# Patient Record
Sex: Female | Born: 1935 | Race: White | Hispanic: No | Smoking: Never smoker
Health system: Southern US, Community
[De-identification: ages and names within clinical notes are randomized; demographics above are authoritative.]

## PROBLEM LIST (undated history)

## (undated) DIAGNOSIS — I219 Acute myocardial infarction, unspecified: Secondary | ICD-10-CM

## (undated) DIAGNOSIS — E039 Hypothyroidism, unspecified: Secondary | ICD-10-CM

## (undated) DIAGNOSIS — I214 Non-ST elevation (NSTEMI) myocardial infarction: Secondary | ICD-10-CM

## (undated) DIAGNOSIS — F419 Anxiety disorder, unspecified: Secondary | ICD-10-CM

## (undated) DIAGNOSIS — E7800 Pure hypercholesterolemia, unspecified: Secondary | ICD-10-CM

## (undated) DIAGNOSIS — K219 Gastro-esophageal reflux disease without esophagitis: Secondary | ICD-10-CM

## (undated) DIAGNOSIS — C4490 Unspecified malignant neoplasm of skin, unspecified: Secondary | ICD-10-CM

## (undated) DIAGNOSIS — I2510 Atherosclerotic heart disease of native coronary artery without angina pectoris: Secondary | ICD-10-CM

## (undated) DIAGNOSIS — M1990 Unspecified osteoarthritis, unspecified site: Secondary | ICD-10-CM

## (undated) DIAGNOSIS — J189 Pneumonia, unspecified organism: Secondary | ICD-10-CM

## (undated) DIAGNOSIS — R0602 Shortness of breath: Secondary | ICD-10-CM

## (undated) DIAGNOSIS — I10 Essential (primary) hypertension: Secondary | ICD-10-CM

## (undated) HISTORY — PX: SKIN CANCER EXCISION: SHX779

## (undated) HISTORY — PX: BREAST BIOPSY: SHX20

## (undated) HISTORY — DX: Non-ST elevation (NSTEMI) myocardial infarction: I21.4

## (undated) HISTORY — PX: CATARACT EXTRACTION W/ INTRAOCULAR LENS  IMPLANT, BILATERAL: SHX1307

## (undated) HISTORY — PX: DILATION AND CURETTAGE OF UTERUS: SHX78

---

## 1998-04-15 ENCOUNTER — Other Ambulatory Visit: Admission: RE | Admit: 1998-04-15 | Discharge: 1998-04-15 | Payer: Self-pay | Admitting: Family Medicine

## 2000-03-01 ENCOUNTER — Other Ambulatory Visit: Admission: RE | Admit: 2000-03-01 | Discharge: 2000-03-01 | Payer: Self-pay | Admitting: General Surgery

## 2001-03-13 ENCOUNTER — Encounter: Admission: RE | Admit: 2001-03-13 | Discharge: 2001-03-13 | Payer: Self-pay | Admitting: Family Medicine

## 2001-03-13 ENCOUNTER — Encounter: Payer: Self-pay | Admitting: Family Medicine

## 2001-11-27 HISTORY — PX: APPENDECTOMY: SHX54

## 2002-09-02 ENCOUNTER — Encounter: Payer: Self-pay | Admitting: General Surgery

## 2002-09-02 ENCOUNTER — Encounter (INDEPENDENT_AMBULATORY_CARE_PROVIDER_SITE_OTHER): Payer: Self-pay | Admitting: Specialist

## 2002-09-02 ENCOUNTER — Inpatient Hospital Stay (HOSPITAL_COMMUNITY): Admission: EM | Admit: 2002-09-02 | Discharge: 2002-09-04 | Payer: Self-pay

## 2008-04-01 ENCOUNTER — Encounter: Admission: RE | Admit: 2008-04-01 | Discharge: 2008-04-01 | Payer: Self-pay | Admitting: Family Medicine

## 2010-11-27 DIAGNOSIS — J189 Pneumonia, unspecified organism: Secondary | ICD-10-CM

## 2010-11-27 HISTORY — DX: Pneumonia, unspecified organism: J18.9

## 2011-04-14 NOTE — Op Note (Signed)
NAME:  Kristin Shaffer, Kristin Shaffer                         ACCOUNT NO.:  [id number]   MEDICAL RECORD NO.:  [id number]                   PATIENT TYPE:  INP   LOCATION:  1823                                 FACILITY:  [facility]   PHYSICIAN:  Sharlet Salina T. Hoxworth, M.D.          DATE OF BIRTH:  Jan 20, 1931   DATE OF PROCEDURE:  09/02/2002  DATE OF DISCHARGE:                                 OPERATIVE REPORT   PREOPERATIVE DIAGNOSIS:  Acute appendicitis.   POSTOPERATIVE DIAGNOSIS:  Acute appendicitis.   SURGICAL PROCEDURE:  Laparoscopic appendectomy.   SURGEON:  Lorne Skeens. Hoxworth, M.D.   ANESTHESIA:  General.   BRIEF HISTORY:  The patient is a 77 year old white female who presents with  24 hours of worsening periumbilical and right lower quadrant abdominal pain.  A CT scan was obtained in the emergency room which confirms acute  appendicitis.  Laparoscopic appendectomy has been recommended and accepted.  The procedure, its indications, risks of bleeding, infection and possible  need for open procedure were discussed and understood preoperatively.  She  is now brought to the operating room for this procedure.   DESCRIPTION OF PROCEDURE:  The patient was brought to the operating room and  placed in the supine position on the operation table and general  endotracheal anesthesia was induced.  She had received preoperative  antibiotics.  The abdomen was sterilely prepped and draped.  Local  anesthesia was used to infiltrate the trocar sites prior to the incision.   A 1-cm incision was made in the umbilicus and dissection was carried down to  the midline fascia.  This was sharply incised for 1 cm and the peritoneum  entered under direct vision.  Using mattress suture of 0 Vicryl, the Hasson  trocar was placed and pneumoperitoneum established.  Under direct vision, a  5-mm trocar was placed in the right upper quadrant and a 12-mm trocar in the  left lower quadrant.  The cecum was visualized and there  were some adhesions  to the lateral abdominal wall.  The base of the appendix could be visualized  and extended around laterally and was acutely inflamed.  The cecum was  mobilized dividing the lateral adhesions.  This allowed mobilization of the  cecum and exposure of the appendix which was acutely inflamed.  It has early  gangrenous changes.  The appendix was carefully bluntly dissected away from  the cecum where it was adhered with inflammatory adhesions and was able to  be elevated and the mesoappendix and the base clearly exposed.  The  mesoappendix was then divided with the harmonic scalpel sequentially down to  the base which was completely freed.  There was minimal inflammation at the  base.  The appendix was divided at its base with a single fire of the Endo-  GIA 3.5-mm stapler with a secure staple line and no evidence of bleeding.  The appendix was placed in an EndoCatch bag and brought  out through the  umbilicus.   Inspection of the trocar sites revealed there was some bleeding from the  left lower quadrant 12-mm trocar.  The trocars were removed and there was  actually a small amount of arterial bleeding probably from the inferior  epigastric or a branch.  Several 0 Vicryl suture were placed through the  abdominal wall and fascial defect with the EndoClose with complete cessation  of the bleeding.  There were a several centimeter hematoma beneath the  peritoneum but this was observed for some time and was stable and there was  no evidence of further bleeding.  The abdomen was suctioned and irrigated  until clear and hemostasis assured.  The rest of the trocars were removed  under direct vision and the C02 evacuated from the peritoneal cavity and the  mattress suture secured at the umbilicus.  The skin incisions were closed  with interrupted subcuticular 4-0 Vicryl and Steri-Strips.  The sponge,  needle and instrument counts were correct.  Dry dressings were applied and  the  patient was taken to the recovery room in good condition.                                               Lorne Skeens. Hoxworth, M.D.    Tory Emerald  D:  09/02/2002  T:  09/03/2002  Job:  [id number]

## 2013-03-20 ENCOUNTER — Encounter (HOSPITAL_COMMUNITY)
Admission: AD | Disposition: A | Discharge status: Home / Self Care | Payer: Self-pay | Source: Ambulatory Visit | Attending: Internal Medicine

## 2013-03-20 ENCOUNTER — Inpatient Hospital Stay (HOSPITAL_COMMUNITY)
Admission: AD | Admit: 2013-03-20 | Discharge: 2013-03-22 | DRG: 247 | Disposition: A | Discharge status: Home / Self Care | Payer: Medicare Other | Source: Ambulatory Visit | Attending: Internal Medicine | Admitting: Internal Medicine

## 2013-03-20 ENCOUNTER — Encounter (HOSPITAL_COMMUNITY): Payer: Self-pay | Admitting: General Practice

## 2013-03-20 DIAGNOSIS — I10 Essential (primary) hypertension: Secondary | ICD-10-CM | POA: Diagnosis present

## 2013-03-20 DIAGNOSIS — Z955 Presence of coronary angioplasty implant and graft: Secondary | ICD-10-CM

## 2013-03-20 DIAGNOSIS — I200 Unstable angina: Secondary | ICD-10-CM | POA: Diagnosis present

## 2013-03-20 DIAGNOSIS — E039 Hypothyroidism, unspecified: Secondary | ICD-10-CM | POA: Diagnosis present

## 2013-03-20 DIAGNOSIS — I214 Non-ST elevation (NSTEMI) myocardial infarction: Secondary | ICD-10-CM | POA: Diagnosis not present

## 2013-03-20 DIAGNOSIS — E876 Hypokalemia: Secondary | ICD-10-CM | POA: Diagnosis present

## 2013-03-20 DIAGNOSIS — K219 Gastro-esophageal reflux disease without esophagitis: Secondary | ICD-10-CM | POA: Diagnosis present

## 2013-03-20 DIAGNOSIS — I160 Hypertensive urgency: Secondary | ICD-10-CM | POA: Diagnosis present

## 2013-03-20 DIAGNOSIS — E785 Hyperlipidemia, unspecified: Secondary | ICD-10-CM | POA: Diagnosis present

## 2013-03-20 HISTORY — DX: Acute myocardial infarction, unspecified: I21.9

## 2013-03-20 HISTORY — DX: Atherosclerotic heart disease of native coronary artery without angina pectoris: I25.10

## 2013-03-20 HISTORY — DX: Non-ST elevation (NSTEMI) myocardial infarction: I21.4

## 2013-03-20 HISTORY — DX: Shortness of breath: R06.02

## 2013-03-20 HISTORY — DX: Pneumonia, unspecified organism: J18.9

## 2013-03-20 HISTORY — DX: Gastro-esophageal reflux disease without esophagitis: K21.9

## 2013-03-20 HISTORY — DX: Pure hypercholesterolemia, unspecified: E78.00

## 2013-03-20 HISTORY — PX: LEFT HEART CATHETERIZATION WITH CORONARY ANGIOGRAM: SHX5451

## 2013-03-20 HISTORY — DX: Anxiety disorder, unspecified: F41.9

## 2013-03-20 HISTORY — DX: Essential (primary) hypertension: I10

## 2013-03-20 HISTORY — DX: Unspecified malignant neoplasm of skin, unspecified: C44.90

## 2013-03-20 HISTORY — PX: CORONARY ANGIOPLASTY WITH STENT PLACEMENT: SHX49

## 2013-03-20 HISTORY — DX: Unspecified osteoarthritis, unspecified site: M19.90

## 2013-03-20 HISTORY — DX: Hypothyroidism, unspecified: E03.9

## 2013-03-20 LAB — BASIC METABOLIC PANEL
BUN: 17 mg/dL (ref 6–23)
Chloride: 102 mEq/L (ref 96–112)
GFR calc Af Amer: 77 mL/min — ABNORMAL LOW (ref >90)
GFR calc non Af Amer: 67 mL/min — ABNORMAL LOW (ref >90)
Glucose, Bld: 98 mg/dL (ref 70–99)
Potassium: 3.6 mEq/L (ref 3.5–5.1)
Sodium: 142 mEq/L (ref 135–145)

## 2013-03-20 LAB — POCT ACTIVATED CLOTTING TIME: Activated Clotting Time: 628 seconds

## 2013-03-20 LAB — TROPONIN I: Troponin I: 1.59 ng/mL (ref <0.30)

## 2013-03-20 LAB — CBC
Platelets: 188 10*3/uL (ref 150–400)
RDW: 13.7 % (ref 11.5–15.5)
WBC: 5.8 10*3/uL (ref 4.0–10.5)

## 2013-03-20 LAB — PROTIME-INR: INR: 0.94 (ref 0.00–1.49)

## 2013-03-20 SURGERY — LEFT HEART CATHETERIZATION WITH CORONARY ANGIOGRAM
Anesthesia: LOCAL

## 2013-03-20 MED ORDER — NITROGLYCERIN IN D5W 200-5 MCG/ML-% IV SOLN
INTRAVENOUS | Status: AC
  Filled 2013-03-20: qty 250

## 2013-03-20 MED ORDER — TICAGRELOR 90 MG PO TABS
ORAL_TABLET | ORAL | Status: AC
  Filled 2013-03-20: qty 2

## 2013-03-20 MED ORDER — SODIUM CHLORIDE 0.9 % IV SOLN: 250.0000 mL | INTRAVENOUS | Status: DC | PRN

## 2013-03-20 MED ORDER — SODIUM CHLORIDE 0.9 % IV SOLN: INTRAVENOUS | Status: DC

## 2013-03-20 MED ORDER — ASPIRIN 300 MG RE SUPP
300.0000 mg | RECTAL | Status: AC
  Filled 2013-03-20: qty 1

## 2013-03-20 MED ORDER — ASPIRIN EC 81 MG PO TBEC
81.0000 mg | DELAYED_RELEASE_TABLET | Freq: Every day | ORAL | Status: DC
  Administered 2013-03-21: 81 mg via ORAL
  Filled 2013-03-20 (×3): qty 1

## 2013-03-20 MED ORDER — LIDOCAINE HCL (PF) 1 % IJ SOLN
INTRAMUSCULAR | Status: AC
  Filled 2013-03-20: qty 30

## 2013-03-20 MED ORDER — POTASSIUM CHLORIDE CRYS ER 20 MEQ PO TBCR
20.0000 meq | EXTENDED_RELEASE_TABLET | Freq: Every day | ORAL | Status: DC
  Administered 2013-03-21: 11:00:00 20 meq via ORAL
  Filled 2013-03-20 (×2): qty 1

## 2013-03-20 MED ORDER — METOPROLOL TARTRATE 12.5 MG HALF TABLET
12.5000 mg | ORAL_TABLET | Freq: Two times a day (BID) | ORAL | Status: DC
  Administered 2013-03-20 – 2013-03-21 (×3): 12.5 mg via ORAL
  Filled 2013-03-20 (×6): qty 1

## 2013-03-20 MED ORDER — PANTOPRAZOLE SODIUM 40 MG PO TBEC
40.0000 mg | DELAYED_RELEASE_TABLET | Freq: Every day | ORAL | Status: DC
  Administered 2013-03-21: 11:00:00 40 mg via ORAL
  Filled 2013-03-20: qty 1

## 2013-03-20 MED ORDER — VERAPAMIL HCL 2.5 MG/ML IV SOLN
INTRAVENOUS | Status: AC
  Filled 2013-03-20: qty 2

## 2013-03-20 MED ORDER — LEVOTHYROXINE SODIUM 88 MCG PO TABS
88.0000 ug | ORAL_TABLET | Freq: Every day | ORAL | Status: DC
  Administered 2013-03-21 – 2013-03-22 (×2): 88 ug via ORAL
  Filled 2013-03-20 (×7): qty 1

## 2013-03-20 MED ORDER — ACETAMINOPHEN 325 MG PO TABS: 650.0000 mg | ORAL_TABLET | ORAL | Status: DC | PRN

## 2013-03-20 MED ORDER — SODIUM CHLORIDE 0.9 % IJ SOLN: 3.0000 mL | INTRAMUSCULAR | Status: DC | PRN

## 2013-03-20 MED ORDER — ASPIRIN 81 MG PO CHEW
324.0000 mg | CHEWABLE_TABLET | ORAL | Status: AC
  Administered 2013-03-20: 324 mg via ORAL
  Filled 2013-03-20: qty 4

## 2013-03-20 MED ORDER — HEPARIN (PORCINE) IN NACL 2-0.9 UNIT/ML-% IJ SOLN
INTRAMUSCULAR | Status: AC
  Filled 2013-03-20: qty 1000

## 2013-03-20 MED ORDER — FENTANYL CITRATE 0.05 MG/ML IJ SOLN
INTRAMUSCULAR | Status: AC
  Filled 2013-03-20: qty 2

## 2013-03-20 MED ORDER — NITROGLYCERIN IN D5W 200-5 MCG/ML-% IV SOLN: 20.0000 ug/min | INTRAVENOUS | Status: DC

## 2013-03-20 MED ORDER — MAGNESIUM OXIDE 250 MG PO TABS: 1.0000 | ORAL_TABLET | Freq: Every day | ORAL | Status: DC

## 2013-03-20 MED ORDER — SODIUM CHLORIDE 0.9 % IV SOLN: 1.0000 mL/kg/h | INTRAVENOUS | Status: AC

## 2013-03-20 MED ORDER — HEPARIN (PORCINE) IN NACL 100-0.45 UNIT/ML-% IJ SOLN
1100.0000 [IU]/h | INTRAMUSCULAR | Status: DC
  Administered 2013-03-20: 1100 [IU]/h via INTRAVENOUS
  Filled 2013-03-20: qty 250

## 2013-03-20 MED ORDER — BIVALIRUDIN 250 MG IV SOLR
INTRAVENOUS | Status: AC
  Filled 2013-03-20: qty 250

## 2013-03-20 MED ORDER — HYDRALAZINE HCL 20 MG/ML IJ SOLN: 10.0000 mg | Freq: Four times a day (QID) | INTRAMUSCULAR | Status: DC | PRN

## 2013-03-20 MED ORDER — SODIUM CHLORIDE 0.9 % IJ SOLN: 3.0000 mL | Freq: Two times a day (BID) | INTRAMUSCULAR | Status: DC

## 2013-03-20 MED ORDER — MIDAZOLAM HCL 2 MG/2ML IJ SOLN
INTRAMUSCULAR | Status: AC
  Filled 2013-03-20: qty 2

## 2013-03-20 MED ORDER — SODIUM CHLORIDE 0.9 % IV SOLN
0.2500 mg/kg/h | INTRAVENOUS | Status: DC
  Filled 2013-03-20: qty 250

## 2013-03-20 MED ORDER — ONDANSETRON HCL 4 MG/2ML IJ SOLN
4.0000 mg | Freq: Four times a day (QID) | INTRAMUSCULAR | Status: DC | PRN
  Administered 2013-03-21: 04:00:00 4 mg via INTRAVENOUS
  Filled 2013-03-20: qty 2

## 2013-03-20 MED ORDER — NITROGLYCERIN IN D5W 200-5 MCG/ML-% IV SOLN
2.0000 ug/min | INTRAVENOUS | Status: DC
  Administered 2013-03-20: 20 ug/min via INTRAVENOUS
  Administered 2013-03-21: 11:00:00 5 ug/min via INTRAVENOUS
  Filled 2013-03-20: qty 250

## 2013-03-20 MED ORDER — ALPRAZOLAM 0.25 MG PO TABS
0.2500 mg | ORAL_TABLET | ORAL | Status: DC | PRN
  Administered 2013-03-20: 21:00:00 0.25 mg via ORAL
  Filled 2013-03-20: qty 1

## 2013-03-20 MED ORDER — MAGNESIUM OXIDE 400 (241.3 MG) MG PO TABS
200.0000 mg | ORAL_TABLET | Freq: Every day | ORAL | Status: DC
  Administered 2013-03-21: 200 mg via ORAL
  Filled 2013-03-20 (×2): qty 0.5

## 2013-03-20 MED ORDER — HEPARIN BOLUS VIA INFUSION
4000.0000 [IU] | Freq: Once | INTRAVENOUS | Status: AC
  Administered 2013-03-20: 4000 [IU] via INTRAVENOUS
  Filled 2013-03-20: qty 4000

## 2013-03-20 MED ORDER — HYDROCHLOROTHIAZIDE 25 MG PO TABS
25.0000 mg | ORAL_TABLET | Freq: Every day | ORAL | Status: DC
  Filled 2013-03-20: qty 1

## 2013-03-20 MED ORDER — TICAGRELOR 90 MG PO TABS
90.0000 mg | ORAL_TABLET | Freq: Two times a day (BID) | ORAL | Status: DC
  Administered 2013-03-20 – 2013-03-21 (×3): 90 mg via ORAL
  Filled 2013-03-20 (×6): qty 1

## 2013-03-20 MED ORDER — ASPIRIN 81 MG PO CHEW: 324.0000 mg | CHEWABLE_TABLET | ORAL | Status: DC

## 2013-03-20 MED ORDER — HEPARIN SODIUM (PORCINE) 1000 UNIT/ML IJ SOLN
INTRAMUSCULAR | Status: AC
  Filled 2013-03-20: qty 1

## 2013-03-20 MED ORDER — SODIUM CHLORIDE 0.9 % IJ SOLN
3.0000 mL | Freq: Two times a day (BID) | INTRAMUSCULAR | Status: DC
  Administered 2013-03-21: 3 mL via INTRAVENOUS

## 2013-03-20 MED ORDER — ATORVASTATIN CALCIUM 20 MG PO TABS
20.0000 mg | ORAL_TABLET | Freq: Every day | ORAL | Status: DC
  Filled 2013-03-20 (×3): qty 1

## 2013-03-20 NOTE — CV Procedure (Addendum)
[facility] PERCUTANEOUS CORONARY INTERVENTION REPORT  NAME:  DHRUVI CRENSHAW   MRN: [id number] DOB:  December 04, 1930   ADMIT DATE: 03/20/2013 Procedure Date: 03/20/2013  INTERVENTIONAL CARDIOLOGIST: Marykay Lex, M.D., MS PRIMARY CARE PROVIDER: Delorse Lek, MD PRIMARY CARDIOLOGIST: Kenneth C. ([place]) Rennis Golden, M.D.  PATIENT:  Kristin Shaffer is a 79 y.o. female with a past medical history significant for hyperlipdemia, hypothyroidism, and apparently new onset hypertension. She was seen 2 days ago by Dr. Doristine Counter for complaints of worsening chest pain with exertion. She reports the chest pain is becoming on and off since Friday. The pain is substernal and occasionally radiates down both arms. The pain was improved with rest. She was referred for cardiac catheterization after being evaluated with Dr. Royann Shivers.  PRE-OPERATIVE DIAGNOSIS:    Unstable Angina   PROCEDURES PERFORMED:    Complex, difficult Percutaneous Coronary Intervention of the long segment of severely diseased mid portion of the RCA using 3 overlapping Xience Expedition Drug-Eluting Stents; 2.75 mm x 18 mm, 3.0 mm 18 mm, 3.0 mm 15 mm  Left Heart Catheterization for hemodynamic measurement.  PROCEDURE:Consent:  Risks of procedure as well as the alternatives and risks of each were explained to the (patient/caregiver).  Consent for procedure obtained. Consent for signed by MD and patient with RN witness -- placed on chart.  PROCEDURE: Time Out: Verified patient identification, verified procedure, site/side was marked, verified correct patient position, special equipment/implants available, medications/allergies/relevent history reviewed, required imaging and test results available.  Performed for new performing physician.  MEDICATIONS:  Total Sedation for PCI and intervention:  2 mg IV Versed, 100 mcg IV fentanyl ;   Omnipaque Contrast: Total for diagnostic and PCI -- 225 ml  Anticoagulation:  IV Heparin  3500 Units (for diagnostic) ; Angiomax Bolus & drip prior to initiating PCI  Anti-Platelet Agent:  Ticagrelor 180 mg  Hemodynamics:  Central Aortic / Mean Pressures: 164/69 mmHg; mean 103 mmHg  Left Ventricular Pressure: 168/9 mmHg, LVEDP 11 mmHg  Left Ventriculography: Not performed to conserve contrast  Coronary Anatomy:  See diagnostic catheterization report by Dr. Royann Shivers.  RCA: Large size, dominant vessel. After the first bend there is a tapering 40-60% stenosis followed by a near total occlusion with a 95-99% stenosis (at the site of the smaller moderate caliber RV marginal branch takeoff) followed by but brief normal segment and then a tandem 95% stenosis after the second bend. Following this vessel normalizes, and remained angiographic normal until it bifurcates distally into the RPDA and the  Right Posterior AV Groove Branch (RPAV).   After review of the diagnostic angiography, it was clear that the tandem lesions in the RCA were the culprit lesions. With the tapering proximal disease as felt necessary to cover this region as well. Due to the extent and length of the stenosed segment, the decision was made to use drug-eluting stents. Overall the procedure was very difficult due to the tilting with guide backup, the proximal stenoses in calcification making it difficult to pass predilatation balloons and stents. Several attempts are made to use buddy wires are unsuccessful and repeat predilatation was required in order to pass the second and third stents beyond the less severe proximal tubular/tapering stenosis.  Percutaneous Coronary Intervention:   the existing sheath was a 6 Field seismologist in the right Radial Artery.  Guide: 6 Fr    JR 4 Guidewire:  Pro-Water; additional wires attentive used for buddy wire were a Luge and an BMW wire.  Predilation Balloon: Emerge   2.0 mm x 20 mm;  multiple inflations beginning from the most distal 95% lesion back to the proximal  tubular stenosis site.  10 Atm x 30  Sec for 2 inflations at each of the 95+ percent stenoses   8 Atm x 30  Sec for 2 inflations in a more proximal segment. Stent #1: Xience Expedition 2.75 mm x 18 mm;   Deployed at 14 Atm x 30 Sec,   Inflated at the ostium into the proximal 95-99% lesion:  12 Atm x 30 Sec  Final distal diameter: 2.9 mm  At this point a second stent, Xience Expedition 3.0 mm x 33 mm was on 6 sec slowly advanced into the mid RCA.  Several wires were attempted to pass through the distal stent to allow for buddy wire support, but were unsuccessful.  The intention was to overlap of the distal stent and cover the entire remaining segment with one single stent. The predilatation balloon was then reinserted and 2 inflations were made: Predilation Balloon: Emerge   2.0 mm x 20 mm  10 Atm x 30  Sec   The 3.0 mm x 33 mm stent was then again unsuccessfully advanced into the RCA. At this time the decision was made to use and noncompliant balloon to post dilate the distal stent and predilated the upstream lesions.   Post/Predilation Balloon #2: [place] Quantum Apex 2.75 mm x 15 mm  12 Atm x 30  Sec -- in the proximal 2/3 of the stent  8 Atm x 30 -- at the focal 95-90% stenosis  At this point a focal dissection was noted at that lesion but not extending proximal or distal.  Another unsuccessful attempt to advance the long 3.0 mm x 33 mm was made. At this point the decision was made to attempt to cover the area with 2 additional overlapping stents.   Stent #2: Xience Expedition 3.0 mm x 18 mm; overlapping the distal stent.  Deployed at 12 Atm x 30  Sec  Advanced to cover the entire overlapping segment - 12 Atm x 30  Sec  Pullback into the stent #2 for post dilation with stent balloon : 14 Atm x 40 Sec,   Stent #3: Xience Expedition 3.0 mm x 15 mm;   Deployed at 12 Atm x 30 Sec,  Stent balloon advanced into the second overlapping segment:  18 Atm x 45 Sec  Post-dilation  Balloon: Empira [place]   3.25 mm x 12 mm;  first 3 inflations are from distal to proximal covering the proximal half of Stent #1 up to Stent #3.  18 Atm x 45  Sec, 20 Atm x 45 Sec (at the proximal 95-99% site); 18 Atm x 30 Sec  2 additional inflations at 18 Atm x 30 followed by a second inflation at  20 Atm x 45 Sec were made to the stented segment.  Final Diameter: Distal stent - 2.9 mm; proximal third of Stent # to Stent #3 - 3.3 mm  Post deployment angiography in multiple views, with and without guidewire in place revealed excellent stent deployment and lesion coverage.  There was no evidence of dissection or perforation.  After completion of PCI, the guide catheter exchanged over a long exchange safety J-wire for the JR 4 catheter that was used to cross Aortic Valve for measurement of Left Ventricular Hemodynamics and Aortic Valve pullback. The catheters and removed via completely out of the body over wire.  The sheath was removed  with a TR band applied at 1600 hours using 16 mL air.  Reverse Allen's test with plethysmography revealed nonocclusive hemostasis.  PATIENT DISPOSITION:    The patient was transferred to the PACU holding area in a hemodynamicaly stable, chest pain free condition.  The patient tolerated the procedure well, and there were no complications.  EBL:   < 15 ml  The patient was stable before, during, and after the procedure.  POST-OPERATIVE DIAGNOSIS:    Successful, difficult/complex Percutaneous Coronary Intervention of the entire mid RCA encompassing a long tubular 40-60% lesion that preceded 2 tandem 95-99% subtotal occlusion lesions using 3 overlapping Xience Expedition Drug Eluting Stents -- 2.75 mm x 18 mm, 3.0 mm x 18 mm, 3.0 mm x 15 mm postdilated in a tapered fashion as described above.    Normal left ventricular pressures.  PLAN OF CARE:  Standard post radial catheterization care.    Will continue IV Angiomax at reduced rate for 3 additional hours post  PCI.   DUE to the mild elevation of troponin would anticipate discharge in 2 days.  Dual Antiplatelet Therapy for a minimum of 1 year. Will begin with Ticagrelor and low-dose aspirin for least the first month.  After this time would consider potentially converting to Plavix.   Marykay Lex, M.D., M.S. [facility] [address] [place], [place]  [address]  [phone]  03/20/2013 4:37 PM

## 2013-03-20 NOTE — H&P (Signed)
[facility]            ADMISSION HISTORY & PHYSICAL   Chief Complaint:  Chest pain, hypertension  Cardiologist: Hilty  Primary Care Physician: Delorse Lek, MD  HPI:  This is a 79 y.o. female with a past medical history significant for hyperlipdemia, hypothyroidism, and apparently new onset hypertension.  She was seen 2 days ago by Dr. Doristine Counter for complaints of worsening chest pain with exertion. She reports the chest pain is becoming on and off since Friday.  The pain is substernal and occasionally radiates down both arms. The pain was improved with rest.  She was referred for evaluation of this as additional treatment for reflux did not improve her symptoms.  PMHx:  Past Medical History  Diagnosis Date  . Hypertension   . GERD (gastroesophageal reflux disease)   . Hypothyroidism   . Pneumonia     hx of pna  . Cancer     hx of skin cancer  . Arthritis     Past Surgical History  Procedure Laterality Date  . Breast biopsy    . Appendectomy  2003    FAMHx:  History reviewed. No pertinent family history.  SOCHx:   reports that she has never smoked. She has never used smokeless tobacco. She reports that she does not drink alcohol or use illicit drugs.  ALLERGIES:  Allergies  Allergen Reactions  . Latex Hives and Rash    ROS: A comprehensive review of systems was negative except for: Cardiovascular: positive for chest pain and fatigue  HOME MEDS: Medications Prior to Admission  Medication Sig Dispense Refill  . acetic acid-hydrocortisone (VOSOL-HC) otic solution Place 3-4 drops into both ears 4 (four) times daily.      Marland Kitchen aspirin 81 MG chewable tablet Chew 81 mg by mouth daily.      Marland Kitchen atorvastatin (LIPITOR) 20 MG tablet Take 20 mg by mouth daily.      . calcium-vitamin D (OSCAL WITH D) 500-200 MG-UNIT per tablet Take 1 tablet by mouth daily.      . hydrochlorothiazide (HYDRODIURIL) 25 MG tablet Take 25 mg by mouth daily.      Marland Kitchen  levothyroxine (SYNTHROID, LEVOTHROID) 88 MCG tablet Take 88 mcg by mouth daily before breakfast.      . Magnesium Oxide 250 MG TABS Take 1 tablet by mouth daily.      Marland Kitchen omeprazole (PRILOSEC) 20 MG capsule Take 20 mg by mouth daily.      . potassium chloride SA (K-DUR,KLOR-CON) 20 MEQ tablet Take 20 mEq by mouth daily.        LABS/IMAGING: Results for orders placed during the hospital encounter of 03/20/13 (from the past 48 hour(s))  CBC     Status: None   Collection Time    03/20/13 11:55 AM      Result Value Range   WBC 5.8  4.0 - 10.5 K/uL   RBC 4.79  3.87 - 5.11 MIL/uL   Hemoglobin 14.7  12.0 - 15.0 g/dL   HCT 96.0  45.4 - 09.8 %   MCV 86.8  78.0 - 100.0 fL   MCH 30.7  26.0 - 34.0 pg   MCHC 35.3  30.0 - 36.0 g/dL   RDW 11.9  14.7 - 82.9 %   Platelets 188  150 - 400 K/uL  TROPONIN I     Status: Abnormal   Collection Time    03/20/13 11:55 AM      Result Value  Range   Troponin I 0.67 (*) <0.30 ng/mL   Comment:            Due to the release kinetics of cTnI,     a negative result within the first hours     of the onset of symptoms does not rule out     myocardial infarction with certainty.     If myocardial infarction is still suspected,     repeat the test at appropriate intervals.     CRITICAL RESULT CALLED TO, READ BACK BY AND VERIFIED WITH:     MILFORD,J RN @ 1257 ON 03/20/13 BY LEONARD,A  BASIC METABOLIC PANEL     Status: Abnormal   Collection Time    03/20/13 11:55 AM      Result Value Range   Sodium 142  135 - 145 mEq/L   Potassium 3.6  3.5 - 5.1 mEq/L   Chloride 102  96 - 112 mEq/L   CO2 30  19 - 32 mEq/L   Glucose, Bld 98  70 - 99 mg/dL   BUN 17  6 - 23 mg/dL   Creatinine, Ser 1.61  0.50 - 1.10 mg/dL   Calcium 09.6 (*) 8.4 - 10.5 mg/dL   GFR calc non Af Amer 67 (*) >90 mL/min   GFR calc Af Amer 77 (*) >90 mL/min   Comment:            The eGFR has been calculated     using the CKD EPI equation.     This calculation has not been     validated in all clinical      situations.     eGFR's persistently     <90 mL/min signify     possible Chronic Kidney Disease.  PROTIME-INR     Status: None   Collection Time    03/20/13 11:55 AM      Result Value Range   Prothrombin Time 12.5  11.6 - 15.2 seconds   INR 0.94  0.00 - 1.49   No results found.  VITALS: Filed Vitals:   03/20/13 1308  BP: 195/77  Pulse: 69  Temp: 97.3 F (36.3 C)  Resp: 17    EXAM: General appearance: alert and no distress Neck: no adenopathy, no carotid bruit, no JVD, supple, symmetrical, trachea midline and thyroid not enlarged, symmetric, no tenderness/mass/nodules Lungs: clear to auscultation bilaterally Heart: regular rate and rhythm, S1, S2 normal, no murmur, click, rub or gallop Abdomen: soft, non-tender; bowel sounds normal; no masses,  no organomegaly Extremities: extremities normal, atraumatic, no cyanosis or edema Pulses: 2+ and symmetric Skin: Skin color, texture, turgor normal. No rashes or lesions Neurologic: Grossly normal  IMPRESSION: Principal Problem:   Hypertensive urgency Active Problems:   Unstable angina   GERD (gastroesophageal reflux disease)   Hypothyroidism 1.   PLAN: 1. Mrs. Ezra was seen by me in the office today and found to be in hypertensive emergency.  Blood pressure was 230/94 initially. She reported having chest pain again this morning.  After brief discussion I recommended hospitalization for better blood pressure control and ultimately feel that she should undergo cardiac catheterization for probable unstable angina.  While awaiting a ride to the hospital in the I will relay this information to my partners but would recommend cardiac catheterization.  Chrystie Nose, MD, [facility] Attending Cardiologist [facility]  HILTY,Kenneth C 03/20/2013, 1:41 PM

## 2013-03-20 NOTE — Brief Op Note (Signed)
03/20/2013  4:21 PM  SURGEONS:  Surgeon(s) and Role:  Thurmon Fair, MD - Primary  Marykay Lex -- Interventional Cardiologist; after Diagnostic Cath Completed by Dr. Royann Shivers.  PROCEDURE:  Procedure(s): LEFT HEART CATHETERIZATION WITH CORONARY ANGIOGRAM (N/A) COMPLEX, DIFFICULT PCI OF EXTENSIVE RCA STENOSES USING 3 Overlapping Xience Expedition DES stents (2.75 mm x 18 mm, 3.0 mm x 18 mm, 3.0 mm x 15 mm - tapered post-dilation 2.9, 3.2, 3.3  PATIENT:  Kristin Shaffer  79 y.o. female seen by Dr. Rennis Golden this AM at [facility] with SSx c/w HTN Urgency & ACS.  Admitted for evaluation & referred for Dx LHC - Performed by Dr. Royann Shivers  PRE-OPERATIVE DIAGNOSIS:NSTEMI  POST-OPERATIVE DIAGNOSIS:    Minimal LCA disease with LAD (1 major Diag), Ramus, and Circumflex (major Lateral OM)  Sequential 95-99% lesions in the early & mid RCA with tapering 60-80% prior to the most proximal lesion.  Hemodynamics: AoP: 164/69 mmHg; mean 103 mmHg;  LVP: 168/9 mmHg, LVEDP 11 mmHg  ANESTHESIA:   local and IV sedation; 2 ml Lidocaine; 2 mg Versed, 100 mcg Fentanyl  EBL:  < 15 ml  Total I/O In: -  Out: 1300 [Urine:1300]  EQUIPMENT: 6 Fr Terumo Glide Sheath-Slender;   Diagnostic: TIG 4.0 - LCA, JR4-RCA & LV Hemodynamics;   PCI - 6 Fr JR4, [place] (also attempted Luge & BMW - unable to pass as buddy wires); 2.0 mm x 20 mm pre-dilation;  Stent #1: Xience Expedition 2.75 mm x 18 mm -- final distal diameter 2.9 mm, proximal 3.2 mm  Post Dilation Balloon - 2.75 mm x 15 mm  Stent # 2: Xience Epedition 3.0 mm x 18 mm -- final diameter 3.3 mm  Stent #3: Xience Expedition 3.0 mm x 15 mm - final diameter 3.30mm  Post-dilation balloon for Final dilation: Empira [place] 3.25 mm x 10mm,  Post PCI Angiography with & without wire in place revealed excellent lesion coverage & stent deployment.  No dissection or perforation.  LOCAL MEDICATIONS USED:  LIDOCAINE    TR BAND:  16 ml Air at 1600 hrs; non-occlusive  hemostasis.  DICTATION: .Note written in EPIC  PLAN OF CARE: Admit to inpatient ; complete 3 more Hrs of IV Angiomax at reduced rate; Increase BP meds & wean IV NTG overnight.  Would continue Brilinta + ASA for at least 1 month, then would consider converting to Plavix.  Will need PPI along with DAPT.  PATIENT DISPOSITION:  PACU - hemodynamically stable.   Delay start of Pharmacological VTE agent (>24hrs) due to surgical blood loss or risk of bleeding: not applicable  HARDING,DAVID W, M.D., M.S. [facility] [address] [place], [place]  [address]  [phone] Pager # [phone] 03/20/2013 4:37 PM

## 2013-03-20 NOTE — Progress Notes (Addendum)
The patient is an 79yo female with history of acid reflux was seen today by Dr. Rennis Golden at [facility].  She presents with progressively worsening CP since Easter and recently noted pain with radiation to the left arm after taking the trash out.  At the office she was also noted to have hypertensive urgency with BP over 200.  See Dr. Blanchie Dessert H&P.  ACS orders written.  IV NTG, heparin, hydralazine IV PRN.  Possible left heart cath today.   Kristin Shaffer  11:32 AM

## 2013-03-20 NOTE — CV Procedure (Signed)
Jaice, Lague Female, 79 y.o., Jul 16, 1931  Location: [facility] OVERFLOW  Bed: 6531-01  MRN: [id number]  CSN: [id number]  Admit Dt: 03/20/13   CARDIAC CATHETERIZATION REPORT   Procedures performed:  1. Left heart catheterization  2. Selective coronary angiography  3. Left ventriculography   Reason for procedure:  Acute Non ST segment elevation myocardial infarction   Procedure performed by: Thurmon Fair, MD, [facility]  Complications: none   Estimated blood loss: less than 5 mL   History:  79 year old woman with unstable angina, mildly elevated cTnI, but normal ECG.  Consent: The risks, benefits, and details of the procedure were explained to the patient. Risks including death, MI, stroke, bleeding, limb ischemia, renal failure and allergy were described and accepted by the patient. Informed written consent was obtained prior to proceeding.  Technique: The patient was brought to the cardiac catheterization laboratory in the fasting state. He was prepped and draped in the usual sterile fashion. Local anesthesia with 1% lidocaine was administered to the right wrist area. Using the modified Seldinger technique a 6 French right radial artery sheath was introduced without difficulty. Under fluoroscopic guidance, using 5 [place] TIG and JRcatheters, selective cannulation of the left coronary artery, right coronary artery and left ventricle were respectively performed. Several coronary angiograms in a variety of projections were recorded. Left ventricular pressure and a pull back to the aorta were recorded. No immediate complications occurred. At the end of the procedure, all catheters were removed. The diagnostic procedure was immediately followed by PCI/stent to the severely stenosed right coronary artery.  Angiographic Findings:  1. The left main coronary artery is free of significant atherosclerosis and bifurcates in the usual fashion into the left anterior descending artery and left circumflex  coronary artery.  2. The left anterior descending artery is a large vessel that reaches the apex and generates twomajor diagonal branches. There is evidence of minor luminal irregularities and no calcification. No hemodynamically meaningful stenoses are seen. 3. The left circumflex coronary artery is a medium-size vessel non dominant vessel that generates two major oblique marginal arteries. There is evidence of mild luminal irregularities and no calcification. No hemodynamically meaningful stenoses are seen. 4. The right coronary artery is a very large-size dominant vessel that generates a very long posterior lateral ventricular system as well as the PDA. There is evidence of extensive luminal irregularities and mild to moderate calcification. There is evidence of severe, hemodynamically meaningful stenoses in the mid portion of the AV groove segment, culminating in a 95% lesion at the acute margin. Beyond this stenosis there is relatively slow flow. There is also collateral filling of the PLA branch from the left coronary system. 5. The left ventricle was not injected to reduce contrast load. There is no aortic valve stenosis by pullback. The left ventricular end-diastolic pressure is 10 mm Hg.    IMPRESSIONS:  Acute coronary syndrome/small NSTEMI due to subtotal occlusion of the mid right coronary artery.  RECOMMENDATION:  Emergency PCI/stent.    Thurmon Fair, MD, [facility] [phone] office [phone] pager

## 2013-03-20 NOTE — Progress Notes (Addendum)
Please also refer to Dr. Blanchie Dessert H&P  79 year old woman without previous cardiovascular illness, but with unusually late onset of HTN, has a 5-year history of chest pain attributed to GERD, with exacerbation in last 5 days. One episode was particularly bad 6 days ago. The pain has both typical and atypical features: retrosternal with left arm radiation, sometimes with physical activity (taking out trash), sometimes positional (lying down at night), sometimes meal related (after eating out). Inconsistently relieved by drinking water.  ECG is repeatedly normal.  Cardiac troponin I is mildly elevated (0.67).  Known to have gallstones by CT 2003 (when she had acute appendicitis).  BP was well controlled two days ago, very high today.  BP 195/77  Pulse 62  Temp(Src) 97.4 F (36.3 C) (Oral)  Resp 17  Ht 5\' 5"  (1.651 m)  Wt 78.926 kg (174 lb)  BMI 28.96 kg/m2   General: Alert, oriented x3, no distress Head: no evidence of trauma, PERRL, EOMI, no exophtalmos or lid lag, no myxedema, no xanthelasma; normal ears, nose and oropharynx Neck: normal jugular venous pulsations and no hepatojugular reflux; brisk carotid pulses without delay and no carotid bruits Chest: clear to auscultation, no signs of consolidation by percussion or palpation, normal fremitus, symmetrical and full respiratory excursions Cardiovascular: normal position and quality of the apical impulse, regular rhythm, normal first and second heart sounds, no murmurs, rubs or gallops Abdomen: no tenderness or distention, no masses by palpation, no abnormal pulsatility or arterial bruits, normal bowel sounds, no hepatosplenomegaly Extremities: no clubbing, cyanosis or edema; 2+ radial, ulnar and brachial pulses bilaterally; 2+ right femoral, posterior tibial and dorsalis pedis pulses; 2+ left femoral, posterior tibial and dorsalis pedis pulsesno subclavian or femoral bruits Neurological: grossly nonfocal.  She appears to have unstable  angina, although some of her chronic complaints may be gastrointestinal in etiology.  Recommend early coronary angiography.  This procedure has been fully reviewed with the patient and written informed consent has been obtained.  Consider concomitant abdominal aortogram to evaluate for secondary HTN due to renal artery stenosis.  Thurmon Fair, MD, [facility] [phone] office [phone] pager

## 2013-03-20 NOTE — Progress Notes (Signed)
ANTICOAGULATION CONSULT NOTE - Initial Consult  Pharmacy Consult for Heparin Indication: chest pain/ACS  Allergies  Allergen Reactions  . Latex Hives and Rash    Patient Measurements: Height: 5\' 5"  (165.1 cm) Weight: 174 lb (78.926 kg) IBW/kg (Calculated) : 57   Vital Signs: Temp: 97.4 F (36.3 C) (04/24 1056) Temp src: Oral (04/24 1056) BP: 204/76 mmHg (04/24 1056) Pulse Rate: 62 (04/24 1056)  Labs: No results found for this basename: HGB, HCT, PLT, APTT, LABPROT, INR, HEPARINUNFRC, CREATININE, CKTOTAL, CKMB, TROPONINI,  in the last 72 hours  CrCl is unknown because no creatinine reading has been taken.   Medical History: Past Medical History  Diagnosis Date  . Hypertension   . GERD (gastroesophageal reflux disease)   . Hypothyroidism   . Pneumonia     hx of pna  . Cancer     hx of skin cancer  . Arthritis     Assessment: 79 year old female with a history of acid reflux admitted with progressively worsening CP since Easter.  To begin heparin with possible cath later today  Goal of Therapy:  Heparin level 0.3-0.7 units/ml Monitor platelets by anticoagulation protocol: Yes   Plan:  1) Heparin 4000 units iv bolus x 1 2) Heparin drip at 1100 units / hr 3) Heparin level 8 hours after heparin begins if not to cath 4) Daily heparin level, CBC  Thank you. Okey Regal, PharmD [phone]  03/20/2013,11:40 AM

## 2013-03-20 NOTE — Progress Notes (Signed)
CRITICAL VALUE ALERT  Critical value received:  Trop 0.67  Date of notification:  03/20/2013  Time of notification:  1300  Critical value read back:yes  Nurse who received alert:  Prince Rome, RN  MD notified (1st page):  Dr. Royann Shivers  Time of first page:    MD notified (2nd page):  Time of second page:  Responding MD:  Dr. Royann Shivers  Time MD responded:  1300

## 2013-03-20 NOTE — H&P (Signed)
duplicate

## 2013-03-21 ENCOUNTER — Other Ambulatory Visit: Payer: Self-pay | Admitting: Physician Assistant

## 2013-03-21 DIAGNOSIS — I214 Non-ST elevation (NSTEMI) myocardial infarction: Secondary | ICD-10-CM | POA: Diagnosis not present

## 2013-03-21 DIAGNOSIS — I160 Hypertensive urgency: Secondary | ICD-10-CM

## 2013-03-21 LAB — LIPID PANEL: LDL Cholesterol: 119 mg/dL — ABNORMAL HIGH (ref 0–99)

## 2013-03-21 LAB — BASIC METABOLIC PANEL
CO2: 25 mEq/L (ref 19–32)
Calcium: 9.5 mg/dL (ref 8.4–10.5)
Glucose, Bld: 128 mg/dL — ABNORMAL HIGH (ref 70–99)
Potassium: 3.3 mEq/L — ABNORMAL LOW (ref 3.5–5.1)
Sodium: 136 mEq/L (ref 135–145)

## 2013-03-21 LAB — CBC
Hemoglobin: 13.3 g/dL (ref 12.0–15.0)
MCH: 30.6 pg (ref 26.0–34.0)
MCV: 85.7 fL (ref 78.0–100.0)
Platelets: 185 10*3/uL (ref 150–400)
RBC: 4.34 MIL/uL (ref 3.87–5.11)
WBC: 11.5 10*3/uL — ABNORMAL HIGH (ref 4.0–10.5)

## 2013-03-21 LAB — PRO B NATRIURETIC PEPTIDE: Pro B Natriuretic peptide (BNP): 528.8 pg/mL — ABNORMAL HIGH (ref 0–450)

## 2013-03-21 MED ORDER — SODIUM CHLORIDE 0.9 % IV SOLN
INTRAVENOUS | Status: DC
  Administered 2013-03-21: 07:00:00 via INTRAVENOUS

## 2013-03-21 MED ORDER — LISINOPRIL 10 MG PO TABS
10.0000 mg | ORAL_TABLET | Freq: Every day | ORAL | Status: DC
  Administered 2013-03-21: 10 mg via ORAL
  Filled 2013-03-21 (×2): qty 1

## 2013-03-21 MED ORDER — FENOFIBRATE 54 MG PO TABS
54.0000 mg | ORAL_TABLET | Freq: Every day | ORAL | Status: DC
  Administered 2013-03-21: 13:00:00 54 mg via ORAL
  Filled 2013-03-21 (×2): qty 1

## 2013-03-21 MED ORDER — HEART ATTACK BOUNCING BOOK
Freq: Once | Status: AC
  Administered 2013-03-21: 02:00:00
  Filled 2013-03-21: qty 1

## 2013-03-21 MED FILL — Sodium Chloride IV Soln 0.9%: INTRAVENOUS | Qty: 50 | Status: AC

## 2013-03-21 NOTE — Clinical Documentation Improvement (Signed)
Hypertension Documentation Clarification Query  THIS DOCUMENT IS NOT A PERMANENT PART OF THE MEDICAL RECORD  TO RESPOND TO THE THIS QUERY, FOLLOW THE INSTRUCTIONS BELOW:  1. If needed, update documentation for the patient's encounter via the notes activity.  2. Access this query again and click edit on the In [employer].  3. After updating, or not, click F2 to complete all highlighted (required) fields concerning your review. Select "additional documentation in the medical record" OR "no additional documentation provided".  4. Click Sign note button.  5. The deficiency will fall out of your In Basket *Please let us know if you are not able to complete this workflow by phone or e-mail (listed below).        03/21/13  Dear Dr. Rennis Golden,    In an effort to better capture your patient's severity of illness, reflect appropriate length of stay and utilization of resources, a review of the patient medical record has revealed the following indicators.    PER CODING GUIDELINES 'HTN EMERGENCY' AND 'HTN URGENCY' ARE REPORTED ONLY AS HTN. IF PATIENT'S CONDITION WARRANTS PLEASE DOCUMENT TO CLARIFY SEVERITY OF ILLNESS. THANK YOU.  Possible Clinical Conditions?  - Hypertension - Accelerated Hypertension - Malignant Hypertension - Other Condition  Supporting Information: - Risk Factors: HTN, GERD, Hx skin cancer - Signs and Symptoms: 4/24: 204/76, 195/76, "hypertensive urgency with BP over 200", NSTEMI requiring PCI/stents - Diagnostics: Positive CE, Cardiac Cath w/PCI/DES stents x3  You may use possible, probable, or suspect with inpatient documentation. Possible, probable, suspected diagnoses MUST be documented at the time of discharge.  Reviewed: additional documentation in the medical record  Thank You,  Beverley Fiedler RN BSN Clinical Documentation Specialist: Tele Contact: [phone]  Health Information Management [place]

## 2013-03-21 NOTE — Progress Notes (Signed)
Utilization Review Completed Julliana Whitmyer J. Emmilia Sowder, RN, BSN, NCM [phone]  

## 2013-03-21 NOTE — Progress Notes (Signed)
CARDIAC REHAB PHASE I   PRE:  Rate/Rhythm: 69SR  BP:  Supine: 118/47  Sitting:   Standing:    SaO2: 94%RA  MODE:  Ambulation: 400 ft   POST:  Rate/Rhythm: 81  BP:  Supine:   Sitting: 148/70  Standing:    SaO2: 98%RA 0750-0915 Pt walked 400 ft on RA with rolling walker and asst x 1. Stopped several times to rest. Denied CP. To recliner after walk. Education completed with pt and family. Discussed CRP 2. Pt will consider. Will refer to [place]. Tolerated activity well.   Luetta Nutting, RN BSN  03/21/2013 9:11 AM

## 2013-03-21 NOTE — Progress Notes (Signed)
PER [employer] ID# [id number] COPAY AT RETAIL PHARMACY IS $42/30 DAY SUPPLY (PREFERRED PHARMACY ANY [facility] ) NON PREFERRED WOULD BE $45/30 DAY SUPPLY - NO PRIOR AUTH REQ'D. Myca Perno J. Lucretia Roers, RN, BSN, [employer] [phone].

## 2013-03-21 NOTE — Progress Notes (Signed)
TR BAND REMOVAL  LOCATION:    right radial  DEFLATED PER PROTOCOL:    yes  TIME BAND OFF / DRESSING APPLIED:    23:00   SITE UPON ARRIVAL:    Level 0  SITE AFTER BAND REMOVAL:    Level 0  REVERSE ALLEN'S TEST:     positive  CIRCULATION SENSATION AND MOVEMENT:    Within Normal Limits   yes  COMMENTS:    

## 2013-03-21 NOTE — Progress Notes (Signed)
[facility]  Subjective: CP resolved.  She reported an episode of SOB and nausea last night.  Zofran given.  Resolved.  Objective: Vital signs in last 24 hours: Temp:  [97.3 F (36.3 C)-98.1 F (36.7 C)] 98 F (36.7 C) (04/25 0500) Pulse Rate:  [60-69] 67 (04/25 0500) Resp:  [17-18] 18 (04/25 0500) BP: (110-204)/(43-93) 141/44 mmHg (04/25 0500) SpO2:  [93 %-99 %] 93 % (04/25 0500) Weight:  [174 lb (78.926 kg)-177 lb 11.1 oz (80.6 kg)] 177 lb 11.1 oz (80.6 kg) (04/25 0500) Last BM Date: 03/19/13  Intake/Output from previous day: 04/24 0701 - 04/25 0700 In: 933.7 [P.O.:240; I.V.:693.7] Out: 1800 [Urine:1800] Intake/Output this shift:    Medications Current Facility-Administered Medications  Medication Dose Route Frequency Provider Last Rate Last Dose  . 0.9 %  sodium chloride infusion  250 mL Intravenous PRN Marykay Lex, MD      . 0.9 %  sodium chloride infusion   Intravenous Continuous Chrystie Nose, MD 10 mL/hr at 03/21/13 0700    . acetaminophen (TYLENOL) tablet 650 mg  650 mg Oral Q4H PRN Wilburt Finlay, PA-C      . ALPRAZolam (XANAX) tablet 0.25 mg  0.25 mg Oral Q4H PRN Thurmon Fair, MD   0.25 mg at 03/20/13 2050  . aspirin EC tablet 81 mg  81 mg Oral Daily Wilburt Finlay, PA-C      . atorvastatin (LIPITOR) tablet 20 mg  20 mg Oral q1800 Wilburt Finlay, PA-C      . bivalirudin (ANGIOMAX) 5 mg/mL in sodium chloride 0.9 % 50 mL infusion  0.25 mg/kg/hr Intravenous Continuous Chrystie Nose, MD   0.25 mg/kg/hr at 03/20/13 1600  . hydrALAZINE (APRESOLINE) injection 10 mg  10 mg Intravenous Q6H PRN Wilburt Finlay, PA-C      . hydrochlorothiazide (HYDRODIURIL) tablet 25 mg  25 mg Oral Daily Wilburt Finlay, PA-C      . levothyroxine (SYNTHROID, LEVOTHROID) tablet 88 mcg  88 mcg Oral QAC breakfast Wilburt Finlay, PA-C      . magnesium oxide (MAG-OX) tablet 200 mg  200 mg Oral Daily Chrystie Nose, MD      . metoprolol tartrate (LOPRESSOR) tablet 12.5 mg   12.5 mg Oral BID Wilburt Finlay, PA-C   12.5 mg at 03/20/13 2050  . nitroGLYCERIN 0.2 mg/mL in dextrose 5 % infusion  20 mcg/min Intravenous Titrated Wilburt Finlay, PA-C      . nitroGLYCERIN 0.2 mg/mL in dextrose 5 % infusion  2-200 mcg/min Intravenous Titrated Marykay Lex, MD 4.5 mL/hr at 03/21/13 0700 15 mcg/min at 03/21/13 0700  . ondansetron (ZOFRAN) injection 4 mg  4 mg Intravenous Q6H PRN Wilburt Finlay, PA-C   4 mg at 03/21/13 0420  . pantoprazole (PROTONIX) EC tablet 40 mg  40 mg Oral Daily Wilburt Finlay, PA-C      . potassium chloride SA (K-DUR,KLOR-CON) CR tablet 20 mEq  20 mEq Oral Daily Wilburt Finlay, PA-C      . sodium chloride 0.9 % injection 3 mL  3 mL Intravenous Q12H Marykay Lex, MD      . sodium chloride 0.9 % injection 3 mL  3 mL Intravenous PRN Marykay Lex, MD      . Ticagrelor [facility]) tablet 90 mg  90 mg Oral BID Marykay Lex, MD   90 mg at 03/20/13 2300    PE: General appearance: alert, cooperative and no distress Neck: no JVD Lungs: Bilateral basilar rales. Heart: regular  rate and rhythm, S1, S2 normal, no murmur, click, rub or gallop ABD:  Nontender, no hepatomegaly.  Non distension. Extremities: No LEE Pulses: 2+ and symmetric Skin: warm and dry.  no ecchymosis or hematoma at right radial cath site. Neurologic: Grossly normal  Lab Results:   Recent Labs  03/20/13 1155 03/21/13 0615  WBC 5.8 11.5*  HGB 14.7 13.3  HCT 41.6 37.2  PLT 188 185   BMET  Recent Labs  03/20/13 1155 03/21/13 0615  NA 142 136  K 3.6 3.3*  CL 102 100  CO2 30 25  GLUCOSE 98 128*  BUN 17 16  CREATININE 0.81 0.81  CALCIUM 10.6* 9.5   PT/INR  Recent Labs  03/20/13 1155  LABPROT 12.5  INR 0.94   Cholesterol  Recent Labs  03/21/13 0615  CHOL 206*   Lipid Panel     Component Value Date/Time   CHOL 206* 03/21/2013 0615   TRIG 177* 03/21/2013 0615   HDL 52 03/21/2013 0615   CHOLHDL 4.0 03/21/2013 0615   VLDL 35 03/21/2013 0615   LDLCALC 119* 03/21/2013 0615    Cardiac Panel (last 3 results)  Recent Labs  03/20/13 1155 03/20/13 1705 03/20/13 2314  TROPONINI 0.67* 1.59* 1.59*    Assessment/Plan  Principal Problem:   NSTEMI (non-ST elevated myocardial infarction) Active Problems:   Unstable angina   Hypertensive urgency   GERD (gastroesophageal reflux disease)   Hypothyroidism  Plan:   CP resolved.  SP left heart cath and PCI with three Xience stents placed in an overlapping fashion in the RCA.   Wean off NTG.  Recommend 2D echo to assess LVF.  We can probably do as an outpatient.  Will add ACE-I for BP.   On asa, brilinta, HCTZ, lipitor, lopressor. Will also add fenofibrate.  Dietary modifications discussed.  Cardiac rehab following.    Checking BNP now.  DCd RUQ ultrasound.    Probably DC tomorrow.   LOS: 1 day    HAGER, BRYAN 03/21/2013 8:54 AM   Patient seen and examined. Agree with assessment and plan. Feels well. No chest pain. S/P stenting to calcified RCA with 3 tandem DES stents. Discussed DAPT.  Agree with initiation of ACE-I, but will DC HCTZ. F/U labs in am and probable DC tomorrow.   Lennette Bihari, MD, [facility] 03/21/2013 9:31 AM

## 2013-03-22 ENCOUNTER — Emergency Department (HOSPITAL_COMMUNITY)
Admission: EM | Admit: 2013-03-22 | Discharge: 2013-03-23 | Disposition: A | Discharge status: Home / Self Care | Payer: Medicare Other | Attending: Emergency Medicine | Admitting: Emergency Medicine

## 2013-03-22 ENCOUNTER — Emergency Department (HOSPITAL_COMMUNITY): Payer: Medicare Other

## 2013-03-22 ENCOUNTER — Encounter (HOSPITAL_COMMUNITY): Payer: Self-pay | Admitting: Emergency Medicine

## 2013-03-22 DIAGNOSIS — E7800 Pure hypercholesterolemia, unspecified: Secondary | ICD-10-CM | POA: Insufficient documentation

## 2013-03-22 DIAGNOSIS — Z8709 Personal history of other diseases of the respiratory system: Secondary | ICD-10-CM | POA: Insufficient documentation

## 2013-03-22 DIAGNOSIS — Z7982 Long term (current) use of aspirin: Secondary | ICD-10-CM | POA: Insufficient documentation

## 2013-03-22 DIAGNOSIS — F411 Generalized anxiety disorder: Secondary | ICD-10-CM | POA: Insufficient documentation

## 2013-03-22 DIAGNOSIS — Z9861 Coronary angioplasty status: Secondary | ICD-10-CM | POA: Insufficient documentation

## 2013-03-22 DIAGNOSIS — R0789 Other chest pain: Secondary | ICD-10-CM | POA: Insufficient documentation

## 2013-03-22 DIAGNOSIS — I252 Old myocardial infarction: Secondary | ICD-10-CM | POA: Insufficient documentation

## 2013-03-22 DIAGNOSIS — Z85828 Personal history of other malignant neoplasm of skin: Secondary | ICD-10-CM | POA: Insufficient documentation

## 2013-03-22 DIAGNOSIS — Z8701 Personal history of pneumonia (recurrent): Secondary | ICD-10-CM | POA: Insufficient documentation

## 2013-03-22 DIAGNOSIS — R079 Chest pain, unspecified: Secondary | ICD-10-CM

## 2013-03-22 DIAGNOSIS — I2510 Atherosclerotic heart disease of native coronary artery without angina pectoris: Secondary | ICD-10-CM | POA: Insufficient documentation

## 2013-03-22 DIAGNOSIS — I10 Essential (primary) hypertension: Secondary | ICD-10-CM | POA: Insufficient documentation

## 2013-03-22 DIAGNOSIS — Z79899 Other long term (current) drug therapy: Secondary | ICD-10-CM | POA: Insufficient documentation

## 2013-03-22 DIAGNOSIS — K219 Gastro-esophageal reflux disease without esophagitis: Secondary | ICD-10-CM | POA: Insufficient documentation

## 2013-03-22 DIAGNOSIS — Z955 Presence of coronary angioplasty implant and graft: Secondary | ICD-10-CM

## 2013-03-22 DIAGNOSIS — E039 Hypothyroidism, unspecified: Secondary | ICD-10-CM | POA: Insufficient documentation

## 2013-03-22 LAB — CBC
HCT: 38.2 % (ref 36.0–46.0)
Hemoglobin: 13.4 g/dL (ref 12.0–15.0)
MCHC: 35.1 g/dL (ref 30.0–36.0)
MCV: 88 fL (ref 78.0–100.0)
Platelets: 177 10*3/uL (ref 150–400)
RBC: 4.42 MIL/uL (ref 3.87–5.11)
RBC: 4.34 MIL/uL (ref 3.87–5.11)
RDW: 14.2 % (ref 11.5–15.5)
WBC: 7.3 10*3/uL (ref 4.0–10.5)
WBC: 8.4 10*3/uL (ref 4.0–10.5)

## 2013-03-22 LAB — COMPREHENSIVE METABOLIC PANEL
ALT: 17 U/L (ref 0–35)
Alkaline Phosphatase: 103 U/L (ref 39–117)
BUN: 26 mg/dL — ABNORMAL HIGH (ref 6–23)
CO2: 26 mEq/L (ref 19–32)
Chloride: 105 mEq/L (ref 96–112)
GFR calc Af Amer: 61 mL/min — ABNORMAL LOW (ref >90)
Glucose, Bld: 105 mg/dL — ABNORMAL HIGH (ref 70–99)
Potassium: 4.3 mEq/L (ref 3.5–5.1)
Sodium: 140 mEq/L (ref 135–145)
Total Bilirubin: 0.6 mg/dL (ref 0.3–1.2)

## 2013-03-22 LAB — POCT I-STAT TROPONIN I: Troponin i, poc: 0.66 ng/mL (ref 0.00–0.08)

## 2013-03-22 MED ORDER — FENOFIBRATE 54 MG PO TABS: 54.0000 mg | ORAL_TABLET | Freq: Every day | ORAL | Status: DC

## 2013-03-22 MED ORDER — LISINOPRIL 10 MG PO TABS: 10.0000 mg | ORAL_TABLET | Freq: Every day | ORAL | Status: DC

## 2013-03-22 MED ORDER — TICAGRELOR 90 MG PO TABS: 90.0000 mg | ORAL_TABLET | Freq: Two times a day (BID) | ORAL | Status: DC

## 2013-03-22 MED ORDER — METOPROLOL TARTRATE 12.5 MG HALF TABLET: 12.5000 mg | ORAL_TABLET | Freq: Two times a day (BID) | ORAL | Status: DC

## 2013-03-22 NOTE — ED Notes (Addendum)
Called in i-Stat troponin results in to Dr. Effie Shy 0.66 ng/mL

## 2013-03-22 NOTE — ED Notes (Addendum)
Nito SL X 2. Pain relieved after 2nd nitro. B\P 150/90.

## 2013-03-22 NOTE — ED Notes (Signed)
EMS called out to [facility]. Pt. Was DC home from here today. She had three stents placed. She was supposed to be sent home with SL nitro but didn't get RX. She started having 10/10 CP and called EMS. 12 lead WNL.

## 2013-03-22 NOTE — Discharge Summary (Signed)
Physician Discharge Summary  Patient ID: Kristin Shaffer MRN: [id number] DOB/AGE: [birth year] 79 y.o.  Admit date: 03/20/2013 Discharge date: 03/22/2013  Admission Diagnoses: NSTEMI  Discharge Diagnoses:  Principal Problem:   NSTEMI (non-ST elevated myocardial infarction) Active Problems:   Unstable angina   Hypertensive urgency -Accelerated Hypertension   Presence of drug coated stent in right coronary artery   Hypothyroidism   GERD (gastroesophageal reflux disease)   Discharged Condition: stable  Hospital Course: The patient is an 79 y.o. female with a past medical history significant for hyperlipidemia and hypothyroidism, who was referred to [facility], by Dr. Doristine Counter for unstable angina. She was seen in clinic on 4/24 by Dr. Rennis Golden. In the office, she was found to be in hypertensive emergency with a blood pressure of 230/94. Dr Rennis Golden had recommended hospitalization for better blood pressure control as well as a cardiac catheterization for unstable angina. She was admitted directly from the office to [facility]. She was placed on IV NTG, heparin and PRN IV hydralazine. Her EKG was normal. Cardiac troponin was mildly elevated at 0.67. Once her blood pressure stabilized, she underwent coronary angiography. The diagnostic portion was performed by Dr. Royann Shivers. It revealed subtotal occlusion of the mid right coronary artery. It was then decided to proceed with PCI, which was performed by Dr. Herbie Baltimore. She underwent successful, difficult/complex PCI of the entire mid RCA encompassing a long tubular 40-60% lesion that preceded 2 tandem 95-99% subtotal occlusion lesions using 3 overlapping Xience Expedition Drug Eluting Stents. She left the cath lab in stable condition. She was place on DAPT with ASA and Brilinta. Her chest pain resolved. She had no post-operative complications. The patient did endorse, however, mild dyspnea after starting Brilinta and Dr. Herbie Baltimore felt that that could have been related. He informed  the patient that if it continued to be problematic, then the Brilinta could be replaced with Plavix. On hospital day 2, she was seen and examined by Dr. Herbie Baltimore. Her blood pressure was stable and she had no further chest pain. Dr. Herbie Baltimore felt that she was stable for discharge home. She was ordered to discontinue taking her HCTZ. She was placed on an ACE-I and low dose BB. She is schedules to follow up with Wilburt Finlay, PA-C at [facility] on 04/02/13 and will follow up with Dr. Rennis Golden in 1-2 months. She was instructed to get a BMET prior to her hospital follow-up.   Consults: None  Significant Diagnostic Studies:   LHC POST-OPERATIVE DIAGNOSIS:  Minimal LCA disease with LAD (1 major Diag), Ramus, and Circumflex (major Lateral OM)  Sequential 95-99% lesions in the early & mid RCA with tapering 60-80% prior to the most proximal lesion.  Hemodynamics: AoP: 164/69 mmHg; mean 103 mmHg; LVP: 168/9 mmHg, LVEDP 11 mmHg  EQUIPMENT: 6 Fr Terumo Glide Sheath-Slender;  Diagnostic: TIG 4.0 - LCA, JR4-RCA & LV Hemodynamics;  PCI - 6 Fr JR4, [place] (also attempted Luge & BMW - unable to pass as buddy wires); 2.0 mm x 20 mm pre-dilation;  Stent #1: Xience Expedition 2.75 mm x 18 mm -- final distal diameter 2.9 mm, proximal 3.2 mm  Post Dilation Balloon - 2.75 mm x 15 mm Stent # 2: Xience Epedition 3.0 mm x 18 mm -- final diameter 3.3 mm  Stent #3: Xience Expedition 3.0 mm x 15 mm - final diameter 3.22mm  Post-dilation balloon for Final dilation: Empira [place] 3.25 mm x 10mm, Post PCI Angiography with & without wire in place revealed excellent lesion coverage & stent deployment.  No dissection or perforation  PLAN OF CARE: Admit to inpatient ; complete 3 more Hrs of IV Angiomax at reduced rate; Increase BP meds & wean IV NTG overnight.  Would continue Brilinta + ASA for at least 1 month, then would consider converting to Plavix.  Will need PPI along with DAPT.   Treatments: See Hospital Course  Discharge  Exam: Blood pressure 127/38, pulse 59, temperature 97.5 F (36.4 C), temperature source Oral, resp. rate 18, height 5\' 5"  (1.651 m), weight 82.8 kg (182 lb 8.7 oz), SpO2 96.00%.   Disposition: 01-Home or Self Care      Discharge Orders   Future Orders Complete By Expires     Amb Referral to Cardiac Rehabilitation  As directed     Diet - low sodium heart healthy  As directed     Increase activity slowly  As directed     Walker rolling  As directed         Med List    Warning      Cannot display patient medications because the patient has not yet arrived.        Follow-up Information   Follow up with HAGER, BRYAN, PA-C On 04/02/2013. (10:00 am)    Contact information:   [address] [place] [address] [phone]      TIME SPENT ON DISCHARGE, INCLUDING PHYSICIAN TIME: > 30 MINUTES  Signed: Allayne Butcher, PA-C 03/22/2013, 10:43 PM  I saw the patient his AM along with Boyce Medici,, PA. I agree with her discharge summary.  Admitted for Accelerated Hypertension likely due to Acute Coronary Syndrome -- initially considered Unstable Angina, but actually did rule in for NSTEMI.   Taken urgently to that cath lab & found to have extensive severe CAD in the RCA - complex PCI of mid RCA with 3 overlapping Xience Expedition DES stents.  BP medications adjusted.    She has been ambulating in the hallway without any difficulty.  She is ready for discharge.   I suspect that the gasping Dyspnea may well be related to Brilinta -- would like to see if it resolves over the next week or so, but if it continues to be problematic, can convert to Plavix.   Have d/c'd HCTZ in exchange for ACE-I & low dose BB. BP & HR stable.   On statin. 3  Has had ~ hypokalemia -- suspect related to HCTZ. Will order BMP prior to f/u to ensure stability & to decide if she needs to continue PO supplementation.  [facility] has seen -- recommend Rolling Walker -- with Rx.    Will arrange f/u with Mr. Leron Croak next week, then with Dr. Rennis Golden in ~1-2 months.   Marykay Lex, M.D., M.S. [facility] [address] [place], [place]  [address]  [phone] Pager # [phone] 03/22/2013 10:45 PM

## 2013-03-22 NOTE — Progress Notes (Signed)
CARDIAC REHAB PHASE I   PRE:  Rate/Rhythm: Sinus 68 BP:  Supine:153/38       SaO2: 97% Room Air  MODE:  Ambulation: 400 ft   POST:  Rate/Rhythem: Sinus 72  BP:    Sitting: 132/43    SaO2:97% Room Air [phone] Patient ambulated in the hallway using rolling walker. Patient complained of mild shortness of breath with resolved after rest. Patient tolerated the remainder of the walk without difficulty. Ms. Favorite would like a rolling walker upon discharge.  Harlon Flor, Arta Bruce

## 2013-03-22 NOTE — Progress Notes (Addendum)
Subjective:  No further CP.  Episodic "gasping" for air / "can't catch breath" Just waiting for walking this AM.  Has been using rolling walker.  No CP or SOB with ambulation.  Objective:  Vital Signs in the last 24 hours: Temp:  [97.5 F (36.4 C)-97.9 F (36.6 C)] 97.5 F (36.4 C) (04/26 0740) Pulse Rate:  [59-74] 59 (04/26 0740) Resp:  [17-18] 18 (04/26 0740) BP: (95-147)/(38-70) 127/38 mmHg (04/26 0740) SpO2:  [93 %-98 %] 96 % (04/26 0740) Weight:  [82.8 kg (182 lb 8.7 oz)] 82.8 kg (182 lb 8.7 oz) (04/26 0000)  Intake/Output from previous day: 04/25 0701 - 04/26 0700 In: 320 [P.O.:320] Out: 400 [Urine:400] Intake/Output from this shift:    Physical Exam: General appearance: alert, cooperative, appears stated age and no distress Neck: no adenopathy, no carotid bruit and no JVD Lungs: clear to auscultation bilaterally, normal percussion bilaterally and non-labored Heart: regular rate and rhythm, S1, S2 normal, no murmur, click, rub or gallop Abdomen: soft, non-tender; bowel sounds normal; no masses,  no organomegaly Extremities: extremities normal, atraumatic, no cyanosis or edema Pulses: 2+ and symmetric Skin: mild diffuse bruising; R wrist cath site has small amounto of bruise; normal reverse Allen's Neurologic: Grossly normal   Lab Results:  Recent Labs  03/21/13 0615 03/22/13 0700  WBC 11.5* 7.3  HGB 13.3 13.4  PLT 185 177    Recent Labs  03/20/13 1155 03/21/13 0615  NA 142 136  K 3.6 3.3*  CL 102 100  CO2 30 25  GLUCOSE 98 128*  BUN 17 16  CREATININE 0.81 0.81    Recent Labs  03/20/13 1705 03/20/13 2314  TROPONINI 1.59* 1.59*   Hepatic Function Panel No results found for this basename: PROT, ALBUMIN, AST, ALT, ALKPHOS, BILITOT, BILIDIR, IBILI,  in the last 72 hours  Recent Labs  03/21/13 0615  CHOL 206*   No results found for this basename: PROTIME,  in the last 72 hours  Cardiac Studies: Plan OP Echo  Assessment/Plan:  Principal  Problem:   NSTEMI (non-ST elevated myocardial infarction) Active Problems:   Unstable angina   Hypertensive urgency   Presence of drug coated stent in right coronary artery - mid RCA Xience Expedition 2.75 x 18, 3.0 x 18, 3.0 x 15   Hypothyroidism   GERD (gastroesophageal reflux disease)  Looks great post PCI for NSTEMI.  I suspect that the gasping Dyspnea may well be related to Brilinta -- would like to see if it resolves over the next week or so, but if it continues to be problematic, can convert to Plavix.  Have d/c'd HCTZ in exchange for ACE-I & low dose BB.  BP & HR stable. On statin.  [facility] has seen -- recommend Rolling Walker -- with Rx.  Will arrange f/u with Mr. Leron Croak next week, then with Dr. Rennis Golden in ~1-2 months.  Has had ~ hypokalemia -- suspect related to HCTZ.  Will order BMP prior to f/u to ensure stability & to decide if she needs to continue PO supplementation.    LOS: 2 days   20 min with patient.  Sofi Bryars W 03/22/2013, 9:33 AM

## 2013-03-22 NOTE — ED Notes (Signed)
Pt is painfree and has no complaint.

## 2013-03-23 MED ORDER — NITROGLYCERIN 0.4 MG SL SUBL: 0.4000 mg | SUBLINGUAL_TABLET | SUBLINGUAL | Status: DC | PRN

## 2013-03-23 MED ORDER — NITROGLYCERIN 0.4 MG SL SUBL
0.4000 mg | SUBLINGUAL_TABLET | SUBLINGUAL | Status: DC | PRN
Start: 2013-03-23 — End: 2013-03-23
  Filled 2013-03-23: qty 25

## 2013-03-23 NOTE — ED Provider Notes (Signed)
History     CSN: [id number]  Arrival date & time 03/22/13  1478   First MD Initiated Contact with Patient 03/22/13 2122      Chief Complaint  Patient presents with  . Chest Pain    (Consider location/radiation/quality/duration/timing/severity/associated sxs/prior treatment) HPI Comments: Kristin Shaffer is a 79 y.o. Female who states that she was sitting in a recliner, at home today, when she developed left anterior chest discomfort. The discomfort was both sharp and burning in nature. It was 8/10. The discomfort came on suddenly. It began to abate within 15 minutes. An ambulance was called. They advised her to take aspirin, which she did. EMS arrived and administered one sublingual nitroglycerin with some slight improvement. She received a second nitroglycerin during transport, and her pain completely resolved, on arrival to the emergency department. She was discharged from the hospital, today, after treatment for hypertensive urgency, and acute coronary occlusion. He had a non-ST segment elevation MI. She was supposed to have nitroglycerin tablets. He is at home, but was not given a prescription for them. She denies fever, chills, cough, weakness, or dizziness. She reports to have been eating today, since hospital discharge. There are no known modifying factors.  Patient is a 79 y.o. female presenting with chest pain. The history is provided by the patient.  Chest Pain   Past Medical History  Diagnosis Date  . Hypertension   . GERD (gastroesophageal reflux disease)   . Hypothyroidism   . Skin cancer     "both legs; right arm" (03/20/2013)  . High cholesterol   . Anginal pain   . Coronary artery disease   . Myocardial infarction     "dr's saw evidence I might have had a heart attack" (03/20/2013)  . Pneumonia 2012  . Exertional shortness of breath   . Arthritis     "different places; not bad" (03/20/2013)  . Anxiety     Past Surgical History  Procedure Laterality Date  .  Coronary angioplasty with stent placement  03/20/2013    "3; total is 3" (03/20/2013)  . Appendectomy  2003  . Dilation and curettage of uterus  1956?  Marland Kitchen Cataract extraction w/ intraocular lens  implant, bilateral Bilateral ~ 2008  . Breast biopsy Bilateral     "total of 5; 2 on one side, 3 on the other; all benign" (03/20/2013)  . Skin cancer excision      "1 off right arm; 2 off each leg" (03/20/2013)    No family history on file.  History  Substance Use Topics  . Smoking status: Never Smoker   . Smokeless tobacco: Never Used  . Alcohol Use: No    OB History   Grav Para Term Preterm Abortions TAB SAB Ect Mult Living                  Review of Systems  Cardiovascular: Positive for chest pain.  All other systems reviewed and are negative.    Allergies  Latex  Home Medications   Current Outpatient Rx  Name  Route  Sig  Dispense  Refill  . acetic acid-hydrocortisone (VOSOL-HC) otic solution   Both Ears   Place 3-4 drops into both ears 4 (four) times daily.         Marland Kitchen aspirin 81 MG chewable tablet   Oral   Chew 81 mg by mouth daily.         Marland Kitchen atorvastatin (LIPITOR) 20 MG tablet   Oral   Take 20  mg by mouth daily.         . calcium-vitamin D (OSCAL WITH D) 500-200 MG-UNIT per tablet   Oral   Take 1 tablet by mouth daily.         . fenofibrate 54 MG tablet   Oral   Take 1 tablet (54 mg total) by mouth daily.   30 tablet   5   . levothyroxine (SYNTHROID, LEVOTHROID) 88 MCG tablet   Oral   Take 88 mcg by mouth daily before breakfast.         . lisinopril (PRINIVIL,ZESTRIL) 10 MG tablet   Oral   Take 1 tablet (10 mg total) by mouth daily.   30 tablet   5   . Magnesium Oxide 250 MG TABS   Oral   Take 1 tablet by mouth daily.         . metoprolol tartrate (LOPRESSOR) 12.5 mg TABS   Oral   Take 0.5 tablets (12.5 mg total) by mouth 2 (two) times daily.   60 tablet   5   . omeprazole (PRILOSEC) 20 MG capsule   Oral   Take 20 mg by mouth  daily.         . potassium chloride SA (K-DUR,KLOR-CON) 20 MEQ tablet   Oral   Take 20 mEq by mouth daily.         . Ticagrelor (BRILINTA) 90 MG TABS tablet   Oral   Take 1 tablet (90 mg total) by mouth 2 (two) times daily.   60 tablet   10   . nitroGLYCERIN (NITROSTAT) 0.4 MG SL tablet   Sublingual   Place 1 tablet (0.4 mg total) under the tongue every 5 (five) minutes as needed for chest pain.   30 tablet   0     BP 156/93  Pulse 61  Temp(Src) 97.1 F (36.2 C) (Oral)  Resp 14  SpO2 92%  Physical Exam  Nursing note and vitals reviewed. Constitutional: She is oriented to person, place, and time. She appears well-developed and well-nourished.  HENT:  Head: Normocephalic and atraumatic.  Eyes: Conjunctivae and EOM are normal. Pupils are equal, round, and reactive to light.  Neck: Normal range of motion and phonation normal. Neck supple.  Cardiovascular: Normal rate, regular rhythm and intact distal pulses.   Pulmonary/Chest: Effort normal and breath sounds normal. She exhibits no tenderness.  Abdominal: Soft. She exhibits no distension. There is no tenderness. There is no guarding.  Musculoskeletal: Normal range of motion.  Neurological: She is alert and oriented to person, place, and time. She has normal strength. She exhibits normal muscle tone.  Skin: Skin is warm and dry.  Psychiatric: She has a normal mood and affect. Her behavior is normal. Judgment and thought content normal.    ED Course  Procedures (including critical care time)  Medications  nitroGLYCERIN (NITROSTAT) SL tablet 0.4 mg (not administered)   Consultation: 23:22- case discussed with Dr. Tresa Endo, via, his PA. Dr. Tresa Endo, was informed about the findings and is comfortable with the patient being discharged; with the previously recommended treatment.  At the request of family members, the patient was given a vial of nitroglycerin sublingual, to use at home, if needed.    Date: 09/13/2012  Rate:  63  Rhythm: normal sinus rhythm  QRS Axis: normal  PR and QT Intervals: normal  ST/T Wave abnormalities: normal  PR and QRS Conduction Disutrbances:none  Narrative Interpretation:   Old EKG Reviewed: unchanged    Labs Reviewed  COMPREHENSIVE METABOLIC PANEL - Abnormal; Notable for the following:    Glucose, Bld 105 (*)    BUN 26 (*)    AST 46 (*)    GFR calc non Af Amer 53 (*)    GFR calc Af Amer 61 (*)    All other components within normal limits  POCT I-STAT TROPONIN I - Abnormal; Notable for the following:    Troponin i, poc 0.66 (*)    All other components within normal limits  CBC   Dg Chest Portable 1 View  03/22/2013  *RADIOLOGY REPORT*  Clinical Data: Left-sided chest pain  PORTABLE CHEST - 1 VIEW  Comparison: 04/07/2010  Findings: Cardiac leads overlie the chest.  Heart size is normal. Lung volumes are low but clear allowing for crowding presumably related to hypoaeration.  No pleural effusion.  No acute osseous finding.  IMPRESSION: Suboptimal lung volumes with crowding of the bronchovascular markings but no acute focal process identified.   Original Report Authenticated By: Christiana Pellant, M.D.      1. Chest pain, unspecified       MDM  Nonspecific chest pain. Troponin elevation is due to a downward trend from recent NSTEMI. Doubt ACS, PE, pneumonia. The patient is stable for discharge.   Nursing Notes Reviewed/ Care Coordinated, and agree without changes. Applicable Imaging Reviewed.  Interpretation of Laboratory Data incorporated into ED treatment    Plan: Home Medications- Usual; Home Treatments- rest; Recommended follow up- PCP prn       Flint Melter, MD 03/23/13 0201

## 2013-03-23 NOTE — ED Notes (Signed)
Pt discharged.Vital signs stable.Pt refused for BP measurement.

## 2013-03-23 NOTE — ED Notes (Signed)
Pt refuses for BP measurement.

## 2013-03-27 HISTORY — PX: TRANSTHORACIC ECHOCARDIOGRAM: SHX275

## 2013-03-31 ENCOUNTER — Ambulatory Visit (HOSPITAL_COMMUNITY): Payer: Medicare Other

## 2013-04-01 ENCOUNTER — Ambulatory Visit (HOSPITAL_COMMUNITY)
Admission: RE | Admit: 2013-04-01 | Discharge: 2013-04-01 | Disposition: A | Discharge status: Home / Self Care | Payer: Medicare Other | Source: Ambulatory Visit | Attending: Cardiovascular Disease | Admitting: Cardiovascular Disease

## 2013-04-01 DIAGNOSIS — I219 Acute myocardial infarction, unspecified: Secondary | ICD-10-CM | POA: Insufficient documentation

## 2013-04-01 DIAGNOSIS — I2510 Atherosclerotic heart disease of native coronary artery without angina pectoris: Secondary | ICD-10-CM | POA: Insufficient documentation

## 2013-04-01 DIAGNOSIS — I160 Hypertensive urgency: Secondary | ICD-10-CM

## 2013-04-01 DIAGNOSIS — E785 Hyperlipidemia, unspecified: Secondary | ICD-10-CM | POA: Insufficient documentation

## 2013-04-01 DIAGNOSIS — I10 Essential (primary) hypertension: Secondary | ICD-10-CM | POA: Insufficient documentation

## 2013-04-01 DIAGNOSIS — I214 Non-ST elevation (NSTEMI) myocardial infarction: Secondary | ICD-10-CM

## 2013-04-01 DIAGNOSIS — K219 Gastro-esophageal reflux disease without esophagitis: Secondary | ICD-10-CM | POA: Insufficient documentation

## 2013-04-01 NOTE — Progress Notes (Signed)
Renal artery duplex doppler was completed. Clarisa Danser RVT 

## 2013-04-01 NOTE — Progress Notes (Signed)
[facility]   2D echo completed 04/01/2013.   Cindy Kallin Henk, RDCS  

## 2013-04-10 ENCOUNTER — Telehealth: Payer: Self-pay | Admitting: Internal Medicine

## 2013-04-11 ENCOUNTER — Telehealth: Payer: Self-pay | Admitting: Internal Medicine

## 2013-04-11 NOTE — Telephone Encounter (Signed)
Kristin Shaffer has questions about what she can do after having her stents placed 3 weeks ago yesterday . She wants to know can she drive, can she climb her stairs, light housework etc. Instructions was not to do anything for 3 weeks. Please call her @ [phone].   Thanks

## 2013-04-11 NOTE — Telephone Encounter (Signed)
I  Contacted her an reviewed her imaging study results on 04/11/13 at 7:06 pm. She can resume activities.  -[place]

## 2013-04-14 ENCOUNTER — Telehealth: Payer: Self-pay | Admitting: Internal Medicine

## 2013-04-14 NOTE — Telephone Encounter (Signed)
Returned call.  Pt with concerns about dosages for fenofibrate and atorvastatin, which were clarified for pt.  Pt also stated her pt information sheet for fenofibrate states NOT to take it with atorvastatin and pt wants to know if Dr. Rennis Golden wants her to take these medications together.  Stated she talked to the pharmacist today and he said it seems like it would be okay, but if pt had any questions to contact her doctor.  Pt informed Dr. Rennis Golden will be notified.  Pt verbalized understanding and agreed w/ plan.

## 2013-04-14 NOTE — Telephone Encounter (Signed)
We have tons of people that take lipitor and fenobrate together .Marland Kitchen No problems. Please re-assure her.  I agree with the pharmacist.  -[place]

## 2013-04-14 NOTE — Telephone Encounter (Signed)
Call to pt and reassured per Dr. Rennis Golden.  Pt verbalized understanding and agreed w/ plan.

## 2013-04-14 NOTE — Telephone Encounter (Signed)
Patient states that she is taking Lipitor 20 mg and has been given Fenofibride 54 mg.  The warning label states not to take with Lipitor.  Please advise.

## 2013-04-18 ENCOUNTER — Telehealth: Payer: Self-pay | Admitting: *Deleted

## 2013-04-18 MED ORDER — TICAGRELOR 90 MG PO TABS: 90.0000 mg | ORAL_TABLET | Freq: Two times a day (BID) | ORAL | Status: DC

## 2013-04-18 NOTE — Telephone Encounter (Signed)
Informed by Kristin Shaffer, operator, that pt concerned about refills.  Call to pt and informed RN will need to review her paper chart as per Epic, she has 10 refills on Brilinta.  Pt also informed 2-wk supply of samples has been left at the front desk for her to pick up in the meantime.  RN will investigate further and call pt back when more information available.

## 2013-04-18 NOTE — Telephone Encounter (Signed)
Reviewed paper chart and Epic.  Pt was given a printed rx for brilinta.  Pharmacy faxed request for refills w/ MD signature.  Will send E-Rx now.  Call to pt and no answer.    Call to pt and informed rx sent.  Pt no longer needs samples and will return.

## 2013-05-20 ENCOUNTER — Other Ambulatory Visit (HOSPITAL_COMMUNITY): Payer: Self-pay | Admitting: Internal Medicine

## 2013-05-20 MED ORDER — POTASSIUM CHLORIDE CRYS ER 20 MEQ PO TBCR: 20.0000 meq | EXTENDED_RELEASE_TABLET | Freq: Every day | ORAL | Status: DC

## 2013-05-20 NOTE — Telephone Encounter (Signed)
Rx was sent to pharmacy electronically. 

## 2013-06-11 ENCOUNTER — Ambulatory Visit (INDEPENDENT_AMBULATORY_CARE_PROVIDER_SITE_OTHER): Payer: Medicare Other | Admitting: Internal Medicine

## 2013-06-11 ENCOUNTER — Encounter: Payer: Self-pay | Admitting: Internal Medicine

## 2013-06-11 VITALS — BP 122/60 | HR 63 | Ht 65.0 in | Wt 174.5 lb

## 2013-06-11 DIAGNOSIS — E785 Hyperlipidemia, unspecified: Secondary | ICD-10-CM

## 2013-06-11 DIAGNOSIS — R079 Chest pain, unspecified: Secondary | ICD-10-CM

## 2013-06-11 DIAGNOSIS — I160 Hypertensive urgency: Secondary | ICD-10-CM

## 2013-06-11 DIAGNOSIS — I200 Unstable angina: Secondary | ICD-10-CM

## 2013-06-11 DIAGNOSIS — I10 Essential (primary) hypertension: Secondary | ICD-10-CM

## 2013-06-11 DIAGNOSIS — Z79899 Other long term (current) drug therapy: Secondary | ICD-10-CM

## 2013-06-11 DIAGNOSIS — K219 Gastro-esophageal reflux disease without esophagitis: Secondary | ICD-10-CM

## 2013-06-11 DIAGNOSIS — I2510 Atherosclerotic heart disease of native coronary artery without angina pectoris: Secondary | ICD-10-CM

## 2013-06-11 DIAGNOSIS — I214 Non-ST elevation (NSTEMI) myocardial infarction: Secondary | ICD-10-CM

## 2013-06-11 MED ORDER — CLOPIDOGREL BISULFATE 75 MG PO TABS: 75.0000 mg | ORAL_TABLET | Freq: Every day | ORAL | Status: DC

## 2013-06-11 NOTE — Progress Notes (Signed)
OFFICE NOTE  Chief Complaint:  Routine followup  Primary Care Physician: Delorse Lek, MD  HPI:  Kristin Shaffer is an 79 year old Caucasian female with a history of hyperlipidemia and hypothyroidism. She was seen by myself on March 20, 2013, and at that time was found to be in hypertensive urgency with a blood pressure of 230/94. She was admitted to [facility] for blood pressure control and cardiac catheterization for unstable angina. She did in fact have a non-ST-elevation myocardial infarction with a mildly elevated troponin of 0.67. She underwent coronary angiography, which revealed a subtotal occlusion of the mid right coronary artery. She then underwent a successful complex PCI of the entire mid RCA encompassing a long tubular 40% to 60% lesion that preceded 2 tandem 95% and 99% subtotal occlusions. This was completed using 3 overlapping Xience Xpedition drug-eluting stents. She was placed on aspirin and Brilinta, and was discharged. She subsequently returned to [facility] on April 26 to the emergency room with chest pain. Patient states that it was a "sharp, stabbing, and burning" pain. It was 10 out of 10 in intensity. She took 4 baby aspirin at home, but did not have any nitroglycerin. EMS gave her one, which provided her some relief, and midway to the ER, they gave her another one, which essentially resolved the pain. Patient reports feeling weak, but that is improving compared to when she was discharged. She has had no chest pain since the episode on the 26th. She was provided with a prescription at the ER for sublingual nitroglycerin. She did undergo an echocardiogram which showed preserved LV systolic function and an EF of 60-65%. There was stage I diastolic dysfunction and aortic sclerosis with mild central aortic regurgitation. Otherwise no significant abnormalities.  She also had carotid and renal Dopplers. The carotid Dopplers indicated a small amount of bilateral plaque. The  renal Dopplers did not indicate any renal artery stenosis. Her only complaint today he is in mild shortness of breath that she gets about 2 hours after taking her Brillinta, which she is attributing the symptoms to.  PMHx:  Past Medical History  Diagnosis Date  . Hypertension   . GERD (gastroesophageal reflux disease)   . Hypothyroidism   . Skin cancer     "both legs; right arm" (03/20/2013)  . High cholesterol   . Anginal pain   . Coronary artery disease   . Myocardial infarction     "dr's saw evidence I might have had a heart attack" (03/20/2013)  . Pneumonia 2012  . Exertional shortness of breath   . Arthritis     "different places; not bad" (03/20/2013)  . Anxiety     Past Surgical History  Procedure Laterality Date  . Coronary angioplasty with stent placement  03/20/2013    "3; total is 3" (03/20/2013)  . Appendectomy  2003  . Dilation and curettage of uterus  1956?  Marland Kitchen Cataract extraction w/ intraocular lens  implant, bilateral Bilateral ~ 2008  . Breast biopsy Bilateral     "total of 5; 2 on one side, 3 on the other; all benign" (03/20/2013)  . Skin cancer excision      "1 off right arm; 2 off each leg" (03/20/2013)    FAMHx:  No family history on file.  SOCHx:   reports that she has never smoked. She has never used smokeless tobacco. She reports that she does not drink alcohol or use illicit drugs.  ALLERGIES:  Allergies  Allergen Reactions  . Latex  Hives and Rash    ROS: A comprehensive review of systems was negative except for: Respiratory: positive for dyspnea on exertion  HOME MEDS: Current Outpatient Prescriptions  Medication Sig Dispense Refill  . acetic acid-hydrocortisone (VOSOL-HC) otic solution Place 3-4 drops into both ears 4 (four) times daily.      Marland Kitchen aspirin 81 MG chewable tablet Chew 81 mg by mouth daily.      Marland Kitchen atorvastatin (LIPITOR) 20 MG tablet Take 20 mg by mouth daily.      . calcium-vitamin D (OSCAL WITH D) 500-200 MG-UNIT per tablet Take 1  tablet by mouth daily.      . fenofibrate 54 MG tablet Take 1 tablet (54 mg total) by mouth daily.  30 tablet  5  . levothyroxine (SYNTHROID, LEVOTHROID) 88 MCG tablet Take 88 mcg by mouth daily before breakfast.      . lisinopril (PRINIVIL,ZESTRIL) 10 MG tablet Take 1 tablet (10 mg total) by mouth daily.  30 tablet  5  . Magnesium Oxide 250 MG TABS Take 1 tablet by mouth daily.      . metoprolol tartrate (LOPRESSOR) 12.5 mg TABS Take 0.5 tablets (12.5 mg total) by mouth 2 (two) times daily.  60 tablet  5  . nitroGLYCERIN (NITROSTAT) 0.4 MG SL tablet Place 1 tablet (0.4 mg total) under the tongue every 5 (five) minutes as needed for chest pain.  30 tablet  0  . omeprazole (PRILOSEC) 20 MG capsule Take 20 mg by mouth daily.      . potassium chloride SA (K-DUR,KLOR-CON) 20 MEQ tablet Take 1 tablet (20 mEq total) by mouth daily.  30 tablet  10  . clopidogrel (PLAVIX) 75 MG tablet Take 1 tablet (75 mg total) by mouth daily.  30 tablet  6   No current facility-administered medications for this visit.    LABS/IMAGING: No results found for this or any previous visit (from the past 48 hour(s)). No results found.  VITALS: BP 122/60  Pulse 63  Ht 5\' 5"  (1.651 m)  Wt 174 lb 8 oz (79.153 kg)  BMI 29.04 kg/m2  EXAM: General appearance: alert and no distress Neck: no adenopathy, no carotid bruit, no JVD, supple, symmetrical, trachea midline and thyroid not enlarged, symmetric, no tenderness/mass/nodules Lungs: clear to auscultation bilaterally Heart: regular rate and rhythm, S1, S2 normal and diastolic murmur: early diastolic 2/6, crescendo at 2nd left intercostal space Abdomen: soft, non-tender; bowel sounds normal; no masses,  no organomegaly Extremities: extremities normal, atraumatic, no cyanosis or edema Pulses: 2+ and symmetric Skin: Skin color, texture, turgor normal. No rashes or lesions Neurologic: Grossly normal  EKG: Normal sinus rhythm at 63  ASSESSMENT: 1. Coronary artery  disease status post recent and STEMI, PCI to the RCA with 4 overlapping Xience drug-eluting stents 2. Hypertension 3. Hypothyroidism 4. GERD 5. Dyspnea, possibly secondary to Brillinta  PLAN: 1.   Overall Ms. Dewitt is doing much better. Her hypertension is finally well controlled. She has no further significant chest pain. She is experiencing some mild shortness of breath, which could be related to her medications. We will go ahead and switch her over to Plavix and check a P2Y12 assay. She also needs a recheck of her lipid profile. I would continue her other current medications and we can see her back in 6 months to year. She will need to remain on dual antiplatelet therapy at least until April of 2015.  Chrystie Nose, MD, [facility] Attending Cardiologist [facility]  Donley Harland C 06/11/2013, 4:43 PM

## 2013-06-11 NOTE — Patient Instructions (Addendum)
Your physician has recommended you make the following change in your medication: STOP BRILLINTA ANS START PLAVIX 75 MG  Your physician has recommended you make the following change in your medication: P2Y12 AND NMR LIPIDS ON MONDAYS  Your physician recommends that you schedule a follow-up appointment in: 6 MONTHS

## 2013-06-16 ENCOUNTER — Other Ambulatory Visit: Payer: Self-pay | Admitting: Internal Medicine

## 2013-06-17 ENCOUNTER — Telehealth: Payer: Self-pay | Admitting: Internal Medicine

## 2013-06-17 DIAGNOSIS — E782 Mixed hyperlipidemia: Secondary | ICD-10-CM

## 2013-06-17 LAB — NMR LIPOPROFILE WITH LIPIDS

## 2013-06-17 NOTE — Telephone Encounter (Signed)
Lab order mailed to pt.

## 2013-06-17 NOTE — Telephone Encounter (Signed)
Call from [place] at [facility].  Stated they did not receive the tube for NMR lipoprofile.  Asked if they would be calling the pt to inform her she needs to return to repeat lab draw.  Informed the hospital does not call patients back and only calls the client.  Call to pt and informed.  Apologized that she will need to repeat test.  Pt informed she can go to a [facility] near her or in our building for this test.  Pt stated Dr. Rennis Golden told her she had to go to [facility] for this and pt informed that was for the Platelet test.  Pt verbalized understanding and stated she cannot return for lab draw until Monday.  Lab reordered.  Message forwarded to Dr. Rennis Golden [facility]).

## 2013-06-19 ENCOUNTER — Telehealth: Payer: Self-pay | Admitting: *Deleted

## 2013-06-19 ENCOUNTER — Other Ambulatory Visit: Payer: Self-pay | Admitting: *Deleted

## 2013-06-19 MED ORDER — PRASUGREL HCL 10 MG PO TABS: 10.0000 mg | ORAL_TABLET | Freq: Every day | ORAL | Status: DC

## 2013-06-19 NOTE — Telephone Encounter (Signed)
Message copied by Mallie Mussel on Thu Jun 19, 2013 11:00 AM ------      Message from: Chrystie Nose      Created: Thu Jun 19, 2013  8:36 AM       The platelet test shows that Plavix may not be working as well as we like. I would like to switch her to Effient asap (instead of plavix) - she can get samples and an Rx from [place].            Dr. Rennis Golden ------

## 2013-06-19 NOTE — Telephone Encounter (Signed)
Rx was sent to pharmacy electronically. 

## 2013-06-19 NOTE — Telephone Encounter (Signed)
Called patient and informed about stopping plavix and starting effient 10mg  daily. Verbalized understanding. Medication ordered and sent to pharmacy.

## 2013-06-24 LAB — NMR LIPOPROFILE WITH LIPIDS
HDL Size: 8.5 nm — ABNORMAL LOW (ref >=9.2)
LDL (calc): 143 mg/dL — ABNORMAL HIGH (ref <100)
LDL Particle Number: 1783 nmol/L — ABNORMAL HIGH (ref <1000)
LP-IR Score: 77 — ABNORMAL HIGH (ref <=45)
Large VLDL-P: 5.6 nmol/L — ABNORMAL HIGH (ref <=2.7)
Small LDL Particle Number: 621 nmol/L — ABNORMAL HIGH (ref <=527)
Triglycerides: 163 mg/dL — ABNORMAL HIGH (ref <150)
VLDL Size: 54.2 nm — ABNORMAL HIGH (ref <=46.6)

## 2013-06-25 ENCOUNTER — Telehealth: Payer: Self-pay | Admitting: Internal Medicine

## 2013-06-25 MED ORDER — ATORVASTATIN CALCIUM 40 MG PO TABS: 40.0000 mg | ORAL_TABLET | Freq: Every day | ORAL | Status: DC

## 2013-06-25 NOTE — Telephone Encounter (Signed)
Returned call.  Pt informed message received and Eileen Stanford, RN will be notified of PA needed for Effient.  Pt also stated she received a call yesterday from our office and didn't know what it was about.  Reviewed chart and lab results back for lipids.  Pt informed of results per Dr. Blanchie Dessert note and that Rx will be sent to pharmacy.  Pt stated she just picked up new Rx and advised to take two 20 mg tabs until she runs out and an Rx will be sent for new dose.  Pt verbalized understanding and agreed w/ plan.  Med list updated w/ new Rx and Plavix dc'd.  Message forwarded to J. Jeannetta Nap, RN to complete PA for Effient.

## 2013-06-25 NOTE — Telephone Encounter (Signed)
Please call -her ins have rejected new medicine-still have not gotten it

## 2013-06-26 ENCOUNTER — Encounter: Payer: Self-pay | Admitting: Internal Medicine

## 2013-06-30 ENCOUNTER — Telehealth: Payer: Self-pay | Admitting: *Deleted

## 2013-06-30 NOTE — Telephone Encounter (Signed)
Prior Authorization for Effient 10mg  #[id number] was approved on 06/25/13 and is good until 07/26/13.

## 2013-10-09 ENCOUNTER — Other Ambulatory Visit: Payer: Self-pay | Admitting: Internal Medicine

## 2013-10-09 NOTE — Telephone Encounter (Signed)
Rx was sent to pharmacy electronically. 

## 2013-11-30 ENCOUNTER — Encounter: Payer: Self-pay | Admitting: *Deleted

## 2013-12-03 ENCOUNTER — Ambulatory Visit (INDEPENDENT_AMBULATORY_CARE_PROVIDER_SITE_OTHER): Payer: Medicare Other | Admitting: Internal Medicine

## 2013-12-03 ENCOUNTER — Encounter: Payer: Self-pay | Admitting: Internal Medicine

## 2013-12-03 VITALS — BP 136/88 | HR 58 | Ht 65.5 in | Wt 187.5 lb

## 2013-12-03 DIAGNOSIS — I2510 Atherosclerotic heart disease of native coronary artery without angina pectoris: Secondary | ICD-10-CM

## 2013-12-03 DIAGNOSIS — E782 Mixed hyperlipidemia: Secondary | ICD-10-CM | POA: Insufficient documentation

## 2013-12-03 DIAGNOSIS — E785 Hyperlipidemia, unspecified: Secondary | ICD-10-CM

## 2013-12-03 DIAGNOSIS — Z9861 Coronary angioplasty status: Secondary | ICD-10-CM

## 2013-12-03 DIAGNOSIS — E039 Hypothyroidism, unspecified: Secondary | ICD-10-CM

## 2013-12-03 DIAGNOSIS — Z955 Presence of coronary angioplasty implant and graft: Secondary | ICD-10-CM

## 2013-12-03 DIAGNOSIS — I214 Non-ST elevation (NSTEMI) myocardial infarction: Secondary | ICD-10-CM

## 2013-12-03 MED ORDER — PRASUGREL HCL 10 MG PO TABS: 10.0000 mg | ORAL_TABLET | Freq: Every day | ORAL | Status: DC

## 2013-12-03 NOTE — Progress Notes (Signed)
OFFICE NOTE  Chief Complaint:  Routine followup  Primary Care Physician: Stephens Shire, MD  HPI:  Kristin Shaffer is an 80 year old Caucasian female with a history of hyperlipidemia and hypothyroidism. She was seen by myself on March 20, 2013, and at that time was found to be in hypertensive urgency with a blood pressure of 230/94. She was admitted to [facility] for blood pressure control and cardiac catheterization for unstable angina. She did in fact have a non-ST-elevation myocardial infarction with a mildly elevated troponin of 0.67. She underwent coronary angiography, which revealed a subtotal occlusion of the mid right coronary artery. She then underwent a successful complex PCI of the entire mid RCA encompassing a long tubular 40% to 60% lesion that preceded 2 tandem 95% and 99% subtotal occlusions. This was completed using 3 overlapping Xience Xpedition drug-eluting stents. She was placed on aspirin and Brilinta, and was discharged. She subsequently returned to [facility] on April 26 to the emergency room with chest pain. Patient states that it was a "sharp, stabbing, and burning" pain. It was 10 out of 10 in intensity. She took 4 baby aspirin at home, but did not have any nitroglycerin. EMS gave her one, which provided her some relief, and midway to the ER, they gave her another one, which essentially resolved the pain. Patient reports feeling weak, but that is improving compared to when she was discharged. She has had no chest pain since the episode on the 26th. She was provided with a prescription at the ER for sublingual nitroglycerin. She did undergo an echocardiogram which showed preserved LV systolic function and an EF of 60-65%. There was stage I diastolic dysfunction and aortic sclerosis with mild central aortic regurgitation. Otherwise no significant abnormalities.  She also had carotid and renal Dopplers. The carotid Dopplers indicated a small amount of bilateral plaque.  The renal Dopplers did not indicate any renal artery stenosis.  Recently she had lipid profile performed which showed a particle number of 1783, with LDL C. of 143.  Based on this I recommended increasing her Lipitor from 20-40 mg.  She did make this change, but as noted pain and weakness in her joints, especially when she wakes up in the morning which takes about 30 minutes to improve. This sounds a lot like arthritis, but she really feels it is related to her cholesterol medicine.  PMHx:  Past Medical History  Diagnosis Date  . Hypertension   . GERD (gastroesophageal reflux disease)   . Hypothyroidism   . Skin cancer     "both legs; right arm" (03/20/2013)  . High cholesterol   . Anginal pain   . Coronary artery disease   . Myocardial infarction     "dr's saw evidence I might have had a heart attack" (03/20/2013)  . Pneumonia 2012  . Exertional shortness of breath   . Arthritis     "different places; not bad" (03/20/2013)  . Anxiety   . CAD (coronary artery disease)   . NSTEMI (non-ST elevated myocardial infarction) 03/20/2013    cath - mid RCA    Past Surgical History  Procedure Laterality Date  . Coronary angioplasty with stent placement  03/20/2013    NSTEMI - subtotal occlusion of mid RCA - PCI of mid RCA of long tubular 40-60% lesion - 2 tandem 95-99% subtotal occlusions - 3 overlapping Xience Xpedition DES (Dr. Roni Bread)   . Appendectomy  2003  . Dilation and curettage of uterus  1956?  Marland Kitchen  Cataract extraction w/ intraocular lens  implant, bilateral Bilateral ~ 2008  . Breast biopsy Bilateral 1990s    "total of 5; 2 on one side, 3 on the other; all benign" (03/20/2013)  . Skin cancer excision      "1 off right arm; 2 off each leg" (03/20/2013)  . Transthoracic echocardiogram  03/2013    EF 123456, grade 1 diastolic dysfunction; mild MR; calcifed MV annulus; LA mildly dilated    FAMHx:  Family History  Problem Relation Age of Onset  . Dementia Mother   . Lung cancer  Father   . Cancer Brother     SOCHx:   reports that she has never smoked. She has never used smokeless tobacco. She reports that she does not drink alcohol or use illicit drugs.  ALLERGIES:  Allergies  Allergen Reactions  . Latex Hives and Rash    ROS: A comprehensive review of systems was negative except for: Musculoskeletal: positive for muscle weakness  HOME MEDS: Current Outpatient Prescriptions  Medication Sig Dispense Refill  . acetic acid-hydrocortisone (VOSOL-HC) otic solution Place 3-4 drops into both ears as needed.       Marland Kitchen aspirin 81 MG chewable tablet Chew 81 mg by mouth daily.      Marland Kitchen atorvastatin (LIPITOR) 40 MG tablet Take 20 mg by mouth daily.      . calcium-vitamin D (OSCAL WITH D) 500-200 MG-UNIT per tablet Take 1 tablet by mouth daily.      . fenofibrate 54 MG tablet Take 1 tablet (54 mg total) by mouth daily.  30 tablet  5  . levothyroxine (SYNTHROID, LEVOTHROID) 88 MCG tablet Take 88 mcg by mouth daily before breakfast.      . lisinopril (PRINIVIL,ZESTRIL) 10 MG tablet TAKE 1 TABLET BY MOUTH EVERY DAY  30 tablet  5  . Magnesium Oxide 250 MG TABS Take 1 tablet by mouth daily.      . metoprolol tartrate (LOPRESSOR) 12.5 mg TABS Take 0.5 tablets (12.5 mg total) by mouth 2 (two) times daily.  60 tablet  5  . nitroGLYCERIN (NITROSTAT) 0.4 MG SL tablet Place 1 tablet (0.4 mg total) under the tongue every 5 (five) minutes as needed for chest pain.  30 tablet  0  . omeprazole (PRILOSEC) 20 MG capsule Take 20 mg by mouth daily.      . potassium chloride SA (K-DUR,KLOR-CON) 20 MEQ tablet Take 1 tablet (20 mEq total) by mouth daily.  30 tablet  10  . prasugrel (EFFIENT) 10 MG TABS tablet Take 1 tablet (10 mg total) by mouth daily.  28 tablet  0   No current facility-administered medications for this visit.    LABS/IMAGING: No results found for this or any previous visit (from the past 48 hour(s)). No results found.  VITALS: BP 136/88  Pulse 58  Ht 5' 5.5" (1.664 m)   Wt 187 lb 8 oz (85.049 kg)  BMI 30.72 kg/m2  EXAM: General appearance: alert and no distress Neck: no adenopathy, no carotid bruit, no JVD, supple, symmetrical, trachea midline and thyroid not enlarged, symmetric, no tenderness/mass/nodules Lungs: clear to auscultation bilaterally Heart: regular rate and rhythm, S1, S2 normal and diastolic murmur: early diastolic 2/6, crescendo at 2nd left intercostal space Abdomen: soft, non-tender; bowel sounds normal; no masses,  no organomegaly Extremities: extremities normal, atraumatic, no cyanosis or edema Pulses: 2+ and symmetric Skin: Skin color, texture, turgor normal. No rashes or lesions Neurologic: Grossly normal  EKG: Sinus bradycardia at 58  ASSESSMENT: 1.  Coronary artery disease status post recent and STEMI, PCI to the RCA with 4 overlapping Xience drug-eluting stents 2. Hypertension 3. Hypothyroidism 4. GERD 5. Dyspnea, possibly secondary to Brillinta 6. Dyslipidemia - statin intolerance  PLAN: 1.   Overall Ms. Moldovan is doing much better. Her hypertension is finally well controlled. She has no further significant chest pain. A P2Y12 assay was high at 232, suggesting that Plavix may not be effective for her.  I recommended switching her to Effient as an alternative.  Her only complaint about this is the cause, but hopefully will only need to continue it for the next 4 months. I have recommended that she decrease her Lipitor to 20 mg daily. We'll go ahead and recheck a lipid profile before that to see if the higher dose Lipitor was working for her. If this is the case, I would recommend adding Zetia to her lower dose Lipitor to reach her goal.  Pixie Casino, MD, [facility] Attending Cardiologist [facility] [place] [facility] 12/03/2013, 6:22 PM

## 2013-12-03 NOTE — Patient Instructions (Signed)
Decrease atorvastatin (Lipitor) to 20mg  daily.  Please have fasting blood work to check your cholesterol and thyroid levels at your earliest convenience. We will call you with the results.   Your physician recommends that you schedule a follow-up appointment in: May 2015

## 2013-12-04 ENCOUNTER — Encounter: Payer: Self-pay | Admitting: Internal Medicine

## 2013-12-07 ENCOUNTER — Inpatient Hospital Stay (HOSPITAL_COMMUNITY)
Admission: EM | Admit: 2013-12-07 | Discharge: 2013-12-09 | DRG: 251 | Disposition: A | Discharge status: Home / Self Care | Payer: Medicare Other | Attending: Cardiology | Admitting: Cardiology

## 2013-12-07 ENCOUNTER — Emergency Department (HOSPITAL_COMMUNITY): Payer: Medicare Other

## 2013-12-07 ENCOUNTER — Encounter (HOSPITAL_COMMUNITY): Payer: Self-pay | Admitting: Emergency Medicine

## 2013-12-07 DIAGNOSIS — Z79899 Other long term (current) drug therapy: Secondary | ICD-10-CM

## 2013-12-07 DIAGNOSIS — I214 Non-ST elevation (NSTEMI) myocardial infarction: Secondary | ICD-10-CM

## 2013-12-07 DIAGNOSIS — E039 Hypothyroidism, unspecified: Secondary | ICD-10-CM | POA: Diagnosis present

## 2013-12-07 DIAGNOSIS — Y849 Medical procedure, unspecified as the cause of abnormal reaction of the patient, or of later complication, without mention of misadventure at the time of the procedure: Secondary | ICD-10-CM | POA: Diagnosis present

## 2013-12-07 DIAGNOSIS — I10 Essential (primary) hypertension: Secondary | ICD-10-CM | POA: Diagnosis present

## 2013-12-07 DIAGNOSIS — I252 Old myocardial infarction: Secondary | ICD-10-CM

## 2013-12-07 DIAGNOSIS — E785 Hyperlipidemia, unspecified: Secondary | ICD-10-CM | POA: Diagnosis present

## 2013-12-07 DIAGNOSIS — E782 Mixed hyperlipidemia: Secondary | ICD-10-CM | POA: Diagnosis present

## 2013-12-07 DIAGNOSIS — K219 Gastro-esophageal reflux disease without esophagitis: Secondary | ICD-10-CM | POA: Diagnosis present

## 2013-12-07 DIAGNOSIS — Z801 Family history of malignant neoplasm of trachea, bronchus and lung: Secondary | ICD-10-CM

## 2013-12-07 DIAGNOSIS — Z85828 Personal history of other malignant neoplasm of skin: Secondary | ICD-10-CM

## 2013-12-07 DIAGNOSIS — T82897A Other specified complication of cardiac prosthetic devices, implants and grafts, initial encounter: Principal | ICD-10-CM | POA: Diagnosis present

## 2013-12-07 DIAGNOSIS — R079 Chest pain, unspecified: Secondary | ICD-10-CM

## 2013-12-07 DIAGNOSIS — I200 Unstable angina: Secondary | ICD-10-CM | POA: Diagnosis present

## 2013-12-07 DIAGNOSIS — Z91040 Latex allergy status: Secondary | ICD-10-CM

## 2013-12-07 DIAGNOSIS — I2510 Atherosclerotic heart disease of native coronary artery without angina pectoris: Secondary | ICD-10-CM | POA: Diagnosis present

## 2013-12-07 DIAGNOSIS — F411 Generalized anxiety disorder: Secondary | ICD-10-CM | POA: Diagnosis present

## 2013-12-07 DIAGNOSIS — M129 Arthropathy, unspecified: Secondary | ICD-10-CM | POA: Diagnosis present

## 2013-12-07 DIAGNOSIS — Z7982 Long term (current) use of aspirin: Secondary | ICD-10-CM

## 2013-12-07 LAB — CBC WITH DIFFERENTIAL/PLATELET
BASOS ABS: 0 10*3/uL (ref 0.0–0.1)
BASOS PCT: 0 % (ref 0–1)
Eosinophils Absolute: 0.1 10*3/uL (ref 0.0–0.7)
Eosinophils Relative: 2 % (ref 0–5)
HCT: 35.9 % — ABNORMAL LOW (ref 36.0–46.0)
Hemoglobin: 12.3 g/dL (ref 12.0–15.0)
LYMPHS PCT: 22 % (ref 12–46)
Lymphs Abs: 1.4 10*3/uL (ref 0.7–4.0)
MCH: 30.8 pg (ref 26.0–34.0)
MCHC: 34.3 g/dL (ref 30.0–36.0)
MCV: 89.8 fL (ref 78.0–100.0)
Monocytes Absolute: 0.6 10*3/uL (ref 0.1–1.0)
Monocytes Relative: 10 % (ref 3–12)
NEUTROS ABS: 3.9 10*3/uL (ref 1.7–7.7)
NEUTROS PCT: 65 % (ref 43–77)
PLATELETS: 202 10*3/uL (ref 150–400)
RBC: 4 MIL/uL (ref 3.87–5.11)
RDW: 13.9 % (ref 11.5–15.5)
WBC: 6 10*3/uL (ref 4.0–10.5)

## 2013-12-07 LAB — BASIC METABOLIC PANEL
BUN: 19 mg/dL (ref 6–23)
CHLORIDE: 103 meq/L (ref 96–112)
CO2: 24 meq/L (ref 19–32)
Calcium: 9.7 mg/dL (ref 8.4–10.5)
Creatinine, Ser: 0.89 mg/dL (ref 0.50–1.10)
GFR calc non Af Amer: 60 mL/min — ABNORMAL LOW (ref >90)
GFR, EST AFRICAN AMERICAN: 69 mL/min — AB (ref >90)
Glucose, Bld: 115 mg/dL — ABNORMAL HIGH (ref 70–99)
Potassium: 4.4 mEq/L (ref 3.7–5.3)
SODIUM: 140 meq/L (ref 137–147)

## 2013-12-07 LAB — TROPONIN I
Troponin I: <0.3 ng/mL (ref <0.30)
Troponin I: <0.3 ng/mL (ref <0.30)

## 2013-12-07 LAB — HEPARIN LEVEL (UNFRACTIONATED): Heparin Unfractionated: 0.5 IU/mL (ref 0.30–0.70)

## 2013-12-07 LAB — TSH: TSH: 12.545 u[IU]/mL — ABNORMAL HIGH (ref 0.350–4.500)

## 2013-12-07 LAB — PRO B NATRIURETIC PEPTIDE: Pro B Natriuretic peptide (BNP): 249.3 pg/mL (ref 0–450)

## 2013-12-07 MED ORDER — SODIUM CHLORIDE 0.9 % IJ SOLN
3.0000 mL | Freq: Two times a day (BID) | INTRAMUSCULAR | Status: DC
  Administered 2013-12-07 (×2): 3 mL via INTRAVENOUS

## 2013-12-07 MED ORDER — ACETAMINOPHEN 325 MG PO TABS: 650.0000 mg | ORAL_TABLET | ORAL | Status: DC | PRN

## 2013-12-07 MED ORDER — PRASUGREL HCL 10 MG PO TABS
10.0000 mg | ORAL_TABLET | Freq: Every day | ORAL | Status: DC
  Administered 2013-12-07 – 2013-12-09 (×3): 10 mg via ORAL
  Filled 2013-12-07 (×4): qty 1

## 2013-12-07 MED ORDER — SODIUM CHLORIDE 0.9 % IJ SOLN: 3.0000 mL | INTRAMUSCULAR | Status: DC | PRN

## 2013-12-07 MED ORDER — ASPIRIN 81 MG PO CHEW: 81.0000 mg | CHEWABLE_TABLET | Freq: Every day | ORAL | Status: DC

## 2013-12-07 MED ORDER — ONDANSETRON HCL 4 MG/2ML IJ SOLN: 4.0000 mg | Freq: Four times a day (QID) | INTRAMUSCULAR | Status: DC | PRN

## 2013-12-07 MED ORDER — LEVOTHYROXINE SODIUM 88 MCG PO TABS
88.0000 ug | ORAL_TABLET | Freq: Every day | ORAL | Status: DC
  Administered 2013-12-07 – 2013-12-09 (×3): 88 ug via ORAL
  Filled 2013-12-07 (×4): qty 1

## 2013-12-07 MED ORDER — FENOFIBRATE 54 MG PO TABS
54.0000 mg | ORAL_TABLET | Freq: Every day | ORAL | Status: DC
  Administered 2013-12-07 – 2013-12-09 (×3): 54 mg via ORAL
  Filled 2013-12-07 (×4): qty 1

## 2013-12-07 MED ORDER — MAGNESIUM OXIDE 400 (241.3 MG) MG PO TABS
200.0000 mg | ORAL_TABLET | Freq: Every day | ORAL | Status: DC
  Administered 2013-12-07 – 2013-12-09 (×2): 200 mg via ORAL
  Filled 2013-12-07 (×3): qty 0.5

## 2013-12-07 MED ORDER — METOPROLOL TARTRATE 12.5 MG HALF TABLET
12.5000 mg | ORAL_TABLET | Freq: Two times a day (BID) | ORAL | Status: DC
  Administered 2013-12-07 – 2013-12-09 (×4): 12.5 mg via ORAL
  Filled 2013-12-07 (×9): qty 1

## 2013-12-07 MED ORDER — POTASSIUM CHLORIDE CRYS ER 20 MEQ PO TBCR
20.0000 meq | EXTENDED_RELEASE_TABLET | Freq: Every day | ORAL | Status: DC
  Administered 2013-12-07 – 2013-12-09 (×3): 20 meq via ORAL
  Filled 2013-12-07 (×4): qty 1

## 2013-12-07 MED ORDER — ZOLPIDEM TARTRATE 5 MG PO TABS: 5.0000 mg | ORAL_TABLET | Freq: Once | ORAL | Status: DC

## 2013-12-07 MED ORDER — MAGNESIUM OXIDE 250 MG PO TABS: 1.0000 | ORAL_TABLET | Freq: Every day | ORAL | Status: DC

## 2013-12-07 MED ORDER — ASPIRIN EC 81 MG PO TBEC
81.0000 mg | DELAYED_RELEASE_TABLET | Freq: Every day | ORAL | Status: DC
  Administered 2013-12-08 – 2013-12-09 (×2): 81 mg via ORAL
  Filled 2013-12-07 (×3): qty 1

## 2013-12-07 MED ORDER — HEPARIN BOLUS VIA INFUSION
4000.0000 [IU] | Freq: Once | INTRAVENOUS | Status: AC
  Administered 2013-12-07: 4000 [IU] via INTRAVENOUS
  Filled 2013-12-07: qty 4000

## 2013-12-07 MED ORDER — LISINOPRIL 10 MG PO TABS
10.0000 mg | ORAL_TABLET | Freq: Every day | ORAL | Status: DC
  Administered 2013-12-07 – 2013-12-09 (×3): 10 mg via ORAL
  Filled 2013-12-07 (×4): qty 1

## 2013-12-07 MED ORDER — PANTOPRAZOLE SODIUM 40 MG PO TBEC
40.0000 mg | DELAYED_RELEASE_TABLET | Freq: Every day | ORAL | Status: DC
  Administered 2013-12-07 – 2013-12-09 (×3): 40 mg via ORAL
  Filled 2013-12-07 (×3): qty 1

## 2013-12-07 MED ORDER — CALCIUM GLUCONATE 500 MG PO TABS
1.0000 | ORAL_TABLET | Freq: Every day | ORAL | Status: DC
  Administered 2013-12-07: 500 mg via ORAL
  Filled 2013-12-07 (×2): qty 1

## 2013-12-07 MED ORDER — HEPARIN (PORCINE) IN NACL 100-0.45 UNIT/ML-% IJ SOLN
900.0000 [IU]/h | INTRAMUSCULAR | Status: DC
  Administered 2013-12-07: 900 [IU]/h via INTRAVENOUS
  Filled 2013-12-07 (×2): qty 250

## 2013-12-07 MED ORDER — ATORVASTATIN CALCIUM 20 MG PO TABS
20.0000 mg | ORAL_TABLET | Freq: Every day | ORAL | Status: DC
  Administered 2013-12-07 – 2013-12-08 (×2): 20 mg via ORAL
  Filled 2013-12-07 (×4): qty 1

## 2013-12-07 MED ORDER — SODIUM CHLORIDE 0.9 % IV SOLN
250.0000 mL | INTRAVENOUS | Status: DC | PRN
  Administered 2013-12-08: 250 mL via INTRAVENOUS

## 2013-12-07 MED ORDER — NITROGLYCERIN 0.4 MG SL SUBL
0.4000 mg | SUBLINGUAL_TABLET | SUBLINGUAL | Status: DC | PRN
  Administered 2013-12-07 – 2013-12-08 (×3): 0.4 mg via SUBLINGUAL
  Filled 2013-12-07 (×2): qty 25

## 2013-12-07 NOTE — H&P (Signed)
CARDIOLOGY ADMISSION NOTE  Patient ID: Kristin Shaffer MRN: [id number] DOB/AGE: 1931-01-17 80 y.o.  Admit date: 12/07/2013 Primary Physician   Stephens Shire, MD Primary Cardiologist   Dr. Debara Pickett  Chief Complaint    Chest pain  HPI:  The patient presents for evaluation of chest pain.  She has a history of PCI as below in April of last year.  She had been doing well.  She was noted to be inadequately reactive to Plavix and was treated with Effient at the last visit with Dr. Debara Pickett.  Tonight after getting ready for bed she had left arm pain and chest pain similar to her previous angina.  She took NTG x 2 at home without relief.  She called EMS.  They did give her 3 more NTG and her pain resolved.  Since being in the ER she has had no further pain.  Her pain was throbbing.  It was 6/10.  She was slightly nauseated and could not get comfortable.  She had otherwise been doing fine.  The patient denies any PND or orthopnea. There have been no reported palpitations, presyncope or syncope.  She was walking daily until it got cold.     Past Medical History  Diagnosis Date  . Hypertension   . GERD (gastroesophageal reflux disease)   . Hypothyroidism   . Skin cancer     "both legs; right arm" (03/20/2013)  . High cholesterol   . Anginal pain   . Coronary artery disease   . Myocardial infarction     "dr's saw evidence I might have had a heart attack" (03/20/2013)  . Pneumonia 2012  . Exertional shortness of breath   . Arthritis     "different places; not bad" (03/20/2013)  . Anxiety   . CAD (coronary artery disease)   . NSTEMI (non-ST elevated myocardial infarction) 03/20/2013    cath - mid RCA    Past Surgical History  Procedure Laterality Date  . Coronary angioplasty with stent placement  03/20/2013    NSTEMI - subtotal occlusion of mid RCA - PCI of mid RCA of long tubular 40-60% lesion - 2 tandem 95-99% subtotal occlusions - 3 overlapping Xience Xpedition DES (Dr. Roni Bread)   .  Appendectomy  2003  . Dilation and curettage of uterus  1956?  Marland Kitchen Cataract extraction w/ intraocular lens  implant, bilateral Bilateral ~ 2008  . Breast biopsy Bilateral 1990s    "total of 5; 2 on one side, 3 on the other; all benign" (03/20/2013)  . Skin cancer excision      "1 off right arm; 2 off each leg" (03/20/2013)  . Transthoracic echocardiogram  03/2013    EF 123456, grade 1 diastolic dysfunction; mild MR; calcifed MV annulus; LA mildly dilated    Allergies  Allergen Reactions  . Latex Hives and Rash   No current facility-administered medications on file prior to encounter.   Current Outpatient Prescriptions on File Prior to Encounter  Medication Sig Dispense Refill  . acetic acid-hydrocortisone (VOSOL-HC) otic solution Place 3-4 drops into both ears as needed.       Marland Kitchen aspirin 81 MG chewable tablet Chew 81 mg by mouth daily.      Marland Kitchen atorvastatin (LIPITOR) 40 MG tablet Take 20 mg by mouth daily.      . calcium-vitamin D (OSCAL WITH D) 500-200 MG-UNIT per tablet Take 1 tablet by mouth daily.      . fenofibrate 54 MG tablet Take 1 tablet (54 mg total)  by mouth daily.  30 tablet  5  . levothyroxine (SYNTHROID, LEVOTHROID) 88 MCG tablet Take 88 mcg by mouth daily before breakfast.      . lisinopril (PRINIVIL,ZESTRIL) 10 MG tablet TAKE 1 TABLET BY MOUTH EVERY DAY  30 tablet  5  . Magnesium Oxide 250 MG TABS Take 1 tablet by mouth daily.      . metoprolol tartrate (LOPRESSOR) 12.5 mg TABS Take 0.5 tablets (12.5 mg total) by mouth 2 (two) times daily.  60 tablet  5  . nitroGLYCERIN (NITROSTAT) 0.4 MG SL tablet Place 1 tablet (0.4 mg total) under the tongue every 5 (five) minutes as needed for chest pain.  30 tablet  0  . omeprazole (PRILOSEC) 20 MG capsule Take 20 mg by mouth daily.      . potassium chloride SA (K-DUR,KLOR-CON) 20 MEQ tablet Take 1 tablet (20 mEq total) by mouth daily.  30 tablet  10  . prasugrel (EFFIENT) 10 MG TABS tablet Take 1 tablet (10 mg total) by mouth daily.  28  tablet  0   History   Social History  . Marital Status: Widowed    Spouse Name: N/A    Number of Children: N/A  . Years of Education: N/A   Occupational History  . Not on file.   Social History Main Topics  . Smoking status: Never Smoker   . Smokeless tobacco: Never Used  . Alcohol Use: No  . Drug Use: No  . Sexual Activity: No   Other Topics Concern  . Not on file   Social History Narrative  . No narrative on file    Family History  Problem Relation Age of Onset  . Dementia Mother   . Lung cancer Father   . Cancer Brother      ROS:  As stated in the HPI and negative for all other systems.  Physical Exam: Blood pressure 112/50, pulse 58, temperature 97.3 F (36.3 C), temperature source Oral, resp. rate 18, SpO2 94.00%.  GENERAL:  Well appearing HEENT:  Pupils equal round and reactive, fundi not visualized, oral mucosa unremarkable NECK:  No jugular venous distention, waveform within normal limits, carotid upstroke brisk and symmetric, no bruits, no thyromegaly LYMPHATICS:  No cervical, inguinal adenopathy LUNGS:  Clear to auscultation bilaterally BACK:  No CVA tenderness CHEST:  Unremarkable HEART:  PMI not displaced or sustained,S1 and S2 within normal limits, no S3, no S4, no clicks, no rubs, no murmurs ABD:  Flat, positive bowel sounds normal in frequency in pitch, no bruits, no rebound, no guarding, no midline pulsatile mass, no hepatomegaly, no splenomegaly EXT:  2 plus pulses throughout, no edema, no cyanosis no clubbing SKIN:  No rashes no nodules NEURO:  Cranial nerves II through XII grossly intact, motor grossly intact throughout PSYCH:  Cognitively intact, oriented to person place and time    Labs: Lab Results  Component Value Date   BUN 19 12/07/2013   Lab Results  Component Value Date   CREATININE 0.89 12/07/2013   Lab Results  Component Value Date   NA 140 12/07/2013   K 4.4 12/07/2013   CL 103 12/07/2013   CO2 24 12/07/2013   Lab Results    Component Value Date   TROPONINI <0.30 12/07/2013   Lab Results  Component Value Date   WBC 6.0 12/07/2013   HGB 12.3 12/07/2013   HCT 35.9* 12/07/2013   MCV 89.8 12/07/2013   PLT 202 12/07/2013   Lab Results  Component Value Date  CHOL 206* 03/21/2013   HDL 52 03/21/2013   LDLCALC 143* 06/23/2013   TRIG 163* 06/23/2013   CHOLHDL 4.0 03/21/2013   Lab Results  Component Value Date   ALT 17 03/22/2013   AST 46* 03/22/2013   ALKPHOS 103 03/22/2013   BILITOT 0.6 03/22/2013      Radiology:  CXR:  Stable mild chronic interstitial changes with left lung base  subsegmental atelectasis versus scarring.   EKG:  Sinus rhythm, rate 61, axis within normal limits, intervals within normal limits, no acute ST-T wave changes.   ASSESSMENT AND PLAN:    CHEST PAIN:  Her chest pain is similar to previous unstable angina.  I will admit, cycle enzymes and start heparin.  The pretest probability of obstructive CAD is high.  Cath is indicated.  The patient understands that risks included but are not limited to stroke (1 in 1000), death (1 in 20), kidney failure [usually temporary] (1 in 500), bleeding (1 in 200), allergic reaction [possibly serious] (1 in 200).  The patient understands and agrees to proceed.   Of note she wants to be adequately sedated for the cath.  Of note she was a nonresponder to Plavix.    HTN:    She will continue her current meds.   HYPOTHYROIDISM:  I will check a TSH  HYPERLIPIDEMIA:   She recently had her Lipitor decreased to 20 mg.  I will continue with this dose.   SignedMinus Breeding 12/07/2013, 5:01 AM

## 2013-12-07 NOTE — Progress Notes (Signed)
ANTICOAGULATION CONSULT NOTE - Follow Up Consult  Pharmacy Consult for heparin Indication: chest pain/ACS  Allergies  Allergen Reactions  . Zocor [Simvastatin] Other (See Comments)    Leg weakness and cramping  . Latex Hives and Rash    Patient Measurements: Height: 5' 5.35" (166 cm) Weight: 187 lb 6.3 oz (85 kg) IBW/kg (Calculated) : 57.81 Heparin Dosing Weight: 75.4kg  Vital Signs: Temp: 97 F (36.1 C) (01/11 0841) Temp src: Axillary (01/11 0841) BP: 136/37 mmHg (01/11 1115) Pulse Rate: 73 (01/11 1115)  Labs:  Recent Labs  12/07/13 0340 12/07/13 1230 12/07/13 1825  HGB 12.3  --   --   HCT 35.9*  --   --   PLT 202  --   --   HEPARINUNFRC  --   --  0.50  CREATININE 0.89  --   --   TROPONINI <0.30 <0.30 <0.30    Estimated Creatinine Clearance: 54.7 ml/min (by C-G formula based on Cr of 0.89).   Medications:  Scheduled:  . [START ON 12/08/2013] aspirin EC  81 mg Oral Daily  . atorvastatin  20 mg Oral q1800  . calcium gluconate  1 tablet Oral Daily  . fenofibrate  54 mg Oral Daily  . levothyroxine  88 mcg Oral QAC breakfast  . lisinopril  10 mg Oral Daily  . magnesium oxide  200 mg Oral Daily  . metoprolol tartrate  12.5 mg Oral BID  . pantoprazole  40 mg Oral Daily  . potassium chloride SA  20 mEq Oral Daily  . prasugrel  10 mg Oral Daily  . sodium chloride  3 mL Intravenous Q12H   Infusions:  . heparin 900 Units/hr (12/07/13 1241)    Assessment: 82 YOF presents for evaluation of chest pain and left arm pain. Patient on heparin for ACS and initial heparin level is at goal (HL= 0.5)  Goal of Therapy:  Heparin level 0.3-0.7 units/ml Monitor platelets by anticoagulation protocol: Yes   Plan:   -No heparin changes needed -Daily heparin level and CBC  Hildred Laser, Pharm D 12/07/2013 8:03 PM

## 2013-12-07 NOTE — Progress Notes (Signed)
Pt reported chest pain tightness in chest with [place] shooting pain down left arm. NTG tablet given per protocol with relief noted after only one tablet. Pt denied nausea, sweating, etc. No blurred vision or other sx with CP. Other medications given as ordered for daily medications. Margarito Liner

## 2013-12-07 NOTE — ED Notes (Signed)
Per EMS pt has extensive cardiac hx; pt report chest pain last evening took 2 nitro with not relieve. Pt give 3 more nitro by EMS with relief; pt states no pain on arrival. Pt slightly hypertensive 176/84 before nitro given.

## 2013-12-07 NOTE — ED Notes (Signed)
MD at bedside. 

## 2013-12-07 NOTE — ED Notes (Signed)
Per EMS pt was also give 324 mg asprin

## 2013-12-07 NOTE — ED Notes (Signed)
Attempted to call report to floor advise can not take report for about 15 mins

## 2013-12-07 NOTE — Progress Notes (Signed)
ANTICOAGULATION CONSULT NOTE - Initial Consult  Pharmacy Consult for Heparin  Indication: chest pain/ACS  Allergies  Allergen Reactions  . Zocor [Simvastatin] Other (See Comments)    Leg weakness and cramping  . Latex Hives and Rash    Patient Measurements: Height: 5' 5.35" (166 cm) Weight: 187 lb 6.3 oz (85 kg) IBW/kg (Calculated) : 57.81 Heparin Dosing Weight: 75.4 kg   Vital Signs: Temp: 97 F (36.1 C) (01/11 0841) Temp src: Axillary (01/11 0841) BP: 164/65 mmHg (01/11 0841) Pulse Rate: 76 (01/11 0841)  Labs:  Recent Labs  12/07/13 0340  HGB 12.3  HCT 35.9*  PLT 202  CREATININE 0.89  TROPONINI <0.30    Estimated Creatinine Clearance: 54.7 ml/min (by C-G formula based on Cr of 0.89).   Medical History: Past Medical History  Diagnosis Date  . Hypertension   . GERD (gastroesophageal reflux disease)   . Hypothyroidism   . Skin cancer     "both legs; right arm" (03/20/2013)  . High cholesterol   . Coronary artery disease   . Myocardial infarction     "Dr's saw evidence I might have had a heart attack" (03/20/2013)  . Pneumonia 2012  . Exertional shortness of breath   . Arthritis     "different places; not bad" (03/20/2013)  . Anxiety   . NSTEMI (non-ST elevated myocardial infarction) 03/20/2013    cath - mid RCA    Medications:  Prescriptions prior to admission  Medication Sig Dispense Refill  . aspirin 81 MG chewable tablet Chew 81 mg by mouth daily.      Marland Kitchen atorvastatin (LIPITOR) 40 MG tablet Take 20 mg by mouth daily.      . calcium gluconate 500 MG tablet Take 1 tablet by mouth daily.      . fenofibrate 54 MG tablet Take 1 tablet (54 mg total) by mouth daily.  30 tablet  5  . levothyroxine (SYNTHROID, LEVOTHROID) 88 MCG tablet Take 88 mcg by mouth daily before breakfast.      . lisinopril (PRINIVIL,ZESTRIL) 10 MG tablet TAKE 1 TABLET BY MOUTH EVERY DAY  30 tablet  5  . Magnesium Oxide 250 MG TABS Take 1 tablet by mouth daily.      . metoprolol  tartrate (LOPRESSOR) 25 MG tablet Take 12.5 mg by mouth 2 (two) times daily.      . nitroGLYCERIN (NITROSTAT) 0.4 MG SL tablet Place 1 tablet (0.4 mg total) under the tongue every 5 (five) minutes as needed for chest pain.  30 tablet  0  . omeprazole (PRILOSEC) 20 MG capsule Take 20 mg by mouth daily.      . potassium chloride SA (K-DUR,KLOR-CON) 20 MEQ tablet Take 1 tablet (20 mEq total) by mouth daily.  30 tablet  10  . prasugrel (EFFIENT) 10 MG TABS tablet Take 1 tablet (10 mg total) by mouth daily.  28 tablet  0  . aspirin 81 MG chewable tablet Chew 324 mg by mouth once.        Assessment: 60 YOF presents for evaluation of chest pain and left arm pain. She was slightly nauseated as well. Troponins negative so far. H/H 12.3/35.9. Plt wnl. EKG showed no acute wave changes. Heparin is to be started for ACS/CP    Goal of Therapy:  Heparin level 0.3-0.7 units/ml Monitor platelets by anticoagulation protocol: Yes   Plan:  1) Give heparin 4000 units IV bolus x 1 2) Start heparin infusion at 900 units/hr  3) F/u 8-hr HL at  1900 4) Monitor daily HL, CBC, and s/s of bleeding.   Albertina Parr, PharmD.  Clinical Pharmacist Pager [phone]

## 2013-12-07 NOTE — Progress Notes (Signed)
Utilization review completed.  

## 2013-12-07 NOTE — ED Provider Notes (Signed)
CSN: [id number]     Arrival date & time 12/07/13  0300 History   First MD Initiated Contact with Patient 12/07/13 0316     Chief Complaint  Patient presents with  . Chest Pain   (Consider location/radiation/quality/duration/timing/severity/associated sxs/prior Treatment) HPI 80 year old female presents emergency apartment from home via EMS with complaint of chest pain.  Patient developed chest pain.  Last night at 10:30, took 2 nitroglycerin without significant improvement in pain.  When pain persisted, she called 911.  She was given 3 additional nitroglycerin by EMS.  She took 4 baby aspirin on her home.  Upon arrival to the emergency department, patient has resolution of chest pain.  She reports chest pain was central/left-sided.  It was pressure sensation.  It radiated into her left arm with a dull throbbing sensation.  She denies diaphoresis, nausea, shortness of breath.  Symptoms were similar to her prior MI in April of this past year.  She had 3 stents placed at that time.  Patient waited several days before coming to her primary care Dr., being referred to cardiology, and was found to have an STEMI, and hypertension. She was seen by her cardiologist, Dr. Debara Pickett on Wednesday.  Normal EKG at that time.  Patient was advised that her cholesterol is high.  Patient was intolerant of Brilinta and Plavix, is currently on Effient.  Past Medical History  Diagnosis Date  . Hypertension   . GERD (gastroesophageal reflux disease)   . Hypothyroidism   . Skin cancer     "both legs; right arm" (03/20/2013)  . High cholesterol   . Anginal pain   . Coronary artery disease   . Myocardial infarction     "dr's saw evidence I might have had a heart attack" (03/20/2013)  . Pneumonia 2012  . Exertional shortness of breath   . Arthritis     "different places; not bad" (03/20/2013)  . Anxiety   . CAD (coronary artery disease)   . NSTEMI (non-ST elevated myocardial infarction) 03/20/2013    cath - mid RCA    Past Surgical History  Procedure Laterality Date  . Coronary angioplasty with stent placement  03/20/2013    NSTEMI - subtotal occlusion of mid RCA - PCI of mid RCA of long tubular 40-60% lesion - 2 tandem 95-99% subtotal occlusions - 3 overlapping Xience Xpedition DES (Dr. Roni Bread)   . Appendectomy  2003  . Dilation and curettage of uterus  1956?  Marland Kitchen Cataract extraction w/ intraocular lens  implant, bilateral Bilateral ~ 2008  . Breast biopsy Bilateral 1990s    "total of 5; 2 on one side, 3 on the other; all benign" (03/20/2013)  . Skin cancer excision      "1 off right arm; 2 off each leg" (03/20/2013)  . Transthoracic echocardiogram  03/2013    EF 123456, grade 1 diastolic dysfunction; mild MR; calcifed MV annulus; LA mildly dilated   Family History  Problem Relation Age of Onset  . Dementia Mother   . Lung cancer Father   . Cancer Brother    History  Substance Use Topics  . Smoking status: Never Smoker   . Smokeless tobacco: Never Used  . Alcohol Use: No   OB History   Grav Para Term Preterm Abortions TAB SAB Ect Mult Living                 Review of Systems  See History of Present Illness; otherwise all other systems are reviewed and negative Allergies  Latex  Home Medications   Current Outpatient Rx  Name  Route  Sig  Dispense  Refill  . acetic acid-hydrocortisone (VOSOL-HC) otic solution   Both Ears   Place 3-4 drops into both ears as needed.          Marland Kitchen aspirin 81 MG chewable tablet   Oral   Chew 81 mg by mouth daily.         Marland Kitchen atorvastatin (LIPITOR) 40 MG tablet   Oral   Take 20 mg by mouth daily.         . calcium-vitamin D (OSCAL WITH D) 500-200 MG-UNIT per tablet   Oral   Take 1 tablet by mouth daily.         . fenofibrate 54 MG tablet   Oral   Take 1 tablet (54 mg total) by mouth daily.   30 tablet   5   . levothyroxine (SYNTHROID, LEVOTHROID) 88 MCG tablet   Oral   Take 88 mcg by mouth daily before breakfast.         .  lisinopril (PRINIVIL,ZESTRIL) 10 MG tablet      TAKE 1 TABLET BY MOUTH EVERY DAY   30 tablet   5   . Magnesium Oxide 250 MG TABS   Oral   Take 1 tablet by mouth daily.         . metoprolol tartrate (LOPRESSOR) 12.5 mg TABS   Oral   Take 0.5 tablets (12.5 mg total) by mouth 2 (two) times daily.   60 tablet   5   . nitroGLYCERIN (NITROSTAT) 0.4 MG SL tablet   Sublingual   Place 1 tablet (0.4 mg total) under the tongue every 5 (five) minutes as needed for chest pain.   30 tablet   0   . omeprazole (PRILOSEC) 20 MG capsule   Oral   Take 20 mg by mouth daily.         . potassium chloride SA (K-DUR,KLOR-CON) 20 MEQ tablet   Oral   Take 1 tablet (20 mEq total) by mouth daily.   30 tablet   10   . prasugrel (EFFIENT) 10 MG TABS tablet   Oral   Take 1 tablet (10 mg total) by mouth daily.   28 tablet   0     STOP PLAVIX    BP 112/50  Pulse 58  Temp(Src) 97.3 F (36.3 C) (Oral)  Resp 18  SpO2 94% Physical Exam  Constitutional: She is oriented to person, place, and time. She appears well-developed and well-nourished.  HENT:  Head: Normocephalic and atraumatic.  Nose: Nose normal.  Mouth/Throat: Oropharynx is clear and moist.  Eyes: Conjunctivae and EOM are normal. Pupils are equal, round, and reactive to light.  Neck: Normal range of motion. Neck supple. No JVD present. No tracheal deviation present. No thyromegaly present.  Cardiovascular: Normal rate, regular rhythm, normal heart sounds and intact distal pulses.  Exam reveals no gallop and no friction rub.   No murmur heard. Pulmonary/Chest: Effort normal and breath sounds normal. No stridor. No respiratory distress. She has no wheezes. She has no rales. She exhibits no tenderness.  Abdominal: Soft. Bowel sounds are normal. She exhibits no distension and no mass. There is no tenderness. There is no rebound and no guarding.  Musculoskeletal: Normal range of motion. She exhibits edema (trace edema bilaterally  ankles). She exhibits no tenderness.  Lymphadenopathy:    She has no cervical adenopathy.  Neurological: She is alert and oriented to  person, place, and time. She exhibits normal muscle tone. Coordination normal.  Skin: Skin is warm and dry. No rash noted. No erythema. No pallor.  Psychiatric: She has a normal mood and affect. Her behavior is normal. Judgment and thought content normal.    ED Course  Procedures (including critical care time) Labs Review Labs Reviewed  CBC WITH DIFFERENTIAL - Abnormal; Notable for the following:    HCT 35.9 (*)    All other components within normal limits  BASIC METABOLIC PANEL - Abnormal; Notable for the following:    Glucose, Bld 115 (*)    GFR calc non Af Amer 60 (*)    GFR calc Af Amer 69 (*)    All other components within normal limits  TROPONIN I   Imaging Review Dg Chest 2 View  12/07/2013   CLINICAL DATA:  Chest pain, history of hypertension and COPD.  EXAM: CHEST  2 VIEW  COMPARISON:  Chest radiograph March 22, 2013  FINDINGS: Cardiac silhouette is unremarkable. Mildly calcified aortic knob. Very mild chronic interstitial changes with linear density in left lung base. The lungs are otherwise clear without pleural effusions or focal consolidations. Pulmonary vasculature is unremarkable. Trachea projects midline and there is no pneumothorax. Soft tissue planes and included osseous structures are nonsuspicious.  Mild degenerative change of thoracic spine.  IMPRESSION: Stable mild chronic interstitial changes with left lung base subsegmental atelectasis versus scarring.   Electronically Signed   By: Elon Alas   On: 12/07/2013 04:31    EKG Interpretation    Date/Time:  Sunday December 07 2013 03:13:33 EST Ventricular Rate:  61 PR Interval:  180 QRS Duration: 100 QT Interval:  434 QTC Calculation: 437 R Axis:   13 Text Interpretation:  Sinus rhythm No significant change since last tracing Confirmed by Ofelia Podolski  MD, Samuel Rittenhouse ([id number]) on 12/07/2013  3:20:58 AM            MDM   1. Chest pain   2. Hyperlipidemia   3. CAD (coronary artery disease)    80 year old female with chest pain tonight, history of cardiac disease, status post 3 stent in April.  She reports symptoms similar to her prior MI.  Plan for chest x-ray, labs, and discussion with cardiology.  She is currently pain free after 5 tablets of nitroglycerin.  She is taking full dose aspirin.      Kalman Drape, MD 12/07/13 [phone]

## 2013-12-08 ENCOUNTER — Encounter (HOSPITAL_COMMUNITY)
Admission: EM | Disposition: A | Discharge status: Home / Self Care | Payer: Self-pay | Source: Home / Self Care | Attending: Cardiology

## 2013-12-08 ENCOUNTER — Encounter (HOSPITAL_COMMUNITY): Payer: Self-pay | Admitting: General Practice

## 2013-12-08 DIAGNOSIS — I2510 Atherosclerotic heart disease of native coronary artery without angina pectoris: Secondary | ICD-10-CM

## 2013-12-08 DIAGNOSIS — E039 Hypothyroidism, unspecified: Secondary | ICD-10-CM

## 2013-12-08 DIAGNOSIS — I10 Essential (primary) hypertension: Secondary | ICD-10-CM

## 2013-12-08 HISTORY — PX: CORONARY ANGIOPLASTY: SHX604

## 2013-12-08 HISTORY — PX: LEFT HEART CATHETERIZATION WITH CORONARY ANGIOGRAM: SHX5451

## 2013-12-08 LAB — BASIC METABOLIC PANEL
BUN: 19 mg/dL (ref 6–23)
CHLORIDE: 105 meq/L (ref 96–112)
CO2: 22 meq/L (ref 19–32)
CREATININE: 0.94 mg/dL (ref 0.50–1.10)
Calcium: 9.5 mg/dL (ref 8.4–10.5)
GFR calc Af Amer: 65 mL/min — ABNORMAL LOW (ref >90)
GFR calc non Af Amer: 56 mL/min — ABNORMAL LOW (ref >90)
GLUCOSE: 105 mg/dL — AB (ref 70–99)
POTASSIUM: 4.5 meq/L (ref 3.7–5.3)
Sodium: 140 mEq/L (ref 137–147)

## 2013-12-08 LAB — CBC
HCT: 40.3 % (ref 36.0–46.0)
HEMOGLOBIN: 13.1 g/dL (ref 12.0–15.0)
MCH: 29.4 pg (ref 26.0–34.0)
MCHC: 32.5 g/dL (ref 30.0–36.0)
MCV: 90.4 fL (ref 78.0–100.0)
Platelets: 207 10*3/uL (ref 150–400)
RBC: 4.46 MIL/uL (ref 3.87–5.11)
RDW: 14.3 % (ref 11.5–15.5)
WBC: 6.3 10*3/uL (ref 4.0–10.5)

## 2013-12-08 LAB — T4, FREE: Free T4: 0.97 ng/dL (ref 0.80–1.80)

## 2013-12-08 LAB — PROTIME-INR
INR: 0.97 (ref 0.00–1.49)
Prothrombin Time: 12.7 seconds (ref 11.6–15.2)

## 2013-12-08 LAB — T3, FREE: T3, Free: 2.3 pg/mL (ref 2.3–4.2)

## 2013-12-08 LAB — TROPONIN I

## 2013-12-08 LAB — HEPARIN LEVEL (UNFRACTIONATED): Heparin Unfractionated: 0.43 IU/mL (ref 0.30–0.70)

## 2013-12-08 SURGERY — LEFT HEART CATHETERIZATION WITH CORONARY ANGIOGRAM
Anesthesia: LOCAL

## 2013-12-08 MED ORDER — MIDAZOLAM HCL 2 MG/2ML IJ SOLN
INTRAMUSCULAR | Status: AC
  Filled 2013-12-08: qty 2

## 2013-12-08 MED ORDER — SODIUM CHLORIDE 0.9 % IV SOLN: 1.0000 mL/kg/h | INTRAVENOUS | Status: AC

## 2013-12-08 MED ORDER — VERAPAMIL HCL 2.5 MG/ML IV SOLN
INTRAVENOUS | Status: AC
  Filled 2013-12-08: qty 2

## 2013-12-08 MED ORDER — NITROGLYCERIN 0.2 MG/ML ON CALL CATH LAB
INTRAVENOUS | Status: AC
  Filled 2013-12-08: qty 1

## 2013-12-08 MED ORDER — FENTANYL CITRATE 0.05 MG/ML IJ SOLN
INTRAMUSCULAR | Status: AC
  Filled 2013-12-08: qty 2

## 2013-12-08 MED ORDER — SODIUM CHLORIDE 0.9 % IJ SOLN: 3.0000 mL | INTRAMUSCULAR | Status: DC | PRN

## 2013-12-08 MED ORDER — OXYCODONE-ACETAMINOPHEN 5-325 MG PO TABS
ORAL_TABLET | ORAL | Status: AC
  Filled 2013-12-08: qty 1

## 2013-12-08 MED ORDER — HEPARIN (PORCINE) IN NACL 2-0.9 UNIT/ML-% IJ SOLN
INTRAMUSCULAR | Status: AC
  Filled 2013-12-08: qty 1000

## 2013-12-08 MED ORDER — SODIUM CHLORIDE 0.9 % IV SOLN: 250.0000 mL | INTRAVENOUS | Status: DC | PRN

## 2013-12-08 MED ORDER — HEPARIN SODIUM (PORCINE) 1000 UNIT/ML IJ SOLN
INTRAMUSCULAR | Status: AC
  Filled 2013-12-08: qty 1

## 2013-12-08 MED ORDER — LIDOCAINE HCL (PF) 1 % IJ SOLN
INTRAMUSCULAR | Status: AC
  Filled 2013-12-08: qty 30

## 2013-12-08 MED ORDER — SODIUM CHLORIDE 0.9 % IJ SOLN
3.0000 mL | Freq: Two times a day (BID) | INTRAMUSCULAR | Status: DC
  Administered 2013-12-08: 18:00:00 via INTRAVENOUS
  Administered 2013-12-09: 09:00:00 3 mL via INTRAVENOUS

## 2013-12-08 MED ORDER — OXYCODONE-ACETAMINOPHEN 5-325 MG PO TABS
1.0000 | ORAL_TABLET | ORAL | Status: DC | PRN
  Administered 2013-12-08: 1 via ORAL

## 2013-12-08 NOTE — Progress Notes (Signed)
Patient Name: Kristin Shaffer Date of Encounter: 12/08/2013  Active Problems:   Intermediate coronary syndrome   Length of Stay: 1  SUBJECTIVE  The patient feels better after cardiac cath. She is worrying about oozing from a right radial site post cath.   CURRENT MEDS . aspirin EC  81 mg Oral Daily  . atorvastatin  20 mg Oral q1800  . fenofibrate  54 mg Oral Daily  . levothyroxine  88 mcg Oral QAC breakfast  . lisinopril  10 mg Oral Daily  . magnesium oxide  200 mg Oral Daily  . metoprolol tartrate  12.5 mg Oral BID  . pantoprazole  40 mg Oral Daily  . potassium chloride SA  20 mEq Oral Daily  . prasugrel  10 mg Oral Daily  . sodium chloride  3 mL Intravenous Q12H  . zolpidem  5 mg Oral Once    OBJECTIVE  Filed Vitals:   12/08/13 0440 12/08/13 0736 12/08/13 1039 12/08/13 1207  BP: 139/56  146/51 139/42  Pulse: 63 66 63 64  Temp: 97.5 F (36.4 C)  97.4 F (36.3 C) 97.6 F (36.4 C)  TempSrc: Oral  Oral Oral  Resp: 18  18 18   Height:      Weight:      SpO2: 97%  98% 96%    Intake/Output Summary (Last 24 hours) at 12/08/13 1238 Last data filed at 12/08/13 1205  Gross per 24 hour  Intake 246.85 ml  Output   3150 ml  Net -2903.15 ml   Filed Weights   12/07/13 0900  Weight: 187 lb 6.3 oz (85 kg)    PHYSICAL EXAM  General: Pleasant, NAD. Neuro: Alert and oriented X 3. Moves all extremities spontaneously. Psych: Normal affect. HEENT:  Normal  Neck: Supple without bruits or JVD. Lungs:  Resp regular and unlabored, CTA. Heart: RRR no s3, s4, or murmurs. Abdomen: Soft, non-tender, non-distended, BS + x 4.  Extremities: No clubbing, cyanosis or edema. DP/PT/Radials 2+ and equal bilaterally. Right radial TR band, no active bleeding  Accessory Clinical Findings  CBC  Recent Labs  12/07/13 0340 12/08/13 0433  WBC 6.0 6.3  NEUTROABS 3.9  --   HGB 12.3 13.1  HCT 35.9* 40.3  MCV 89.8 90.4  PLT 202 536   Basic Metabolic Panel  Recent Labs  12/07/13 0340 12/08/13 0433  NA 140 140  K 4.4 4.5  CL 103 105  CO2 24 22  GLUCOSE 115* 105*  BUN 19 19  CREATININE 0.89 0.94  CALCIUM 9.7 9.5   Cardiac Enzymes  Recent Labs  12/07/13 1230 12/07/13 1825 12/08/13 0058  TROPONINI <0.30 <0.30 <0.30    Recent Labs  12/07/13 1230  TSH 12.545*    Radiology/Studies  Dg Chest 2 View  12/07/2013   CLINICAL DATA:  Chest pain, history of hypertension and COPD.  EXAM: CHEST  2 VIEW  COMPARISON:  Chest radiograph March 22, 2013  FINDINGS: Cardiac silhouette is unremarkable. Mildly calcified aortic knob. Very mild chronic interstitial changes with linear density in left lung base. The lungs are otherwise clear without pleural effusions or focal consolidations. Pulmonary vasculature is unremarkable. Trachea projects midline and there is no pneumothorax. Soft tissue planes and included osseous structures are nonsuspicious.  Mild degenerative change of thoracic spine.  IMPRESSION: Stable mild chronic interstitial changes with left lung base subsegmental atelectasis versus scarring.   Electronically Signed   By: Elon Alas   On: 12/07/2013 04:31     ASSESSMENT  AND PLAN  80 YO with h/o CAD, s/p PCI of the RCA in April 2014 with 3 overlapping DES in the RCA.   1. Unstable angina - s/p balloon angioplasty for in-stent restenosis in the RCA, she should continue on dual antiplatelet therapy for at least 12 months from her initial PCI procedure. Otherwise, widely patent left main, LAD, and left circumflex, normal left ventricular function If symptomatic in-stent restenosis occurs in this same area, would proceed with coronary stenting at that point, but I was reluctant to do this as an initial approach as this would involve a third layer of stent in this focal area (per cath report by Dr Burt Knack).  2. Hypothyroidism - elevated TSH, we will check fT4 and fT3  3. Hypertension - borderline, will follow for now   Signed, Ena Dawley, Lemmie Evens  MD, [facility] 12/08/2013

## 2013-12-08 NOTE — CV Procedure (Signed)
Cardiac Catheterization Procedure Note  Name: Kristin Shaffer MRN: [id number] DOB: 1931-05-21  Procedure: Left Heart Cath, Selective Coronary Angiography, LV angiography, PTCA and stenting of the RCA  Indication: 80 YO woman presented with unstable. She underwent PCI of the RCA in April 2014 with 3 overlapping DES in the RCA. Sx's are similar to those of her previous unstable angina. She was referred for cardiac catheterization and possible PCI. She is treated with aspirin and effient because of hyporesponsiveness to clopidogrel.  Procedural Details:  The right wrist was prepped, draped, and anesthetized with 1% lidocaine. Using the modified Seldinger technique, a 5/6 French sheath was introduced into the right radial artery. 3 mg of verapamil was administered through the sheath, weight-based unfractionated heparin was administered intravenously. Standard Judkins catheters were used for selective coronary angiography and left ventriculography. Catheter exchanges were performed over an exchange length guidewire.  PROCEDURAL FINDINGS Hemodynamics: AO 193/76 with a mean of 127 LV 186/23   Coronary angiography: Coronary dominance: right  Left mainstem: The left main has minimal calcification. The vessel is widely patent with no obstructive disease and trifurcates into the LAD, intermediate branch, and left circumflex.  Left anterior descending (LAD): The LAD is widely patent. There is minimal irregularity without significant stenosis. The vessel reaches the left ventricular apex. There is a medium caliber diagonal branch without significant disease.  Left circumflex (LCx): The left circumflex is normal in caliber. The mid vessel has 30% stenosis. There is a single obtuse marginal with no significant obstruction. The vessel is tortuous.  Right coronary artery (RCA): This is a dominant vessel. The ostium is severely calcified. There is approximately 40-50% ostial stenosis present. There is a  hypodensity without significant obstruction in the proximal RCA. The entire midsegment stented. The stented segment has a focal 99% stenosis in the most proximal stent at the area of overlap with the mid stent. The mid stent has 20% in-stent restenosis in the distal stent is widely patent. The distal RCA, PDA, and PLA branches are patent. There is TIMI-3 flow present.  Left ventriculography: Left ventricular systolic function is normal, LVEF is estimated at 55-65%, there is no significant mitral regurgitation   PCI Note:  Following the diagnostic procedure, the decision was made to proceed with PCI. Additional heparin was given for anticoagulation an ACT was greater than 250 s.  Once a therapeutic ACT was achieved, a 6 [place] JR4 guide catheter was inserted.  A cougar coronary guidewire was used to cross the lesion.  The lesion was predilated with a 2.5 mm balloon.  The lesion was then predilated with a 3.0 x 10 mm noncompliant balloon to 18 atmospheres on 2 inflations. Following PCI, there was 20% residual stenosis and TIMI-3 flow. Final angiography confirmed an excellent result. The patient tolerated the procedure well. There were no immediate procedural complications. A TR band was used for radial hemostasis. The patient was transferred to the post catheterization recovery area for further monitoring.  PCI Data: Vessel - RCA /Segment - Mid  Percent Stenosis (pre)  99 (in-stent)  TIMI-flow 3  Stent not used Percent Stenosis (post) 20  TIMI-flow (post) 3   Final Conclusions:   1. Severe single-vessel coronary artery disease secondary to in-stent restenosis in the RCA, treated successfully with balloon angioplasty  2. Widely patent left main, LAD, and left circumflex  3. Normal left ventricular function    Recommendations:  The patient was treated with balloon angioplasty because the area of in-stent restenosis appeared to  involve an area of stent overlap in the pattern of restenosis is focal.  She should continue on dual antiplatelet therapy for at least 12 months from her initial PCI procedure. If symptomatic in-stent restenosis occurs in this same area, would proceed with coronary stenting at that point, but I was reluctant to do this as an initial approach as this would involve a third layer of stent in this focal area.   Sherren Mocha 12/08/2013, 9:03 AM

## 2013-12-08 NOTE — Progress Notes (Signed)
TR BAND REMOVAL  LOCATION:    right radial  DEFLATED PER PROTOCOL:    yes  TIME BAND OFF / DRESSING APPLIED:    1415   SITE UPON ARRIVAL:    Level 0  SITE AFTER BAND REMOVAL:    Level 0  REVERSE ALLEN'S TEST:     positive  CIRCULATION SENSATION AND MOVEMENT:    Within Normal Limits   yes  COMMENTS:   Tolerated procedure well, small bruise , posteriior wrist with slight  swelling but soft, no palpable hematoma, good capillary refill

## 2013-12-08 NOTE — Progress Notes (Signed)
Pt transported to Cath Lab. Hep gtt d/c'd as ordered.

## 2013-12-08 NOTE — Interval H&P Note (Signed)
History and Physical Interval Note:  12/08/2013 7:53 AM  Kristin Shaffer  has presented today for surgery, with the diagnosis of cp  The various methods of treatment have been discussed with the patient and family. After consideration of risks, benefits and other options for treatment, the patient has consented to  Procedure(s): LEFT HEART CATHETERIZATION WITH CORONARY ANGIOGRAM (N/A) as a surgical intervention .  The patient's history has been reviewed, patient examined, no change in status, stable for surgery.  I have reviewed the patient's chart and labs.  Questions were answered to the patient's satisfaction.    Cath Lab Visit (complete for each Cath Lab visit)  Clinical Evaluation Leading to the Procedure:   ACS: yes  Non-ACS:    Anginal Classification: CCS IV  Anti-ischemic medical therapy: Minimal Therapy (1 class of medications)  Non-Invasive Test Results: No non-invasive testing performed  Prior CABG: No previous CABG       Sherren Mocha

## 2013-12-08 NOTE — Progress Notes (Signed)
Patient c/o chest pain 6/10 after returning to bed from the bathroom.   02 @4L  initiated, EKG done, Nitro SL  X 2 administered with good effect. B/P 139/56 HR 63 RR 18  02 sat 97 % . Pt is free of chest pain and is resting comfortable in bed. We will inform MD.

## 2013-12-09 DIAGNOSIS — I200 Unstable angina: Secondary | ICD-10-CM

## 2013-12-09 LAB — CBC
HCT: 37.6 % (ref 36.0–46.0)
Hemoglobin: 12.5 g/dL (ref 12.0–15.0)
MCH: 30 pg (ref 26.0–34.0)
MCHC: 33.2 g/dL (ref 30.0–36.0)
MCV: 90.4 fL (ref 78.0–100.0)
PLATELETS: 201 10*3/uL (ref 150–400)
RBC: 4.16 MIL/uL (ref 3.87–5.11)
RDW: 14.4 % (ref 11.5–15.5)
WBC: 5.8 10*3/uL (ref 4.0–10.5)

## 2013-12-09 LAB — BASIC METABOLIC PANEL
BUN: 20 mg/dL (ref 6–23)
CHLORIDE: 106 meq/L (ref 96–112)
CO2: 23 meq/L (ref 19–32)
Calcium: 9.2 mg/dL (ref 8.4–10.5)
Creatinine, Ser: 0.95 mg/dL (ref 0.50–1.10)
GFR calc Af Amer: 64 mL/min — ABNORMAL LOW (ref >90)
GFR calc non Af Amer: 55 mL/min — ABNORMAL LOW (ref >90)
Glucose, Bld: 103 mg/dL — ABNORMAL HIGH (ref 70–99)
POTASSIUM: 4.5 meq/L (ref 3.7–5.3)
SODIUM: 140 meq/L (ref 137–147)

## 2013-12-09 LAB — POCT ACTIVATED CLOTTING TIME: ACTIVATED CLOTTING TIME: 271 s

## 2013-12-09 NOTE — Progress Notes (Signed)
Subjective: No complaints   Objective: Vital signs in last 24 hours: Temp:  [97.4 F (36.3 C)-98 F (36.7 C)] 97.6 F (36.4 C) (01/13 0533) Pulse Rate:  [59-69] 69 (01/13 0533) Resp:  [16-18] 18 (01/13 0533) BP: (126-166)/(30-115) 166/50 mmHg (01/13 0533) SpO2:  [94 %-99 %] 97 % (01/13 0533) Weight:  [186 lb 15.2 oz (84.8 kg)] 186 lb 15.2 oz (84.8 kg) (01/13 0019) Last BM Date: 12/06/13  Intake/Output from previous day: 01/12 0701 - 01/13 0700 In: 1191.2 [P.O.:480; I.V.:711.2] Out: 1200 [Urine:1200] Intake/Output this shift: Total I/O In: 360 [P.O.:360] Out: -   Medications Current Facility-Administered Medications  Medication Dose Route Frequency Provider Last Rate Last Dose  . 0.9 %  sodium chloride infusion  250 mL Intravenous PRN Sherren Mocha, MD      . acetaminophen (TYLENOL) tablet 650 mg  650 mg Oral Q4H PRN Minus Breeding, MD      . aspirin EC tablet 81 mg  81 mg Oral Daily Minus Breeding, MD   81 mg at 12/08/13 [phone]  . atorvastatin (LIPITOR) tablet 20 mg  20 mg Oral q1800 Minus Breeding, MD   20 mg at 12/08/13 1808  . fenofibrate tablet 54 mg  54 mg Oral Daily Minus Breeding, MD   54 mg at 12/08/13 1300  . levothyroxine (SYNTHROID, LEVOTHROID) tablet 88 mcg  88 mcg Oral QAC breakfast Minus Breeding, MD   88 mcg at 12/08/13 [phone]  . lisinopril (PRINIVIL,ZESTRIL) tablet 10 mg  10 mg Oral Daily Minus Breeding, MD   10 mg at 12/08/13 1300  . magnesium oxide (MAG-OX) tablet 200 mg  200 mg Oral Daily Minus Breeding, MD   200 mg at 12/07/13 1120  . metoprolol tartrate (LOPRESSOR) tablet 12.5 mg  12.5 mg Oral BID Minus Breeding, MD   12.5 mg at 12/08/13 1343  . nitroGLYCERIN (NITROSTAT) SL tablet 0.4 mg  0.4 mg Sublingual Q5 min PRN Minus Breeding, MD   0.4 mg at 12/08/13 0445  . ondansetron (ZOFRAN) injection 4 mg  4 mg Intravenous Q6H PRN Minus Breeding, MD      . oxyCODONE-acetaminophen (PERCOCET/ROXICET) 5-325 MG per tablet 1-2 tablet  1-2 tablet Oral Q4H PRN  Sherren Mocha, MD   1 tablet at 12/08/13 0950  . pantoprazole (PROTONIX) EC tablet 40 mg  40 mg Oral Daily Minus Breeding, MD   40 mg at 12/08/13 1345  . potassium chloride SA (K-DUR,KLOR-CON) CR tablet 20 mEq  20 mEq Oral Daily Minus Breeding, MD   20 mEq at 12/08/13 1343  . prasugrel (EFFIENT) tablet 10 mg  10 mg Oral Daily Minus Breeding, MD   10 mg at 12/08/13 [phone]  . sodium chloride 0.9 % injection 3 mL  3 mL Intravenous Q12H Sherren Mocha, MD      . sodium chloride 0.9 % injection 3 mL  3 mL Intravenous PRN Sherren Mocha, MD      . zolpidem Lorrin Mais) tablet 5 mg  5 mg Oral Once Jacolyn Reedy, MD        PE: General appearance: alert, cooperative and no distress Lungs: clear to auscultation bilaterally Heart: regular rate and rhythm, S1, S2 normal, grade 2/6 systolic murmur at the LSB Extremities: No LEE Pulses: Radials 2+. 1+ PTs Skin: Right wrist:  no ecchymosis or hematoma. Neurologic: Grossly normal  Lab Results:   Recent Labs  12/07/13 0340 12/08/13 0433 12/09/13 0455  WBC 6.0 6.3 5.8  HGB 12.3 13.1 12.5  HCT 35.9* 40.3 37.6  PLT 202 207 201   BMET  Recent Labs  12/07/13 0340 12/08/13 0433 12/09/13 0455  NA 140 140 140  K 4.4 4.5 4.5  CL 103 105 106  CO2 24 22 23   GLUCOSE 115* 105* 103*  BUN 19 19 20   CREATININE 0.89 0.94 0.95  CALCIUM 9.7 9.5 9.2   PT/INR  Recent Labs  12/08/13 0451  LABPROT 12.7  INR 0.97    Assessment/Plan  Active Problems:   Intermediate coronary syndrome  Plan:  SP coronary angiogram revealing in-stent restenosis in the RCA and treated with balloon angioplasty.  Normal EF. Widely patent left main, LAD, and left circumflex.  ASA and Effient for 12 months.  TSH elevated. FreeT3/4 WNL.   EKG:  NSR. Lisinopril 10, lopressor 12.5bid.  BP elevated this morning but for the most part only borderline HTN.  Follow up in the office.  Dc home today.   LOS: 2 days    HAGER, BRYAN 12/09/2013 6:47 AM  Patient seen, examined.  Available data reviewed. Agree with findings, assessment, and plan as outlined by Tarri Fuller, PA-C. The patient is stable following PCI of the right coronary artery with balloon angioplasty alone to treat severe in-stent restenosis. She should continue on aspirin and effient. She was independently examined. Her right radial site is intact with no significant hematoma or tenderness. Lungs are clear and heart is RRR with grade 2/6 systolic murmur at the LSB. Post PCI instructions reviewed in detail with the patient. Otherwise as above. Follow-up with Dr Debara Pickett.  Sherren Mocha, M.D. 12/09/2013 7:44 AM

## 2013-12-09 NOTE — Discharge Summary (Signed)
Physician Discharge Summary  Patient ID: Kristin Shaffer MRN: [id number] DOB/AGE: 1931-03-03 80 y.o.  Admit date: 12/07/2013 Discharge date: 12/09/2013 Cardiologist:  Hilty  Admission Diagnoses:   Unstable angina  Discharge Diagnoses:  Principal Problem:   Unstable angina Active Problems:   Hypothyroidism   Hyperlipidemia   Intermediate coronary syndrome   Discharged Condition: stable  Hospital Course:  The patient is an 80 yo female with a history of PCI in April of last year. She had been doing well. She was noted to be inadequately reactive to Plavix and was treated with Effient at the last visit with Dr. Debara Pickett. Tonight after getting ready for bed she had left arm pain and chest pain similar to her previous angina. She took NTG x 2 at home without relief. She called EMS. They did give her 3 more NTG and her pain resolved. Since being in the ER she has had no further pain. Her pain was throbbing. It was 6/10. She was slightly nauseated and could not get comfortable. She had otherwise been doing fine. The patient denies any PND or orthopnea. There have been no reported palpitations, presyncope or syncope. She was walking daily until it got cold.   The patient was admitted and started on IV heparin.  Troponin was cycled and negative.  She was taken for left heart cath which revealed severe single-vessel coronary artery disease secondary to in-stent restenosis in the RCA, treated successfully with balloon angioplasty.  Widely patent left main, LAD, and left circumflex.  Normal LVF.  ASA and effient for 12 months.  TSH was elevated but free t3/4 were WNL.  The patient was seen by Dr. Burt Knack who felt she was stable for DC home.      Consults: None  Significant Diagnostic Studies:   PROCEDURAL FINDINGS  Hemodynamics:  AO 193/76 with a mean of 127  LV 186/23  Coronary angiography:  Coronary dominance: right  Left mainstem: The left main has minimal calcification. The vessel is widely  patent with no obstructive disease and trifurcates into the LAD, intermediate branch, and left circumflex.  Left anterior descending (LAD): The LAD is widely patent. There is minimal irregularity without significant stenosis. The vessel reaches the left ventricular apex. There is a medium caliber diagonal branch without significant disease.  Left circumflex (LCx): The left circumflex is normal in caliber. The mid vessel has 30% stenosis. There is a single obtuse marginal with no significant obstruction. The vessel is tortuous.  Right coronary artery (RCA): This is a dominant vessel. The ostium is severely calcified. There is approximately 40-50% ostial stenosis present. There is a hypodensity without significant obstruction in the proximal RCA. The entire midsegment stented. The stented segment has a focal 99% stenosis in the most proximal stent at the area of overlap with the mid stent. The mid stent has 20% in-stent restenosis in the distal stent is widely patent. The distal RCA, PDA, and PLA branches are patent. There is TIMI-3 flow present.  Left ventriculography: Left ventricular systolic function is normal, LVEF is estimated at 55-65%, there is no significant mitral regurgitation  PCI Note: Following the diagnostic procedure, the decision was made to proceed with PCI. Additional heparin was given for anticoagulation an ACT was greater than 250 s. Once a therapeutic ACT was achieved, a 6 [place] JR4 guide catheter was inserted. A cougar coronary guidewire was used to cross the lesion. The lesion was predilated with a 2.5 mm balloon. The lesion was then predilated with a 3.0 x  10 mm noncompliant balloon to 18 atmospheres on 2 inflations. Following PCI, there was 20% residual stenosis and TIMI-3 flow. Final angiography confirmed an excellent result. The patient tolerated the procedure well. There were no immediate procedural complications. A TR band was used for radial hemostasis. The patient was transferred  to the post catheterization recovery area for further monitoring.  PCI Data:  Vessel - RCA /Segment - Mid  Percent Stenosis (pre) 99 (in-stent)  TIMI-flow 3  Stent not used  Percent Stenosis (post) 20  TIMI-flow (post) 3  Final Conclusions:  1. Severe single-vessel coronary artery disease secondary to in-stent restenosis in the RCA, treated successfully with balloon angioplasty  2. Widely patent left main, LAD, and left circumflex  3. Normal left ventricular function  Recommendations:  The patient was treated with balloon angioplasty because the area of in-stent restenosis appeared to involve an area of stent overlap in the pattern of restenosis is focal. She should continue on dual antiplatelet therapy for at least 12 months from her initial PCI procedure. If symptomatic in-stent restenosis occurs in this same area, would proceed with coronary stenting at that point, but I was reluctant to do this as an initial approach as this would involve a third layer of stent in this focal area.  Sherren Mocha  12/08/2013, 9:03 AM   Treatments: See above  Discharge Exam: Blood pressure 166/50, pulse 69, temperature 97.6 F (36.4 C), temperature source Oral, resp. rate 18, height 5' 5.35" (1.66 m), weight 186 lb 15.2 oz (84.8 kg), SpO2 97.00%.   Disposition: 01-Home or Self Care      Discharge Orders   Future Appointments Provider Department Dept Phone   12/22/2013 3:15 PM Pixie Casino, MD [facility] [phone]   Future Orders Complete By Expires   Diet - low sodium heart healthy  As directed    Discharge instructions  As directed    Comments:     No Lifting with your right arm for three days.   Increase activity slowly  As directed        Medication List         aspirin 81 MG chewable tablet  Chew 81 mg by mouth daily.     atorvastatin 40 MG tablet  Commonly known as:  LIPITOR  Take 20 mg by mouth daily.     calcium gluconate 500 MG tablet  Take 1 tablet by  mouth daily.     fenofibrate 54 MG tablet  Take 1 tablet (54 mg total) by mouth daily.     levothyroxine 88 MCG tablet  Commonly known as:  SYNTHROID, LEVOTHROID  Take 88 mcg by mouth daily before breakfast.     lisinopril 10 MG tablet  Commonly known as:  PRINIVIL,ZESTRIL  TAKE 1 TABLET BY MOUTH EVERY DAY     Magnesium Oxide 250 MG Tabs  Take 1 tablet by mouth daily.     metoprolol tartrate 25 MG tablet  Commonly known as:  LOPRESSOR  Take 12.5 mg by mouth 2 (two) times daily.     nitroGLYCERIN 0.4 MG SL tablet  Commonly known as:  NITROSTAT  Place 1 tablet (0.4 mg total) under the tongue every 5 (five) minutes as needed for chest pain.     omeprazole 20 MG capsule  Commonly known as:  PRILOSEC  Take 20 mg by mouth daily.     potassium chloride SA 20 MEQ tablet  Commonly known as:  K-DUR,KLOR-CON  Take 1 tablet (20 mEq total)  by mouth daily.     prasugrel 10 MG Tabs tablet  Commonly known as:  EFFIENT  Take 1 tablet (10 mg total) by mouth daily.       Follow-up Information   Follow up with Pixie Casino, MD. (12/22/13 at 3:15pm)    Specialty:  Cardiology   Contact information:   [address] [address] [phone]       Signed: Tarri Fuller 12/09/2013, 8:10 AM

## 2013-12-09 NOTE — Progress Notes (Signed)
CARDIAC REHAB PHASE I   PRE:  Rate/Rhythm: 61SR  BP:  Supine: 61SR  Sitting: 160/38  Standing:    SaO2:   MODE:  Ambulation: 500 ft   POST:  Rate/Rhythm: 68SR  BP:  Supine:   Sitting: 196/42, 208/49  Standing:    SaO2:  4010-2725 Pt walked 500 ft with steady gait with minimal asst. BP elevated after walk. Re check still elevated. RN to give BP meds this a.m. And recheck. Education completed with pt. I educated her in April so we reviewed. Did not attend CRP 2 before and does not want to attend now. Wants to exercise on her own. Gave effient packet. To sitting on side of bed after walk.   Graylon Good, RN BSN  12/09/2013 9:09 AM

## 2013-12-11 ENCOUNTER — Telehealth: Payer: Self-pay | Admitting: Internal Medicine

## 2013-12-11 NOTE — Telephone Encounter (Signed)
Returned call and pt verified x 2.  Pt informed per Dr. Debara Pickett.  Pt verbalized understanding and agreed w/ plan.  Pt agreed to report to lab fasting.'  Order already in [place].  Canceled TSH and T4.

## 2013-12-11 NOTE — Telephone Encounter (Signed)
She wants to know if she still need to get her Cholesterol checked she just got out of the hospital on Tuesday(12-09-13)?Marland Kitchen

## 2013-12-11 NOTE — Telephone Encounter (Signed)
Message forwarded to Dr. Debara Pickett to advised if pt needs to complete all labs or just NMR lipoprofile.

## 2013-12-11 NOTE — Telephone Encounter (Signed)
Just the cholesterol test is ok.  -Dr. Debara Pickett

## 2013-12-16 ENCOUNTER — Other Ambulatory Visit: Payer: Self-pay | Admitting: Internal Medicine

## 2013-12-17 LAB — NMR LIPOPROFILE WITH LIPIDS
CHOLESTEROL, TOTAL: 203 mg/dL — AB (ref <200)
HDL Particle Number: 42.8 umol/L (ref >=30.5)
HDL Size: 9.5 nm (ref >=9.2)
HDL-C: 76 mg/dL (ref >=40)
LARGE VLDL-P: 3.7 nmol/L — AB (ref <=2.7)
LDL CALC: 109 mg/dL — AB (ref <100)
LDL PARTICLE NUMBER: 1283 nmol/L — AB (ref <1000)
LDL Size: 21.6 nm (ref >20.5)
LP-IR SCORE: 37 (ref <=45)
Large HDL-P: 8.8 umol/L (ref >=4.8)
Small LDL Particle Number: 379 nmol/L (ref <=527)
TRIGLYCERIDES: 88 mg/dL (ref <150)
VLDL SIZE: 51.2 nm — AB (ref <=46.6)

## 2013-12-17 LAB — T3, FREE: T3 FREE: 2.6 pg/mL (ref 2.3–4.2)

## 2013-12-17 LAB — TSH: TSH: 10.882 u[IU]/mL — AB (ref 0.350–4.500)

## 2013-12-17 LAB — T4, FREE: FREE T4: 1.13 ng/dL (ref 0.80–1.80)

## 2013-12-22 ENCOUNTER — Ambulatory Visit (INDEPENDENT_AMBULATORY_CARE_PROVIDER_SITE_OTHER): Payer: Medicare Other | Admitting: Internal Medicine

## 2013-12-22 ENCOUNTER — Encounter: Payer: Self-pay | Admitting: Internal Medicine

## 2013-12-22 VITALS — BP 128/60 | HR 59 | Ht 65.5 in | Wt 184.4 lb

## 2013-12-22 DIAGNOSIS — Z955 Presence of coronary angioplasty implant and graft: Secondary | ICD-10-CM

## 2013-12-22 DIAGNOSIS — Z9861 Coronary angioplasty status: Secondary | ICD-10-CM

## 2013-12-22 DIAGNOSIS — I2510 Atherosclerotic heart disease of native coronary artery without angina pectoris: Secondary | ICD-10-CM

## 2013-12-22 DIAGNOSIS — I200 Unstable angina: Secondary | ICD-10-CM

## 2013-12-22 DIAGNOSIS — E785 Hyperlipidemia, unspecified: Secondary | ICD-10-CM

## 2013-12-22 MED ORDER — EZETIMIBE 10 MG PO TABS: 10.0000 mg | ORAL_TABLET | Freq: Every day | ORAL | Status: DC

## 2013-12-22 NOTE — Patient Instructions (Addendum)
Your physician recommends that you return for lab work in: 3 months - you will need to be fasting (nothing to eat/drink after midnight)  Your physician recommends that you schedule a follow-up appointment in: 3 months with Dr. Debara Pickett.  START zetia 10mg  once daily. TAKE 40mg  of atorvastatin daily.

## 2013-12-23 ENCOUNTER — Encounter: Payer: Self-pay | Admitting: Internal Medicine

## 2013-12-23 NOTE — Progress Notes (Signed)
OFFICE NOTE  Chief Complaint:  Routine followup  Primary Care Physician: Stephens Shire, MD  HPI:  Kristin Shaffer is an 80 year old Caucasian female with a history of hyperlipidemia and hypothyroidism. She was seen by myself on March 20, 2013, and at that time was found to be in hypertensive urgency with a blood pressure of 230/94. She was admitted to [facility] for blood pressure control and cardiac catheterization for unstable angina. She did in fact have a non-ST-elevation myocardial infarction with a mildly elevated troponin of 0.67. She underwent coronary angiography, which revealed a subtotal occlusion of the mid right coronary artery. She then underwent a successful complex PCI of the entire mid RCA encompassing a long tubular 40% to 60% lesion that preceded 2 tandem 95% and 99% subtotal occlusions. This was completed using 3 overlapping Xience Xpedition drug-eluting stents. She was placed on aspirin and Brilinta, and was discharged. She subsequently returned to [facility] on April 26 to the emergency room with chest pain. Patient states that it was a "sharp, stabbing, and burning" pain. It was 10 out of 10 in intensity. She took 4 baby aspirin at home, but did not have any nitroglycerin. EMS gave her one, which provided her some relief, and midway to the ER, they gave her another one, which essentially resolved the pain. Patient reports feeling weak, but that is improving compared to when she was discharged. She has had no chest pain since the episode on the 26th. She was provided with a prescription at the ER for sublingual nitroglycerin. She did undergo an echocardiogram which showed preserved LV systolic function and an EF of 60-65%. There was stage I diastolic dysfunction and aortic sclerosis with mild central aortic regurgitation. Otherwise no significant abnormalities.  She also had carotid and renal Dopplers. The carotid Dopplers indicated a small amount of bilateral plaque.  The renal Dopplers did not indicate any renal artery stenosis.  Recently she had lipid profile performed which showed a particle number of 1783, with LDL C. of 143.  Based on this I recommended increasing her Lipitor from 20-40 mg.  She did make this change, but as noted pain and weakness in her joints, especially when she wakes up in the morning which takes about 30 minutes to improve. This sounds a lot like arthritis, but she really feels it is related to her cholesterol medicine.  Overall, at her last office visit I felt she was doing fairly well. Unfortunately about 5 days after that visit she started to have acute onset chest discomfort with radiation of pain into her left arm. She presented to the emergency department and was found to have unstable angina. Cardiac catheterization was performed which demonstrated 99% in-stent stenosis at the previously placed mid RCA stent.  I am interested as to why she presented with acute unstable angina, and not progressive anginal symptoms with exertion. She certainly had absolutely no chest pain or arm pain just a few days prior to the presentation, which is more consistent with an acute coronary syndrome.  PMHx:  Past Medical History  Diagnosis Date  . Hypertension   . GERD (gastroesophageal reflux disease)   . Hypothyroidism   . Skin cancer     "both legs; right arm" (03/20/2013)  . High cholesterol   . Coronary artery disease   . Myocardial infarction     "Dr's saw evidence I might have had a heart attack" (03/20/2013)  . Pneumonia 2012  . Exertional shortness of breath   .  Arthritis     "different places; not bad" (03/20/2013)  . Anxiety   . NSTEMI (non-ST elevated myocardial infarction) 03/20/2013    cath - mid RCA    Past Surgical History  Procedure Laterality Date  . Appendectomy  2003  . Dilation and curettage of uterus  1956?  Marland Kitchen Cataract extraction w/ intraocular lens  implant, bilateral Bilateral ~ 2008  . Breast biopsy Bilateral  1990s    "total of 5; 2 on one side, 3 on the other; all benign" (03/20/2013)  . Skin cancer excision      "1 off right arm; 2 off each leg" (03/20/2013)  . Transthoracic echocardiogram  03/2013    EF 94-70%, grade 1 diastolic dysfunction; mild MR; calcifed MV annulus; LA mildly dilated  . Coronary angioplasty with stent placement  03/20/2013    NSTEMI - subtotal occlusion of mid RCA - PCI of mid RCA of long tubular 40-60% lesion - 2 tandem 95-99% subtotal occlusions - 3 overlapping Xience Xpedition DES (Dr. Roni Bread)   . Coronary angioplasty  12/08/2013    FAMHx:  Family History  Problem Relation Age of Onset  . Dementia Mother   . Lung cancer Father   . Cancer Brother   . CAD Father 78    SOCHx:   reports that she has never smoked. She has never used smokeless tobacco. She reports that she does not drink alcohol or use illicit drugs.  ALLERGIES:  Allergies  Allergen Reactions  . Zocor [Simvastatin] Other (See Comments)    Leg weakness and cramping  . Latex Hives and Rash    ROS: A comprehensive review of systems was negative except for: Musculoskeletal: positive for muscle weakness  HOME MEDS: Current Outpatient Prescriptions  Medication Sig Dispense Refill  . aspirin 81 MG chewable tablet Chew 81 mg by mouth daily.      Marland Kitchen atorvastatin (LIPITOR) 40 MG tablet Take 40 mg by mouth daily.       . calcium gluconate 500 MG tablet Take 1 tablet by mouth daily.      . fenofibrate 54 MG tablet Take 1 tablet (54 mg total) by mouth daily.  30 tablet  5  . levothyroxine (SYNTHROID, LEVOTHROID) 88 MCG tablet Take 88 mcg by mouth daily before breakfast.      . lisinopril (PRINIVIL,ZESTRIL) 10 MG tablet TAKE 1 TABLET BY MOUTH EVERY DAY  30 tablet  5  . Magnesium Oxide 250 MG TABS Take 1 tablet by mouth daily.      . metoprolol tartrate (LOPRESSOR) 25 MG tablet Take 12.5 mg by mouth 2 (two) times daily.      . nitroGLYCERIN (NITROSTAT) 0.4 MG SL tablet Place 1 tablet (0.4 mg total) under  the tongue every 5 (five) minutes as needed for chest pain.  30 tablet  0  . omeprazole (PRILOSEC) 20 MG capsule Take 20 mg by mouth daily.      . potassium chloride SA (K-DUR,KLOR-CON) 20 MEQ tablet Take 1 tablet (20 mEq total) by mouth daily.  30 tablet  10  . prasugrel (EFFIENT) 10 MG TABS tablet Take 1 tablet (10 mg total) by mouth daily.  28 tablet  0  . ezetimibe (ZETIA) 10 MG tablet Take 1 tablet (10 mg total) by mouth daily.  30 tablet  6   No current facility-administered medications for this visit.    LABS/IMAGING: No results found for this or any previous visit (from the past 48 hour(s)). No results found.  VITALS: BP  128/60  Pulse 59  Ht 5' 5.5" (1.664 m)  Wt 184 lb 6.4 oz (83.643 kg)  BMI 30.21 kg/m2  EXAM: General appearance: alert and no distress Neck: no adenopathy, no carotid bruit, no JVD, supple, symmetrical, trachea midline and thyroid not enlarged, symmetric, no tenderness/mass/nodules Lungs: clear to auscultation bilaterally Heart: regular rate and rhythm, S1, S2 normal and diastolic murmur: early diastolic 2/6, crescendo at 2nd left intercostal space Abdomen: soft, non-tender; bowel sounds normal; no masses,  no organomegaly Extremities: extremities normal, atraumatic, no cyanosis or edema Pulses: 2+ and symmetric Skin: Skin color, texture, turgor normal. No rashes or lesions Neurologic: Grossly normal  EKG: Sinus bradycardia at 59  ASSESSMENT: 1. Coronary artery disease status post recent and STEMI, PCI to the RCA with 4 overlapping Xience drug-eluting stents - now with recent UA and ISR 2. Hypertension 3. Hypothyroidism 4. GERD 5. Dyspnea, possibly secondary to Brillinta 6. Dyslipidemia - statin intolerance  PLAN: 1.   Mrs. Crute unfortunately recently presented with acute chest pain and was found to have 99% in-stent restenosis.  She underwent cutting balloon angioplasty with a good result and her chest pain is improved. This would argue that the  mechanism was probably fibrosis/stenosis and not unstable angina, however per his presentation was acute, and she did not have any progressive anginal symptoms. It could be that she had upstream plaque embolization into the restenotic stent. Nevertheless, I do feel that she'll need better cholesterol control based on her recent lipid studies. I recommended she stay on atorvastatin 40 mg and add Zetia to her regimen.  Recheck a lipid profile in 3 months.  Pixie Casino, MD, [facility] Attending Cardiologist [facility] [place] [facility] 12/23/2013, 5:13 PM

## 2014-01-20 ENCOUNTER — Telehealth: Payer: Self-pay | Admitting: Internal Medicine

## 2014-01-20 ENCOUNTER — Other Ambulatory Visit: Payer: Self-pay | Admitting: Internal Medicine

## 2014-01-20 NOTE — Telephone Encounter (Signed)
Rx was sent to pharmacy electronically. 

## 2014-01-20 NOTE — Telephone Encounter (Signed)
Would like the results of her last thyroid test please. She need to know if her thyroid medicine will need to be changed. Please let her know asap.

## 2014-01-20 NOTE — Telephone Encounter (Signed)
Returned call to patient. Instructed that if PCP doses her thyroid medication, he should be the one to refill this. Will forward to PCP and mail copy of labs to patient

## 2014-03-10 ENCOUNTER — Telehealth: Payer: Self-pay | Admitting: Internal Medicine

## 2014-03-10 MED ORDER — EZETIMIBE 10 MG PO TABS: 10.0000 mg | ORAL_TABLET | Freq: Every day | ORAL | Status: DC

## 2014-03-10 MED ORDER — METOPROLOL TARTRATE 25 MG PO TABS: 12.5000 mg | ORAL_TABLET | Freq: Two times a day (BID) | ORAL | Status: DC

## 2014-03-10 MED ORDER — ATORVASTATIN CALCIUM 40 MG PO TABS: ORAL_TABLET | ORAL | Status: DC

## 2014-03-10 MED ORDER — LISINOPRIL 10 MG PO TABS: ORAL_TABLET | ORAL | Status: DC

## 2014-03-10 MED ORDER — PRASUGREL HCL 10 MG PO TABS: 10.0000 mg | ORAL_TABLET | Freq: Every day | ORAL | Status: DC

## 2014-03-10 NOTE — Telephone Encounter (Signed)
Needs a lab order sent to [facility], In lab to have lab work done before her appt on 03/26/14.. Thanks

## 2014-03-10 NOTE — Telephone Encounter (Signed)
Returned call.  No answer/voicemail.  Will await call back from pt. 

## 2014-03-10 NOTE — Telephone Encounter (Signed)
[facility] says they have not gotten reply for request for her meds  Please call

## 2014-03-10 NOTE — Telephone Encounter (Signed)
Rx was sent to pharmacy electronically. 

## 2014-03-10 NOTE — Telephone Encounter (Signed)
Returned call.  No answer/voicemail.  Will try later.   LABS ORDERED AT LAST OV AND PT SHOULD HAVE LAB SLIP.  SHE CAN PRESENT TO ANY [facility] TO HAVE DRAWN (FASTING) BEFORE HER APPT.

## 2014-03-13 ENCOUNTER — Telehealth: Payer: Self-pay | Admitting: Internal Medicine

## 2014-03-13 MED ORDER — PRASUGREL HCL 10 MG PO TABS: 10.0000 mg | ORAL_TABLET | Freq: Every day | ORAL | Status: DC

## 2014-03-13 NOTE — Telephone Encounter (Signed)
Please have Jenna call her. She have been working on getting her Effient straight.

## 2014-03-13 NOTE — Telephone Encounter (Signed)
Patient called in asking for sample of effient until supply from mail order arrives. 14 day sample supply left at front desk.

## 2014-03-13 NOTE — Telephone Encounter (Signed)
Forwarded message to [place] for review.

## 2014-03-16 ENCOUNTER — Telehealth: Payer: Self-pay | Admitting: Internal Medicine

## 2014-03-16 NOTE — Telephone Encounter (Signed)
Lab order placed at last OV. Attempted to contact patient. No answer. Will re-print lab slip and put with samples at front desk.

## 2014-03-16 NOTE — Telephone Encounter (Signed)
Coming in town today. She would like to have her lab work today,would you please send an order downstairs asap.

## 2014-03-17 LAB — NMR LIPOPROFILE WITH LIPIDS
Cholesterol, Total: 164 mg/dL (ref <200)
HDL PARTICLE NUMBER: 42.7 umol/L (ref >=30.5)
HDL Size: 9.6 nm (ref >=9.2)
HDL-C: 75 mg/dL (ref >=40)
LARGE HDL: 10.8 umol/L (ref >=4.8)
LDL CALC: 72 mg/dL (ref <100)
LDL Particle Number: 892 nmol/L (ref <1000)
LDL SIZE: 20.6 nm (ref >20.5)
LP-IR SCORE: 39 (ref <=45)
Large VLDL-P: 3.4 nmol/L — ABNORMAL HIGH (ref <=2.7)
SMALL LDL PARTICLE NUMBER: 401 nmol/L (ref <=527)
Triglycerides: 86 mg/dL (ref <150)
VLDL Size: 49.4 nm — ABNORMAL HIGH (ref <=46.6)

## 2014-03-18 NOTE — Telephone Encounter (Signed)
CLOSED ENCOUNTER BY TINA P

## 2014-03-26 ENCOUNTER — Encounter: Payer: Self-pay | Admitting: Internal Medicine

## 2014-03-26 ENCOUNTER — Ambulatory Visit (INDEPENDENT_AMBULATORY_CARE_PROVIDER_SITE_OTHER): Payer: Medicare Other | Admitting: Internal Medicine

## 2014-03-26 VITALS — BP 144/72 | HR 57 | Ht 65.0 in | Wt 175.2 lb

## 2014-03-26 DIAGNOSIS — I214 Non-ST elevation (NSTEMI) myocardial infarction: Secondary | ICD-10-CM

## 2014-03-26 DIAGNOSIS — I10 Essential (primary) hypertension: Secondary | ICD-10-CM

## 2014-03-26 DIAGNOSIS — Z9861 Coronary angioplasty status: Secondary | ICD-10-CM

## 2014-03-26 DIAGNOSIS — I160 Hypertensive urgency: Secondary | ICD-10-CM

## 2014-03-26 DIAGNOSIS — I200 Unstable angina: Secondary | ICD-10-CM

## 2014-03-26 DIAGNOSIS — Z955 Presence of coronary angioplasty implant and graft: Secondary | ICD-10-CM

## 2014-03-26 DIAGNOSIS — E785 Hyperlipidemia, unspecified: Secondary | ICD-10-CM

## 2014-03-26 NOTE — Patient Instructions (Addendum)
Your physician wants you to follow-up in: 6 months with Dr. Hilty. You will receive a reminder letter in the mail two months in advance. If you don't receive a letter, please call our office to schedule the follow-up appointment.    

## 2014-03-26 NOTE — Progress Notes (Signed)
OFFICE NOTE  Chief Complaint:  Routine followup  Primary Care Physician: Stephens Shire, MD  HPI:  Kristin Shaffer is an 80 year old Caucasian female with a history of hyperlipidemia and hypothyroidism. She was seen by myself on March 20, 2013, and at that time was found to be in hypertensive urgency with a blood pressure of 230/94. She was admitted to [facility] for blood pressure control and cardiac catheterization for unstable angina. She did in fact have a non-ST-elevation myocardial infarction with a mildly elevated troponin of 0.67. She underwent coronary angiography, which revealed a subtotal occlusion of the mid right coronary artery. She then underwent a successful complex PCI of the entire mid RCA encompassing a long tubular 40% to 60% lesion that preceded 2 tandem 95% and 99% subtotal occlusions. This was completed using 3 overlapping Xience Xpedition drug-eluting stents. She was placed on aspirin and Brilinta, and was discharged. She subsequently returned to [facility] on April 26 to the emergency room with chest pain. Patient states that it was a "sharp, stabbing, and burning" pain. It was 10 out of 10 in intensity. She took 4 baby aspirin at home, but did not have any nitroglycerin. EMS gave her one, which provided her some relief, and midway to the ER, they gave her another one, which essentially resolved the pain. Patient reports feeling weak, but that is improving compared to when she was discharged. She has had no chest pain since the episode on the 26th. She was provided with a prescription at the ER for sublingual nitroglycerin. She did undergo an echocardiogram which showed preserved LV systolic function and an EF of 60-65%. There was stage I diastolic dysfunction and aortic sclerosis with mild central aortic regurgitation. Otherwise no significant abnormalities.  She also had carotid and renal Dopplers. The carotid Dopplers indicated a small amount of bilateral plaque.  The renal Dopplers did not indicate any renal artery stenosis.  Recently she had lipid profile performed which showed a particle number of 1783, with LDL C. of 143.  Based on this I recommended increasing her Lipitor from 20-40 mg.  She did make this change, but as noted pain and weakness in her joints, especially when she wakes up in the morning which takes about 30 minutes to improve. This sounds a lot like arthritis, but she really feels it is related to her cholesterol medicine.  Overall, at her last office visit I felt she was doing fairly well. Unfortunately about 5 days after that visit she started to have acute onset chest discomfort with radiation of pain into her left arm. She presented to the emergency department and was found to have unstable angina. Cardiac catheterization was performed which demonstrated 99% in-stent stenosis at the previously placed mid RCA stent.  I am interested as to why she presented with acute unstable angina, and not progressive anginal symptoms with exertion. She certainly had absolutely no chest pain or arm pain just a few days prior to the presentation, which is more consistent with an acute coronary syndrome.  Since her last followup she has done very well. Recommended some changes in her medications including adding Zetia to get her LDL cholesterol down. She's also start walking, change her diet and managed to lose some weight. She seems to be doing much better.  PMHx:  Past Medical History  Diagnosis Date  . Hypertension   . GERD (gastroesophageal reflux disease)   . Hypothyroidism   . Skin cancer     "both  legs; right arm" (03/20/2013)  . High cholesterol   . Coronary artery disease   . Myocardial infarction     "Dr's saw evidence I might have had a heart attack" (03/20/2013)  . Pneumonia 2012  . Exertional shortness of breath   . Arthritis     "different places; not bad" (03/20/2013)  . Anxiety   . NSTEMI (non-ST elevated myocardial infarction)  03/20/2013    cath - mid RCA    Past Surgical History  Procedure Laterality Date  . Appendectomy  2003  . Dilation and curettage of uterus  1956?  Marland Kitchen Cataract extraction w/ intraocular lens  implant, bilateral Bilateral ~ 2008  . Breast biopsy Bilateral 1990s    "total of 5; 2 on one side, 3 on the other; all benign" (03/20/2013)  . Skin cancer excision      "1 off right arm; 2 off each leg" (03/20/2013)  . Transthoracic echocardiogram  03/2013    EF 15-72%, grade 1 diastolic dysfunction; mild MR; calcifed MV annulus; LA mildly dilated  . Coronary angioplasty with stent placement  03/20/2013    NSTEMI - subtotal occlusion of mid RCA - PCI of mid RCA of long tubular 40-60% lesion - 2 tandem 95-99% subtotal occlusions - 3 overlapping Xience Xpedition DES (Dr. Roni Bread)   . Coronary angioplasty  12/08/2013    FAMHx:  Family History  Problem Relation Age of Onset  . Dementia Mother   . Lung cancer Father   . Cancer Brother   . CAD Father 53    SOCHx:   reports that she has never smoked. She has never used smokeless tobacco. She reports that she does not drink alcohol or use illicit drugs.  ALLERGIES:  Allergies  Allergen Reactions  . Zocor [Simvastatin] Other (See Comments)    Leg weakness and cramping  . Latex Hives and Rash    ROS: A comprehensive review of systems was negative.  HOME MEDS: Current Outpatient Prescriptions  Medication Sig Dispense Refill  . aspirin 81 MG chewable tablet Chew 81 mg by mouth daily.      Marland Kitchen atorvastatin (LIPITOR) 40 MG tablet TAKE 1 TABLET (40 MG TOTAL) BY MOUTH DAILY.  90 tablet  2  . calcium gluconate 500 MG tablet Take 1 tablet by mouth daily.      Marland Kitchen ezetimibe (ZETIA) 10 MG tablet Take 1 tablet (10 mg total) by mouth daily.  90 tablet  2  . fenofibrate 54 MG tablet Take 1 tablet (54 mg total) by mouth daily.  30 tablet  5  . levothyroxine (SYNTHROID, LEVOTHROID) 88 MCG tablet Take 88 mcg by mouth daily before breakfast.      . lisinopril  (PRINIVIL,ZESTRIL) 10 MG tablet TAKE 1 TABLET BY MOUTH EVERY DAY  90 tablet  2  . Magnesium Oxide 250 MG TABS Take 1 tablet by mouth daily.      . metoprolol tartrate (LOPRESSOR) 25 MG tablet Take 0.5 tablets (12.5 mg total) by mouth 2 (two) times daily.  90 tablet  2  . nitroGLYCERIN (NITROSTAT) 0.4 MG SL tablet Place 1 tablet (0.4 mg total) under the tongue every 5 (five) minutes as needed for chest pain.  30 tablet  0  . omeprazole (PRILOSEC) 20 MG capsule Take 20 mg by mouth daily.      . potassium chloride SA (K-DUR,KLOR-CON) 20 MEQ tablet Take 1 tablet (20 mEq total) by mouth daily.  30 tablet  10  . prasugrel (EFFIENT) 10 MG TABS tablet  Take 1 tablet (10 mg total) by mouth daily.  14 tablet  0   No current facility-administered medications for this visit.    LABS/IMAGING: No results found for this or any previous visit (from the past 48 hour(s)). No results found.  VITALS: BP 144/72  Pulse 57  Ht 5\' 5"  (1.651 m)  Wt 175 lb 3.2 oz (79.47 kg)  BMI 29.15 kg/m2  EXAM: General appearance: alert and no distress Neck: no adenopathy, no carotid bruit, no JVD, supple, symmetrical, trachea midline and thyroid not enlarged, symmetric, no tenderness/mass/nodules Lungs: clear to auscultation bilaterally Heart: regular rate and rhythm, S1, S2 normal and diastolic murmur: early diastolic 2/6, crescendo at 2nd left intercostal space Abdomen: soft, non-tender; bowel sounds normal; no masses,  no organomegaly Extremities: extremities normal, atraumatic, no cyanosis or edema Pulses: 2+ and symmetric Skin: Skin color, texture, turgor normal. No rashes or lesions Neurologic: Grossly normal  EKG: Sinus bradycardia at 57  ASSESSMENT: 1. Coronary artery disease status post recent and STEMI, PCI to the RCA with 4 overlapping Xience drug-eluting stents - now with recent UA and ISR 2. Hypertension 3. Hypothyroidism 4. GERD 5. Dyspnea, possibly secondary to Brillinta 6. Dyslipidemia - now on  lipitor, zetia and fenofibrate  PLAN: 1.   Mrs. Florance is feeling much better and has started walking. She's managed to lose some weight and has a better energy level. Her cholesterol is markedly improved with her combination of Lipitor, Zetia and fenofibrate. LDL particle number is now below 1000 with LDL content of 72. HDL is, 275 with triglycerides in the 80s. Overall marked improvement in her lipid profile which should decrease her risk. Her shortness of breath has improved and may have been in part due to Proventil. She remains on aspirin and Effient and which she will need to be on long-term.    Plan to see her back in 6 months or sooner as necessary.  Pixie Casino, MD, [facility] Attending Cardiologist [facility] 03/26/2014, 2:52 PM

## 2014-04-27 ENCOUNTER — Telehealth: Payer: Self-pay | Admitting: Internal Medicine

## 2014-04-27 NOTE — Telephone Encounter (Signed)
Pt would like to know if there is something else she can take for Zetia and Effient. She can not afford these medicine.

## 2014-04-27 NOTE — Telephone Encounter (Signed)
Pt. Called stated he can't afford the effient or the zetia wants to switch to something different

## 2014-04-28 NOTE — Telephone Encounter (Signed)
Spoke with patient, she is already in the "donut hole" for2015.  Effient will cost $900/55months.  Zetia similar price.   Will mail patient assistance forms to her for Effient.  She isn't sure if she will qualify, but willing to try.   Reviewed need for proof of income.

## 2014-05-05 ENCOUNTER — Telehealth: Payer: Self-pay | Admitting: Pharmacist Clinician (PhC)/ Clinical Pharmacy Specialist

## 2014-05-05 NOTE — Telephone Encounter (Signed)
Patient states that you were going to mail paperwork to her so she could get free medication.  She has not received this yet.

## 2014-05-06 ENCOUNTER — Other Ambulatory Visit: Payer: Self-pay | Admitting: Pharmacist Clinician (PhC)/ Clinical Pharmacy Specialist

## 2014-05-06 MED ORDER — PRASUGREL HCL 10 MG PO TABS: 10.0000 mg | ORAL_TABLET | Freq: Every day | ORAL | Status: DC

## 2014-05-06 NOTE — Telephone Encounter (Signed)
Spoke with patient- still has not received Effient patient assistance info.  Advised that I will leave another copy at front desk, with samples.  Pt voiced understanding.

## 2014-06-18 ENCOUNTER — Other Ambulatory Visit: Payer: Self-pay

## 2014-06-18 MED ORDER — ATORVASTATIN CALCIUM 40 MG PO TABS: ORAL_TABLET | ORAL | Status: DC

## 2014-06-18 MED ORDER — PRASUGREL HCL 10 MG PO TABS: 10.0000 mg | ORAL_TABLET | Freq: Every day | ORAL | Status: DC

## 2014-06-18 MED ORDER — EZETIMIBE 10 MG PO TABS: 10.0000 mg | ORAL_TABLET | Freq: Every day | ORAL | Status: DC

## 2014-06-18 MED ORDER — METOPROLOL TARTRATE 25 MG PO TABS: 12.5000 mg | ORAL_TABLET | Freq: Two times a day (BID) | ORAL | Status: DC

## 2014-06-18 MED ORDER — LISINOPRIL 10 MG PO TABS: ORAL_TABLET | ORAL | Status: DC

## 2014-06-18 NOTE — Telephone Encounter (Signed)
Rx(s) was sent to pharmacy electronically.  

## 2014-06-18 NOTE — Addendum Note (Signed)
Addended by: Diana Eves on: 06/18/2014 11:32 AM   Modules accepted: Orders

## 2014-08-10 ENCOUNTER — Telehealth: Payer: Self-pay | Admitting: Internal Medicine

## 2014-08-10 MED ORDER — EZETIMIBE 10 MG PO TABS: 10.0000 mg | ORAL_TABLET | Freq: Every day | ORAL | Status: DC

## 2014-08-10 NOTE — Telephone Encounter (Signed)
Spoke with pt, we refilled her zetia in July and she reports she never got that order and is almost out. New refill sent into the pharm.

## 2014-08-10 NOTE — Telephone Encounter (Signed)
Pt wants to know if Dr Debara Pickett wants her to continue taking Zetia?Marland Kitchen She was wondering since this was not called in with her other medicine.If so please let her know and please call it in.

## 2014-09-24 ENCOUNTER — Ambulatory Visit (INDEPENDENT_AMBULATORY_CARE_PROVIDER_SITE_OTHER): Payer: Medicare Other | Admitting: Internal Medicine

## 2014-09-24 ENCOUNTER — Encounter: Payer: Self-pay | Admitting: Internal Medicine

## 2014-09-24 VITALS — BP 132/76 | HR 67 | Ht 65.0 in | Wt 181.5 lb

## 2014-09-24 DIAGNOSIS — K219 Gastro-esophageal reflux disease without esophagitis: Secondary | ICD-10-CM

## 2014-09-24 DIAGNOSIS — I214 Non-ST elevation (NSTEMI) myocardial infarction: Secondary | ICD-10-CM

## 2014-09-24 DIAGNOSIS — I200 Unstable angina: Secondary | ICD-10-CM

## 2014-09-24 DIAGNOSIS — I160 Hypertensive urgency: Secondary | ICD-10-CM

## 2014-09-24 DIAGNOSIS — E785 Hyperlipidemia, unspecified: Secondary | ICD-10-CM

## 2014-09-24 DIAGNOSIS — Z955 Presence of coronary angioplasty implant and graft: Secondary | ICD-10-CM

## 2014-09-24 DIAGNOSIS — I10 Essential (primary) hypertension: Secondary | ICD-10-CM

## 2014-09-24 MED ORDER — EZETIMIBE 10 MG PO TABS: 5.0000 mg | ORAL_TABLET | Freq: Every day | ORAL | Status: DC

## 2014-09-24 MED ORDER — CLOPIDOGREL BISULFATE 75 MG PO TABS: 75.0000 mg | ORAL_TABLET | Freq: Every day | ORAL | Status: DC

## 2014-09-24 NOTE — Progress Notes (Signed)
OFFICE NOTE  Chief Complaint:  Routine followup  Primary Care Physician: Stephens Shire, MD  HPI:  Kristin Shaffer is an 80 year old Caucasian female with a history of hyperlipidemia and hypothyroidism. She was seen by myself on March 20, 2013, and at that time was found to be in hypertensive urgency with a blood pressure of 230/94. She was admitted to [facility] for blood pressure control and cardiac catheterization for unstable angina. She did in fact have a non-ST-elevation myocardial infarction with a mildly elevated troponin of 0.67. She underwent coronary angiography, which revealed a subtotal occlusion of the mid right coronary artery. She then underwent a successful complex PCI of the entire mid RCA encompassing a long tubular 40% to 60% lesion that preceded 2 tandem 95% and 99% subtotal occlusions. This was completed using 3 overlapping Xience Xpedition drug-eluting stents. She was placed on aspirin and Brilinta, and was discharged. She subsequently returned to [facility] on April 26 to the emergency room with chest pain. Patient states that it was a "sharp, stabbing, and burning" pain. It was 10 out of 10 in intensity. She took 4 baby aspirin at home, but did not have any nitroglycerin. EMS gave her one, which provided her some relief, and midway to the ER, they gave her another one, which essentially resolved the pain. Patient reports feeling weak, but that is improving compared to when she was discharged. She has had no chest pain since the episode on the 26th. She was provided with a prescription at the ER for sublingual nitroglycerin. She did undergo an echocardiogram which showed preserved LV systolic function and an EF of 60-65%. There was stage I diastolic dysfunction and aortic sclerosis with mild central aortic regurgitation. Otherwise no significant abnormalities.  She also had carotid and renal Dopplers. The carotid Dopplers indicated a small amount of  bilateral plaque. The renal Dopplers did not indicate any renal artery stenosis.  Recently she had lipid profile performed which showed a particle number of 1783, with LDL C. of 143.  Based on this I recommended increasing her Lipitor from 20-40 mg.  She did make this change, but as noted pain and weakness in her joints, especially when she wakes up in the morning which takes about 30 minutes to improve. This sounds a lot like arthritis, but she really feels it is related to her cholesterol medicine.  Overall, at her last office visit I felt she was doing fairly well. Unfortunately about 5 days after that visit she started to have acute onset chest discomfort with radiation of pain into her left arm. She presented to the emergency department and was found to have unstable angina. Cardiac catheterization was performed which demonstrated 99% in-stent stenosis at the previously placed mid RCA stent.  I am interested as to why she presented with acute unstable angina, and not progressive anginal symptoms with exertion. She certainly had absolutely no chest pain or arm pain just a few days prior to the presentation, which is more consistent with an acute coronary syndrome.  Kristin Shaffer returns today for follow-up. She reports that she has had to take nitroglycerin twice for chest discomfort. Wants while riding in a car and another time at rest. Both episodes sound more like reflux however she did have improvement after about 15 minutes. She recently is been having problems with bruising on aspirin and Effient and is concerned about being on strong blood thinners. Her last stent placement was in 11/2013. Dual antiplatelet  therapy was recommended for at least a year.  PMHx:  Past Medical History  Diagnosis Date  . Hypertension   . GERD (gastroesophageal reflux disease)   . Hypothyroidism   . Skin cancer     "both legs; right arm" (03/20/2013)  . High cholesterol   . Coronary artery disease   . Myocardial  infarction     "Dr's saw evidence I might have had a heart attack" (03/20/2013)  . Pneumonia 2012  . Exertional shortness of breath   . Arthritis     "different places; not bad" (03/20/2013)  . Anxiety   . NSTEMI (non-ST elevated myocardial infarction) 03/20/2013    cath - mid RCA    Past Surgical History  Procedure Laterality Date  . Appendectomy  2003  . Dilation and curettage of uterus  1956?  Marland Kitchen Cataract extraction w/ intraocular lens  implant, bilateral Bilateral ~ 2008  . Breast biopsy Bilateral 1990s    "total of 5; 2 on one side, 3 on the other; all benign" (03/20/2013)  . Skin cancer excision      "1 off right arm; 2 off each leg" (03/20/2013)  . Transthoracic echocardiogram  03/2013    EF 31-51%, grade 1 diastolic dysfunction; mild MR; calcifed MV annulus; LA mildly dilated  . Coronary angioplasty with stent placement  03/20/2013    NSTEMI - subtotal occlusion of mid RCA - PCI of mid RCA of long tubular 40-60% lesion - 2 tandem 95-99% subtotal occlusions - 3 overlapping Xience Xpedition DES (Dr. Roni Bread)   . Coronary angioplasty  12/08/2013    FAMHx:  Family History  Problem Relation Age of Onset  . Dementia Mother   . Lung cancer Father   . Cancer Brother   . CAD Father 14    SOCHx:   reports that she has never smoked. She has never used smokeless tobacco. She reports that she does not drink alcohol or use illicit drugs.  ALLERGIES:  Allergies  Allergen Reactions  . Zocor [Simvastatin] Other (See Comments)    Leg weakness and cramping  . Latex Hives and Rash    ROS: A comprehensive review of systems was negative except for: Cardiovascular: positive for chest pain  HOME MEDS: Current Outpatient Prescriptions  Medication Sig Dispense Refill  . aspirin EC 81 MG tablet Take 81 mg by mouth daily.      Marland Kitchen atorvastatin (LIPITOR) 40 MG tablet TAKE 1 TABLET (40 MG TOTAL) BY MOUTH DAILY.  90 tablet  2  . calcium gluconate 500 MG tablet Take 1 tablet by mouth daily.       . cyclobenzaprine (FLEXERIL) 5 MG tablet Take 5 mg by mouth 2 (two) times daily as needed for muscle spasms.      Marland Kitchen ezetimibe (ZETIA) 10 MG tablet Take 0.5 tablets (5 mg total) by mouth daily.  28 tablet  0  . fenofibrate 54 MG tablet Take 1 tablet (54 mg total) by mouth daily.  30 tablet  5  . levothyroxine (SYNTHROID, LEVOTHROID) 100 MCG tablet Take 100 mcg by mouth daily before breakfast.      . lisinopril (PRINIVIL,ZESTRIL) 10 MG tablet TAKE 1 TABLET BY MOUTH EVERY DAY  90 tablet  2  . Magnesium Oxide 250 MG TABS Take 1 tablet by mouth daily.      . metoprolol tartrate (LOPRESSOR) 25 MG tablet Take 0.5 tablets (12.5 mg total) by mouth 2 (two) times daily.  90 tablet  2  . nitroGLYCERIN (NITROSTAT) 0.4 MG  SL tablet Place 1 tablet (0.4 mg total) under the tongue every 5 (five) minutes as needed for chest pain.  30 tablet  0  . omeprazole (PRILOSEC) 20 MG capsule Take 20 mg by mouth daily.      . potassium chloride SA (K-DUR,KLOR-CON) 20 MEQ tablet Take 1 tablet (20 mEq total) by mouth daily.  30 tablet  10  . clopidogrel (PLAVIX) 75 MG tablet Take 1 tablet (75 mg total) by mouth daily.  90 tablet  3   No current facility-administered medications for this visit.    LABS/IMAGING: No results found for this or any previous visit (from the past 48 hour(s)). No results found.  VITALS: BP 132/76  Pulse 67  Ht 5\' 5"  (1.651 m)  Wt 181 lb 8 oz (82.328 kg)  BMI 30.20 kg/m2  EXAM: General appearance: alert and no distress Neck: no adenopathy, no carotid bruit, no JVD, supple, symmetrical, trachea midline and thyroid not enlarged, symmetric, no tenderness/mass/nodules Lungs: clear to auscultation bilaterally Heart: regular rate and rhythm, S1, S2 normal and diastolic murmur: early diastolic 2/6, crescendo at 2nd left intercostal space Abdomen: soft, non-tender; bowel sounds normal; no masses,  no organomegaly Extremities: extremities normal, atraumatic, no cyanosis or edema Pulses: 2+ and  symmetric Skin: Skin color, texture, turgor normal. No rashes or lesions Neurologic: Grossly normal  EKG: Normal sinus rhythm at 67  ASSESSMENT: 1. Coronary artery disease status post recent and STEMI, PCI to the RCA with 4 overlapping Xience drug-eluting stents - now with recent UA and ISR 2. Hypertension 3. Hypothyroidism 4. GERD 5. Dyslipidemia - now on lipitor, zetia and fenofibrate  PLAN: 1.   Mrs. Amenta is doing well without clear anginal symptoms. She took nitroglycerin twice however I suspect this was for reflux. She is now close to one year out from her prior stents. She is complaining about significant bruising and has thin skin on her hands. I think it is reasonable to consider stopping Effient and switching her to Plavix. She should also continue aspirin. She is also in the doughnut hole on Saturday and we will provide her with samples today. As mentioned her cholesterol has been at goal with particle number less than 1000. This should significantly decrease her chance of further acute coronary syndrome events.  Plan to see her back in 6 months or sooner as necessary.  Pixie Casino, MD, [facility] Attending Cardiologist [facility] 09/24/2014, 3:06 PM

## 2014-09-24 NOTE — Patient Instructions (Signed)
Your physician has recommended you make the following change in your medication.Marland Kitchen STOP EFFIENT. START PLAVIX 75mg  ONCE DAILY  Your physician wants you to follow-up in: 6 months. You will receive a reminder letter in the mail two months in advance. If you don't receive a letter, please call our office to schedule the follow-up appointment.

## 2014-11-05 ENCOUNTER — Encounter (HOSPITAL_COMMUNITY): Payer: Self-pay | Admitting: Cardiovascular Disease

## 2015-03-23 ENCOUNTER — Ambulatory Visit (INDEPENDENT_AMBULATORY_CARE_PROVIDER_SITE_OTHER): Payer: Medicare Other | Admitting: Internal Medicine

## 2015-03-23 ENCOUNTER — Encounter: Payer: Self-pay | Admitting: Internal Medicine

## 2015-03-23 VITALS — BP 138/84 | HR 64 | Ht 65.0 in | Wt 182.8 lb

## 2015-03-23 DIAGNOSIS — Z955 Presence of coronary angioplasty implant and graft: Secondary | ICD-10-CM

## 2015-03-23 DIAGNOSIS — M791 Myalgia, unspecified site: Secondary | ICD-10-CM

## 2015-03-23 DIAGNOSIS — E785 Hyperlipidemia, unspecified: Secondary | ICD-10-CM

## 2015-03-23 DIAGNOSIS — K219 Gastro-esophageal reflux disease without esophagitis: Secondary | ICD-10-CM | POA: Diagnosis not present

## 2015-03-23 MED ORDER — FENOFIBRATE 54 MG PO TABS: 54.0000 mg | ORAL_TABLET | Freq: Every day | ORAL | Status: DC

## 2015-03-23 NOTE — Progress Notes (Signed)
OFFICE NOTE  Chief Complaint:  Weakness, fatigue  Primary Care Physician: Stephens Shire, MD  HPI:  Kristin Shaffer is an 81 year old Caucasian female with a history of hyperlipidemia and hypothyroidism. She was seen by myself on March 20, 2013, and at that time was found to be in hypertensive urgency with a blood pressure of 230/94. She was admitted to [facility] for blood pressure control and cardiac catheterization for unstable angina. She did in fact have a non-ST-elevation myocardial infarction with a mildly elevated troponin of 0.67. She underwent coronary angiography, which revealed a subtotal occlusion of the mid right coronary artery. She then underwent a successful complex PCI of the entire mid RCA encompassing a long tubular 40% to 60% lesion that preceded 2 tandem 95% and 99% subtotal occlusions. This was completed using 3 overlapping Xience Xpedition drug-eluting stents. She was placed on aspirin and Brilinta, and was discharged. She subsequently returned to [facility] on April 26 to the emergency room with chest pain. Patient states that it was a "sharp, stabbing, and burning" pain. It was 10 out of 10 in intensity. She took 4 baby aspirin at home, but did not have any nitroglycerin. EMS gave her one, which provided her some relief, and midway to the ER, they gave her another one, which essentially resolved the pain. Patient reports feeling weak, but that is improving compared to when she was discharged. She has had no chest pain since the episode on the 26th. She was provided with a prescription at the ER for sublingual nitroglycerin. She did undergo an echocardiogram which showed preserved LV systolic function and an EF of 60-65%. There was stage I diastolic dysfunction and aortic sclerosis with mild central aortic regurgitation. Otherwise no significant abnormalities.  She also had carotid and renal Dopplers. The carotid Dopplers indicated a small amount of  bilateral plaque. The renal Dopplers did not indicate any renal artery stenosis.  Recently she had lipid profile performed which showed a particle number of 1783, with LDL C. of 143.  Based on this I recommended increasing her Lipitor from 20-40 mg.  She did make this change, but as noted pain and weakness in her joints, especially when she wakes up in the morning which takes about 30 minutes to improve. This sounds a lot like arthritis, but she really feels it is related to her cholesterol medicine.  Overall, at her last office visit I felt she was doing fairly well. Unfortunately about 5 days after that visit she started to have acute onset chest discomfort with radiation of pain into her left arm. She presented to the emergency department and was found to have unstable angina. Cardiac catheterization was performed which demonstrated 99% in-stent stenosis at the previously placed mid RCA stent.  I am interested as to why she presented with acute unstable angina, and not progressive anginal symptoms with exertion. She certainly had absolutely no chest pain or arm pain just a few days prior to the presentation, which is more consistent with an acute coronary syndrome.  Kristin Shaffer returns today for follow-up. She reports that she has had to take nitroglycerin twice for chest discomfort. Wants while riding in a car and another time at rest. Both episodes sound more like reflux however she did have improvement after about 15 minutes. She recently is been having problems with bruising on aspirin and Effient and is concerned about being on strong blood thinners. Her last stent placement was in 11/2013. Dual antiplatelet  therapy was recommended for at least a year.  I saw Kristin Shaffer back in the office today. She is complaining of some weakness and fatigue. She also soreness in her muscles, particularly when she gets up in the morning. Some of it seems to be joint soreness which is concerning for osteoarthritis. She  clearly has signs of osteoarthritis in her DIP joints and there is some crepitus in her knees. It is however possible that some of her symptoms could be related to Lipitor. She is on combination Lipitor and fenofibrate, which is slightly higher risk of developing myalgias. Unfortunately, her cholesterol has been really well controlled.  PMHx:  Past Medical History  Diagnosis Date  . Hypertension   . GERD (gastroesophageal reflux disease)   . Hypothyroidism   . Skin cancer     "both legs; right arm" (03/20/2013)  . High cholesterol   . Coronary artery disease   . Myocardial infarction     "Dr's saw evidence I might have had a heart attack" (03/20/2013)  . Pneumonia 2012  . Exertional shortness of breath   . Arthritis     "different places; not bad" (03/20/2013)  . Anxiety   . NSTEMI (non-ST elevated myocardial infarction) 03/20/2013    cath - mid RCA    Past Surgical History  Procedure Laterality Date  . Appendectomy  2003  . Dilation and curettage of uterus  1956?  Marland Kitchen Cataract extraction w/ intraocular lens  implant, bilateral Bilateral ~ 2008  . Breast biopsy Bilateral 1990s    "total of 5; 2 on one side, 3 on the other; all benign" (03/20/2013)  . Skin cancer excision      "1 off right arm; 2 off each leg" (03/20/2013)  . Transthoracic echocardiogram  03/2013    EF 34-74%, grade 1 diastolic dysfunction; mild MR; calcifed MV annulus; LA mildly dilated  . Coronary angioplasty with stent placement  03/20/2013    NSTEMI - subtotal occlusion of mid RCA - PCI of mid RCA of long tubular 40-60% lesion - 2 tandem 95-99% subtotal occlusions - 3 overlapping Xience Xpedition DES (Dr. Roni Bread)   . Coronary angioplasty  12/08/2013  . Left heart catheterization with coronary angiogram N/A 03/20/2013    Procedure: LEFT HEART CATHETERIZATION WITH CORONARY ANGIOGRAM;  Surgeon: Sanda Klein, MD;  Location: [facility] CATH LAB;  Service: Cardiovascular;  Laterality: N/A;  . Left heart catheterization with  coronary angiogram N/A 12/08/2013    Procedure: LEFT HEART CATHETERIZATION WITH CORONARY ANGIOGRAM;  Surgeon: Blane Ohara, MD;  Location: [facility] CATH LAB;  Service: Cardiovascular;  Laterality: N/A;    FAMHx:  Family History  Problem Relation Age of Onset  . Dementia Mother   . Lung cancer Father   . Cancer Brother   . CAD Father 44    SOCHx:   reports that she has never smoked. She has never used smokeless tobacco. She reports that she does not drink alcohol or use illicit drugs.  ALLERGIES:  Allergies  Allergen Reactions  . Zocor [Simvastatin] Other (See Comments)    Leg weakness and cramping  . Latex Hives and Rash    ROS: A comprehensive review of systems was negative except for: Musculoskeletal: positive for myalgias  HOME MEDS: Current Outpatient Prescriptions  Medication Sig Dispense Refill  . aspirin EC 81 MG tablet Take 81 mg by mouth daily.    Marland Kitchen atorvastatin (LIPITOR) 40 MG tablet TAKE 1 TABLET (40 MG TOTAL) BY MOUTH DAILY. 90 tablet 2  .  calcium gluconate 500 MG tablet Take 1 tablet by mouth daily.    . clopidogrel (PLAVIX) 75 MG tablet Take 1 tablet (75 mg total) by mouth daily. 90 tablet 3  . cyclobenzaprine (FLEXERIL) 5 MG tablet Take 5 mg by mouth 2 (two) times daily as needed for muscle spasms.    Marland Kitchen ezetimibe (ZETIA) 10 MG tablet Take 0.5 tablets (5 mg total) by mouth daily. 28 tablet 0  . fenofibrate 54 MG tablet Take 1 tablet (54 mg total) by mouth daily. 90 tablet 1  . levothyroxine (SYNTHROID, LEVOTHROID) 100 MCG tablet Take 100 mcg by mouth daily before breakfast.    . lisinopril (PRINIVIL,ZESTRIL) 10 MG tablet TAKE 1 TABLET BY MOUTH EVERY DAY 90 tablet 2  . Magnesium Oxide 250 MG TABS Take 1 tablet by mouth daily.    . metoprolol tartrate (LOPRESSOR) 25 MG tablet Take 0.5 tablets (12.5 mg total) by mouth 2 (two) times daily. 90 tablet 2  . nitroGLYCERIN (NITROSTAT) 0.4 MG SL tablet Place 1 tablet (0.4 mg total) under the tongue every 5 (five) minutes as  needed for chest pain. 30 tablet 0  . omeprazole (PRILOSEC) 20 MG capsule Take 20 mg by mouth 2 (two) times daily.     . potassium chloride SA (K-DUR,KLOR-CON) 20 MEQ tablet Take 1 tablet (20 mEq total) by mouth daily. 30 tablet 10   No current facility-administered medications for this visit.    LABS/IMAGING: No results found for this or any previous visit (from the past 48 hour(s)). No results found.  VITALS: BP 138/84 mmHg  Pulse 64  Ht 5\' 5"  (1.651 m)  Wt 182 lb 12.8 oz (82.918 kg)  BMI 30.42 kg/m2  EXAM: General appearance: alert and no distress Neck: no adenopathy, no carotid bruit, no JVD, supple, symmetrical, trachea midline and thyroid not enlarged, symmetric, no tenderness/mass/nodules Lungs: clear to auscultation bilaterally Heart: regular rate and rhythm, S1, S2 normal and diastolic murmur: early diastolic 2/6, crescendo at 2nd left intercostal space Abdomen: soft, non-tender; bowel sounds normal; no masses,  no organomegaly Extremities: extremities normal, atraumatic, no cyanosis or edema Pulses: 2+ and symmetric Skin: Skin color, texture, turgor normal. No rashes or lesions Neurologic: Grossly normal  EKG: Normal sinus rhythm at 64  ASSESSMENT: 1. Coronary artery disease status post recent and STEMI, PCI to the RCA with 4 overlapping Xience drug-eluting stents - now with recent UA and ISR 2. Hypertension 3. Hypothyroidism 4. GERD 5. Dyslipidemia - now on lipitor, zetia and fenofibrate 6. Myalgias  PLAN: 1.   Mrs. Lillibridge is doing well without clear anginal symptoms. She is having some burning chest discomfort which may be reflux. Her Prilosec dose is been doubled. She may need to see GI for endoscopy. I don't think this is recurrent angina at this point. She is reporting some myalgias and muscle stiffness in the morning. Some of this may be osteoarthritis however cannot rule out a myopathy. She seemed to do better on lower dose Lipitor. It could be that in  combination with fenofibrate, which there is a known increase of myalgias, that she's having symptoms related to that. I recommend she stop her Lipitor now for at least a couple weeks to see if her symptoms get better. Ultimately she may be candidate for an alternative medication such as a PCSK9 inhibitor if she is on max tolerated therapy.  Plan to see her back in 6 months. She is to contact her office with any change in her symptoms off of  the Lipitor.  Pixie Casino, MD, [facility] Attending Cardiologist [facility] 03/23/2015, 3:13 PM

## 2015-03-23 NOTE — Patient Instructions (Signed)
Dr. Debara Pickett said you can HOLD atorvastatin for 2 weeks  Please call us and let us know how you are doing   Your physician wants you to follow-up in: 6 months with Dr. Debara Pickett. You will receive a reminder letter in the mail two months in advance. If you don't receive a letter, please call our office to schedule the follow-up appointment.

## 2015-04-06 ENCOUNTER — Telehealth: Payer: Self-pay | Admitting: Internal Medicine

## 2015-04-06 DIAGNOSIS — E785 Hyperlipidemia, unspecified: Secondary | ICD-10-CM

## 2015-04-06 NOTE — Telephone Encounter (Signed)
Have her stay on Zetia - She is a good candidate for PSCK9 inhibitor if she is interested. Will start prior auth process.  Dr. Lemmie Evens

## 2015-04-06 NOTE — Telephone Encounter (Signed)
Patient has not had labs in >1 year  Order NMR?

## 2015-04-06 NOTE — Telephone Encounter (Signed)
Patient notified to have fasting labs in 2 weeks. Lab ordered and slip mailed to patient.

## 2015-04-06 NOTE — Telephone Encounter (Signed)
Yes .. We can repeat in 2 more weeks to see what her numbers look like off of lipitor.  Dr. Lemmie Evens

## 2015-04-06 NOTE — Telephone Encounter (Signed)
Spoke with patient. She states she is feeling "amazing" after being off her statin. She has no complaints. She states she tolerated atorvastatin 10mg  daily fine in the past. She has not had labs in >1 year. She would like advice on what to do next - and prefers to have generic medication is changes are made.   Will defer to Dr. Debara Pickett to review and advise

## 2015-04-06 NOTE — Telephone Encounter (Signed)
Pt have been off her Cholesterol medicine for 2 weeks. She was suppose to call this week and see what she needs to do or take now.

## 2015-04-29 LAB — NMR LIPOPROFILE WITH LIPIDS
Cholesterol, Total: 249 mg/dL — ABNORMAL HIGH (ref 100–199)
HDL PARTICLE NUMBER: 37.9 umol/L (ref >=30.5)
HDL Size: 8.7 nm — ABNORMAL LOW (ref >=9.2)
HDL-C: 64 mg/dL (ref >39)
LARGE VLDL-P: 5.8 nmol/L — AB (ref <=2.7)
LDL CALC: 156 mg/dL — AB (ref 0–99)
LDL PARTICLE NUMBER: 1946 nmol/L — AB (ref <1000)
LDL Size: 21.3 nm (ref >=20.8)
LP-IR SCORE: 72 — AB (ref <=45)
Large HDL-P: 3.8 umol/L — ABNORMAL LOW (ref >=4.8)
Small LDL Particle Number: 691 nmol/L — ABNORMAL HIGH (ref <=527)
Triglycerides: 147 mg/dL (ref 0–149)
VLDL SIZE: 57.8 nm — AB (ref <=46.6)

## 2015-04-30 ENCOUNTER — Telehealth: Payer: Self-pay | Admitting: Internal Medicine

## 2015-04-30 DIAGNOSIS — Z79899 Other long term (current) drug therapy: Secondary | ICD-10-CM

## 2015-04-30 MED ORDER — ATORVASTATIN CALCIUM 20 MG PO TABS: ORAL_TABLET | ORAL | Status: DC

## 2015-04-30 NOTE — Telephone Encounter (Signed)
Pt is calling in stating that she had some labs done on 5/31 and would like to know if her results were available as of yet. Please call  Thanks

## 2015-04-30 NOTE — Telephone Encounter (Signed)
Orders entered, instructions given to patient, who verbalized understanding. Med refilled for 20mg  dosing and sent to her preferred [place] pharmacy.

## 2015-04-30 NOTE — Telephone Encounter (Signed)
If she wishes to try lipitor 20 mg daily again, that is fine.  Please order CMET and CK to be performed 1 week after starting.   Thanks.  Dr. Lemmie Evens

## 2015-04-30 NOTE — Telephone Encounter (Signed)
Called patient, reported recent labs - elevated LDL, total cholesterol, particles.  She has been on a break from Lipitor since last OV (~1 month ago). She has continued her fenofibrate. Notes muscle pain much better, still has some pain.  She notes she had done well on Lipitor at 20mg  dose, seemed to have issues when dose was increased to 40mg . Wanted to know if resuming Lipitor at 20mg  OK.  Informed patient I would route to Dr. Debara Pickett for recommendation - she voiced understanding.

## 2015-05-11 LAB — COMPREHENSIVE METABOLIC PANEL
ALK PHOS: 74 U/L (ref 39–117)
ALT: 17 U/L (ref 0–35)
AST: 44 U/L — ABNORMAL HIGH (ref 0–37)
Albumin: 4.2 g/dL (ref 3.5–5.2)
BILIRUBIN TOTAL: 0.6 mg/dL (ref 0.2–1.2)
BUN: 17 mg/dL (ref 6–23)
CO2: 27 mEq/L (ref 19–32)
Calcium: 9.8 mg/dL (ref 8.4–10.5)
Chloride: 108 mEq/L (ref 96–112)
Creat: 0.95 mg/dL (ref 0.50–1.10)
Glucose, Bld: 99 mg/dL (ref 70–99)
Potassium: 5.2 mEq/L (ref 3.5–5.3)
Sodium: 142 mEq/L (ref 135–145)
TOTAL PROTEIN: 6.7 g/dL (ref 6.0–8.3)

## 2015-05-11 LAB — CK: Total CK: 61 U/L (ref 7–177)

## 2015-05-28 ENCOUNTER — Other Ambulatory Visit: Payer: Self-pay | Admitting: Internal Medicine

## 2015-05-28 NOTE — Telephone Encounter (Signed)
Rx(s) sent to pharmacy electronically.  

## 2015-07-28 ENCOUNTER — Telehealth: Payer: Self-pay | Admitting: Internal Medicine

## 2015-07-28 MED ORDER — EZETIMIBE 10 MG PO TABS: 10.0000 mg | ORAL_TABLET | Freq: Every day | ORAL | Status: DC

## 2015-07-28 NOTE — Telephone Encounter (Signed)
Mrs.Maclay is calling in reference to her medication Zetia.. Was told it was going to be generic in the late summer and she wants to check to see if it can be refilled as generic . Please Call  Thanks

## 2015-07-28 NOTE — Telephone Encounter (Signed)
Spoke with Kristin Shaffer - zetia is to be generic in October. Patient aware. She wishes to have a 30 day supply of zetia sent to [facility]

## 2015-08-05 ENCOUNTER — Other Ambulatory Visit: Payer: Self-pay | Admitting: Internal Medicine

## 2015-09-15 ENCOUNTER — Encounter: Payer: Self-pay | Admitting: Internal Medicine

## 2015-09-15 ENCOUNTER — Ambulatory Visit (INDEPENDENT_AMBULATORY_CARE_PROVIDER_SITE_OTHER): Payer: Medicare Other | Admitting: Internal Medicine

## 2015-09-15 VITALS — BP 142/80 | HR 63 | Ht 65.0 in | Wt 180.4 lb

## 2015-09-15 DIAGNOSIS — I214 Non-ST elevation (NSTEMI) myocardial infarction: Secondary | ICD-10-CM

## 2015-09-15 DIAGNOSIS — I160 Hypertensive urgency: Secondary | ICD-10-CM

## 2015-09-15 DIAGNOSIS — M62830 Muscle spasm of back: Secondary | ICD-10-CM | POA: Insufficient documentation

## 2015-09-15 DIAGNOSIS — E785 Hyperlipidemia, unspecified: Secondary | ICD-10-CM | POA: Diagnosis not present

## 2015-09-15 DIAGNOSIS — Z955 Presence of coronary angioplasty implant and graft: Secondary | ICD-10-CM | POA: Diagnosis not present

## 2015-09-15 MED ORDER — CYCLOBENZAPRINE HCL 5 MG PO TABS: 5.0000 mg | ORAL_TABLET | Freq: Two times a day (BID) | ORAL | Status: DC | PRN

## 2015-09-15 MED ORDER — NITROGLYCERIN 0.4 MG SL SUBL: 0.4000 mg | SUBLINGUAL_TABLET | SUBLINGUAL | Status: DC | PRN

## 2015-09-15 NOTE — Progress Notes (Signed)
OFFICE NOTE  Chief Complaint:  Back spasm  Primary Care Physician: Stephens Shire, MD  HPI:  Kristin Shaffer is an 81 year old Caucasian female with a history of hyperlipidemia and hypothyroidism. She was seen by myself on March 20, 2013, and at that time was found to be in hypertensive urgency with a blood pressure of 230/94. She was admitted to [facility] for blood pressure control and cardiac catheterization for unstable angina. She did in fact have a non-ST-elevation myocardial infarction with a mildly elevated troponin of 0.67. She underwent coronary angiography, which revealed a subtotal occlusion of the mid right coronary artery. She then underwent a successful complex PCI of the entire mid RCA encompassing a long tubular 40% to 60% lesion that preceded 2 tandem 95% and 99% subtotal occlusions. This was completed using 3 overlapping Xience Xpedition drug-eluting stents. She was placed on aspirin and Brilinta, and was discharged. She subsequently returned to Kristin Shaffer on April 26 to the emergency room with chest pain. Patient states that it was a "sharp, stabbing, and burning" pain. It was 10 out of 10 in intensity. She took 4 baby aspirin at home, but did not have any nitroglycerin. EMS gave her one, which provided her some relief, and midway to the ER, they gave her another one, which essentially resolved the pain. Patient reports feeling weak, but that is improving compared to when she was discharged. She has had no chest pain since the episode on the 26th. She was provided with a prescription at the ER for sublingual nitroglycerin. She did undergo an echocardiogram which showed preserved LV systolic function and an EF of 60-65%. There was stage I diastolic dysfunction and aortic sclerosis with mild central aortic regurgitation. Otherwise no significant abnormalities.  She also had carotid and renal Dopplers. The carotid Dopplers indicated a small amount of bilateral  plaque. The renal Dopplers did not indicate any renal artery stenosis.  Recently she had lipid profile performed which showed a particle number of 1783, with LDL C. of 143.  Based on this I recommended increasing her Lipitor from 20-40 mg.  She did make this change, but as noted pain and weakness in her joints, especially when she wakes up in the morning which takes about 30 minutes to improve. This sounds a lot like arthritis, but she really feels it is related to her cholesterol medicine.  Overall, at her last office visit I felt she was doing fairly well. Unfortunately about 5 days after that visit she started to have acute onset chest discomfort with radiation of pain into her left arm. She presented to the emergency department and was found to have unstable angina. Cardiac catheterization was performed which demonstrated 99% in-stent stenosis at the previously placed mid RCA stent.  I am interested as to why she presented with acute unstable angina, and not progressive anginal symptoms with exertion. She certainly had absolutely no chest pain or arm pain just a few days prior to the presentation, which is more consistent with an acute coronary syndrome.  Kristin Shaffer returns today for follow-up. She reports that she has had to take nitroglycerin twice for chest discomfort. Wants while riding in a car and another time at rest. Both episodes sound more like reflux however she did have improvement after about 15 minutes. She recently is been having problems with bruising on aspirin and Effient and is concerned about being on strong blood thinners. Her last stent placement was in 11/2013. Dual antiplatelet  therapy was recommended for at least a year.  I saw Kristin Shaffer back in the office today. She is complaining of some weakness and fatigue. She also soreness in her muscles, particularly when she gets up in the morning. Some of it seems to be joint soreness which is concerning for osteoarthritis. She clearly  has signs of osteoarthritis in her DIP joints and there is some crepitus in her knees. It is however possible that some of her symptoms could be related to Lipitor. She is on combination Lipitor and fenofibrate, which is slightly higher risk of developing myalgias. Unfortunately, her cholesterol has been really well controlled.   Kristin Shaffer returns today for follow-up. She recently was planting some irises in her lawn and is complaining of some back pain and spasm. In the past she had some relief from Flexeril, however does not currently have any. She denies any cardiac chest pain. She does get some occasional reflux symptoms associated with certain foods.  PMHx:  Past Medical History  Diagnosis Date  . Hypertension   . GERD (gastroesophageal reflux disease)   . Hypothyroidism   . Skin cancer     "both legs; right arm" (03/20/2013)  . High cholesterol   . Coronary artery disease   . Myocardial infarction [facility])     "Dr's saw evidence I might have had a heart attack" (03/20/2013)  . Pneumonia 2012  . Exertional shortness of breath   . Arthritis     "different places; not bad" (03/20/2013)  . Anxiety   . NSTEMI (non-ST elevated myocardial infarction) ([place]) 03/20/2013    cath - mid RCA    Past Surgical History  Procedure Laterality Date  . Appendectomy  2003  . Dilation and curettage of uterus  1956?  Marland Kitchen Cataract extraction w/ intraocular lens  implant, bilateral Bilateral ~ 2008  . Breast biopsy Bilateral 1990s    "total of 5; 2 on one side, 3 on the other; all benign" (03/20/2013)  . Skin cancer excision      "1 off right arm; 2 off each leg" (03/20/2013)  . Transthoracic echocardiogram  03/2013    EF 12-45%, grade 1 diastolic dysfunction; mild MR; calcifed MV annulus; LA mildly dilated  . Coronary angioplasty with stent placement  03/20/2013    NSTEMI - subtotal occlusion of mid RCA - PCI of mid RCA of long tubular 40-60% lesion - 2 tandem 95-99% subtotal occlusions - 3 overlapping Xience  Xpedition DES (Dr. Roni Bread)   . Coronary angioplasty  12/08/2013  . Left heart catheterization with coronary angiogram N/A 03/20/2013    Procedure: LEFT HEART CATHETERIZATION WITH CORONARY ANGIOGRAM;  Surgeon: Sanda Klein, MD;  Location: [place] CATH LAB;  Service: Cardiovascular;  Laterality: N/A;  . Left heart catheterization with coronary angiogram N/A 12/08/2013    Procedure: LEFT HEART CATHETERIZATION WITH CORONARY ANGIOGRAM;  Surgeon: Blane Ohara, MD;  Location: [facility] CATH LAB;  Service: Cardiovascular;  Laterality: N/A;    FAMHx:  Family History  Problem Relation Age of Onset  . Dementia Mother   . Lung cancer Father   . Cancer Brother   . CAD Father 75    SOCHx:   reports that she has never smoked. She has never used smokeless tobacco. She reports that she does not drink alcohol or use illicit drugs.  ALLERGIES:  Allergies  Allergen Reactions  . Atorvastatin Other (See Comments)    Leg weakness and cramps  . Zocor [Simvastatin] Other (See Comments)    Leg  weakness and cramping  . Latex Hives and Rash    ROS: A comprehensive review of systems was negative.  HOME MEDS: Current Outpatient Prescriptions  Medication Sig Dispense Refill  . aspirin EC 81 MG tablet Take 81 mg by mouth daily.    Marland Kitchen atorvastatin (LIPITOR) 20 MG tablet Take 20 mg by mouth daily.    . calcium gluconate 500 MG tablet Take 1 tablet by mouth daily.    . clopidogrel (PLAVIX) 75 MG tablet Take 1 tablet (75 mg total) by mouth daily. 90 tablet 3  . cyclobenzaprine (FLEXERIL) 5 MG tablet Take 1 tablet (5 mg total) by mouth 2 (two) times daily as needed for muscle spasms. 20 tablet 0  . ezetimibe (ZETIA) 10 MG tablet Take 1 tablet (10 mg total) by mouth daily. 30 tablet 0  . fenofibrate 54 MG tablet TAKE 1 TABLET EVERY DAY 90 tablet 0  . levothyroxine (SYNTHROID, LEVOTHROID) 100 MCG tablet Take 100 mcg by mouth daily before breakfast.    . lisinopril (PRINIVIL,ZESTRIL) 10 MG tablet TAKE 1 TABLET BY  MOUTH EVERY DAY 90 tablet 2  . Magnesium Oxide 250 MG TABS Take 1 tablet by mouth daily.    . metoprolol tartrate (LOPRESSOR) 25 MG tablet TAKE 1/2 TABLET TWICE DAILY 90 tablet 2  . omeprazole (PRILOSEC) 20 MG capsule Take 20 mg by mouth 2 (two) times daily.     . potassium chloride SA (K-DUR,KLOR-CON) 20 MEQ tablet Take 1 tablet (20 mEq total) by mouth daily. 30 tablet 10  . nitroGLYCERIN (NITROSTAT) 0.4 MG SL tablet Place 1 tablet (0.4 mg total) under the tongue every 5 (five) minutes as needed for chest pain. 30 tablet 2   No current facility-administered medications for this visit.    LABS/IMAGING: No results found for this or any previous visit (from the past 48 hour(s)). No results found.  VITALS: BP 142/80 mmHg  Pulse 63  Ht 5\' 5"  (1.651 m)  Wt 180 lb 6.4 oz (81.829 kg)  BMI 30.02 kg/m2  EXAM: General appearance: alert and no distress Neck: no adenopathy, no carotid bruit, no JVD, supple, symmetrical, trachea midline and thyroid not enlarged, symmetric, no tenderness/mass/nodules Lungs: clear to auscultation bilaterally Heart: regular rate and rhythm, S1, S2 normal and diastolic murmur: early diastolic 2/6, crescendo at 2nd left intercostal space Abdomen: soft, non-tender; bowel sounds normal; no masses,  no organomegaly Extremities: extremities normal, atraumatic, no cyanosis or edema and Mild tender to palpation left lateral to the lumbar spine Pulses: 2+ and symmetric Skin: Skin color, texture, turgor normal. No rashes or lesions Neurologic: Grossly normal  EKG: Normal sinus rhythm at 63  ASSESSMENT: 1. Coronary artery disease status post recent and STEMI, PCI to the RCA with 4 overlapping Xience drug-eluting stents - now with recent UA and ISR 2. Hypertension 3. Hypothyroidism 4. GERD 5. Dyslipidemia - now on lipitor, zetia and fenofibrate 6.  low back spasm  PLAN: 1.   Kristin Shaffer is doing well without clear anginal symptoms.  She occasionally gets reflux  symptoms but no significant anginal symptoms. She's now complaining of some low back spasms and pain from working in her garden the other day. She's asking for a short refill of her Flexeril which she says been helpful in the past. I'll go ahead and  Provide that for her today. I've also advised her to use hot compresses and work on some back stretching exercises. She is scheduled to follow-up with her primary care provider every 6 months.  Pixie Casino, MD, [facility] Attending Cardiologist [place] 09/15/2015, 2:37 PM

## 2015-09-15 NOTE — Patient Instructions (Signed)
Your nitroglycerin was refilled.   Dr. Debara Pickett prescribed flexeril to use as needed. Please request future refills from your PCP.   Your physician wants you to follow-up in: 6 months with Dr. Debara Pickett. You will receive a reminder letter in the mail two months in advance. If you don't receive a letter, please call our office to schedule the follow-up appointment.  If you need a refill on your cardiac medications before your next appointment, please call your pharmacy.

## 2015-10-04 ENCOUNTER — Other Ambulatory Visit: Payer: Self-pay | Admitting: Internal Medicine

## 2015-10-12 ENCOUNTER — Other Ambulatory Visit: Payer: Self-pay | Admitting: Internal Medicine

## 2015-10-13 NOTE — Telephone Encounter (Signed)
Rx has been sent to the pharmacy electronically. ° °

## 2015-10-30 ENCOUNTER — Other Ambulatory Visit: Payer: Self-pay | Admitting: Internal Medicine

## 2015-11-01 NOTE — Telephone Encounter (Signed)
REFILL 

## 2015-11-11 ENCOUNTER — Other Ambulatory Visit: Payer: Self-pay | Admitting: Internal Medicine

## 2015-11-12 NOTE — Telephone Encounter (Signed)
Rx(s) sent to pharmacy electronically.  

## 2016-03-15 ENCOUNTER — Ambulatory Visit (INDEPENDENT_AMBULATORY_CARE_PROVIDER_SITE_OTHER): Payer: Medicare Other | Admitting: Internal Medicine

## 2016-03-15 ENCOUNTER — Encounter: Payer: Self-pay | Admitting: Internal Medicine

## 2016-03-15 VITALS — BP 160/64 | HR 54 | Ht 65.0 in | Wt 178.4 lb

## 2016-03-15 DIAGNOSIS — Z955 Presence of coronary angioplasty implant and graft: Secondary | ICD-10-CM

## 2016-03-15 DIAGNOSIS — E785 Hyperlipidemia, unspecified: Secondary | ICD-10-CM

## 2016-03-15 DIAGNOSIS — R079 Chest pain, unspecified: Secondary | ICD-10-CM | POA: Diagnosis not present

## 2016-03-15 DIAGNOSIS — I214 Non-ST elevation (NSTEMI) myocardial infarction: Secondary | ICD-10-CM

## 2016-03-15 DIAGNOSIS — R0602 Shortness of breath: Secondary | ICD-10-CM | POA: Diagnosis not present

## 2016-03-15 NOTE — Patient Instructions (Signed)
Your physician has requested that you have an echocardiogram @ [address] - [address]. Echocardiography is a painless test that uses sound waves to create images of your heart. It provides your doctor with information about the size and shape of your heart and how well your heart's chambers and valves are working. This procedure takes approximately one hour. There are no restrictions for this procedure.  Your physician recommends that you schedule a follow-up appointment in: ONE MONTH with Dr. Hilty  

## 2016-03-15 NOTE — Progress Notes (Signed)
OFFICE NOTE  Chief Complaint:  Atypical chest pain, dyspnea  Primary Care Physician: Stephens Shire, MD  HPI:  Kristin Shaffer is an 82 year old Caucasian female with a history of hyperlipidemia and hypothyroidism. She was seen by myself on March 20, 2013, and at that time was found to be in hypertensive urgency with a blood pressure of 230/94. She was admitted to [facility] for blood pressure control and cardiac catheterization for unstable angina. She did in fact have a non-ST-elevation myocardial infarction with a mildly elevated troponin of 0.67. She underwent coronary angiography, which revealed a subtotal occlusion of the mid right coronary artery. She then underwent a successful complex PCI of the entire mid RCA encompassing a long tubular 40% to 60% lesion that preceded 2 tandem 95% and 99% subtotal occlusions. This was completed using 3 overlapping Xience Xpedition drug-eluting stents. She was placed on aspirin and Brilinta, and was discharged. She subsequently returned to [facility] on April 26 to the emergency room with chest pain. Patient states that it was a "sharp, stabbing, and burning" pain. It was 10 out of 10 in intensity. She took 4 baby aspirin at home, but did not have any nitroglycerin. EMS gave her one, which provided her some relief, and midway to the ER, they gave her another one, which essentially resolved the pain. Patient reports feeling weak, but that is improving compared to when she was discharged. She has had no chest pain since the episode on the 26th. She was provided with a prescription at the ER for sublingual nitroglycerin. She did undergo an echocardiogram which showed preserved LV systolic function and an EF of 60-65%. There was stage I diastolic dysfunction and aortic sclerosis with mild central aortic regurgitation. Otherwise no significant abnormalities.  She also had carotid and renal Dopplers. The carotid Dopplers indicated a small  amount of bilateral plaque. The renal Dopplers did not indicate any renal artery stenosis.  Recently she had lipid profile performed which showed a particle number of 1783, with LDL C. of 143.  Based on this I recommended increasing her Lipitor from 20-40 mg.  She did make this change, but as noted pain and weakness in her joints, especially when she wakes up in the morning which takes about 30 minutes to improve. This sounds a lot like arthritis, but she really feels it is related to her cholesterol medicine.  Overall, at her last office visit I felt she was doing fairly well. Unfortunately about 5 days after that visit she started to have acute onset chest discomfort with radiation of pain into her left arm. She presented to the emergency department and was found to have unstable angina. Cardiac catheterization was performed which demonstrated 99% in-stent stenosis at the previously placed mid RCA stent.  I am interested as to why she presented with acute unstable angina, and not progressive anginal symptoms with exertion. She certainly had absolutely no chest pain or arm pain just a few days prior to the presentation, which is more consistent with an acute coronary syndrome.  Kristin Shaffer returns today for follow-up. She reports that she has had to take nitroglycerin twice for chest discomfort. Wants while riding in a car and another time at rest. Both episodes sound more like reflux however she did have improvement after about 15 minutes. She recently is been having problems with bruising on aspirin and Effient and is concerned about being on strong blood thinners. Her last stent placement was in 11/2013.  Dual antiplatelet therapy was recommended for at least a year.  I saw Kristin Shaffer back in the office today. She is complaining of some weakness and fatigue. She also soreness in her muscles, particularly when she gets up in the morning. Some of it seems to be joint soreness which is concerning for  osteoarthritis. She clearly has signs of osteoarthritis in her DIP joints and there is some crepitus in her knees. It is however possible that some of her symptoms could be related to Lipitor. She is on combination Lipitor and fenofibrate, which is slightly higher risk of developing myalgias. Unfortunately, her cholesterol has been really well controlled.   Kristin Shaffer returns today for follow-up. She recently was planting some irises in her lawn and is complaining of some back pain and spasm. In the past she had some relief from Flexeril, however does not currently have any. She denies any cardiac chest pain. She does get some occasional reflux symptoms associated with certain foods.  Kristin Shaffer see him back in the office again today. She has some intermittent chest discomfort. The symptoms seem atypical and are not like the chest and left arm pain she had in the past. She recently saw her primary care provider who noted some possible fluid in the right lung base. I do hear some faint crackles today however it sounds to me more like atelectasis or even fibrosis. She reports some shortness of breath. She also thinks she may be having reflux symptoms but is hesitant to take omeprazole because she says that she feels that could cause her chest pain. I reassured her that it would not.   PMHx:  Past Medical History  Diagnosis Date  . Hypertension   . GERD (gastroesophageal reflux disease)   . Hypothyroidism   . Skin cancer     "both legs; right arm" (03/20/2013)  . High cholesterol   . Coronary artery disease   . Myocardial infarction [facility])     "Dr's saw evidence I might have had a heart attack" (03/20/2013)  . Pneumonia 2012  . Exertional shortness of breath   . Arthritis     "different places; not bad" (03/20/2013)  . Anxiety   . NSTEMI (non-ST elevated myocardial infarction) ([place]) 03/20/2013    cath - mid RCA    Past Surgical History  Procedure Laterality Date  . Appendectomy  2003  .  Dilation and curettage of uterus  1956?  Marland Kitchen Cataract extraction w/ intraocular lens  implant, bilateral Bilateral ~ 2008  . Breast biopsy Bilateral 1990s    "total of 5; 2 on one side, 3 on the other; all benign" (03/20/2013)  . Skin cancer excision      "1 off right arm; 2 off each leg" (03/20/2013)  . Transthoracic echocardiogram  03/2013    EF 123456, grade 1 diastolic dysfunction; mild MR; calcifed MV annulus; LA mildly dilated  . Coronary angioplasty with stent placement  03/20/2013    NSTEMI - subtotal occlusion of mid RCA - PCI of mid RCA of long tubular 40-60% lesion - 2 tandem 95-99% subtotal occlusions - 3 overlapping Xience Xpedition DES (Dr. Roni Bread)   . Coronary angioplasty  12/08/2013  . Left heart catheterization with coronary angiogram N/A 03/20/2013    Procedure: LEFT HEART CATHETERIZATION WITH CORONARY ANGIOGRAM;  Surgeon: Sanda Klein, MD;  Location: [facility] CATH LAB;  Service: Cardiovascular;  Laterality: N/A;  . Left heart catheterization with coronary angiogram N/A 12/08/2013    Procedure: LEFT HEART CATHETERIZATION WITH  CORONARY ANGIOGRAM;  Surgeon: Blane Ohara, MD;  Location: [facility] CATH LAB;  Service: Cardiovascular;  Laterality: N/A;    FAMHx:  Family History  Problem Relation Age of Onset  . Dementia Mother   . Lung cancer Father   . Cancer Brother   . CAD Father 37    SOCHx:   reports that she has never smoked. She has never used smokeless tobacco. She reports that she does not drink alcohol or use illicit drugs.  ALLERGIES:  Allergies  Allergen Reactions  . Atorvastatin Other (See Comments)    Leg weakness and cramps  . Zocor [Simvastatin] Other (See Comments)    Leg weakness and cramping  . Latex Hives and Rash    ROS: A comprehensive review of systems was negative.  HOME MEDS: Current Outpatient Prescriptions  Medication Sig Dispense Refill  . aspirin EC 81 MG tablet Take 81 mg by mouth daily.    Marland Kitchen atorvastatin (LIPITOR) 20 MG tablet TAKE 1 TABLET  EVERY DAY 90 tablet 2  . calcium gluconate 500 MG tablet Take 1 tablet by mouth daily.    . clopidogrel (PLAVIX) 75 MG tablet TAKE 1 TABLET (75 MG TOTAL) BY MOUTH DAILY. 90 tablet 2  . cyclobenzaprine (FLEXERIL) 5 MG tablet Take 1 tablet (5 mg total) by mouth 2 (two) times daily as needed for muscle spasms. 20 tablet 0  . ezetimibe (ZETIA) 10 MG tablet Take 1 tablet (10 mg total) by mouth daily. 30 tablet 0  . fenofibrate 54 MG tablet Take 1 tablet (54 mg total) by mouth daily. 90 tablet 2  . levothyroxine (SYNTHROID, LEVOTHROID) 100 MCG tablet Take 100 mcg by mouth daily before breakfast.    . lisinopril (PRINIVIL,ZESTRIL) 10 MG tablet TAKE 1 TABLET BY MOUTH EVERY DAY 90 tablet 2  . Magnesium Oxide 250 MG TABS Take 1 tablet by mouth daily.    . metoprolol tartrate (LOPRESSOR) 25 MG tablet TAKE 1/2 TABLET TWICE DAILY 90 tablet 2  . nitroGLYCERIN (NITROSTAT) 0.4 MG SL tablet Place 1 tablet (0.4 mg total) under the tongue every 5 (five) minutes as needed for chest pain. 30 tablet 2  . omeprazole (PRILOSEC) 20 MG capsule Take 20 mg by mouth 2 (two) times daily.     . potassium chloride SA (K-DUR,KLOR-CON) 20 MEQ tablet Take 1 tablet (20 mEq total) by mouth daily. 30 tablet 10  . ZETIA 10 MG tablet TAKE 1 TABLET EVERY DAY 30 tablet 9   No current facility-administered medications for this visit.    LABS/IMAGING: No results found for this or any previous visit (from the past 48 hour(s)). No results found.  VITALS: BP 160/64 mmHg  Pulse 54  Ht 5\' 5"  (1.651 m)  Wt 178 lb 6 oz (80.91 kg)  BMI 29.68 kg/m2  EXAM: General appearance: alert and no distress Neck: no adenopathy, no carotid bruit, no JVD, supple, symmetrical, trachea midline and thyroid not enlarged, symmetric, no tenderness/mass/nodules Lungs: clear to auscultation bilaterally Heart: regular rate and rhythm, S1, S2 normal and diastolic murmur: early diastolic 2/6, crescendo at 2nd left intercostal space Abdomen: soft, non-tender;  bowel sounds normal; no masses,  no organomegaly Extremities: extremities normal, atraumatic, no cyanosis or edema and Mild tender to palpation left lateral to the lumbar spine Pulses: 2+ and symmetric Skin: Skin color, texture, turgor normal. No rashes or lesions Neurologic: Grossly normal  EKG: Sinus bradycardia 54   ASSESSMENT: 1. Coronary artery disease status post recent and STEMI, PCI to the RCA  with 4 overlapping Xience drug-eluting stents - now with recent UA and ISR 2. Hypertension 3. Hypothyroidism 4. GERD 5. Dyslipidemia - now on lipitor, zetia and fenofibrate 6. Low back spasm 7. DOE  PLAN: 1.   Kristin Shaffer has some recurrent discomfort in the chest which is sharp and unlike her pain she experienced previously. I do not think it's anginal however she has had some worsening shortness of breath. There are faint crackles in the right base which I don't believe are related to pulmonary edema. Nonetheless, we'll obtain an echocardiogram to further assess LV function and filling pressures. She had recent lab work her primary care provider which shows good control of cholesterol and other laboratory parameters are generally within normal limits.  Pixie Casino, MD, [facility] Attending Cardiologist [place] [facility] Edell Mesenbrink 03/15/2016, 6:38 PM

## 2016-03-16 ENCOUNTER — Other Ambulatory Visit: Payer: Self-pay | Admitting: Internal Medicine

## 2016-03-17 NOTE — Telephone Encounter (Signed)
REFILL 

## 2016-03-30 ENCOUNTER — Ambulatory Visit (HOSPITAL_COMMUNITY): Payer: Medicare Other | Attending: Cardiology

## 2016-03-30 ENCOUNTER — Other Ambulatory Visit: Payer: Self-pay

## 2016-03-30 DIAGNOSIS — I119 Hypertensive heart disease without heart failure: Secondary | ICD-10-CM | POA: Diagnosis not present

## 2016-03-30 DIAGNOSIS — R079 Chest pain, unspecified: Secondary | ICD-10-CM | POA: Insufficient documentation

## 2016-03-30 DIAGNOSIS — E785 Hyperlipidemia, unspecified: Secondary | ICD-10-CM | POA: Diagnosis not present

## 2016-03-30 DIAGNOSIS — I059 Rheumatic mitral valve disease, unspecified: Secondary | ICD-10-CM | POA: Insufficient documentation

## 2016-03-30 DIAGNOSIS — I351 Nonrheumatic aortic (valve) insufficiency: Secondary | ICD-10-CM | POA: Insufficient documentation

## 2016-03-30 DIAGNOSIS — R0602 Shortness of breath: Secondary | ICD-10-CM | POA: Diagnosis not present

## 2016-04-04 ENCOUNTER — Telehealth: Payer: Self-pay | Admitting: Internal Medicine

## 2016-04-04 NOTE — Telephone Encounter (Signed)
Returned call to patient. Notified her of echo results. Reminded her of OV 04/14/16.

## 2016-04-04 NOTE — Telephone Encounter (Signed)
Pt had echo 03-30-16 and would like the results

## 2016-04-14 ENCOUNTER — Encounter: Payer: Self-pay | Admitting: Internal Medicine

## 2016-04-14 ENCOUNTER — Ambulatory Visit (INDEPENDENT_AMBULATORY_CARE_PROVIDER_SITE_OTHER): Payer: Medicare Other | Admitting: Internal Medicine

## 2016-04-14 VITALS — BP 162/69 | HR 59 | Ht 65.0 in | Wt 185.0 lb

## 2016-04-14 DIAGNOSIS — I272 Other secondary pulmonary hypertension: Secondary | ICD-10-CM

## 2016-04-14 DIAGNOSIS — R10A1 Flank pain, right side: Secondary | ICD-10-CM | POA: Insufficient documentation

## 2016-04-14 DIAGNOSIS — N390 Urinary tract infection, site not specified: Secondary | ICD-10-CM | POA: Diagnosis not present

## 2016-04-14 DIAGNOSIS — R109 Unspecified abdominal pain: Secondary | ICD-10-CM | POA: Insufficient documentation

## 2016-04-14 DIAGNOSIS — Q251 Coarctation of aorta: Secondary | ICD-10-CM

## 2016-04-14 DIAGNOSIS — Q253 Supravalvular aortic stenosis: Secondary | ICD-10-CM

## 2016-04-14 DIAGNOSIS — J8410 Pulmonary fibrosis, unspecified: Secondary | ICD-10-CM | POA: Diagnosis not present

## 2016-04-14 DIAGNOSIS — R0609 Other forms of dyspnea: Secondary | ICD-10-CM

## 2016-04-14 DIAGNOSIS — I160 Hypertensive urgency: Secondary | ICD-10-CM

## 2016-04-14 DIAGNOSIS — R0602 Shortness of breath: Secondary | ICD-10-CM | POA: Diagnosis not present

## 2016-04-14 LAB — COMPLETE METABOLIC PANEL WITH GFR
ALT: 18 U/L (ref 6–29)
AST: 45 U/L — ABNORMAL HIGH (ref 10–35)
Albumin: 4.3 g/dL (ref 3.6–5.1)
Alkaline Phosphatase: 65 U/L (ref 33–130)
BUN: 21 mg/dL (ref 7–25)
CO2: 23 mmol/L (ref 20–31)
Calcium: 9.5 mg/dL (ref 8.6–10.4)
Chloride: 105 mmol/L (ref 98–110)
Creat: 1.03 mg/dL — ABNORMAL HIGH (ref 0.60–0.88)
GFR, EST NON AFRICAN AMERICAN: 50 mL/min — AB (ref >=60)
GFR, Est African American: 58 mL/min — ABNORMAL LOW (ref >=60)
GLUCOSE: 78 mg/dL (ref 65–99)
POTASSIUM: 4.6 mmol/L (ref 3.5–5.3)
SODIUM: 141 mmol/L (ref 135–146)
TOTAL PROTEIN: 6.7 g/dL (ref 6.1–8.1)
Total Bilirubin: 0.6 mg/dL (ref 0.2–1.2)

## 2016-04-14 NOTE — Progress Notes (Signed)
OFFICE NOTE  Chief Complaint:  "I don't feel well", "my right side hurts"  Primary Care Physician: Stephens Shire, MD  HPI:  Kristin Shaffer is an 82 year old Caucasian female with a history of hyperlipidemia and hypothyroidism. She was seen by myself on March 20, 2013, and at that time was found to be in hypertensive urgency with a blood pressure of 230/94. She was admitted to [facility] for blood pressure control and cardiac catheterization for unstable angina. She did in fact have a non-ST-elevation myocardial infarction with a mildly elevated troponin of 0.67. She underwent coronary angiography, which revealed a subtotal occlusion of the mid right coronary artery. She then underwent a successful complex PCI of the entire mid RCA encompassing a long tubular 40% to 60% lesion that preceded 2 tandem 95% and 99% subtotal occlusions. This was completed using 3 overlapping Xience Xpedition drug-eluting stents. She was placed on aspirin and Brilinta, and was discharged. She subsequently returned to [facility] on April 26 to the emergency room with chest pain. Patient states that it was a "sharp, stabbing, and burning" pain. It was 10 out of 10 in intensity. She took 4 baby aspirin at home, but did not have any nitroglycerin. EMS gave her one, which provided her some relief, and midway to the ER, they gave her another one, which essentially resolved the pain. Patient reports feeling weak, but that is improving compared to when she was discharged. She has had no chest pain since the episode on the 26th. She was provided with a prescription at the ER for sublingual nitroglycerin. She did undergo an echocardiogram which showed preserved LV systolic function and an EF of 60-65%. There was stage I diastolic dysfunction and aortic sclerosis with mild central aortic regurgitation. Otherwise no significant abnormalities.  She also had carotid and renal Dopplers. The carotid Dopplers  indicated a small amount of bilateral plaque. The renal Dopplers did not indicate any renal artery stenosis.  Recently she had lipid profile performed which showed a particle number of 1783, with LDL C. of 143.  Based on this I recommended increasing her Lipitor from 20-40 mg.  She did make this change, but as noted pain and weakness in her joints, especially when she wakes up in the morning which takes about 30 minutes to improve. This sounds a lot like arthritis, but she really feels it is related to her cholesterol medicine.  Overall, at her last office visit I felt she was doing fairly well. Unfortunately about 5 days after that visit she started to have acute onset chest discomfort with radiation of pain into her left arm. She presented to the emergency department and was found to have unstable angina. Cardiac catheterization was performed which demonstrated 99% in-stent stenosis at the previously placed mid RCA stent.  I am interested as to why she presented with acute unstable angina, and not progressive anginal symptoms with exertion. She certainly had absolutely no chest pain or arm pain just a few days prior to the presentation, which is more consistent with an acute coronary syndrome.  Kristin Shaffer returns today for follow-up. She reports that she has had to take nitroglycerin twice for chest discomfort. Wants while riding in a car and another time at rest. Both episodes sound more like reflux however she did have improvement after about 15 minutes. She recently is been having problems with bruising on aspirin and Effient and is concerned about being on strong blood thinners. Her last stent  placement was in 11/2013. Dual antiplatelet therapy was recommended for at least a year.  I saw Kristin Shaffer back in the office today. She is complaining of some weakness and fatigue. She also soreness in her muscles, particularly when she gets up in the morning. Some of it seems to be joint soreness which is  concerning for osteoarthritis. She clearly has signs of osteoarthritis in her DIP joints and there is some crepitus in her knees. It is however possible that some of her symptoms could be related to Lipitor. She is on combination Lipitor and fenofibrate, which is slightly higher risk of developing myalgias. Unfortunately, her cholesterol has been really well controlled.   Kristin Shaffer returns today for follow-up. She recently was planting some irises in her lawn and is complaining of some back pain and spasm. In the past she had some relief from Flexeril, however does not currently have any. She denies any cardiac chest pain. She does get some occasional reflux symptoms associated with certain foods.  Kristin Shaffer see him back in the office again today. She has some intermittent chest discomfort. The symptoms seem atypical and are not like the chest and left arm pain she had in the past. She recently saw her primary care provider who noted some possible fluid in the right lung base. I do hear some faint crackles today however it sounds to me more like atelectasis or even fibrosis. She reports some shortness of breath. She also thinks she may be having reflux symptoms but is hesitant to take omeprazole because she says that she feels that could cause her chest pain. I reassured her that it would not.  04/14/2016  Kristin Shaffer was seen back today in follow-up for review of her echocardiogram. This is essentially unchanged compared to her prior study in 2014. Her pulmonary pressure is elevated at 45 mmHg. LVEF is 60-65%. There is mild diastolic dysfunction. Although her primary pressure is elevated, this is not likely a significant cause of her shortness of breath given the mild elevation. I am concerned however that she may have underlying pulmonary fibrosis. There is evidence of this by chest x-ray in 2015 and she has some dry crackles on exam. I do not believe there is interstitial edema. She is also complaining  now of right flank pain. She has some CVA tenderness but has had no fever, chills or shaking chills. She recently had 2 UTIs apparently which eventually improved with Cipro. She denies any chest pain.    PMHx:  Past Medical History  Diagnosis Date  . Hypertension   . GERD (gastroesophageal reflux disease)   . Hypothyroidism   . Skin cancer     "both legs; right arm" (03/20/2013)  . High cholesterol   . Coronary artery disease   . Myocardial infarction [facility])     "Dr's saw evidence I might have had a heart attack" (03/20/2013)  . Pneumonia 2012  . Exertional shortness of breath   . Arthritis     "different places; not bad" (03/20/2013)  . Anxiety   . NSTEMI (non-ST elevated myocardial infarction) ([place]) 03/20/2013    cath - mid RCA    Past Surgical History  Procedure Laterality Date  . Appendectomy  2003  . Dilation and curettage of uterus  1956?  Marland Kitchen Cataract extraction w/ intraocular lens  implant, bilateral Bilateral ~ 2008  . Breast biopsy Bilateral 1990s    "total of 5; 2 on one side, 3 on the other; all benign" (03/20/2013)  .  Skin cancer excision      "1 off right arm; 2 off each leg" (03/20/2013)  . Transthoracic echocardiogram  03/2013    EF 123456, grade 1 diastolic dysfunction; mild MR; calcifed MV annulus; LA mildly dilated  . Coronary angioplasty with stent placement  03/20/2013    NSTEMI - subtotal occlusion of mid RCA - PCI of mid RCA of long tubular 40-60% lesion - 2 tandem 95-99% subtotal occlusions - 3 overlapping Xience Xpedition DES (Dr. Roni Bread)   . Coronary angioplasty  12/08/2013  . Left heart catheterization with coronary angiogram N/A 03/20/2013    Procedure: LEFT HEART CATHETERIZATION WITH CORONARY ANGIOGRAM;  Surgeon: Sanda Klein, MD;  Location: [facility] CATH LAB;  Service: Cardiovascular;  Laterality: N/A;  . Left heart catheterization with coronary angiogram N/A 12/08/2013    Procedure: LEFT HEART CATHETERIZATION WITH CORONARY ANGIOGRAM;  Surgeon: Blane Ohara, MD;  Location: [facility] CATH LAB;  Service: Cardiovascular;  Laterality: N/A;    FAMHx:  Family History  Problem Relation Age of Onset  . Dementia Mother   . Lung cancer Father   . Cancer Brother   . CAD Father 74    SOCHx:   reports that she has never smoked. She has never used smokeless tobacco. She reports that she does not drink alcohol or use illicit drugs.  ALLERGIES:  Allergies  Allergen Reactions  . Atorvastatin Other (See Comments)    Leg weakness and cramps  . Zocor [Simvastatin] Other (See Comments)    Leg weakness and cramping  . Latex Hives and Rash    ROS: Pertinent items noted in HPI and remainder of comprehensive ROS otherwise negative.  HOME MEDS: Current Outpatient Prescriptions  Medication Sig Dispense Refill  . aspirin EC 81 MG tablet Take 81 mg by mouth daily.    Marland Kitchen atorvastatin (LIPITOR) 20 MG tablet TAKE 1 TABLET EVERY DAY 90 tablet 2  . calcium gluconate 500 MG tablet Take 1 tablet by mouth daily.    . clopidogrel (PLAVIX) 75 MG tablet TAKE 1 TABLET (75 MG TOTAL) BY MOUTH DAILY. 90 tablet 2  . cyclobenzaprine (FLEXERIL) 5 MG tablet Take 1 tablet (5 mg total) by mouth 2 (two) times daily as needed for muscle spasms. 20 tablet 0  . ezetimibe (ZETIA) 10 MG tablet Take 5 mg by mouth daily.    . fenofibrate 54 MG tablet Take 1 tablet (54 mg total) by mouth daily. 90 tablet 2  . levothyroxine (SYNTHROID, LEVOTHROID) 100 MCG tablet Take 100 mcg by mouth daily before breakfast.    . lisinopril (PRINIVIL,ZESTRIL) 10 MG tablet TAKE 1 TABLET BY MOUTH EVERY DAY 90 tablet 2  . Magnesium Oxide 250 MG TABS Take 1 tablet by mouth daily.    . metoprolol tartrate (LOPRESSOR) 25 MG tablet TAKE 1/2 TABLET TWICE DAILY 90 tablet 0  . nitroGLYCERIN (NITROSTAT) 0.4 MG SL tablet Place 1 tablet (0.4 mg total) under the tongue every 5 (five) minutes as needed for chest pain. 30 tablet 2  . omeprazole (PRILOSEC) 20 MG capsule Take 20 mg by mouth 2 (two) times daily.     .  potassium chloride SA (K-DUR,KLOR-CON) 20 MEQ tablet Take 1 tablet (20 mEq total) by mouth daily. 30 tablet 10   No current facility-administered medications for this visit.    LABS/IMAGING: No results found for this or any previous visit (from the past 48 hour(s)). No results found.  VITALS: BP 162/69 mmHg  Pulse 59  Ht 5\' 5"  (1.651 m)  Wt 185 lb (83.915 kg)  BMI 30.79 kg/m2  EXAM: General appearance: alert, mild distress and mildly obese Neck: no carotid bruit and no JVD Lungs: rales RLL Heart: regular rate and rhythm, S1, S2 normal and diastolic murmur: early diastolic 2/6, crescendo at 2nd left intercostal space Abdomen: soft, non-tender; bowel sounds normal; no masses,  no organomegaly Extremities: extremities normal, atraumatic, no cyanosis or edema and Mild tender to palpation right flank Pulses: 2+ and symmetric Skin: Skin color, texture, turgor normal. No rashes or lesions Neurologic: Grossly normal  EKG: Deferred  ASSESSMENT: 1. Coronary artery disease status post recent and STEMI, PCI to the RCA with 4 overlapping Xience drug-eluting stents - now with recent UA and ISR 2. Hypertension 3. Hypothyroidism 4. GERD 5. Dyslipidemia - now on lipitor, zetia and fenofibrate 6. Low back spasm 7. Progressive DOE 8. Pulmonary hypertension  9. Right flank pain  PLAN: 1.   Mrs. Hadorn is now describing right flank pain. She has had progressive dyspnea on exertion and mild pulmonary hypertension. She does not have any underlying COPD that I'm aware of. There were signs of some interstitial markings on her last chest x-ray in her may be underlying interstitial lung disease. I like to get a CT scan of the chest to evaluate her pain which is below the last rib on the right side as well as check laboratory work including a metabolic profile and BNP to evaluate her volume status. I do not think she needs Lasix at this time. We will also get a urinalysis to make sure there is no  evidence of recurrent UTI. If there is evidence of pulmonary fibrosis then she may need PFTs and pulmonary referral, however did not think this will cause her right flank or lower chest pain.  Pixie Casino, MD, [facility] Attending Cardiologist [place] 04/14/2016, 1:22 PM

## 2016-04-14 NOTE — Patient Instructions (Signed)
Medication Instructions:  Continue current medication  Labwork: CMP. BNP, UA  Testing/Procedures: Non-Cardiac CT scanning, (CAT scanning), is a noninvasive, special x-ray that produces cross-sectional images of the body using x-rays and a computer. CT scans help physicians diagnose and treat medical conditions. For some CT exams, a contrast material is used to enhance visibility in the area of the body being studied. CT scans provide greater clarity and reveal more details than regular x-ray exams.  Your physician has requested that you have an abdominal aorta duplex. During this test, an ultrasound is used to evaluate the aorta. Allow 30 minutes for this exam. Do not eat after midnight the day before and avoid carbonated beverages   Follow-Up: 1 Month  Any Other Special Instructions Will Be Listed Below (If Applicable).   If you need a refill on your cardiac medications before your next appointment, please call your pharmacy.

## 2016-04-15 LAB — URINALYSIS
Bilirubin Urine: NEGATIVE
GLUCOSE, UA: NEGATIVE
Hgb urine dipstick: NEGATIVE
Ketones, ur: NEGATIVE
Nitrite: NEGATIVE
PH: 5.5 (ref 5.0–8.0)
Protein, ur: NEGATIVE
SPECIFIC GRAVITY, URINE: 1.014 (ref 1.001–1.035)

## 2016-04-15 LAB — BRAIN NATRIURETIC PEPTIDE: BRAIN NATRIURETIC PEPTIDE: 40 pg/mL (ref <100)

## 2016-04-21 ENCOUNTER — Ambulatory Visit (INDEPENDENT_AMBULATORY_CARE_PROVIDER_SITE_OTHER)
Admission: RE | Admit: 2016-04-21 | Discharge: 2016-04-21 | Disposition: A | Discharge status: Home / Self Care | Payer: Medicare Other | Source: Ambulatory Visit | Attending: Internal Medicine | Admitting: Internal Medicine

## 2016-04-21 DIAGNOSIS — R109 Unspecified abdominal pain: Secondary | ICD-10-CM | POA: Diagnosis not present

## 2016-04-21 DIAGNOSIS — R0602 Shortness of breath: Secondary | ICD-10-CM

## 2016-04-21 DIAGNOSIS — J8410 Pulmonary fibrosis, unspecified: Secondary | ICD-10-CM

## 2016-04-25 ENCOUNTER — Ambulatory Visit (HOSPITAL_COMMUNITY)
Admission: RE | Admit: 2016-04-25 | Discharge: 2016-04-25 | Disposition: A | Discharge status: Home / Self Care | Payer: Medicare Other | Source: Ambulatory Visit | Attending: Cardiology | Admitting: Cardiology

## 2016-04-25 DIAGNOSIS — I700 Atherosclerosis of aorta: Secondary | ICD-10-CM | POA: Insufficient documentation

## 2016-04-25 DIAGNOSIS — I2510 Atherosclerotic heart disease of native coronary artery without angina pectoris: Secondary | ICD-10-CM | POA: Diagnosis not present

## 2016-04-25 DIAGNOSIS — K219 Gastro-esophageal reflux disease without esophagitis: Secondary | ICD-10-CM | POA: Diagnosis not present

## 2016-04-25 DIAGNOSIS — E7800 Pure hypercholesterolemia, unspecified: Secondary | ICD-10-CM | POA: Insufficient documentation

## 2016-04-25 DIAGNOSIS — I10 Essential (primary) hypertension: Secondary | ICD-10-CM | POA: Diagnosis not present

## 2016-04-25 DIAGNOSIS — I708 Atherosclerosis of other arteries: Secondary | ICD-10-CM | POA: Insufficient documentation

## 2016-04-25 DIAGNOSIS — Q251 Coarctation of aorta: Secondary | ICD-10-CM

## 2016-04-25 DIAGNOSIS — Q253 Supravalvular aortic stenosis: Secondary | ICD-10-CM | POA: Insufficient documentation

## 2016-04-25 DIAGNOSIS — F419 Anxiety disorder, unspecified: Secondary | ICD-10-CM | POA: Diagnosis not present

## 2016-04-26 ENCOUNTER — Other Ambulatory Visit: Payer: Self-pay | Admitting: *Deleted

## 2016-04-26 DIAGNOSIS — R0600 Dyspnea, unspecified: Secondary | ICD-10-CM

## 2016-04-26 DIAGNOSIS — R9389 Abnormal findings on diagnostic imaging of other specified body structures: Secondary | ICD-10-CM

## 2016-04-26 DIAGNOSIS — I7409 Other arterial embolism and thrombosis of abdominal aorta: Secondary | ICD-10-CM

## 2016-04-26 DIAGNOSIS — I779 Disorder of arteries and arterioles, unspecified: Secondary | ICD-10-CM

## 2016-05-15 ENCOUNTER — Telehealth: Payer: Self-pay | Admitting: Internal Medicine

## 2016-05-15 NOTE — Telephone Encounter (Signed)
Returned call and spoke w/ patient. She is asking that if she doesn't see pulmonology until July, if she still needs to f/u w Dr. Debara Pickett next week. I advised yes, to keep this appt for f/u discussion of recently performed tests.  Pt voiced understanding and acknowledged appt info.

## 2016-05-15 NOTE — Telephone Encounter (Signed)
NEw Message  Pt call requesting to speak wit RN. Pt wants to know if she should keep her appt with Dr. Debara Pickett on 6/26. Pt states she sees her pulmonologist on 7/21 and did not know if she should wait to see Dr. Debara Pickett. Please call back to discuss

## 2016-05-22 ENCOUNTER — Ambulatory Visit (INDEPENDENT_AMBULATORY_CARE_PROVIDER_SITE_OTHER): Payer: Medicare Other | Admitting: Internal Medicine

## 2016-05-22 ENCOUNTER — Encounter: Payer: Self-pay | Admitting: Internal Medicine

## 2016-05-22 VITALS — BP 130/78 | HR 61 | Ht 65.0 in | Wt 177.8 lb

## 2016-05-22 DIAGNOSIS — R109 Unspecified abdominal pain: Secondary | ICD-10-CM

## 2016-05-22 DIAGNOSIS — I272 Other secondary pulmonary hypertension: Secondary | ICD-10-CM

## 2016-05-22 DIAGNOSIS — J8410 Pulmonary fibrosis, unspecified: Secondary | ICD-10-CM

## 2016-05-22 DIAGNOSIS — R0609 Other forms of dyspnea: Secondary | ICD-10-CM | POA: Diagnosis not present

## 2016-05-22 NOTE — Progress Notes (Addendum)
OFFICE NOTE  Chief Complaint:  "I don't feel well", "my right side hurts"  Primary Care Physician: Kristin Shire, MD  HPI:  Kristin Shaffer is an 82 year old Caucasian female with a history of hyperlipidemia and hypothyroidism. She was seen by myself on March 20, 2013, and at that time was found to be in hypertensive urgency with a blood pressure of 230/94. She was admitted to [facility] for blood pressure control and cardiac catheterization for unstable angina. She did in fact have a non-ST-elevation myocardial infarction with a mildly elevated troponin of 0.67. She underwent coronary angiography, which revealed a subtotal occlusion of the mid right coronary artery. She then underwent a successful complex PCI of the entire mid RCA encompassing a long tubular 40% to 60% lesion that preceded 2 tandem 95% and 99% subtotal occlusions. This was completed using 3 overlapping Xience Xpedition drug-eluting stents. She was placed on aspirin and Brilinta, and was discharged. She subsequently returned to [facility]in Shaffer on April 26 to the emergency room with chest pain. Patient states that it was a "sharp, stabbing, and burning" pain. It was 10 out of 10 in intensity. She took 4 baby aspirin at home, but did not have any nitroglycerin. EMS gave her one, which provided her some relief, and midway to the ER, they gave her another one, which essentially resolved the pain. Patient reports feeling weak, but that is improving compared to when she was discharged. She has had no chest pain since the episode on the 26th. She was provided with a prescription at the ER for sublingual nitroglycerin. She did undergo an echocardiogram which showed preserved LV systolic function and an EF of 60-65%. There was stage I diastolic dysfunction and aortic sclerosis with mild central aortic regurgitation. Otherwise no significant abnormalities.  She also had carotid and renal Dopplers. The carotid Dopplers  indicated a small amount of bilateral plaque. The renal Dopplers did not indicate any renal artery stenosis.  Recently she had lipid profile performed which showed a particle number of 1783, with LDL C. of 143.  Based on this I recommended increasing her Lipitor from 20-40 mg.  She did make this change, but as noted pain and weakness in her joints, especially when she wakes up in the morning which takes about 30 minutes to improve. This sounds a lot like arthritis, but she really feels it is related to her cholesterol medicine.  Overall, at her last office visit I felt she was doing fairly well. Unfortunately about 5 days after that visit she started to have acute onset chest discomfort with radiation of pain into her left arm. She presented to the emergency department and was found to have unstable angina. Cardiac catheterization was performed which demonstrated 99% in-stent stenosis at the previously placed mid RCA stent.  I am interested as to why she presented with acute unstable angina, and not progressive anginal symptoms with exertion. She certainly had absolutely no chest pain or arm pain just a few days prior to the presentation, which is more consistent with an acute coronary syndrome.  Kristin Shaffer returns today for follow-up. She reports that she has had to take nitroglycerin twice for chest discomfort. Wants while riding in a car and another time at rest. Both episodes sound more like reflux however she did have improvement after about 15 minutes. She recently is been having problems with bruising on aspirin and Effient and is concerned about being on strong blood thinners. Her last stent  placement was in 11/2013. Dual antiplatelet therapy was recommended for at least a year.  I saw Kristin Shaffer back in the office today. She is complaining of some weakness and fatigue. She also soreness in her muscles, particularly when she gets up in the morning. Some of it seems to be joint soreness which is  concerning for osteoarthritis. She clearly has signs of osteoarthritis in her DIP joints and there is some crepitus in her knees. It is however possible that some of her symptoms could be related to Lipitor. She is on combination Lipitor and fenofibrate, which is slightly higher risk of developing myalgias. Unfortunately, her cholesterol has been really well controlled.   Kristin Shaffer returns today for follow-up. She recently was planting some irises in her lawn and is complaining of some back pain and spasm. In the past she had some relief from Flexeril, however does not currently have any. She denies any cardiac chest pain. She does get some occasional reflux symptoms associated with certain foods.  Kristin Shaffer see him back in the office again today. She has some intermittent chest discomfort. The symptoms seem atypical and are not like the chest and left arm pain she had in the past. She recently saw her primary care provider who noted some possible fluid in the right lung base. I do hear some faint crackles today however it sounds to me more like atelectasis or even fibrosis. She reports some shortness of breath. She also thinks she may be having reflux symptoms but is hesitant to take omeprazole because she says that she feels that could cause her chest pain. I reassured her that it would not.  04/14/2016  Kristin Shaffer was seen back today in follow-up for review of her echocardiogram. This is essentially unchanged compared to her prior study in 2014. Her pulmonary pressure is elevated at 45 mmHg. LVEF is 60-65%. There is mild diastolic dysfunction. Although her primary pressure is elevated, this is not likely a significant cause of her shortness of breath given the mild elevation. I am concerned however that she may have underlying pulmonary fibrosis. There is evidence of this by chest x-ray in 2015 and she has some dry crackles on exam. I do not believe there is interstitial edema. She is also complaining  now of right flank pain. She has some CVA tenderness but has had no fever, chills or shaking chills. She recently had 2 UTIs apparently which eventually improved with Cipro. She denies any chest pain.  05/22/2016  Mrs. Langowski returns today for follow-up. She underwent CT scan of the chest due to some interstitial changes on her chest x-ray and right flank/lower right posterior chest pain. She reports that this chest pain has improved and resolved although she still notes some shortness of breath with exertion. The CT scan shows a pattern of rather widespread groundglass attenuation most evident in the mid to lower lung fields. There are patchy areas of mild subpleural reticulation noted as well. This is concerning for either non-specific interstitial pneumonia or possibly IPF. Based on these findings, I would recommend a pulmonary evaluation. There is no evidence of a pneumonia. Her BNP is low at 40 and I did not suspect this is congestive heart failure or pulmonary edema. Finally she underwent abdominal ultrasound which demonstrated moderate aortoiliac atherosclerosis which will need follow-up annually.  PMHx:  Past Medical History  Diagnosis Date  . Hypertension   . GERD (gastroesophageal reflux disease)   . Hypothyroidism   . Skin cancer     "  both legs; right arm" (03/20/2013)  . High cholesterol   . Coronary artery disease   . Myocardial infarction [facility])     "Dr's saw evidence I might have had a heart attack" (03/20/2013)  . Pneumonia 2012  . Exertional shortness of breath   . Arthritis     "different places; not bad" (03/20/2013)  . Anxiety   . NSTEMI (non-ST elevated myocardial infarction) ([place]) 03/20/2013    cath - mid RCA    Past Surgical History  Procedure Laterality Date  . Appendectomy  2003  . Dilation and curettage of uterus  1956?  Marland Kitchen Cataract extraction w/ intraocular lens  implant, bilateral Bilateral ~ 2008  . Breast biopsy Bilateral 1990s    "total of 5; 2 on one side, 3  on the other; all benign" (03/20/2013)  . Skin cancer excision      "1 off right arm; 2 off each leg" (03/20/2013)  . Transthoracic echocardiogram  03/2013    EF 123456, grade 1 diastolic dysfunction; mild MR; calcifed MV annulus; LA mildly dilated  . Coronary angioplasty with stent placement  03/20/2013    NSTEMI - subtotal occlusion of mid RCA - PCI of mid RCA of long tubular 40-60% lesion - 2 tandem 95-99% subtotal occlusions - 3 overlapping Xience Xpedition DES (Dr. Roni Bread)   . Coronary angioplasty  12/08/2013  . Left heart catheterization with coronary angiogram N/A 03/20/2013    Procedure: LEFT HEART CATHETERIZATION WITH CORONARY ANGIOGRAM;  Surgeon: Sanda Klein, MD;  Location: [facility] CATH LAB;  Service: Cardiovascular;  Laterality: N/A;  . Left heart catheterization with coronary angiogram N/A 12/08/2013    Procedure: LEFT HEART CATHETERIZATION WITH CORONARY ANGIOGRAM;  Surgeon: Blane Ohara, MD;  Location: [facility] CATH LAB;  Service: Cardiovascular;  Laterality: N/A;    FAMHx:  Family History  Problem Relation Age of Onset  . Dementia Mother   . Lung cancer Father   . Cancer Brother   . CAD Father 49    SOCHx:   reports that she has never smoked. She has never used smokeless tobacco. She reports that she does not drink alcohol or use illicit drugs.  ALLERGIES:  Allergies  Allergen Reactions  . Atorvastatin Other (See Comments)    Leg weakness and cramps  . Zocor [Simvastatin] Other (See Comments)    Leg weakness and cramping  . Latex Hives and Rash    ROS: Pertinent items noted in HPI and remainder of comprehensive ROS otherwise negative.  HOME MEDS: Current Outpatient Prescriptions  Medication Sig Dispense Refill  . aspirin EC 81 MG tablet Take 81 mg by mouth daily.    Marland Kitchen atorvastatin (LIPITOR) 20 MG tablet TAKE 1 TABLET EVERY DAY 90 tablet 2  . calcium gluconate 500 MG tablet Take 1 tablet by mouth daily.    . clopidogrel (PLAVIX) 75 MG tablet TAKE 1 TABLET (75 MG  TOTAL) BY MOUTH DAILY. 90 tablet 2  . cyclobenzaprine (FLEXERIL) 5 MG tablet Take 1 tablet (5 mg total) by mouth 2 (two) times daily as needed for muscle spasms. 20 tablet 0  . ezetimibe (ZETIA) 10 MG tablet Take 5 mg by mouth daily.    . fenofibrate 54 MG tablet Take 1 tablet (54 mg total) by mouth daily. 90 tablet 2  . levothyroxine (SYNTHROID, LEVOTHROID) 100 MCG tablet Take 100 mcg by mouth daily before breakfast.    . lisinopril (PRINIVIL,ZESTRIL) 10 MG tablet TAKE 1 TABLET BY MOUTH EVERY DAY 90 tablet 2  . Magnesium  Oxide 250 MG TABS Take 1 tablet by mouth daily.    . metoprolol tartrate (LOPRESSOR) 25 MG tablet TAKE 1/2 TABLET TWICE DAILY 90 tablet 0  . nitroGLYCERIN (NITROSTAT) 0.4 MG SL tablet Place 1 tablet (0.4 mg total) under the tongue every 5 (five) minutes as needed for chest pain. 30 tablet 2  . omeprazole (PRILOSEC) 20 MG capsule Take 20 mg by mouth 2 (two) times daily.     . potassium chloride SA (K-DUR,KLOR-CON) 20 MEQ tablet Take 1 tablet (20 mEq total) by mouth daily. 30 tablet 10   No current facility-administered medications for this visit.    LABS/IMAGING: No results found for this or any previous visit (from the past 48 hour(s)). No results found.  VITALS: BP 130/78 mmHg  Pulse 61  Ht 5\' 5"  (1.651 m)  Wt 177 lb 12.8 oz (80.65 kg)  BMI 29.59 kg/m2  SpO2 96%  EXAM: Deferred  EKG: Deferred  ASSESSMENT: 1. Coronary artery disease status post recent and STEMI, PCI to the RCA with 4 overlapping Xience drug-eluting stents - now with recent UA and ISR 2. Hypertension 3. Hypothyroidism 4. GERD 5. Dyslipidemia - now on lipitor, zetia and fenofibrate 6. Low back spasm 7. Progressive DOE - possible pulmonary fibrosis on CT 8. Pulmonary hypertension  9. Right flank pain 10. Moderate aorto-iliac atherosclerosis  PLAN: 1.   Mrs. Ocean reports her right flank pain is improved however she is still short of breath. There are abnormal findings on her CT scan and  I've referred her to Dr. Lake Bells with pulmonary for further evaluation. There is no evidence for volume overload or congestive heart failure on exam or lab work. Plan follow-up with me in 6 months.   Pixie Casino, MD, [facility] Attending Cardiologist [place] [facility] 05/22/2016, 11:54 AM

## 2016-05-22 NOTE — Patient Instructions (Addendum)
Medication Instructions:  Your physician recommends that you continue on your current medications as directed. Please refer to the Current Medication list given to you today.    Follow-Up: Your physician wants you to follow-up in: 6 MONTHS WITH DR HILTY. You will receive a reminder letter in the mail two months in advance. If you don't receive a letter, please call our office to schedule the follow-up appointment.   Any Other Special Instructions Will Be Listed Below (If Applicable).     If you need a refill on your cardiac medications before your next appointment, please call your pharmacy.   

## 2016-06-16 ENCOUNTER — Other Ambulatory Visit (INDEPENDENT_AMBULATORY_CARE_PROVIDER_SITE_OTHER): Payer: Medicare Other

## 2016-06-16 ENCOUNTER — Ambulatory Visit (INDEPENDENT_AMBULATORY_CARE_PROVIDER_SITE_OTHER): Payer: Medicare Other | Admitting: Pulmonary Disease

## 2016-06-16 ENCOUNTER — Encounter: Payer: Self-pay | Admitting: Pulmonary Disease

## 2016-06-16 VITALS — BP 130/82 | HR 56 | Ht 64.0 in | Wt 179.0 lb

## 2016-06-16 DIAGNOSIS — J8410 Pulmonary fibrosis, unspecified: Secondary | ICD-10-CM

## 2016-06-16 DIAGNOSIS — J849 Interstitial pulmonary disease, unspecified: Secondary | ICD-10-CM

## 2016-06-16 LAB — C-REACTIVE PROTEIN: CRP: 0.2 mg/dL — AB (ref 0.5–20.0)

## 2016-06-16 LAB — SEDIMENTATION RATE: SED RATE: 7 mm/h (ref 0–30)

## 2016-06-16 NOTE — Assessment & Plan Note (Addendum)
I have reviewed the images of the CT chest from this year which showed mild groundglass throughout her lungs with some mild interlobular septal thickening.  On exam she does have fine crackles in the bases of her lungs.  She does have a form of pulmonary fibrosis, it's uncertain at this time the exact etiology. The only way to know for certain would be to perform an open lung biopsy which is too risky considering her advanced age and very mild symptoms. Fortunately, she has minimal symptoms and the findings on chest CT are not advanced. I cannot say at this time if this is just the point of chemical exposure when she was a child (multiple pesticides) or if this represents a slow indolent process such as nonspecific interstitial pneumonitis, or if this is the beginning of more ominous process such as usual interstitial pneumonitis.  Plan: Pulmonary function tests now and again in 6 months High-resolution CT scan in 6 months If there is evidence of progression that would be consistent with usual interstitial pneumonitis Serologic panel today to evaluate for underlying connective tissue disease F/u 6 months

## 2016-06-16 NOTE — Progress Notes (Signed)
Subjective:    Patient ID: Kristin Shaffer, female    DOB: 1931-06-05, 82 y.o.   MRN: [id number]  HPI Chief Complaint  Patient presents with  . Consult    Referred by Dr. Debara Pickett for pulmonary fibrosis. She c/o SOB and a dry cough at bedtime. Denies any chest congestion/tightness, sinus pressure/drainage, fever, nausea or vomiting.     This is a pleasant 82 year old female who is referred to me by Dr. Debara Pickett with cardiology for evaluation of pulmonary fibrosis. She states that she had a normal childhood without respiratory illnesses and her only family history of pulmonary issues is that her father died of lung cancer after smoking for many years. As a child she [place] and she says she frequently remembers being exposed to heavy amounts of pesticides. She never worked directly with the pesticides but she describes seeing thick clouds wafting through her yard when her father was spring tobacco for various past including worms etc. She specifically remembers exposure to DDT, she is unaware of exposure to paraquat. 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 She has never smoked cigarettes.  She says that about 3 years ago she started noticing shortness of breath on exertion. This occurred after her heart attack. Specifically, she says that she has some shortness of breath when she rolls or garbage can out to the end of her road which is about 100 yards. She also notices shortness of breath when she climbs a flight of stairs or if she carries groceries. She has a dry cough which has been persistent for the last year.  She has various joint aches but denies redness and swelling of joints. She denies dry eyes or dry mouth or trouble swallowing. She denies unexplained weight loss or rash.  Past Medical History  Diagnosis Date  . Hypertension   . GERD (gastroesophageal reflux disease)   . Hypothyroidism   . Skin cancer     "both legs; right arm"  (03/20/2013)  . High cholesterol   . Coronary artery disease   . Myocardial infarction [facility])     "Dr's saw evidence I might have had a heart attack" (03/20/2013)  . Pneumonia 2012  . Exertional shortness of breath   . Arthritis     "different places; not bad" (03/20/2013)  . Anxiety   . NSTEMI (non-ST elevated myocardial infarction) ([place]) 03/20/2013    cath - mid RCA     Family History  Problem Relation Age of Onset  . Dementia Mother   . Lung cancer Father   . Cancer Brother   . CAD Father 65     Social History   Social History  . Marital Status: Widowed    Spouse Name: N/A  . Number of Children: N/A  . Years of Education: N/A   Occupational History  . retired     Social History Main Topics  . Smoking status: Never Smoker   . Smokeless tobacco: Never Used  . Alcohol Use: No  . Drug Use: No  . Sexual Activity: No   Other Topics Concern  . Not on file   Social History Narrative   Lives alone.      Allergies  Allergen Reactions  . Atorvastatin Other (See Comments)    Leg weakness and cramps  . Zocor [Simvastatin] Other (See Comments)    Leg weakness and cramping  . Latex Hives and Rash     Outpatient  Prescriptions Prior to Visit  Medication Sig Dispense Refill  . aspirin EC 81 MG tablet Take 81 mg by mouth daily.    Marland Kitchen atorvastatin (LIPITOR) 20 MG tablet TAKE 1 TABLET EVERY DAY 90 tablet 2  . calcium gluconate 500 MG tablet Take 1 tablet by mouth daily.    . clopidogrel (PLAVIX) 75 MG tablet TAKE 1 TABLET (75 MG TOTAL) BY MOUTH DAILY. 90 tablet 2  . cyclobenzaprine (FLEXERIL) 5 MG tablet Take 1 tablet (5 mg total) by mouth 2 (two) times daily as needed for muscle spasms. 20 tablet 0  . ezetimibe (ZETIA) 10 MG tablet Take 5 mg by mouth daily.    . fenofibrate 54 MG tablet Take 1 tablet (54 mg total) by mouth daily. 90 tablet 2  . levothyroxine (SYNTHROID, LEVOTHROID) 100 MCG tablet Take 100 mcg by mouth daily before breakfast.    . lisinopril  (PRINIVIL,ZESTRIL) 10 MG tablet TAKE 1 TABLET BY MOUTH EVERY DAY 90 tablet 2  . Magnesium Oxide 250 MG TABS Take 1 tablet by mouth daily.    . metoprolol tartrate (LOPRESSOR) 25 MG tablet TAKE 1/2 TABLET TWICE DAILY 90 tablet 0  . nitroGLYCERIN (NITROSTAT) 0.4 MG SL tablet Place 1 tablet (0.4 mg total) under the tongue every 5 (five) minutes as needed for chest pain. 30 tablet 2  . omeprazole (PRILOSEC) 20 MG capsule Take 20 mg by mouth 2 (two) times daily.     . potassium chloride SA (K-DUR,KLOR-CON) 20 MEQ tablet Take 1 tablet (20 mEq total) by mouth daily. 30 tablet 10   No facility-administered medications prior to visit.       Review of Systems  Constitutional: Negative for fever and unexpected weight change.  HENT: Negative for congestion, dental problem, ear pain, nosebleeds, postnasal drip, rhinorrhea, sinus pressure, sneezing, sore throat and trouble swallowing.   Eyes: Negative for redness and itching.  Respiratory: Positive for cough and shortness of breath. Negative for chest tightness and wheezing.   Cardiovascular: Negative for palpitations and leg swelling.  Gastrointestinal: Negative for nausea and vomiting.  Genitourinary: Negative for dysuria.  Musculoskeletal: Negative for joint swelling.  Skin: Negative for rash.  Neurological: Negative for headaches.  Hematological: Does not bruise/bleed easily.  Psychiatric/Behavioral: Negative for dysphoric mood. The patient is not nervous/anxious.        Objective:   Physical Exam Filed Vitals:   06/16/16 1337  BP: 130/82  Pulse: 56  Height: 5\' 4"  (1.626 m)  Weight: 179 lb (81.194 kg)  SpO2: 98%   RA  Gen: well appearing, no acute distress HENT: NCAT, OP clear, neck supple without masses Eyes: PERRL, EOMi Lymph: no cervical lymphadenopathy PULM: Crackles bases bilaterally, normal air movement CV: RRR, systolic murmur RUSB, no JVD GI: BS+, soft, nontender, no hsm Derm: no rash or skin breakdown MSK: normal bulk  and tone Neuro: A&Ox4, CN II-XII intact, strength 5/5 in all 4 extremities Psyche: normal mood and affect  Records cardiology were reviewed where she was treated for her coronary artery disease and referred to me for abnormal CT scan of the chest  CT chest images personally reviewed showing fine interstitial infiltrate (reticular changes periphery) and  in the bases with slight bronchiolectasis and some air-trapping. There is very mild groundglass as well throughout..       Assessment & Plan:  Pulmonary fibrosis ([place]) I have reviewed the images of the CT chest from this year which showed mild groundglass throughout her lungs with some mild interlobular septal thickening.  On exam she does have fine crackles in the bases of her lungs.  She does have a form of pulmonary fibrosis, it's uncertain at this time the exact etiology. The only way to know for certain would be to perform an open lung biopsy which is too risky considering her advanced age and very mild symptoms. Fortunately, she has minimal symptoms and the findings on chest CT are not advanced. I cannot say at this time if this is just the point of chemical exposure when she was a child (multiple pesticides) or if this represents a slow indolent process such as nonspecific interstitial pneumonitis, or if this is the beginning of more ominous process such as usual interstitial pneumonitis.  Plan: Pulmonary function tests now and again in 6 months High-resolution CT scan in 6 months If there is evidence of progression that would be consistent with usual interstitial pneumonitis Serologic panel today to evaluate for underlying connective tissue disease F/u 6 months     Current outpatient prescriptions:  .  aspirin EC 81 MG tablet, Take 81 mg by mouth daily., Disp: , Rfl:  .  atorvastatin (LIPITOR) 20 MG tablet, TAKE 1 TABLET EVERY DAY, Disp: 90 tablet, Rfl: 2 .  calcium gluconate 500 MG tablet, Take 1 tablet by mouth daily., Disp:  , Rfl:  .  clopidogrel (PLAVIX) 75 MG tablet, TAKE 1 TABLET (75 MG TOTAL) BY MOUTH DAILY., Disp: 90 tablet, Rfl: 2 .  cyclobenzaprine (FLEXERIL) 5 MG tablet, Take 1 tablet (5 mg total) by mouth 2 (two) times daily as needed for muscle spasms., Disp: 20 tablet, Rfl: 0 .  ezetimibe (ZETIA) 10 MG tablet, Take 5 mg by mouth daily., Disp: , Rfl:  .  fenofibrate 54 MG tablet, Take 1 tablet (54 mg total) by mouth daily., Disp: 90 tablet, Rfl: 2 .  levothyroxine (SYNTHROID, LEVOTHROID) 100 MCG tablet, Take 100 mcg by mouth daily before breakfast., Disp: , Rfl:  .  lisinopril (PRINIVIL,ZESTRIL) 10 MG tablet, TAKE 1 TABLET BY MOUTH EVERY DAY, Disp: 90 tablet, Rfl: 2 .  Magnesium Oxide 250 MG TABS, Take 1 tablet by mouth daily., Disp: , Rfl:  .  metoprolol tartrate (LOPRESSOR) 25 MG tablet, TAKE 1/2 TABLET TWICE DAILY, Disp: 90 tablet, Rfl: 0 .  nitroGLYCERIN (NITROSTAT) 0.4 MG SL tablet, Place 1 tablet (0.4 mg total) under the tongue every 5 (five) minutes as needed for chest pain., Disp: 30 tablet, Rfl: 2 .  omeprazole (PRILOSEC) 20 MG capsule, Take 20 mg by mouth 2 (two) times daily. , Disp: , Rfl:  .  potassium chloride SA (K-DUR,KLOR-CON) 20 MEQ tablet, Take 1 tablet (20 mEq total) by mouth daily., Disp: 30 tablet, Rfl: 10

## 2016-06-16 NOTE — Addendum Note (Signed)
Addended by: Beckie Busing on: 06/16/2016 02:13 PM   Modules accepted: Orders

## 2016-06-16 NOTE — Addendum Note (Signed)
Addended by: Beckie Busing on: 06/16/2016 02:12 PM   Modules accepted: Orders

## 2016-06-16 NOTE — Patient Instructions (Signed)
We will arrange a lung function tests now and again in 6 months We will arrange another CT scan in 6 months We will call you the results of today's chest x-ray We will see you back in 6 months or sooner if needed

## 2016-06-17 LAB — RHEUMATOID FACTOR: Rhuematoid fact SerPl-aCnc: <10 IU/mL (ref <=14)

## 2016-06-19 ENCOUNTER — Other Ambulatory Visit: Payer: Self-pay | Admitting: Internal Medicine

## 2016-06-19 ENCOUNTER — Telehealth: Payer: Self-pay | Admitting: Pulmonary Disease

## 2016-06-19 ENCOUNTER — Encounter (INDEPENDENT_AMBULATORY_CARE_PROVIDER_SITE_OTHER): Payer: Medicare Other | Admitting: Pulmonary Disease

## 2016-06-19 DIAGNOSIS — J849 Interstitial pulmonary disease, unspecified: Secondary | ICD-10-CM | POA: Diagnosis not present

## 2016-06-19 LAB — PULMONARY FUNCTION TEST
DL/VA % PRED: 87 %
DL/VA: 4.23 ml/min/mmHg/L
DLCO COR % PRED: 75 %
DLCO cor: 18.77 ml/min/mmHg
DLCO unc % pred: 77 %
DLCO unc: 19.27 ml/min/mmHg
FEF 25-75 POST: 3.33 L/s
FEF 25-75 Pre: 3.28 L/sec
FEF2575-%CHANGE-POST: 1 %
FEF2575-%Pred-Post: 264 %
FEF2575-%Pred-Pre: 260 %
FEV1-%Change-Post: 2 %
FEV1-%Pred-Post: 121 %
FEV1-%Pred-Pre: 117 %
FEV1-POST: 2.27 L
FEV1-Pre: 2.2 L
FEV1FVC-%CHANGE-POST: 3 %
FEV1FVC-%Pred-Pre: 121 %
FEV6-%Change-Post: 0 %
FEV6-%PRED-PRE: 103 %
FEV6-%Pred-Post: 104 %
FEV6-PRE: 2.46 L
FEV6-Post: 2.47 L
FEV6FVC-%Pred-Post: 106 %
FEV6FVC-%Pred-Pre: 106 %
FVC-%CHANGE-POST: 0 %
FVC-%PRED-POST: 98 %
FVC-%PRED-PRE: 98 %
FVC-POST: 2.47 L
FVC-PRE: 2.48 L
POST FEV1/FVC RATIO: 92 %
POST FEV6/FVC RATIO: 100 %
PRE FEV1/FVC RATIO: 89 %
Pre FEV6/FVC Ratio: 100 %
RV % pred: 99 %
RV: 2.47 L
TLC % PRED: 96 %
TLC: 4.94 L

## 2016-06-19 LAB — CENTROMERE ANTIBODIES: CENTROMERE AB SCREEN: NEGATIVE

## 2016-06-19 LAB — ANTI-SCLERODERMA ANTIBODY: SCLERODERMA (SCL-70) (ENA) ANTIBODY, IGG: NEGATIVE

## 2016-06-19 LAB — SJOGREN'S SYNDROME ANTIBODS(SSA + SSB)
SSA (Ro) (ENA) Antibody, IgG: <1
SSB (LA) (ENA) ANTIBODY, IGG: NEGATIVE

## 2016-06-19 LAB — JO-1 ANTIBODY-IGG: JO-1 ANTIBODY, IGG: NEGATIVE

## 2016-06-19 LAB — ALDOLASE: Aldolase: 3.5 U/L (ref <=8.1)

## 2016-06-19 LAB — CYCLIC CITRUL PEPTIDE ANTIBODY, IGG: Cyclic Citrullin Peptide Ab: <16 Units

## 2016-06-19 NOTE — Telephone Encounter (Signed)
BQ please advise about lab results.  Pt would like to have the results.  thanks

## 2016-06-20 NOTE — Telephone Encounter (Signed)
Called spoke with pt. Reviewed BQ's results and recs. Pt voiced understanding and had no further questions.

## 2016-06-20 NOTE — Telephone Encounter (Signed)
Pt returning call

## 2016-06-20 NOTE — Telephone Encounter (Signed)
All normal.

## 2016-06-20 NOTE — Telephone Encounter (Signed)
[id number] x1 

## 2016-06-23 ENCOUNTER — Telehealth: Payer: Self-pay | Admitting: Pulmonary Disease

## 2016-06-23 NOTE — Telephone Encounter (Signed)
Spoke with pt, requesting PFT results, would also like to know why she had PFT.    BQ please advise.  Thanks!

## 2016-06-26 NOTE — Telephone Encounter (Signed)
Her test showed that her lungs show near normal lung function despite her pulmonary fibrosis on CT chest (reason for the test).

## 2016-06-26 NOTE — Telephone Encounter (Signed)
Spoke with pt. She is aware of results. Nothing further was needed.  

## 2016-06-30 ENCOUNTER — Encounter (HOSPITAL_COMMUNITY): Payer: Self-pay

## 2016-06-30 ENCOUNTER — Emergency Department (HOSPITAL_COMMUNITY): Payer: Medicare Other

## 2016-06-30 ENCOUNTER — Emergency Department (HOSPITAL_COMMUNITY)
Admission: EM | Admit: 2016-06-30 | Discharge: 2016-06-30 | Disposition: A | Discharge status: Home / Self Care | Payer: Medicare Other | Attending: Emergency Medicine | Admitting: Emergency Medicine

## 2016-06-30 DIAGNOSIS — Z7902 Long term (current) use of antithrombotics/antiplatelets: Secondary | ICD-10-CM | POA: Insufficient documentation

## 2016-06-30 DIAGNOSIS — I25110 Atherosclerotic heart disease of native coronary artery with unstable angina pectoris: Secondary | ICD-10-CM | POA: Diagnosis not present

## 2016-06-30 DIAGNOSIS — Z7982 Long term (current) use of aspirin: Secondary | ICD-10-CM | POA: Insufficient documentation

## 2016-06-30 DIAGNOSIS — I10 Essential (primary) hypertension: Secondary | ICD-10-CM | POA: Insufficient documentation

## 2016-06-30 DIAGNOSIS — Z91040 Latex allergy status: Secondary | ICD-10-CM | POA: Insufficient documentation

## 2016-06-30 DIAGNOSIS — Z955 Presence of coronary angioplasty implant and graft: Secondary | ICD-10-CM | POA: Diagnosis not present

## 2016-06-30 DIAGNOSIS — Z85828 Personal history of other malignant neoplasm of skin: Secondary | ICD-10-CM | POA: Insufficient documentation

## 2016-06-30 DIAGNOSIS — I252 Old myocardial infarction: Secondary | ICD-10-CM | POA: Insufficient documentation

## 2016-06-30 DIAGNOSIS — M545 Low back pain, unspecified: Secondary | ICD-10-CM

## 2016-06-30 DIAGNOSIS — E039 Hypothyroidism, unspecified: Secondary | ICD-10-CM | POA: Diagnosis not present

## 2016-06-30 LAB — URINALYSIS, ROUTINE W REFLEX MICROSCOPIC
Bilirubin Urine: NEGATIVE
GLUCOSE, UA: NEGATIVE mg/dL
HGB URINE DIPSTICK: NEGATIVE
Ketones, ur: NEGATIVE mg/dL
Nitrite: NEGATIVE
PH: 5.5 (ref 5.0–8.0)
PROTEIN: NEGATIVE mg/dL
Specific Gravity, Urine: 1.02 (ref 1.005–1.030)

## 2016-06-30 LAB — URINE MICROSCOPIC-ADD ON

## 2016-06-30 MED ORDER — MORPHINE SULFATE (PF) 4 MG/ML IV SOLN
4.0000 mg | Freq: Once | INTRAVENOUS | Status: AC
  Administered 2016-06-30: 4 mg via INTRAVENOUS
  Filled 2016-06-30: qty 1

## 2016-06-30 MED ORDER — LORAZEPAM 2 MG/ML IJ SOLN
1.0000 mg | Freq: Once | INTRAMUSCULAR | Status: AC
  Administered 2016-06-30: 1 mg via INTRAVENOUS
  Filled 2016-06-30: qty 1

## 2016-06-30 MED ORDER — HYDROCODONE-ACETAMINOPHEN 5-325 MG PO TABS: 1.0000 | ORAL_TABLET | ORAL | 0 refills | Status: DC | PRN

## 2016-06-30 MED ORDER — MELOXICAM 7.5 MG PO TABS: 7.5000 mg | ORAL_TABLET | Freq: Every day | ORAL | 0 refills | Status: DC

## 2016-06-30 MED ORDER — METHOCARBAMOL 500 MG PO TABS: 500.0000 mg | ORAL_TABLET | Freq: Three times a day (TID) | ORAL | 0 refills | Status: DC | PRN

## 2016-06-30 NOTE — ED Provider Notes (Signed)
[facility] DEPT Provider Note   CSN: [id number] Arrival date & time: 06/30/16  1503  First Provider Contact:  First MD Initiated Contact with Patient 06/30/16 1730        History   Chief Complaint Chief Complaint  Patient presents with  . Back Pain    HPI Kristin Shaffer is a 82 y.o. female who presents with back pain. PMH significant for arthritis. She states that she was walking in her home 4 days ago when she had an acute onset of pain in her back which "grabbed her". She states that she saw her PCP who rx'ed Flexaril and Norco which did help some however her PCP wanted her to come the ED for further evaluation. The pain is in the low back and on the left side. It does not radiate. Movement makes it worse. Norco has made it minimally better. She reports that she has had difficulty walking due to pain as well as increased urinary incontinence although she states she has had this in the past. Denies fever, bowel incontinence, urinary retention, leg weakness or paresthesias. Also denies chest pain, SOB, abdominal pain.   HPI  Past Medical History:  Diagnosis Date  . Anxiety   . Arthritis    "different places; not bad" (03/20/2013)  . Coronary artery disease   . Exertional shortness of breath   . GERD (gastroesophageal reflux disease)   . High cholesterol   . Hypertension   . Hypothyroidism   . Myocardial infarction [facility])    "Dr's saw evidence I might have had a heart attack" (03/20/2013)  . NSTEMI (non-ST elevated myocardial infarction) ([place]) 03/20/2013   cath - mid RCA  . Pneumonia 2012  . Skin cancer    "both legs; right arm" (03/20/2013)    Patient Active Problem List   Diagnosis Date Noted  . Pulmonary fibrosis ([place]) 05/22/2016  . DOE (dyspnea on exertion) 04/14/2016  . Right flank pain 04/14/2016  . Pulmonary hypertension ([place]) 04/14/2016  . Muscle spasm of back 09/15/2015  . Myalgia 03/23/2015  . Intermediate coronary syndrome ([place]) 12/07/2013  .  Hyperlipidemia 12/03/2013  . Presence of drug coated stent in right coronary artery 03/22/2013  . NSTEMI (non-ST elevated myocardial infarction) ([place]) 03/21/2013  . Unstable angina 03/20/2013  . Hypertensive urgency -Accelerated Hypertension 03/20/2013  . GERD (gastroesophageal reflux disease) 03/20/2013  . Hypothyroidism 03/20/2013    Past Surgical History:  Procedure Laterality Date  . APPENDECTOMY  2003  . BREAST BIOPSY Bilateral 1990s   "total of 5; 2 on one side, 3 on the other; all benign" (03/20/2013)  . CATARACT EXTRACTION W/ INTRAOCULAR LENS  IMPLANT, BILATERAL Bilateral ~ 2008  . CORONARY ANGIOPLASTY  12/08/2013  . CORONARY ANGIOPLASTY WITH STENT PLACEMENT  03/20/2013   NSTEMI - subtotal occlusion of mid RCA - PCI of mid RCA of long tubular 40-60% lesion - 2 tandem 95-99% subtotal occlusions - 3 overlapping Xience Xpedition DES (Dr. Roni Bread)   . [place] OF UTERUS  1956?  Marland Kitchen LEFT HEART CATHETERIZATION WITH CORONARY ANGIOGRAM N/A 03/20/2013   Procedure: LEFT HEART CATHETERIZATION WITH CORONARY ANGIOGRAM;  Surgeon: Sanda Klein, MD;  Location: [facility] CATH LAB;  Service: Cardiovascular;  Laterality: N/A;  . LEFT HEART CATHETERIZATION WITH CORONARY ANGIOGRAM N/A 12/08/2013   Procedure: LEFT HEART CATHETERIZATION WITH CORONARY ANGIOGRAM;  Surgeon: Blane Ohara, MD;  Location: [facility] CATH LAB;  Service: Cardiovascular;  Laterality: N/A;  . SKIN CANCER EXCISION     "1 off right arm;  2 off each leg" (03/20/2013)  . TRANSTHORACIC ECHOCARDIOGRAM  03/2013   EF 123456, grade 1 diastolic dysfunction; mild MR; calcifed MV annulus; LA mildly dilated    OB History    No data available       Home Medications    Prior to Admission medications   Medication Sig Start Date End Date Taking? Authorizing Provider  aspirin EC 81 MG tablet Take 81 mg by mouth daily.    Historical Provider, MD  atorvastatin (LIPITOR) 20 MG tablet TAKE 1 TABLET EVERY DAY 11/12/15   Pixie Casino, MD   calcium gluconate 500 MG tablet Take 1 tablet by mouth daily.    Historical Provider, MD  clopidogrel (PLAVIX) 75 MG tablet TAKE 1 TABLET (75 MG TOTAL) BY MOUTH DAILY. 11/01/15   Pixie Casino, MD  cyclobenzaprine (FLEXERIL) 5 MG tablet Take 1 tablet (5 mg total) by mouth 2 (two) times daily as needed for muscle spasms. 09/15/15   Pixie Casino, MD  ezetimibe (ZETIA) 10 MG tablet Take 5 mg by mouth daily.    Historical Provider, MD  fenofibrate 54 MG tablet Take 1 tablet (54 mg total) by mouth daily. 10/05/15   Pixie Casino, MD  levothyroxine (SYNTHROID, LEVOTHROID) 100 MCG tablet Take 100 mcg by mouth daily before breakfast.    Historical Provider, MD  lisinopril (PRINIVIL,ZESTRIL) 10 MG tablet TAKE 1 TABLET BY MOUTH EVERY DAY 06/18/14   Pixie Casino, MD  Magnesium Oxide 250 MG TABS Take 1 tablet by mouth daily.    Historical Provider, MD  metoprolol tartrate (LOPRESSOR) 25 MG tablet TAKE 1/2 TABLET TWICE DAILY 06/19/16   Pixie Casino, MD  nitroGLYCERIN (NITROSTAT) 0.4 MG SL tablet Place 1 tablet (0.4 mg total) under the tongue every 5 (five) minutes as needed for chest pain. 09/15/15   Pixie Casino, MD  omeprazole (PRILOSEC) 20 MG capsule Take 20 mg by mouth 2 (two) times daily.     Historical Provider, MD  potassium chloride SA (K-DUR,KLOR-CON) 20 MEQ tablet Take 1 tablet (20 mEq total) by mouth daily. 05/20/13   Pixie Casino, MD    Family History Family History  Problem Relation Age of Onset  . Dementia Mother   . Lung cancer Father   . CAD Father 15  . Cancer Brother     Social History Social History  Substance Use Topics  . Smoking status: Never Smoker  . Smokeless tobacco: Never Used  . Alcohol use No     Allergies   Atorvastatin; Zocor [simvastatin]; and Latex   Review of Systems Review of Systems  Constitutional: Negative for fever.  Respiratory: Negative for shortness of breath.   Cardiovascular: Negative for chest pain.  Gastrointestinal: Negative  for abdominal pain.  Musculoskeletal: Positive for back pain, gait problem and myalgias.  Neurological: Negative for weakness and numbness.  All other systems reviewed and are negative.    Physical Exam Updated Vital Signs BP 155/84   Pulse 67   Temp 97.5 F (36.4 C) (Oral)   Resp 18   Ht 5\' 5"  (1.651 m)   Wt 80.7 kg   SpO2 97%   BMI 29.62 kg/m   Physical Exam  Constitutional: She is oriented to person, place, and time. She appears well-developed and well-nourished. No distress.  HENT:  Head: Normocephalic and atraumatic.  Eyes: Conjunctivae are normal. Pupils are equal, round, and reactive to light. Right eye exhibits no discharge. Left eye exhibits no discharge. No scleral icterus.  Neck: Normal range of motion. Neck supple.  Cardiovascular: Normal rate and regular rhythm.   No murmur heard. Pulmonary/Chest: Effort normal and breath sounds normal. No respiratory distress.  Abdominal: Soft. She exhibits no distension. There is no tenderness.  Musculoskeletal: She exhibits no edema.  Inspection: No masses, deformity, or rash Palpation: Midline lumbar spinal tenderness and left sided lumbar paraspinal muscle tenderness. ROM: Deferred Strength: 5/5 in lower extremities and normal plantar and dorsiflexion Sensation: Intact sensation with light touch in lower extremities bilaterally Gait: Deferred Reflexes: Patellar reflex is 1+ bilaterally SLR: Negative supine straight leg raise   Neurological: She is alert and oriented to person, place, and time.  Skin: Skin is warm and dry.  Psychiatric: She has a normal mood and affect. Her behavior is normal.  Nursing note and vitals reviewed.    ED Treatments / Results  Labs (all labs ordered are listed, but only abnormal results are displayed) Labs Reviewed  URINALYSIS, ROUTINE W REFLEX MICROSCOPIC (NOT AT [facility]) - Abnormal; Notable for the following:       Result Value   Leukocytes, UA MODERATE (*)    All other components  within normal limits  URINE MICROSCOPIC-ADD ON - Abnormal; Notable for the following:    Squamous Epithelial / LPF 0-5 (*)    Bacteria, UA RARE (*)    All other components within normal limits    EKG  EKG Interpretation None       Radiology Dg Lumbar Spine Complete  Result Date: 06/30/2016 CLINICAL DATA:  82 year old female with lumbar back pain for 1 week with no known injury. Pain radiating to the right leg with some loss of bladder control. Initial encounter. EXAM: LUMBAR SPINE - COMPLETE 4+ VIEW COMPARISON:  Lumbar MRI 04/01/2008. Lumbar radiographs 03/26/2008. Chest CT 04/21/2016. FINDINGS: Calcified, lamellated gallstones re- demonstrated, up to 23 mm individually. Aortoiliac calcified atherosclerosis noted. Normal lumbar segmentation. Chronic lower lumbar disc space loss and endplate spurring. Stable lumbar vertebral height and alignment since 2009. Stable mild T9 superior endplate compression since May. Visualized lower thoracic levels appear stable. No pars fracture. Grossly intact sacrum. SI joints within normal limits. Other visible pelvic structures appear intact. IMPRESSION: 1. No acute osseous abnormality identified in the lumbar spine. Chronically advanced lower lumbar disc degeneration. 2. Mild T9 compression fracture appears stable since May. 3. Cholelithiasis. 4.  Calcified aortic atherosclerosis. Electronically Signed   By: Genevie Ann M.D.   On: 06/30/2016 18:51    Procedures Procedures (including critical care time)  Medications Ordered in ED Medications  morphine 4 MG/ML injection 4 mg (not administered)     Initial Impression / Assessment and Plan / ED Course  I have reviewed the triage vital signs and the nursing notes.  Pertinent labs & imaging results that were available during my care of the patient were reviewed by me and considered in my medical decision making (see chart for details).  Will obtain lumbar xray to r/o compression fx. If negative obtain MRI.  Morphine prn for pain  Clinical Course   82 year old female presents with back pain. X-rays are negative for acute bony pathology. Will obtain MRI to definitively r/o any emergent abnormalities. Morphine given for pain. Shared visit with Dr. Jeneen Rinks. Will sign out patient at shift change to follow up on MRI to G. Olean Ree NP.  Final Clinical Impressions(s) / ED Diagnoses   Final diagnoses:  Low back pain  Left-sided low back pain without sciatica    New Prescriptions New Prescriptions  No medications on file     Recardo Evangelist, PA-C 06/30/16 [place], MD 07/05/16 1501

## 2016-06-30 NOTE — ED Provider Notes (Signed)
Patient seen and evaluated. She reports pain in her back after a fall. Has occasional stress incontinence. This is persisted and has reported really not changed. No bowel changes. No weakness to the legs. Pain is midline low spine.  She has a normal neurological exam was 0-1 reflexes bilaterally. No clonus. Normal strength. Paraspinal muscular tenderness.  Plain film lumbar spines show no compression. MRI shows some spinal stenosis and foraminal stenosis. No acute herniation, fracture, or metastasis. Plan will be home, anti-inflammatories, muscle relaxant, pain medications. Family is able to stay with her. I warned them of the sedative effects of hydrocodone and potentially Robaxin. Someone will be able to be with her as needed.     Tanna Furry, MD 06/30/16 2223

## 2016-06-30 NOTE — ED Notes (Signed)
Pt transported to MRI 

## 2016-06-30 NOTE — ED Notes (Signed)
Pt transported to xray 

## 2016-06-30 NOTE — Discharge Instructions (Addendum)
Rest. Avoid upright activity for the next 2 days.  Increase your activity slowly as her symptoms improved.

## 2016-06-30 NOTE — ED Notes (Signed)
Pt verbalized understanding of d/c instructions and has no further questions. Pt stable and NAD. Pt d/c home with son driving.  

## 2016-06-30 NOTE — ED Triage Notes (Signed)
Pt complaining of sharp pain in mid low back x 1 week. States radiates to R leg. Pt states some loss of bladder control. Seen by UC, sent here for follow up.

## 2016-06-30 NOTE — ED Notes (Signed)
PA in room

## 2016-08-14 ENCOUNTER — Other Ambulatory Visit: Payer: Self-pay | Admitting: Internal Medicine

## 2016-08-15 NOTE — Telephone Encounter (Signed)
Rx has been sent to the pharmacy electronically. ° °

## 2016-08-23 ENCOUNTER — Other Ambulatory Visit: Payer: Self-pay | Admitting: Internal Medicine

## 2016-08-30 ENCOUNTER — Other Ambulatory Visit: Payer: Self-pay | Admitting: Internal Medicine

## 2016-09-09 ENCOUNTER — Emergency Department (HOSPITAL_COMMUNITY): Payer: Medicare Other

## 2016-09-09 ENCOUNTER — Encounter (HOSPITAL_COMMUNITY): Payer: Self-pay | Admitting: *Deleted

## 2016-09-09 ENCOUNTER — Inpatient Hospital Stay (HOSPITAL_COMMUNITY)
Admission: EM | Admit: 2016-09-09 | Discharge: 2016-09-13 | DRG: 083 | Disposition: A | Discharge status: Rehabilitation Facility | Payer: Medicare Other | Attending: General Surgery | Admitting: General Surgery

## 2016-09-09 ENCOUNTER — Inpatient Hospital Stay (HOSPITAL_COMMUNITY): Payer: Medicare Other

## 2016-09-09 DIAGNOSIS — J9600 Acute respiratory failure, unspecified whether with hypoxia or hypercapnia: Secondary | ICD-10-CM

## 2016-09-09 DIAGNOSIS — J8410 Pulmonary fibrosis, unspecified: Secondary | ICD-10-CM | POA: Diagnosis present

## 2016-09-09 DIAGNOSIS — Z881 Allergy status to other antibiotic agents status: Secondary | ICD-10-CM

## 2016-09-09 DIAGNOSIS — S02401S Maxillary fracture, unspecified, sequela: Secondary | ICD-10-CM | POA: Diagnosis not present

## 2016-09-09 DIAGNOSIS — I10 Essential (primary) hypertension: Secondary | ICD-10-CM | POA: Diagnosis present

## 2016-09-09 DIAGNOSIS — G3189 Other specified degenerative diseases of nervous system: Secondary | ICD-10-CM | POA: Diagnosis not present

## 2016-09-09 DIAGNOSIS — Z7982 Long term (current) use of aspirin: Secondary | ICD-10-CM | POA: Diagnosis not present

## 2016-09-09 DIAGNOSIS — E875 Hyperkalemia: Secondary | ICD-10-CM | POA: Diagnosis not present

## 2016-09-09 DIAGNOSIS — Z23 Encounter for immunization: Secondary | ICD-10-CM | POA: Diagnosis not present

## 2016-09-09 DIAGNOSIS — Z955 Presence of coronary angioplasty implant and graft: Secondary | ICD-10-CM

## 2016-09-09 DIAGNOSIS — S02401A Maxillary fracture, unspecified, initial encounter for closed fracture: Secondary | ICD-10-CM

## 2016-09-09 DIAGNOSIS — S065X9A Traumatic subdural hemorrhage with loss of consciousness of unspecified duration, initial encounter: Secondary | ICD-10-CM | POA: Diagnosis present

## 2016-09-09 DIAGNOSIS — W19XXXA Unspecified fall, initial encounter: Secondary | ICD-10-CM | POA: Diagnosis present

## 2016-09-09 DIAGNOSIS — I6200 Nontraumatic subdural hemorrhage, unspecified: Secondary | ICD-10-CM | POA: Diagnosis not present

## 2016-09-09 DIAGNOSIS — F419 Anxiety disorder, unspecified: Secondary | ICD-10-CM | POA: Diagnosis present

## 2016-09-09 DIAGNOSIS — I609 Nontraumatic subarachnoid hemorrhage, unspecified: Secondary | ICD-10-CM | POA: Diagnosis present

## 2016-09-09 DIAGNOSIS — E039 Hypothyroidism, unspecified: Secondary | ICD-10-CM | POA: Diagnosis present

## 2016-09-09 DIAGNOSIS — S065XAA Traumatic subdural hemorrhage with loss of consciousness status unknown, initial encounter: Secondary | ICD-10-CM

## 2016-09-09 DIAGNOSIS — S022XXD Fracture of nasal bones, subsequent encounter for fracture with routine healing: Secondary | ICD-10-CM | POA: Diagnosis not present

## 2016-09-09 DIAGNOSIS — Z885 Allergy status to narcotic agent status: Secondary | ICD-10-CM

## 2016-09-09 DIAGNOSIS — E872 Acidosis: Secondary | ICD-10-CM | POA: Diagnosis present

## 2016-09-09 DIAGNOSIS — Z79899 Other long term (current) drug therapy: Secondary | ICD-10-CM

## 2016-09-09 DIAGNOSIS — I629 Nontraumatic intracranial hemorrhage, unspecified: Secondary | ICD-10-CM | POA: Diagnosis not present

## 2016-09-09 DIAGNOSIS — E785 Hyperlipidemia, unspecified: Secondary | ICD-10-CM | POA: Diagnosis present

## 2016-09-09 DIAGNOSIS — W19XXXS Unspecified fall, sequela: Secondary | ICD-10-CM | POA: Diagnosis not present

## 2016-09-09 DIAGNOSIS — R569 Unspecified convulsions: Secondary | ICD-10-CM | POA: Diagnosis not present

## 2016-09-09 DIAGNOSIS — I2510 Atherosclerotic heart disease of native coronary artery without angina pectoris: Secondary | ICD-10-CM | POA: Diagnosis present

## 2016-09-09 DIAGNOSIS — S022XXA Fracture of nasal bones, initial encounter for closed fracture: Secondary | ICD-10-CM | POA: Diagnosis present

## 2016-09-09 DIAGNOSIS — N390 Urinary tract infection, site not specified: Secondary | ICD-10-CM | POA: Diagnosis not present

## 2016-09-09 DIAGNOSIS — S069X9A Unspecified intracranial injury with loss of consciousness of unspecified duration, initial encounter: Secondary | ICD-10-CM | POA: Diagnosis present

## 2016-09-09 DIAGNOSIS — Z7902 Long term (current) use of antithrombotics/antiplatelets: Secondary | ICD-10-CM

## 2016-09-09 DIAGNOSIS — N179 Acute kidney failure, unspecified: Secondary | ICD-10-CM | POA: Diagnosis not present

## 2016-09-09 DIAGNOSIS — S0240CA Maxillary fracture, right side, initial encounter for closed fracture: Secondary | ICD-10-CM | POA: Diagnosis present

## 2016-09-09 DIAGNOSIS — J9690 Respiratory failure, unspecified, unspecified whether with hypoxia or hypercapnia: Secondary | ICD-10-CM

## 2016-09-09 DIAGNOSIS — Z888 Allergy status to other drugs, medicaments and biological substances status: Secondary | ICD-10-CM

## 2016-09-09 DIAGNOSIS — B9620 Unspecified Escherichia coli [E. coli] as the cause of diseases classified elsewhere: Secondary | ICD-10-CM | POA: Diagnosis not present

## 2016-09-09 DIAGNOSIS — W19XXXD Unspecified fall, subsequent encounter: Secondary | ICD-10-CM | POA: Diagnosis not present

## 2016-09-09 DIAGNOSIS — S066X9D Traumatic subarachnoid hemorrhage with loss of consciousness of unspecified duration, subsequent encounter: Secondary | ICD-10-CM | POA: Diagnosis present

## 2016-09-09 DIAGNOSIS — Z85828 Personal history of other malignant neoplasm of skin: Secondary | ICD-10-CM

## 2016-09-09 DIAGNOSIS — G4700 Insomnia, unspecified: Secondary | ICD-10-CM | POA: Diagnosis not present

## 2016-09-09 DIAGNOSIS — J80 Acute respiratory distress syndrome: Secondary | ICD-10-CM | POA: Diagnosis not present

## 2016-09-09 DIAGNOSIS — I25810 Atherosclerosis of coronary artery bypass graft(s) without angina pectoris: Secondary | ICD-10-CM

## 2016-09-09 DIAGNOSIS — S0292XA Unspecified fracture of facial bones, initial encounter for closed fracture: Secondary | ICD-10-CM | POA: Diagnosis present

## 2016-09-09 DIAGNOSIS — S065X9D Traumatic subdural hemorrhage with loss of consciousness of unspecified duration, subsequent encounter: Secondary | ICD-10-CM | POA: Diagnosis not present

## 2016-09-09 DIAGNOSIS — K219 Gastro-esophageal reflux disease without esophagitis: Secondary | ICD-10-CM | POA: Diagnosis present

## 2016-09-09 DIAGNOSIS — S069X3S Unspecified intracranial injury with loss of consciousness of 1 hour to 5 hours 59 minutes, sequela: Secondary | ICD-10-CM | POA: Diagnosis not present

## 2016-09-09 DIAGNOSIS — I272 Pulmonary hypertension, unspecified: Secondary | ICD-10-CM | POA: Diagnosis present

## 2016-09-09 DIAGNOSIS — S066X9A Traumatic subarachnoid hemorrhage with loss of consciousness of unspecified duration, initial encounter: Secondary | ICD-10-CM | POA: Diagnosis present

## 2016-09-09 DIAGNOSIS — S0240CD Maxillary fracture, right side, subsequent encounter for fracture with routine healing: Secondary | ICD-10-CM | POA: Diagnosis not present

## 2016-09-09 DIAGNOSIS — Z791 Long term (current) use of non-steroidal anti-inflammatories (NSAID): Secondary | ICD-10-CM | POA: Diagnosis not present

## 2016-09-09 DIAGNOSIS — Y9289 Other specified places as the place of occurrence of the external cause: Secondary | ICD-10-CM | POA: Diagnosis not present

## 2016-09-09 DIAGNOSIS — Z961 Presence of intraocular lens: Secondary | ICD-10-CM | POA: Diagnosis present

## 2016-09-09 DIAGNOSIS — S069XAA Unspecified intracranial injury with loss of consciousness status unknown, initial encounter: Secondary | ICD-10-CM | POA: Diagnosis present

## 2016-09-09 DIAGNOSIS — G479 Sleep disorder, unspecified: Secondary | ICD-10-CM | POA: Diagnosis not present

## 2016-09-09 DIAGNOSIS — F09 Unspecified mental disorder due to known physiological condition: Secondary | ICD-10-CM | POA: Diagnosis not present

## 2016-09-09 DIAGNOSIS — Z8249 Family history of ischemic heart disease and other diseases of the circulatory system: Secondary | ICD-10-CM

## 2016-09-09 DIAGNOSIS — Z91040 Latex allergy status: Secondary | ICD-10-CM

## 2016-09-09 DIAGNOSIS — I252 Old myocardial infarction: Secondary | ICD-10-CM | POA: Diagnosis not present

## 2016-09-09 DIAGNOSIS — R0989 Other specified symptoms and signs involving the circulatory and respiratory systems: Secondary | ICD-10-CM | POA: Diagnosis not present

## 2016-09-09 DIAGNOSIS — Z298 Encounter for other specified prophylactic measures: Secondary | ICD-10-CM

## 2016-09-09 DIAGNOSIS — R55 Syncope and collapse: Secondary | ICD-10-CM | POA: Diagnosis not present

## 2016-09-09 LAB — BASIC METABOLIC PANEL
ANION GAP: 10 (ref 5–15)
BUN: 19 mg/dL (ref 6–20)
CHLORIDE: 106 mmol/L (ref 101–111)
CO2: 22 mmol/L (ref 22–32)
Calcium: 9.8 mg/dL (ref 8.9–10.3)
Creatinine, Ser: 0.98 mg/dL (ref 0.44–1.00)
GFR calc non Af Amer: 52 mL/min — ABNORMAL LOW (ref >60)
Glucose, Bld: 95 mg/dL (ref 65–99)
Potassium: 4.4 mmol/L (ref 3.5–5.1)
Sodium: 138 mmol/L (ref 135–145)

## 2016-09-09 LAB — HEPATIC FUNCTION PANEL
ALBUMIN: 3.7 g/dL (ref 3.5–5.0)
ALT: 22 U/L (ref 14–54)
AST: 49 U/L — AB (ref 15–41)
Alkaline Phosphatase: 64 U/L (ref 38–126)
Bilirubin, Direct: 0.2 mg/dL (ref 0.1–0.5)
Indirect Bilirubin: 0.5 mg/dL (ref 0.3–0.9)
Total Bilirubin: 0.7 mg/dL (ref 0.3–1.2)
Total Protein: 6.1 g/dL — ABNORMAL LOW (ref 6.5–8.1)

## 2016-09-09 LAB — PROTIME-INR
INR: 0.95
Prothrombin Time: 12.6 seconds (ref 11.4–15.2)

## 2016-09-09 LAB — CBC WITH DIFFERENTIAL/PLATELET
Basophils Absolute: 0.1 10*3/uL (ref 0.0–0.1)
Basophils Relative: 1 %
EOS ABS: 0.2 10*3/uL (ref 0.0–0.7)
Eosinophils Relative: 3 %
HCT: 43.1 % (ref 36.0–46.0)
HEMOGLOBIN: 14.6 g/dL (ref 12.0–15.0)
LYMPHS ABS: 1.9 10*3/uL (ref 0.7–4.0)
Lymphocytes Relative: 24 %
MCH: 30.5 pg (ref 26.0–34.0)
MCHC: 33.9 g/dL (ref 30.0–36.0)
MCV: 90 fL (ref 78.0–100.0)
MONO ABS: 0.6 10*3/uL (ref 0.1–1.0)
MONOS PCT: 7 %
NEUTROS PCT: 65 %
Neutro Abs: 4.9 10*3/uL (ref 1.7–7.7)
Platelets: 201 10*3/uL (ref 150–400)
RBC: 4.79 MIL/uL (ref 3.87–5.11)
RDW: 13.5 % (ref 11.5–15.5)
WBC: 7.6 10*3/uL (ref 4.0–10.5)

## 2016-09-09 LAB — ABO/RH: ABO/RH(D): A POS

## 2016-09-09 LAB — MRSA PCR SCREENING: MRSA BY PCR: NEGATIVE

## 2016-09-09 LAB — CBG MONITORING, ED: GLUCOSE-CAPILLARY: 97 mg/dL (ref 65–99)

## 2016-09-09 MED ORDER — ATORVASTATIN CALCIUM 10 MG PO TABS
20.0000 mg | ORAL_TABLET | Freq: Every day | ORAL | Status: DC
  Administered 2016-09-09 – 2016-09-13 (×4): 20 mg via ORAL
  Filled 2016-09-09: qty 2
  Filled 2016-09-09 (×3): qty 1

## 2016-09-09 MED ORDER — SALINE SPRAY 0.65 % NA SOLN
4.0000 | NASAL | Status: DC | PRN
  Filled 2016-09-09: qty 44

## 2016-09-09 MED ORDER — SODIUM CHLORIDE 0.9 % IV SOLN
10.0000 mL/h | Freq: Once | INTRAVENOUS | Status: AC
  Administered 2016-09-09: 10 mL/h via INTRAVENOUS

## 2016-09-09 MED ORDER — ONDANSETRON HCL 4 MG/2ML IJ SOLN: 4.0000 mg | Freq: Four times a day (QID) | INTRAMUSCULAR | Status: DC | PRN

## 2016-09-09 MED ORDER — CEFAZOLIN IN D5W 1 GM/50ML IV SOLN
1.0000 g | Freq: Once | INTRAVENOUS | Status: AC
  Administered 2016-09-09: 1 g via INTRAVENOUS
  Filled 2016-09-09: qty 50

## 2016-09-09 MED ORDER — NITROGLYCERIN 0.4 MG SL SUBL: 0.4000 mg | SUBLINGUAL_TABLET | SUBLINGUAL | Status: DC | PRN

## 2016-09-09 MED ORDER — ONDANSETRON HCL 4 MG PO TABS: 4.0000 mg | ORAL_TABLET | Freq: Four times a day (QID) | ORAL | Status: DC | PRN

## 2016-09-09 MED ORDER — BACITRACIN ZINC 500 UNIT/GM EX OINT
TOPICAL_OINTMENT | Freq: Two times a day (BID) | CUTANEOUS | Status: DC
  Administered 2016-09-09 – 2016-09-10 (×3): via TOPICAL
  Administered 2016-09-11: 1 via TOPICAL
  Administered 2016-09-11: 21:00:00 via TOPICAL
  Administered 2016-09-12 (×2): 1 via TOPICAL
  Administered 2016-09-13: 10:00:00 via TOPICAL
  Filled 2016-09-09 (×3): qty 28.35

## 2016-09-09 MED ORDER — LEVOTHYROXINE SODIUM 100 MCG PO TABS
100.0000 ug | ORAL_TABLET | Freq: Every day | ORAL | Status: DC
  Administered 2016-09-10 – 2016-09-13 (×3): 100 ug via ORAL
  Filled 2016-09-09 (×2): qty 1

## 2016-09-09 MED ORDER — PANTOPRAZOLE SODIUM 40 MG PO TBEC
40.0000 mg | DELAYED_RELEASE_TABLET | Freq: Every day | ORAL | Status: DC
  Administered 2016-09-09 – 2016-09-13 (×4): 40 mg via ORAL
  Filled 2016-09-09 (×4): qty 1

## 2016-09-09 MED ORDER — CYCLOBENZAPRINE HCL 10 MG PO TABS
5.0000 mg | ORAL_TABLET | Freq: Two times a day (BID) | ORAL | Status: DC | PRN
  Administered 2016-09-10: 5 mg via ORAL
  Filled 2016-09-09: qty 1

## 2016-09-09 MED ORDER — POTASSIUM CHLORIDE IN NACL 20-0.9 MEQ/L-% IV SOLN
INTRAVENOUS | Status: DC
  Administered 2016-09-09: 21:00:00 via INTRAVENOUS
  Filled 2016-09-09: qty 1000

## 2016-09-09 MED ORDER — MORPHINE SULFATE (PF) 2 MG/ML IV SOLN: 1.0000 mg | INTRAVENOUS | Status: DC | PRN

## 2016-09-09 MED ORDER — EZETIMIBE 10 MG PO TABS
5.0000 mg | ORAL_TABLET | Freq: Every day | ORAL | Status: DC
  Administered 2016-09-09 – 2016-09-13 (×4): 5 mg via ORAL
  Filled 2016-09-09 (×4): qty 1

## 2016-09-09 MED ORDER — MORPHINE SULFATE (PF) 2 MG/ML IV SOLN
2.0000 mg | Freq: Once | INTRAVENOUS | Status: AC
  Administered 2016-09-09: 2 mg via INTRAVENOUS
  Filled 2016-09-09: qty 1

## 2016-09-09 MED ORDER — METOPROLOL TARTRATE 12.5 MG HALF TABLET
12.5000 mg | ORAL_TABLET | Freq: Two times a day (BID) | ORAL | Status: DC
  Administered 2016-09-09 – 2016-09-13 (×7): 12.5 mg via ORAL
  Filled 2016-09-09 (×7): qty 1

## 2016-09-09 MED ORDER — ACETAMINOPHEN 325 MG PO TABS
650.0000 mg | ORAL_TABLET | ORAL | Status: DC | PRN
  Administered 2016-09-11: 650 mg via ORAL
  Filled 2016-09-09: qty 2

## 2016-09-09 MED ORDER — DOCUSATE SODIUM 100 MG PO CAPS
100.0000 mg | ORAL_CAPSULE | Freq: Two times a day (BID) | ORAL | Status: DC
  Administered 2016-09-09 – 2016-09-13 (×7): 100 mg via ORAL
  Filled 2016-09-09 (×7): qty 1

## 2016-09-09 MED ORDER — TETANUS-DIPHTH-ACELL PERTUSSIS 5-2.5-18.5 LF-MCG/0.5 IM SUSP
0.5000 mL | Freq: Once | INTRAMUSCULAR | Status: AC
  Administered 2016-09-09: 0.5 mL via INTRAMUSCULAR
  Filled 2016-09-09: qty 0.5

## 2016-09-09 MED ORDER — LISINOPRIL 10 MG PO TABS
10.0000 mg | ORAL_TABLET | Freq: Every day | ORAL | Status: DC
  Administered 2016-09-10 – 2016-09-13 (×3): 10 mg via ORAL
  Filled 2016-09-09 (×5): qty 1

## 2016-09-09 MED ORDER — BACITRACIN ZINC 500 UNIT/GM EX OINT
1.0000 "application " | TOPICAL_OINTMENT | Freq: Once | CUTANEOUS | Status: AC
  Administered 2016-09-09: 1 via TOPICAL
  Filled 2016-09-09: qty 0.9

## 2016-09-09 NOTE — Consult Note (Signed)
Case discussed, images reviewed.  Admit to trauma on 31M.  Two units of platelets.  I'll order a repeat scan.  Consult to follow.

## 2016-09-09 NOTE — ED Triage Notes (Signed)
Patient came in by [facility] post fall. Patient seemed to have syncopal episode witnessed by friend. Patient has head trauma. Left eye swelling and bruising, unable to open left eye. Patient takes blood thinners. Alert and oriented to self and place. Confused on time and situation. Repeating questions. Blood in nose and mouth. Dr. Wyvonnia Dusky aware and at bedside to assess pt.

## 2016-09-09 NOTE — Consult Note (Signed)
ENT/FACIAL TRAUMA CONSULT:  Reason for Consult: Right facial fractures Referring Physician: Trauma Service  Kristin Shaffer is an 82 y.o. female.  HPI: The patient is admitted to the [facility] emergency department after suffering an acute syncopal episode earlier today. She fell and struck her right face, no memory of the injury. Evaluation in the emergency department shows extensive right periorbital edema and swelling. CT scan performed shows subdural hematoma and intracranial bleeding and right maxillary and paranasal fractures.  Past Medical History:  Diagnosis Date  . Anxiety   . Arthritis    "different places; not bad" (03/20/2013)  . Coronary artery disease   . Exertional shortness of breath   . GERD (gastroesophageal reflux disease)   . High cholesterol   . Hypertension   . Hypothyroidism   . Myocardial infarction    "Dr's saw evidence I might have had a heart attack" (03/20/2013)  . NSTEMI (non-ST elevated myocardial infarction) ([place]) 03/20/2013   cath - mid RCA  . Pneumonia 2012  . Skin cancer    "both legs; right arm" (03/20/2013)    Past Surgical History:  Procedure Laterality Date  . APPENDECTOMY  2003  . BREAST BIOPSY Bilateral 1990s   "total of 5; 2 on one side, 3 on the other; all benign" (03/20/2013)  . CATARACT EXTRACTION W/ INTRAOCULAR LENS  IMPLANT, BILATERAL Bilateral ~ 2008  . CORONARY ANGIOPLASTY  12/08/2013  . CORONARY ANGIOPLASTY WITH STENT PLACEMENT  03/20/2013   NSTEMI - subtotal occlusion of mid RCA - PCI of mid RCA of long tubular 40-60% lesion - 2 tandem 95-99% subtotal occlusions - 3 overlapping Xience Xpedition DES (Dr. Roni Bread)   . [place] OF UTERUS  1956?  Marland Kitchen LEFT HEART CATHETERIZATION WITH CORONARY ANGIOGRAM N/A 03/20/2013   Procedure: LEFT HEART CATHETERIZATION WITH CORONARY ANGIOGRAM;  Surgeon: Sanda Klein, MD;  Location: [place] CATH LAB;  Service: Cardiovascular;  Laterality: N/A;  . LEFT HEART CATHETERIZATION WITH  CORONARY ANGIOGRAM N/A 12/08/2013   Procedure: LEFT HEART CATHETERIZATION WITH CORONARY ANGIOGRAM;  Surgeon: Blane Ohara, MD;  [facility] C[facility] LAB;  Service: Cardiovascular;  Laterality: N/A;  . SKIN CANCER EXCISION     "1 off right arm; 2 off each leg" (03/20/2013)  . TRANSTHORACIC ECHOCARDIOGRAM  03/2013   EF 61-60%, grade 1 diastolic dysfunction; mild MR; calcifed MV annulus; LA mildly dilated    Family History  Problem Relation Age of Onset  . Dementia Mother   . Lung cancer Father   . CAD Father 43  . Cancer Brother     Social History:  reports that she has never smoked. She has never used smokeless tobacco. She reports that she does not drink alcohol or use drugs.  Allergies:  Allergies  Allergen Reactions  . Atorvastatin Other (See Comments)    Leg weakness and cramps  . Hydrocodone-Acetaminophen Nausea And Vomiting  . Omeprazole Other (See Comments)    Made acid reflux worse  . Zocor [Simvastatin] Other (See Comments)    Trouble Walking, Leg weakness and cramping  . Latex Hives and Rash    Medications: I have reviewed the patient's current medications.  Results for orders placed or performed during the hospital encounter of 09/09/16 (from the past 48 hour(s))  ABO/Rh     Status: None   Collection Time: 09/09/16 12:00 PM  Result Value Ref Range   ABO/RH(D) A POS   CBC with Differential/Platelet     Status: None   Collection Time: 09/09/16  12:30 PM  Result Value Ref Range   WBC 7.6 4.0 - 10.5 K/uL   RBC 4.79 3.87 - 5.11 MIL/uL   Hemoglobin 14.6 12.0 - 15.0 g/dL   HCT 43.1 36.0 - 46.0 %   MCV 90.0 78.0 - 100.0 fL   MCH 30.5 26.0 - 34.0 pg   MCHC 33.9 30.0 - 36.0 g/dL   RDW 13.5 11.5 - 15.5 %   Platelets 201 150 - 400 K/uL   Neutrophils Relative % 65 %   Neutro Abs 4.9 1.7 - 7.7 K/uL   Lymphocytes Relative 24 %   Lymphs Abs 1.9 0.7 - 4.0 K/uL   Monocytes Relative 7 %   Monocytes Absolute 0.6 0.1 - 1.0 K/uL   Eosinophils Relative 3 %   Eosinophils  Absolute 0.2 0.0 - 0.7 K/uL   Basophils Relative 1 %   Basophils Absolute 0.1 0.0 - 0.1 K/uL  Basic metabolic panel     Status: Abnormal   Collection Time: 09/09/16 12:30 PM  Result Value Ref Range   Sodium 138 135 - 145 mmol/L   Potassium 4.4 3.5 - 5.1 mmol/L   Chloride 106 101 - 111 mmol/L   CO2 22 22 - 32 mmol/L   Glucose, Bld 95 65 - 99 mg/dL   BUN 19 6 - 20 mg/dL   Creatinine, Ser 0.98 0.44 - 1.00 mg/dL   Calcium 9.8 8.9 - 10.3 mg/dL   GFR calc non Af Amer 52 (L) >60 mL/min   GFR calc Af Amer >60 >60 mL/min    Comment: (NOTE) The eGFR has been calculated using the CKD EPI equation. This calculation has not been validated in all clinical situations. eGFR's persistently <60 mL/min signify possible Chronic Kidney Disease.    Anion gap 10 5 - 15  Protime-INR     Status: None   Collection Time: 09/09/16 12:30 PM  Result Value Ref Range   Prothrombin Time 12.6 11.4 - 15.2 seconds   INR 0.95   CBG monitoring, ED     Status: None   Collection Time: 09/09/16 12:34 PM  Result Value Ref Range   Glucose-Capillary 97 65 - 99 mg/dL  Prepare Pheresed Platelets     Status: None (Preliminary result)   Collection Time: 09/09/16  3:05 PM  Result Value Ref Range   Unit Number [id number]    Blood Component Type PLTP LR1 PAS    Unit division 00    Status of Unit ISSUED    Transfusion Status OK TO TRANSFUSE    Unit Number [id number]    Blood Component Type PLTPHER LR1    Unit division 00    Status of Unit ALLOCATED    Transfusion Status OK TO TRANSFUSE     Ct Head Wo Contrast  Result Date: 09/09/2016 CLINICAL DATA:  Syncopal episode. Trauma to head. Right periorbital hematoma. Laceration to the right side of the face and head, and upper lip EXAM: CT HEAD WITHOUT CONTRAST CT MAXILLOFACIAL WITHOUT CONTRAST CT CERVICAL SPINE WITHOUT CONTRAST TECHNIQUE: Multidetector CT imaging of the head, cervical spine, and maxillofacial structures were performed using the standard protocol  without intravenous contrast. Multiplanar CT image reconstructions of the cervical spine and maxillofacial structures were also generated. COMPARISON:  None. FINDINGS: CT HEAD FINDINGS Brain: Extensive subarachnoid hemorrhage is present within the right sylvian fissure an over the right convexity. There is hemorrhagic contusion involving the right temporal tip and anterior inferior right frontal lobe along the anterior cranial fossa. Extensive subdural hematoma is present  along the left side of the falx cerebri. Additional subarachnoid hemorrhage is present over the high left frontal convexity. Additional areas of subarachnoid hemorrhage are present over the lateral left temporal lobe. Vascular: No focal hyperdense vessel is present. Skull: There is a frontal process fracture of the right maxilla. The calvarium is otherwise intact. Right zygomatic arch is intact. Please see face CT for further detail of right maxillary sinus fracture. Other: Extensive right periorbital hematoma is noted. CT MAXILLOFACIAL FINDINGS Osseous: A comminuted anterior fractures present in the right maxillary sinus. The right maxillary sinus is filled with blood. The frontal process the right maxillary sinus is displaced medially 2 mm. There is also a comminuted distal right nasal bone fracture. Right zygomatic arch is intact. The mandible is intact and located. Orbits: The globes are intact bilaterally. Extensive right periorbital hematoma is present. No other underlying fracture is evident. Sinuses: Hemorrhage fills the right maxillary sinus. The remaining paranasal sinuses and the mastoid air cells are clear. Soft tissues: Extensive hemorrhages soft tissue swelling is present over the right side of the face. This continues to the level the mandible. CT CERVICAL SPINE FINDINGS Alignment: Slight anterolisthesis is present at C4-5. AP alignment is otherwise anatomic. Skull base and vertebrae: The craniocervical junction is intact. Soft  tissues and spinal canal: The soft tissues the neck demonstrate bilateral carotid bifurcation atherosclerotic disease. The thyroid is atrophic. No significant adenopathy is present. No focal soft tissue injury is present. There is fatty infiltration of the parotid glands bilaterally. Disc levels: Chronic endplate degenerate change and loss of disc height is most evident at C5-6. Uncovertebral spurring contributes to moderate foraminal stenosis bilaterally at this level. Multilevel advanced facet hypertrophy is present. There is fusion across the facet joints at C3-4 and C4-5 bilaterally. Moderate facet degenerative changes are present at C2-3, left greater than right. Upper chest: The lung apices are clear. Atherosclerotic changes are present at the aortic arch without significant aneurysm or stenosis. IMPRESSION: 1. Extensive subarachnoid hemorrhage throughout the right sylvian fissure in lateral left temporal lobe. 2. Hemorrhagic contusion involving the anterior right temporal tip and anterior frontal lobes, right greater than left. 3. Subdural blood along the left side of the falx cerebri. 4. Large right periorbital hematoma with extensive inflammatory changes extending into the right side of the face. 5. Comminuted anterior maxillary sinus fracture with blood throughout the right maxillary sinus. 6. Minimally displaced frontal process maxillary fracture. 7. Minimally displaced anterior right nasal bone fractures. 8. The mandible is intact. 9. No acute abnormality of the cervical spine. 10. Degenerative changes of the cervical spine are most pronounced at C5-6. These results were called by telephone at the time of interpretation on 09/09/2016 at 2:47 pm to Dr. Wyvonnia Dusky , who verbally acknowledged these results. Electronically Signed   By: San Morelle M.D.   On: 09/09/2016 14:48   Ct Cervical Spine Wo Contrast  Result Date: 09/09/2016 CLINICAL DATA:  Syncopal episode. Trauma to head. Right periorbital  hematoma. Laceration to the right side of the face and head, and upper lip EXAM: CT HEAD WITHOUT CONTRAST CT MAXILLOFACIAL WITHOUT CONTRAST CT CERVICAL SPINE WITHOUT CONTRAST TECHNIQUE: Multidetector CT imaging of the head, cervical spine, and maxillofacial structures were performed using the standard protocol without intravenous contrast. Multiplanar CT image reconstructions of the cervical spine and maxillofacial structures were also generated. COMPARISON:  None. FINDINGS: CT HEAD FINDINGS Brain: Extensive subarachnoid hemorrhage is present within the right sylvian fissure an over the right convexity. There is hemorrhagic  contusion involving the right temporal tip and anterior inferior right frontal lobe along the anterior cranial fossa. Extensive subdural hematoma is present along the left side of the falx cerebri. Additional subarachnoid hemorrhage is present over the high left frontal convexity. Additional areas of subarachnoid hemorrhage are present over the lateral left temporal lobe. Vascular: No focal hyperdense vessel is present. Skull: There is a frontal process fracture of the right maxilla. The calvarium is otherwise intact. Right zygomatic arch is intact. Please see face CT for further detail of right maxillary sinus fracture. Other: Extensive right periorbital hematoma is noted. CT MAXILLOFACIAL FINDINGS Osseous: A comminuted anterior fractures present in the right maxillary sinus. The right maxillary sinus is filled with blood. The frontal process the right maxillary sinus is displaced medially 2 mm. There is also a comminuted distal right nasal bone fracture. Right zygomatic arch is intact. The mandible is intact and located. Orbits: The globes are intact bilaterally. Extensive right periorbital hematoma is present. No other underlying fracture is evident. Sinuses: Hemorrhage fills the right maxillary sinus. The remaining paranasal sinuses and the mastoid air cells are clear. Soft tissues: Extensive  hemorrhages soft tissue swelling is present over the right side of the face. This continues to the level the mandible. CT CERVICAL SPINE FINDINGS Alignment: Slight anterolisthesis is present at C4-5. AP alignment is otherwise anatomic. Skull base and vertebrae: The craniocervical junction is intact. Soft tissues and spinal canal: The soft tissues the neck demonstrate bilateral carotid bifurcation atherosclerotic disease. The thyroid is atrophic. No significant adenopathy is present. No focal soft tissue injury is present. There is fatty infiltration of the parotid glands bilaterally. Disc levels: Chronic endplate degenerate change and loss of disc height is most evident at C5-6. Uncovertebral spurring contributes to moderate foraminal stenosis bilaterally at this level. Multilevel advanced facet hypertrophy is present. There is fusion across the facet joints at C3-4 and C4-5 bilaterally. Moderate facet degenerative changes are present at C2-3, left greater than right. Upper chest: The lung apices are clear. Atherosclerotic changes are present at the aortic arch without significant aneurysm or stenosis. IMPRESSION: 1. Extensive subarachnoid hemorrhage throughout the right sylvian fissure in lateral left temporal lobe. 2. Hemorrhagic contusion involving the anterior right temporal tip and anterior frontal lobes, right greater than left. 3. Subdural blood along the left side of the falx cerebri. 4. Large right periorbital hematoma with extensive inflammatory changes extending into the right side of the face. 5. Comminuted anterior maxillary sinus fracture with blood throughout the right maxillary sinus. 6. Minimally displaced frontal process maxillary fracture. 7. Minimally displaced anterior right nasal bone fractures. 8. The mandible is intact. 9. No acute abnormality of the cervical spine. 10. Degenerative changes of the cervical spine are most pronounced at C5-6. These results were called by telephone at the time  of interpretation on 09/09/2016 at 2:47 pm to Dr. Wyvonnia Dusky , who verbally acknowledged these results. Electronically Signed   By: San Morelle M.D.   On: 09/09/2016 14:48   Ct Maxillofacial Wo Contrast  Result Date: 09/09/2016 CLINICAL DATA:  Syncopal episode. Trauma to head. Right periorbital hematoma. Laceration to the right side of the face and head, and upper lip EXAM: CT HEAD WITHOUT CONTRAST CT MAXILLOFACIAL WITHOUT CONTRAST CT CERVICAL SPINE WITHOUT CONTRAST TECHNIQUE: Multidetector CT imaging of the head, cervical spine, and maxillofacial structures were performed using the standard protocol without intravenous contrast. Multiplanar CT image reconstructions of the cervical spine and maxillofacial structures were also generated. COMPARISON:  None. FINDINGS: CT  HEAD FINDINGS Brain: Extensive subarachnoid hemorrhage is present within the right sylvian fissure an over the right convexity. There is hemorrhagic contusion involving the right temporal tip and anterior inferior right frontal lobe along the anterior cranial fossa. Extensive subdural hematoma is present along the left side of the falx cerebri. Additional subarachnoid hemorrhage is present over the high left frontal convexity. Additional areas of subarachnoid hemorrhage are present over the lateral left temporal lobe. Vascular: No focal hyperdense vessel is present. Skull: There is a frontal process fracture of the right maxilla. The calvarium is otherwise intact. Right zygomatic arch is intact. Please see face CT for further detail of right maxillary sinus fracture. Other: Extensive right periorbital hematoma is noted. CT MAXILLOFACIAL FINDINGS Osseous: A comminuted anterior fractures present in the right maxillary sinus. The right maxillary sinus is filled with blood. The frontal process the right maxillary sinus is displaced medially 2 mm. There is also a comminuted distal right nasal bone fracture. Right zygomatic arch is intact. The  mandible is intact and located. Orbits: The globes are intact bilaterally. Extensive right periorbital hematoma is present. No other underlying fracture is evident. Sinuses: Hemorrhage fills the right maxillary sinus. The remaining paranasal sinuses and the mastoid air cells are clear. Soft tissues: Extensive hemorrhages soft tissue swelling is present over the right side of the face. This continues to the level the mandible. CT CERVICAL SPINE FINDINGS Alignment: Slight anterolisthesis is present at C4-5. AP alignment is otherwise anatomic. Skull base and vertebrae: The craniocervical junction is intact. Soft tissues and spinal canal: The soft tissues the neck demonstrate bilateral carotid bifurcation atherosclerotic disease. The thyroid is atrophic. No significant adenopathy is present. No focal soft tissue injury is present. There is fatty infiltration of the parotid glands bilaterally. Disc levels: Chronic endplate degenerate change and loss of disc height is most evident at C5-6. Uncovertebral spurring contributes to moderate foraminal stenosis bilaterally at this level. Multilevel advanced facet hypertrophy is present. There is fusion across the facet joints at C3-4 and C4-5 bilaterally. Moderate facet degenerative changes are present at C2-3, left greater than right. Upper chest: The lung apices are clear. Atherosclerotic changes are present at the aortic arch without significant aneurysm or stenosis. IMPRESSION: 1. Extensive subarachnoid hemorrhage throughout the right sylvian fissure in lateral left temporal lobe. 2. Hemorrhagic contusion involving the anterior right temporal tip and anterior frontal lobes, right greater than left. 3. Subdural blood along the left side of the falx cerebri. 4. Large right periorbital hematoma with extensive inflammatory changes extending into the right side of the face. 5. Comminuted anterior maxillary sinus fracture with blood throughout the right maxillary sinus. 6.  Minimally displaced frontal process maxillary fracture. 7. Minimally displaced anterior right nasal bone fractures. 8. The mandible is intact. 9. No acute abnormality of the cervical spine. 10. Degenerative changes of the cervical spine are most pronounced at C5-6. These results were called by telephone at the time of interpretation on 09/09/2016 at 2:47 pm to Dr. Wyvonnia Dusky , who verbally acknowledged these results. Electronically Signed   By: San Morelle M.D.   On: 09/09/2016 14:48    ROS:ROS 12 systems reviewed and negative except as stated in HPI   Blood pressure (!) 140/47, pulse 72, temperature 97.7 F (36.5 C), temperature source Oral, resp. rate 21, height '5\' 6"'  (1.676 m), weight 81.2 kg (179 lb), SpO2 96 %.  PHYSICAL EXAM: General appearance - alert, well appearing, and in no distress Mental status - alert, oriented to person, place,  and time, possible short-term memory loss. Eyes - left eye shows normal vision, pupil response and extraocular mobility. Unable to assess right eye secondary to severe periorbital edema and ecchymosis. EDP evaluation on initial presentation showed normal vision and pupillary response. Nose - patent anterior nasal passageway, moderate bloody crusting. The patient has a septal deviation without septal hematoma. Moderate edema of the right nasal wall. Mouth - mucous membranes moist, pharynx normal without lesions and Blood in the posterior oropharynx, no intraoral lacerations.  Face-extensive right periorbital edema and ecchymosis, no trismus or malocclusion  Studies Reviewed: Maxillofacial CT scan  Assessment/Plan: The patient is admitted to [facility] on the trauma service for evaluation of multisystem injuries. She had an acute syncopal episode, etiology unclear. She has significant subarachnoid hemorrhage and intracranial contusions. Close monitoring by neurosurgery with serial CT scan. Facial injuries include soft tissue trauma involving the  right periorbital area with small superficial lacerations and minimally displaced right paranasal and maxillary fractures.  The patient has significant posttraumatic swelling, unable to adequately clinically assess her fractures. Discussed these findings with her family, recommend fracture precautions as outlined below and simple wound care to the right lateral orbital region including antibiotic ointment. Elevated head of bed and ice compress to that area. We will follow during her hospitalization but plan follow-up as an outpatient in 7-10 days to reassess her fractures. Based on her CT findings I doubt this patient will require surgical intervention.  Fracture precautions: 1. Elevate head of bed 2. Ice compress to periorbital region 3. Avoid additional trauma, nose blowing or sneezing 4. Liquid and soft diet as tolerated 5. Saline nasal spray 4 times a day and when necessary   Indiyah Paone 09/09/2016, 5:38 PM

## 2016-09-09 NOTE — ED Notes (Signed)
Dr Wilson at bedside.

## 2016-09-09 NOTE — H&P (Signed)
History   Kristin Shaffer is an 82 y.o. female.   Chief Complaint: fall Chief Complaint  Patient presents with  . Fall  . Head Injury  . Facial Swelling    HPI 82 year old Caucasian female with coronary artery disease, drug-eluting stents, anticoagulated, hypertension, hypothyroidism, was at church bagging potatoes when apparently she suffered a syncopal episode. Patient does not remember the event. The next thing she remembers was waking up on the ambulance. She fell and landed on her right face. She states that she has been feeling well the past couple days. She reports compliance with her medications. She does have some mild chronic dyspnea on exertion. She has reported pulmonary fibrosis however good pupillary function tests. Workup in the emergency department showed extensive right periorbital facial swelling and contusion with subarachnoid hemorrhage, subdural hematoma and intraparenchymal hemorrhage. We were asked to admit.  She denies chest pain, chest pressure, acute back pain, neck pain, abdominal pain, extremity pain. She does have some chronic back pain. Past Medical History:  Diagnosis Date  . Anxiety   . Arthritis    "different places; not bad" (03/20/2013)  . Coronary artery disease   . Exertional shortness of breath   . GERD (gastroesophageal reflux disease)   . High cholesterol   . Hypertension   . Hypothyroidism   . Myocardial infarction    "Dr's saw evidence I might have had a heart attack" (03/20/2013)  . NSTEMI (non-ST elevated myocardial infarction) ([place]) 03/20/2013   cath - mid RCA  . Pneumonia 2012  . Skin cancer    "both legs; right arm" (03/20/2013)    Past Surgical History:  Procedure Laterality Date  . APPENDECTOMY  2003  . BREAST BIOPSY Bilateral 1990s   "total of 5; 2 on one side, 3 on the other; all benign" (03/20/2013)  . CATARACT EXTRACTION W/ INTRAOCULAR LENS  IMPLANT, BILATERAL Bilateral ~ 2008  . CORONARY ANGIOPLASTY  12/08/2013  . CORONARY  ANGIOPLASTY WITH STENT PLACEMENT  03/20/2013   NSTEMI - subtotal occlusion of mid RCA - PCI of mid RCA of long tubular 40-60% lesion - 2 tandem 95-99% subtotal occlusions - 3 overlapping Xience Xpedition DES (Dr. Roni Bread)   . [place] OF UTERUS  1956?  Marland Kitchen LEFT HEART CATHETERIZATION WITH CORONARY ANGIOGRAM N/A 03/20/2013   Procedure: LEFT HEART CATHETERIZATION WITH CORONARY ANGIOGRAM;  Surgeon: Sanda Klein, MD;  Location: [facility] CATH LAB;  Service: Cardiovascular;  Laterality: N/A;  . LEFT HEART CATHETERIZATION WITH CORONARY ANGIOGRAM N/A 12/08/2013   Procedure: LEFT HEART CATHETERIZATION WITH CORONARY ANGIOGRAM;  Surgeon: Blane Ohara, MD;  Location: [facility] CATH LAB;  Service: Cardiovascular;  Laterality: N/A;  . SKIN CANCER EXCISION     "1 off right arm; 2 off each leg" (03/20/2013)  . TRANSTHORACIC ECHOCARDIOGRAM  03/2013   EF 38-46%, grade 1 diastolic dysfunction; mild MR; calcifed MV annulus; LA mildly dilated    Family History  Problem Relation Age of Onset  . Dementia Mother   . Lung cancer Father   . CAD Father 26  . Cancer Brother    Social History:  reports that she has never smoked. She has never used smokeless tobacco. She reports that she does not drink alcohol or use drugs.  Allergies   Allergies  Allergen Reactions  . Atorvastatin Other (See Comments)    Leg weakness and cramps  . Hydrocodone-Acetaminophen Nausea And Vomiting  . Omeprazole Other (See Comments)    Made acid reflux worse  . Zocor [Simvastatin]  Other (See Comments)    Trouble Walking, Leg weakness and cramping  . Latex Hives and Rash    Home Medications   (Not in a hospital admission)  Trauma Course   Results for orders placed or performed during the hospital encounter of 09/09/16 (from the past 48 hour(s))  ABO/Rh     Status: None   Collection Time: 09/09/16 12:00 PM  Result Value Ref Range   ABO/RH(D) A POS   CBC with Differential/Platelet     Status: None   Collection Time:  09/09/16 12:30 PM  Result Value Ref Range   WBC 7.6 4.0 - 10.5 K/uL   RBC 4.79 3.87 - 5.11 MIL/uL   Hemoglobin 14.6 12.0 - 15.0 g/dL   HCT 43.1 36.0 - 46.0 %   MCV 90.0 78.0 - 100.0 fL   MCH 30.5 26.0 - 34.0 pg   MCHC 33.9 30.0 - 36.0 g/dL   RDW 13.5 11.5 - 15.5 %   Platelets 201 150 - 400 K/uL   Neutrophils Relative % 65 %   Neutro Abs 4.9 1.7 - 7.7 K/uL   Lymphocytes Relative 24 %   Lymphs Abs 1.9 0.7 - 4.0 K/uL   Monocytes Relative 7 %   Monocytes Absolute 0.6 0.1 - 1.0 K/uL   Eosinophils Relative 3 %   Eosinophils Absolute 0.2 0.0 - 0.7 K/uL   Basophils Relative 1 %   Basophils Absolute 0.1 0.0 - 0.1 K/uL  Basic metabolic panel     Status: Abnormal   Collection Time: 09/09/16 12:30 PM  Result Value Ref Range   Sodium 138 135 - 145 mmol/L   Potassium 4.4 3.5 - 5.1 mmol/L   Chloride 106 101 - 111 mmol/L   CO2 22 22 - 32 mmol/L   Glucose, Bld 95 65 - 99 mg/dL   BUN 19 6 - 20 mg/dL   Creatinine, Ser 0.98 0.44 - 1.00 mg/dL   Calcium 9.8 8.9 - 10.3 mg/dL   GFR calc non Af Amer 52 (L) >60 mL/min   GFR calc Af Amer >60 >60 mL/min    Comment: (NOTE) The eGFR has been calculated using the CKD EPI equation. This calculation has not been validated in all clinical situations. eGFR's persistently <60 mL/min signify possible Chronic Kidney Disease.    Anion gap 10 5 - 15  Protime-INR     Status: None   Collection Time: 09/09/16 12:30 PM  Result Value Ref Range   Prothrombin Time 12.6 11.4 - 15.2 seconds   INR 0.95   CBG monitoring, ED     Status: None   Collection Time: 09/09/16 12:34 PM  Result Value Ref Range   Glucose-Capillary 97 65 - 99 mg/dL  Prepare Pheresed Platelets     Status: None (Preliminary result)   Collection Time: 09/09/16  3:05 PM  Result Value Ref Range   Unit Number [id number]    Blood Component Type PLTP LR1 PAS    Unit division 00    Status of Unit ISSUED    Transfusion Status OK TO TRANSFUSE    Unit Number [id number]    Blood Component Type  PLTPHER LR1    Unit division 00    Status of Unit ALLOCATED    Transfusion Status OK TO TRANSFUSE    Ct Head Wo Contrast  Result Date: 09/09/2016 CLINICAL DATA:  Syncopal episode. Trauma to head. Right periorbital hematoma. Laceration to the right side of the face and head, and upper lip EXAM: CT HEAD WITHOUT CONTRAST  CT MAXILLOFACIAL WITHOUT CONTRAST CT CERVICAL SPINE WITHOUT CONTRAST TECHNIQUE: Multidetector CT imaging of the head, cervical spine, and maxillofacial structures were performed using the standard protocol without intravenous contrast. Multiplanar CT image reconstructions of the cervical spine and maxillofacial structures were also generated. COMPARISON:  None. FINDINGS: CT HEAD FINDINGS Brain: Extensive subarachnoid hemorrhage is present within the right sylvian fissure an over the right convexity. There is hemorrhagic contusion involving the right temporal tip and anterior inferior right frontal lobe along the anterior cranial fossa. Extensive subdural hematoma is present along the left side of the falx cerebri. Additional subarachnoid hemorrhage is present over the high left frontal convexity. Additional areas of subarachnoid hemorrhage are present over the lateral left temporal lobe. Vascular: No focal hyperdense vessel is present. Skull: There is a frontal process fracture of the right maxilla. The calvarium is otherwise intact. Right zygomatic arch is intact. Please see face CT for further detail of right maxillary sinus fracture. Other: Extensive right periorbital hematoma is noted. CT MAXILLOFACIAL FINDINGS Osseous: A comminuted anterior fractures present in the right maxillary sinus. The right maxillary sinus is filled with blood. The frontal process the right maxillary sinus is displaced medially 2 mm. There is also a comminuted distal right nasal bone fracture. Right zygomatic arch is intact. The mandible is intact and located. Orbits: The globes are intact bilaterally. Extensive  right periorbital hematoma is present. No other underlying fracture is evident. Sinuses: Hemorrhage fills the right maxillary sinus. The remaining paranasal sinuses and the mastoid air cells are clear. Soft tissues: Extensive hemorrhages soft tissue swelling is present over the right side of the face. This continues to the level the mandible. CT CERVICAL SPINE FINDINGS Alignment: Slight anterolisthesis is present at C4-5. AP alignment is otherwise anatomic. Skull base and vertebrae: The craniocervical junction is intact. Soft tissues and spinal canal: The soft tissues the neck demonstrate bilateral carotid bifurcation atherosclerotic disease. The thyroid is atrophic. No significant adenopathy is present. No focal soft tissue injury is present. There is fatty infiltration of the parotid glands bilaterally. Disc levels: Chronic endplate degenerate change and loss of disc height is most evident at C5-6. Uncovertebral spurring contributes to moderate foraminal stenosis bilaterally at this level. Multilevel advanced facet hypertrophy is present. There is fusion across the facet joints at C3-4 and C4-5 bilaterally. Moderate facet degenerative changes are present at C2-3, left greater than right. Upper chest: The lung apices are clear. Atherosclerotic changes are present at the aortic arch without significant aneurysm or stenosis. IMPRESSION: 1. Extensive subarachnoid hemorrhage throughout the right sylvian fissure in lateral left temporal lobe. 2. Hemorrhagic contusion involving the anterior right temporal tip and anterior frontal lobes, right greater than left. 3. Subdural blood along the left side of the falx cerebri. 4. Large right periorbital hematoma with extensive inflammatory changes extending into the right side of the face. 5. Comminuted anterior maxillary sinus fracture with blood throughout the right maxillary sinus. 6. Minimally displaced frontal process maxillary fracture. 7. Minimally displaced anterior  right nasal bone fractures. 8. The mandible is intact. 9. No acute abnormality of the cervical spine. 10. Degenerative changes of the cervical spine are most pronounced at C5-6. These results were called by telephone at the time of interpretation on 09/09/2016 at 2:47 pm to Dr. Wyvonnia Dusky , who verbally acknowledged these results. Electronically Signed   By: San Morelle M.D.   On: 09/09/2016 14:48   Ct Cervical Spine Wo Contrast  Result Date: 09/09/2016 CLINICAL DATA:  Syncopal episode. Trauma to  head. Right periorbital hematoma. Laceration to the right side of the face and head, and upper lip EXAM: CT HEAD WITHOUT CONTRAST CT MAXILLOFACIAL WITHOUT CONTRAST CT CERVICAL SPINE WITHOUT CONTRAST TECHNIQUE: Multidetector CT imaging of the head, cervical spine, and maxillofacial structures were performed using the standard protocol without intravenous contrast. Multiplanar CT image reconstructions of the cervical spine and maxillofacial structures were also generated. COMPARISON:  None. FINDINGS: CT HEAD FINDINGS Brain: Extensive subarachnoid hemorrhage is present within the right sylvian fissure an over the right convexity. There is hemorrhagic contusion involving the right temporal tip and anterior inferior right frontal lobe along the anterior cranial fossa. Extensive subdural hematoma is present along the left side of the falx cerebri. Additional subarachnoid hemorrhage is present over the high left frontal convexity. Additional areas of subarachnoid hemorrhage are present over the lateral left temporal lobe. Vascular: No focal hyperdense vessel is present. Skull: There is a frontal process fracture of the right maxilla. The calvarium is otherwise intact. Right zygomatic arch is intact. Please see face CT for further detail of right maxillary sinus fracture. Other: Extensive right periorbital hematoma is noted. CT MAXILLOFACIAL FINDINGS Osseous: A comminuted anterior fractures present in the right maxillary  sinus. The right maxillary sinus is filled with blood. The frontal process the right maxillary sinus is displaced medially 2 mm. There is also a comminuted distal right nasal bone fracture. Right zygomatic arch is intact. The mandible is intact and located. Orbits: The globes are intact bilaterally. Extensive right periorbital hematoma is present. No other underlying fracture is evident. Sinuses: Hemorrhage fills the right maxillary sinus. The remaining paranasal sinuses and the mastoid air cells are clear. Soft tissues: Extensive hemorrhages soft tissue swelling is present over the right side of the face. This continues to the level the mandible. CT CERVICAL SPINE FINDINGS Alignment: Slight anterolisthesis is present at C4-5. AP alignment is otherwise anatomic. Skull base and vertebrae: The craniocervical junction is intact. Soft tissues and spinal canal: The soft tissues the neck demonstrate bilateral carotid bifurcation atherosclerotic disease. The thyroid is atrophic. No significant adenopathy is present. No focal soft tissue injury is present. There is fatty infiltration of the parotid glands bilaterally. Disc levels: Chronic endplate degenerate change and loss of disc height is most evident at C5-6. Uncovertebral spurring contributes to moderate foraminal stenosis bilaterally at this level. Multilevel advanced facet hypertrophy is present. There is fusion across the facet joints at C3-4 and C4-5 bilaterally. Moderate facet degenerative changes are present at C2-3, left greater than right. Upper chest: The lung apices are clear. Atherosclerotic changes are present at the aortic arch without significant aneurysm or stenosis. IMPRESSION: 1. Extensive subarachnoid hemorrhage throughout the right sylvian fissure in lateral left temporal lobe. 2. Hemorrhagic contusion involving the anterior right temporal tip and anterior frontal lobes, right greater than left. 3. Subdural blood along the left side of the falx  cerebri. 4. Large right periorbital hematoma with extensive inflammatory changes extending into the right side of the face. 5. Comminuted anterior maxillary sinus fracture with blood throughout the right maxillary sinus. 6. Minimally displaced frontal process maxillary fracture. 7. Minimally displaced anterior right nasal bone fractures. 8. The mandible is intact. 9. No acute abnormality of the cervical spine. 10. Degenerative changes of the cervical spine are most pronounced at C5-6. These results were called by telephone at the time of interpretation on 09/09/2016 at 2:47 pm to Dr. Wyvonnia Dusky , who verbally acknowledged these results. Electronically Signed   By: Wynetta Fines.D.  On: 09/09/2016 14:48   Ct Maxillofacial Wo Contrast  Result Date: 09/09/2016 CLINICAL DATA:  Syncopal episode. Trauma to head. Right periorbital hematoma. Laceration to the right side of the face and head, and upper lip EXAM: CT HEAD WITHOUT CONTRAST CT MAXILLOFACIAL WITHOUT CONTRAST CT CERVICAL SPINE WITHOUT CONTRAST TECHNIQUE: Multidetector CT imaging of the head, cervical spine, and maxillofacial structures were performed using the standard protocol without intravenous contrast. Multiplanar CT image reconstructions of the cervical spine and maxillofacial structures were also generated. COMPARISON:  None. FINDINGS: CT HEAD FINDINGS Brain: Extensive subarachnoid hemorrhage is present within the right sylvian fissure an over the right convexity. There is hemorrhagic contusion involving the right temporal tip and anterior inferior right frontal lobe along the anterior cranial fossa. Extensive subdural hematoma is present along the left side of the falx cerebri. Additional subarachnoid hemorrhage is present over the high left frontal convexity. Additional areas of subarachnoid hemorrhage are present over the lateral left temporal lobe. Vascular: No focal hyperdense vessel is present. Skull: There is a frontal process fracture of  the right maxilla. The calvarium is otherwise intact. Right zygomatic arch is intact. Please see face CT for further detail of right maxillary sinus fracture. Other: Extensive right periorbital hematoma is noted. CT MAXILLOFACIAL FINDINGS Osseous: A comminuted anterior fractures present in the right maxillary sinus. The right maxillary sinus is filled with blood. The frontal process the right maxillary sinus is displaced medially 2 mm. There is also a comminuted distal right nasal bone fracture. Right zygomatic arch is intact. The mandible is intact and located. Orbits: The globes are intact bilaterally. Extensive right periorbital hematoma is present. No other underlying fracture is evident. Sinuses: Hemorrhage fills the right maxillary sinus. The remaining paranasal sinuses and the mastoid air cells are clear. Soft tissues: Extensive hemorrhages soft tissue swelling is present over the right side of the face. This continues to the level the mandible. CT CERVICAL SPINE FINDINGS Alignment: Slight anterolisthesis is present at C4-5. AP alignment is otherwise anatomic. Skull base and vertebrae: The craniocervical junction is intact. Soft tissues and spinal canal: The soft tissues the neck demonstrate bilateral carotid bifurcation atherosclerotic disease. The thyroid is atrophic. No significant adenopathy is present. No focal soft tissue injury is present. There is fatty infiltration of the parotid glands bilaterally. Disc levels: Chronic endplate degenerate change and loss of disc height is most evident at C5-6. Uncovertebral spurring contributes to moderate foraminal stenosis bilaterally at this level. Multilevel advanced facet hypertrophy is present. There is fusion across the facet joints at C3-4 and C4-5 bilaterally. Moderate facet degenerative changes are present at C2-3, left greater than right. Upper chest: The lung apices are clear. Atherosclerotic changes are present at the aortic arch without significant  aneurysm or stenosis. IMPRESSION: 1. Extensive subarachnoid hemorrhage throughout the right sylvian fissure in lateral left temporal lobe. 2. Hemorrhagic contusion involving the anterior right temporal tip and anterior frontal lobes, right greater than left. 3. Subdural blood along the left side of the falx cerebri. 4. Large right periorbital hematoma with extensive inflammatory changes extending into the right side of the face. 5. Comminuted anterior maxillary sinus fracture with blood throughout the right maxillary sinus. 6. Minimally displaced frontal process maxillary fracture. 7. Minimally displaced anterior right nasal bone fractures. 8. The mandible is intact. 9. No acute abnormality of the cervical spine. 10. Degenerative changes of the cervical spine are most pronounced at C5-6. These results were called by telephone at the time of interpretation on 09/09/2016 at 2:47  pm to Dr. Wyvonnia Dusky , who verbally acknowledged these results. Electronically Signed   By: San Morelle M.D.   On: 09/09/2016 14:48    Review of Systems  All other systems reviewed and are negative.   Blood pressure 142/70, pulse 75, temperature 97.7 F (36.5 C), temperature source Oral, resp. rate 20, height _0  (1.676 m), weight 81.2 kg (179 lb), SpO2 97 %. Physical Exam  Vitals reviewed. Constitutional: She is oriented to person, place, and time. She appears well-developed and well-nourished. No distress.  Elderly female, resting comfortably, extensive family at [place]:  Head: Normocephalic. Head is with contusion.    Right Ear: External ear normal.  Left Ear: External ear normal.  Mouth/Throat: Oropharynx is clear and moist.  Eyes: Conjunctivae and EOM are normal. Pupils are equal, round, and reactive to light. No scleral icterus.    Right eye swollen shut; right facial/cheek contusion/swelling  Neck: Normal range of motion and full passive range of motion without pain. Neck supple. No spinous process  tenderness and no muscular tenderness present. No tracheal deviation present. No thyromegaly present.  Cardiovascular: Normal rate, regular rhythm, normal heart sounds and intact distal pulses.   Respiratory: Effort normal and breath sounds normal. No accessory muscle usage or stridor. No tachypnea. No respiratory distress. She has no wheezes.  GI: Soft. Normal appearance. She exhibits no distension. There is no tenderness. There is no rebound, no guarding and no CVA tenderness.  Musculoskeletal: She exhibits no edema or tenderness.  Right knee contusion but nontender; FROM, MAE, no palpable deformity  Lymphadenopathy:    She has no cervical adenopathy.  Neurological: She is alert and oriented to person, place, and time. No cranial nerve deficit or sensory deficit. She exhibits normal muscle tone. GCS eye subscore is 4. GCS verbal subscore is 5. GCS motor subscore is 6.  GCS 14-15; 2017, [place], [place], [place].   Skin: Skin is warm and dry. No rash noted. She is not diaphoretic. No erythema. No pallor.     Psychiatric: She has a normal mood and affect. Her behavior is normal. Judgment and thought content normal.     Assessment/Plan Syncopal episode with fall TBI - SDH, SAH, IPH Right maxillary sinus fx Right Nasal bone fx Right periorbital hematoma  Right facial hematoma Scattered contusions CAD h/o PTCA, DES stents Chronically anticoagulated - plavix HPL HTN Hypothyroidism Pulmonary HTN Pulmonary fibrosis  NSG consult - dr ditty Facial trauma consult - dr shoemaker Cards consult pending for syncopal episode w/u and given heart hx - spoke with fellow, telemetry for now. Will see sunday ICU admission Serial neuro exams Supportive care scds  Leighton Ruff. Redmond Pulling, MD, [place], Bariatric, & Minimally Invasive Surgery [facility], [place]  [facility] M 09/09/2016, 6:13 PM   Procedures

## 2016-09-09 NOTE — H&P (Signed)
Patient ID: Kristin Shaffer MRN: [id number], DOB/AGE: 1930/12/08   Admit date: 09/09/2016  Requesting Physician: Primary Physician: Stephens Shire, MD Primary Cardiologist: [facility] Reason for admission: Syncope c/b facial injuries/SAH/SDH  Pt. Profile:  Kristin Shaffer is a 82 y.o. female with a PMH significant for CAD s/p NSTEMI with DESx2 to the RCA with later ISR, HTN, dyslipidemia, PHTN, and pulmonary fibrosis who is being admitted to the ICU following a syncopal episode resulting in SDH/SAH/maxillofacial fractures.  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  She had no prodomal symptoms, specifically denying CP/SOB/palpitations/lightheadedness/dizziness/sensation of a racing heart. Her next memory after gaining consciousness is being in the ambulance surrounded by paramedics.  Since arrival to the ED she has been hemodynamically stable and neurocognitively intact.  CT of the head/maxillofacial structures/c-spine demonstrated significant SAH, a SDH, and both maxillary sinus and nasal bone fractures.  The cardiology service was consulted for assistance with syncope w/u.  Kristin Shaffer is currently laying comfortably in bed, she has no acute complaints and remarkably describes only minimal facial pain.  She denies recent presyncopal or syncopal events in recent weeks.  However, she did previous have a fall in the Spring of this year that was previously attributed to a mechanical issue, ie. Tripping over a carpet. Kristin Shaffer now admits that she did not recall how she fell afterwards, and does think that she "woke up on the floor."  Her daughter is at the bedside and corroborates this story in that her mother told her just afterwards that she didn't remember how she fell.    Problem List  Past Medical History:  Diagnosis Date  . Anxiety   . Arthritis    "different places; not bad" (03/20/2013)  . Coronary  artery disease   . Exertional shortness of breath   . GERD (gastroesophageal reflux disease)   . High cholesterol   . Hypertension   . Hypothyroidism   . Myocardial infarction    "Dr's saw evidence I might have had a heart attack" (03/20/2013)  . NSTEMI (non-ST elevated myocardial infarction) ([place]) 03/20/2013   cath - mid RCA  . Pneumonia 2012  . Skin cancer    "both legs; right arm" (03/20/2013)    Past Surgical History:  Procedure Laterality Date  . APPENDECTOMY  2003  . BREAST BIOPSY Bilateral 1990s   "total of 5; 2 on one side, 3 on the other; all benign" (03/20/2013)  . CATARACT EXTRACTION W/ INTRAOCULAR LENS  IMPLANT, BILATERAL Bilateral ~ 2008  . CORONARY ANGIOPLASTY  12/08/2013  . CORONARY ANGIOPLASTY WITH STENT PLACEMENT  03/20/2013   NSTEMI - subtotal occlusion of mid RCA - PCI of mid RCA of long tubular 40-60% lesion - 2 tandem 95-99% subtotal occlusions - 3 overlapping Xience Xpedition DES (Dr. Roni Bread)   . [place] OF UTERUS  1956?  Marland Kitchen LEFT HEART CATHETERIZATION WITH CORONARY ANGIOGRAM N/A 03/20/2013   Procedure: LEFT HEART CATHETERIZATION WITH CORONARY ANGIOGRAM;  Surgeon: Sanda Klein, MD;  Location: [facility] CATH [facility];  Service: Cardiovascular;  Laterality: N/A;  . LEFT HEART CATHETERIZATION WITH CORONARY ANGIOGRAM N/A 12/08/2013   Procedure: LEFT HEART CATHETERIZATION WITH CORONARY ANGIOGRAM;  Surgeon: Blane Ohara, MD;  Location: [facility] CATH [facility];  Service: Cardiovascular;  Laterality: N/A;  . SKIN CANCER EXCISION     "1 off right arm; 2 off each leg" (03/20/2013)  . TRANSTHORACIC ECHOCARDIOGRAM  03/2013  EF [id number], grade 1 diastolic dysfunction; mild MR; calcifed MV annulus; LA mildly dilated     Allergies  Allergies  Allergen Reactions  . Atorvastatin Other (See Comments)    Leg weakness and cramps  . Hydrocodone-Acetaminophen Nausea And Vomiting  . Omeprazole Other (See Comments)    Made acid reflux worse  . Zocor [Simvastatin] Other (See Comments)     Trouble Walking, Leg weakness and cramping  . Latex Hives and Rash     Home Medications  Prior to Admission medications   Medication Sig Start Date End Date Taking? Authorizing Provider  aspirin EC 81 MG tablet Take 81 mg by mouth daily.   Yes Historical Provider, MD  atorvastatin (LIPITOR) 20 MG tablet Take 1 tablet (20 mg total) by mouth daily. 08/31/16  Yes Pixie Casino, MD  Ca Carbonate-Mag Hydroxide (ROLAIDS) 550-110 MG CHEW Chew 1 tablet by mouth daily as needed (heartburn).    Yes Historical Provider, MD  CALCIUM-VITAMIN D PO Take 1 tablet by mouth daily.   Yes Historical Provider, MD  clopidogrel (PLAVIX) 75 MG tablet TAKE 1 TABLET EVERY DAY 08/15/16  Yes Pixie Casino, MD  cyclobenzaprine (FLEXERIL) 5 MG tablet Take 1 tablet (5 mg total) by mouth 2 (two) times daily as needed for muscle spasms. 09/15/15  Yes Pixie Casino, MD  ezetimibe (ZETIA) 10 MG tablet Take 5 mg by mouth daily.   Yes Historical Provider, MD  fenofibrate 54 MG tablet Take 1 tablet (54 mg total) by mouth daily. 10/05/15  Yes Pixie Casino, MD  ibuprofen (ADVIL,MOTRIN) 200 MG tablet Take 200 mg by mouth every 6 (six) hours as needed for moderate pain.   Yes Historical Provider, MD  levothyroxine (SYNTHROID, LEVOTHROID) 100 MCG tablet Take 100 mcg by mouth daily before breakfast.   Yes Historical Provider, MD  lisinopril (PRINIVIL,ZESTRIL) 10 MG tablet TAKE 1 TABLET BY MOUTH EVERY DAY 06/18/14  Yes Pixie Casino, MD  Magnesium Oxide 250 MG TABS Take 1 tablet by mouth daily.   Yes Historical Provider, MD  meloxicam (MOBIC) 7.5 MG tablet Take 1 tablet (7.5 mg total) by mouth daily. 06/30/16  Yes Tanna Furry, MD  metoprolol tartrate (LOPRESSOR) 25 MG tablet TAKE 1/2 TABLET TWICE DAILY 08/23/16  Yes Pixie Casino, MD  nitroGLYCERIN (NITROSTAT) 0.4 MG SL tablet Place 1 tablet (0.4 mg total) under the tongue every 5 (five) minutes as needed for chest pain. 09/15/15  Yes Pixie Casino, MD  potassium chloride SA  (K-DUR,KLOR-CON) 20 MEQ tablet Take 1 tablet (20 mEq total) by mouth daily. 05/20/13  Yes Pixie Casino, MD  HYDROcodone-acetaminophen (NORCO/VICODIN) 5-325 MG tablet Take 1 tablet by mouth every 4 (four) hours as needed. Patient not taking: Reported on 09/09/2016 06/30/16   Tanna Furry, MD  methocarbamol (ROBAXIN) 500 MG tablet Take 1 tablet (500 mg total) by mouth 3 (three) times daily between meals as needed. Patient not taking: Reported on 09/09/2016 06/30/16   Tanna Furry, MD    Family History  Family History  Problem Relation Age of Onset  . Dementia Mother   . Lung cancer Father   . CAD Father 18  . Cancer Brother       Family Status  Relation Status  . Mother Deceased at age 32   dementia  . Father Deceased at age 36   lung cancer  . Maternal Grandmother Deceased  . Maternal Grandfather Deceased  . Paternal Grandmother Deceased  . Paternal Grandfather Deceased  .  Brother Deceased at age 30   cancer  . Brother      Social History  Social History   Social History  . Marital status: Widowed    Spouse name: N/A  . Number of children: N/A  . Years of education: N/A   Occupational History  . retired     Social History Main Topics  . Smoking status: Never Smoker  . Smokeless tobacco: Never Used  . Alcohol use No  . Drug use: No  . Sexual activity: No   Other Topics Concern  . Not on file   Social History Narrative   Lives alone.      Review of Systems General:  No chills, fever, night sweats or weight changes.  Cardiovascular:  No chest pain, dyspnea on exertion, edema, orthopnea, palpitations, paroxysmal nocturnal dyspnea. Dermatological: No rash, lesions/masses Respiratory: No cough, dyspnea Urologic: No hematuria, dysuria Abdominal:   No nausea, vomiting, diarrhea, bright red blood per rectum, melena, or hematemesis Neurologic:  No visual changes, wkns, changes in mental status. All other systems reviewed and are otherwise negative except as noted  above.  Physical Exam  Blood pressure 123/90, pulse 72, temperature 97.7 F (36.5 C), temperature source Oral, resp. rate 20, height 5\' 6"  (1.676 m), weight 81.2 kg (179 lb), SpO2 98 %.  General: Pleasant, NAD, AAOx3 Psych: Normal affect. Neuro: Alert and oriented X 3. Moves all extremities spontaneously. HEENT: large periorbital swelling with eye lid completely swollen shut, perioral swelling and ecchymosis.  Neck: Supple without bruits or JVD. Lungs:  CTAB, no w/r/c Heart: RRR, +S1 +S2, soft and mid-peaking II/VI SEM at the LUSB without radiation to the carotids, no diastolic mumurs, no apical murmurs, no carotid bruits and normal upstrokes Abdomen: Soft, non-tender, non-distended, BS + x 4.  Extremities: No clubbing, cyanosis or edema. DP/PT/Radials 2+ and equal bilaterally.  Labs  No results for input(s): CKTOTAL, CKMB, TROPONINI in the last 72 hours. Lab Results  Component Value Date   WBC 7.6 09/09/2016   HGB 14.6 09/09/2016   HCT 43.1 09/09/2016   MCV 90.0 09/09/2016   PLT 201 09/09/2016    Recent Labs Lab 09/09/16 1230  NA 138  K 4.4  CL 106  CO2 22  BUN 19  CREATININE 0.98  CALCIUM 9.8  GLUCOSE 95   Lab Results  Component Value Date   CHOL 249 (H) 04/27/2015   HDL 64 04/27/2015   LDLCALC 156 (H) 04/27/2015   TRIG 147 04/27/2015   No results found for: DDIMER   Radiology/Studies  CT Head/Maxillofacial/C-spine  IMPRESSION: 1. Extensive subarachnoid hemorrhage throughout the right sylvian fissure in lateral left temporal lobe. 2. Hemorrhagic contusion involving the anterior right temporal tip and anterior frontal lobes, right greater than left. 3. Subdural blood along the left side of the falx cerebri. 4. Large right periorbital hematoma with extensive inflammatory changes extending into the right side of the face. 5. Comminuted anterior maxillary sinus fracture with blood throughout the right maxillary sinus. 6. Minimally displaced frontal process  maxillary fracture. 7. Minimally displaced anterior right nasal bone fractures. 8. The mandible is intact. 9. No acute abnormality of the cervical spine. 10. Degenerative changes of the cervical spine are most pronounced at C5-6.  TTE (Apr 14, 2016) - Left ventricle: The cavity size was normal. Systolic function was   normal. The estimated ejection fraction was in the range of 60%   to 65%. Wall motion was normal; there were no regional wall   motion abnormalities. Doppler parameters  are consistent with   abnormal left ventricular relaxation (grade 1 diastolic   dysfunction). Doppler parameters are consistent with high   ventricular filling pressure. - Aortic valve: Trileaflet; mildly thickened, mildly calcified   leaflets. There was mild regurgitation. - Mitral valve: Calcified annulus. Mildly thickened leaflets . - Left atrium: The atrium was mildly dilated. - Pulmonary arteries: Systolic pressure was mildly increased. PA   peak pressure: 45 mm Hg (S).  ECG Per my review, NSR with normal axis and normal intervals, no pathologic Qwaves or ST/Twave changes indicative of ischemia, early R-S transition  ASSESSMENT AND PLAN  Kristin Shaffer is a 82 y.o. female with a PMH significant for CAD s/p NSTEMI with DESx2 to the RCA in 2014 with later ISR, HTN, dyslipidemia, PHTN, and pulmonary fibrosis who is being admitted to the ICU following a syncopal episode resulting in SDH/SAH/maxillofacial fractures.  Her syncopal event is concerning for an arrhythmogenic event given the sudden onset, lack of prodrome, and severe injuries.  She has no history of ventricular ectopy but given her known CAD and previous MI a sudden onset of scar-mediated ventricular arrhythmia is possible.  She has no evidence of high-grade conduction block or other acute abnormality on ECG and thus far telemetry is unrevealing.  1) Syncope; concerning for a cardiac and specifically an arrhythmogenic etiology.   - continuous  telemetry overnight - continue metoprolol 12.5mg  PO BID - maintain K>4 and Mg>2 - repeat TTE in am - given known CAD and suspicion for an arrhythmogenic cause an EP study may be indicated if preliminary workup is unrevealing  2) CAD; low clinical suspicion for an ischemic cause of tonight's event, although if pt develops significant ventricular ectopy overnight then a repeat cardiac catheterization or ischemic w/u otherwise may be indicated - it is reasonable to hold her ASA and clopidogrel now given SAH/SDH and she is more than one year since last PCI.  Her risk for recurrent MI off of DAPT in the pro-inflammatory setting of her extensive injuries is increased from baseline, but this risk is likely outweighed by the potential for extension of her intracranial bleeds. - ECG if pt develops CP - reasonable to check one troponin and if mildly elevated then repeat until peak.  Again low suspicion for ACS but given that ischemia can drive VT/VF defining what degree of myocardial injury occurred could be helpful  3) Dyslipidemia - continue Atorvastatin, Zetia, and fenofibrate at outpatient dosing schedule  4) HTN; BP stable here - continue metoprolol at lisinopril at outpatient dosing schedule  5) Hypothyroidism - continue home synthroid   Signed, Clayborne Dana, MD 09/09/2016, 7:29 PM

## 2016-09-09 NOTE — ED Notes (Signed)
Attempted report x1. 

## 2016-09-09 NOTE — ED Notes (Signed)
Attempted report x 2 

## 2016-09-09 NOTE — ED Provider Notes (Signed)
[facility] DEPT Provider Note   CSN: [id number] Arrival date & time: 09/09/16  1216     History   Chief Complaint Chief Complaint  Patient presents with  . Fall  . Head Injury  . Facial Swelling    HPI  Kristin Shaffer is a 82 y.o. female complaining of syncopal event while she was at church as prior to arrival, patient has no memory of the event, she is accompanied by her grandson called to come get her from church, she was at the church they were bagging potatoes for homeless people she doesn't remember any fall or syncope or feeling poorly, she remembers bagging the potatoes and the next thing she remembers is being on the ambulance. She takes a daily low-dose aspirin in addition to Plavix every day. It appears that she fell forward onto the ground. She denies any other pain in the chest, abdomen or extremities. Last tetanus shot is unknown, significant facial swelling and pain. Patient recently started Flexeril and meloxicam. She's not combining it with any other pain medication.   Cardiology: [facility]  HPI  Past Medical History:  Diagnosis Date  . Anxiety   . Arthritis    "different places; not bad" (03/20/2013)  . Coronary artery disease   . Exertional shortness of breath   . GERD (gastroesophageal reflux disease)   . High cholesterol   . Hypertension   . Hypothyroidism   . Myocardial infarction    "Dr's saw evidence I might have had a heart attack" (03/20/2013)  . NSTEMI (non-ST elevated myocardial infarction) ([place]) 03/20/2013   cath - mid RCA  . Pneumonia 2012  . Skin cancer    "both legs; right arm" (03/20/2013)    Patient Active Problem List   Diagnosis Date Noted  . Pulmonary fibrosis ([place]) 05/22/2016  . DOE (dyspnea on exertion) 04/14/2016  . Right flank pain 04/14/2016  . Pulmonary hypertension 04/14/2016  . Muscle spasm of back 09/15/2015  . Myalgia 03/23/2015  . Intermediate coronary syndrome ([place]) 12/07/2013  . Hyperlipidemia 12/03/2013  . Presence  of drug coated stent in right coronary artery 03/22/2013  . NSTEMI (non-ST elevated myocardial infarction) ([place]) 03/21/2013  . Unstable angina 03/20/2013  . Hypertensive urgency -Accelerated Hypertension 03/20/2013  . GERD (gastroesophageal reflux disease) 03/20/2013  . Hypothyroidism 03/20/2013    Past Surgical History:  Procedure Laterality Date  . APPENDECTOMY  2003  . BREAST BIOPSY Bilateral 1990s   "total of 5; 2 on one side, 3 on the other; all benign" (03/20/2013)  . CATARACT EXTRACTION W/ INTRAOCULAR LENS  IMPLANT, BILATERAL Bilateral ~ 2008  . CORONARY ANGIOPLASTY  12/08/2013  . CORONARY ANGIOPLASTY WITH STENT PLACEMENT  03/20/2013   NSTEMI - subtotal occlusion of mid RCA - PCI of mid RCA of long tubular 40-60% lesion - 2 tandem 95-99% subtotal occlusions - 3 overlapping Xience Xpedition DES (Dr. Roni Bread)   . [place] OF UTERUS  1956?  Marland Kitchen LEFT HEART CATHETERIZATION WITH CORONARY ANGIOGRAM N/A 03/20/2013   Procedure: LEFT HEART CATHETERIZATION WITH CORONARY ANGIOGRAM;  Surgeon: Sanda Klein, MD;  Location: [facility] CATH LAB;  Service: Cardiovascular;  Laterality: N/A;  . LEFT HEART CATHETERIZATION WITH CORONARY ANGIOGRAM N/A 12/08/2013   Procedure: LEFT HEART CATHETERIZATION WITH CORONARY ANGIOGRAM;  Surgeon: Blane Ohara, MD;  Location: [facility] CATH LAB;  Service: Cardiovascular;  Laterality: N/A;  . SKIN CANCER EXCISION     "1 off right arm; 2 off each leg" (03/20/2013)  . TRANSTHORACIC ECHOCARDIOGRAM  03/2013  EF [id number], grade 1 diastolic dysfunction; mild MR; calcifed MV annulus; LA mildly dilated    OB History    No data available       Home Medications    Prior to Admission medications   Medication Sig Start Date End Date Taking? Authorizing Provider  aspirin EC 81 MG tablet Take 81 mg by mouth daily.   Yes Historical Provider, MD  atorvastatin (LIPITOR) 20 MG tablet Take 1 tablet (20 mg total) by mouth daily. 08/31/16  Yes Pixie Casino, MD  Ca  Carbonate-Mag Hydroxide (ROLAIDS) 550-110 MG CHEW Chew 1 tablet by mouth daily as needed.   Yes Historical Provider, MD  CALCIUM-VITAMIN D PO Take 1 tablet by mouth daily.   Yes Historical Provider, MD  clopidogrel (PLAVIX) 75 MG tablet TAKE 1 TABLET EVERY DAY 08/15/16  Yes Pixie Casino, MD  cyclobenzaprine (FLEXERIL) 5 MG tablet Take 1 tablet (5 mg total) by mouth 2 (two) times daily as needed for muscle spasms. 09/15/15  Yes Pixie Casino, MD  ezetimibe (ZETIA) 10 MG tablet Take 5 mg by mouth daily.   Yes Historical Provider, MD  fenofibrate 54 MG tablet Take 1 tablet (54 mg total) by mouth daily. 10/05/15  Yes Pixie Casino, MD  ibuprofen (ADVIL,MOTRIN) 200 MG tablet Take 200 mg by mouth every 6 (six) hours as needed for moderate pain.   Yes Historical Provider, MD  levothyroxine (SYNTHROID, LEVOTHROID) 100 MCG tablet Take 100 mcg by mouth daily before breakfast.   Yes Historical Provider, MD  lisinopril (PRINIVIL,ZESTRIL) 10 MG tablet TAKE 1 TABLET BY MOUTH EVERY DAY 06/18/14  Yes Pixie Casino, MD  meloxicam (MOBIC) 7.5 MG tablet Take 1 tablet (7.5 mg total) by mouth daily. 06/30/16  Yes Tanna Furry, MD  metoprolol tartrate (LOPRESSOR) 25 MG tablet TAKE 1/2 TABLET TWICE DAILY 08/23/16  Yes Pixie Casino, MD  nitroGLYCERIN (NITROSTAT) 0.4 MG SL tablet Place 1 tablet (0.4 mg total) under the tongue every 5 (five) minutes as needed for chest pain. 09/15/15  Yes Pixie Casino, MD  potassium chloride SA (K-DUR,KLOR-CON) 20 MEQ tablet Take 1 tablet (20 mEq total) by mouth daily. 05/20/13  Yes Pixie Casino, MD  calcium gluconate 500 MG tablet Take 1 tablet by mouth daily.    Historical Provider, MD  HYDROcodone-acetaminophen (NORCO/VICODIN) 5-325 MG tablet Take 1 tablet by mouth every 4 (four) hours as needed. 06/30/16   Tanna Furry, MD  Magnesium Oxide 250 MG TABS Take 1 tablet by mouth daily.    Historical Provider, MD  methocarbamol (ROBAXIN) 500 MG tablet Take 1 tablet (500 mg total) by mouth  3 (three) times daily between meals as needed. 06/30/16   Tanna Furry, MD  omeprazole (PRILOSEC) 20 MG capsule Take 20 mg by mouth 2 (two) times daily.     Historical Provider, MD    Family History Family History  Problem Relation Age of Onset  . Dementia Mother   . Lung cancer Father   . CAD Father 23  . Cancer Brother     Social History Social History  Substance Use Topics  . Smoking status: Never Smoker  . Smokeless tobacco: Never Used  . Alcohol use No     Allergies   Atorvastatin; Hydrocodone-acetaminophen; Omeprazole; Zocor [simvastatin]; and Latex   Review of Systems Review of Systems  10 systems reviewed and found to be negative, except as noted in the HPI.   Physical Exam Updated Vital Signs BP 170/59  Pulse 69   Temp 97.4 F (36.3 C) (Oral)   Resp 19   Ht 5\' 6"  (1.676 m)   Wt 81.2 kg   SpO2 100%   BMI 28.89 kg/m   Physical Exam  Constitutional: She is oriented to person, place, and time. She appears well-developed and well-nourished. No distress.  HENT:  Head: Normocephalic.  Mouth/Throat: Oropharynx is clear and moist.  Right-sided periorbital contusion with eyes swollen shut she has dried blood in the right nares and dried blood in the mouth, no grossly loose teeth. No hemotympanum. Partial-thickness lacerations as diagrammed.  With attending physician we have attempted to open eyes using paperclips, iris and pupil is visualized, patient has light perception and does not see any floaters.  Eyes: Conjunctivae and EOM are normal. Pupils are equal, round, and reactive to light.    Neck:  C-spine supported by towel. No midline C-spine tenderness, strength is 5 out of 52 upper extremities, good strength is equal bilaterally. Sensation is grossly intact.  Cardiovascular: Normal rate, regular rhythm and intact distal pulses.   Pulmonary/Chest: Effort normal and breath sounds normal.  Abdominal: Soft. There is no tenderness.  Musculoskeletal: Normal  range of motion.  Neurological: She is alert and oriented to person, place, and time.  Follows commands, Clear, goal oriented speech, Strength is 5 out of 5x4 extremities. Sensation is grossly intact.   Skin: She is not diaphoretic.  Psychiatric: She has a normal mood and affect.  Nursing note and vitals reviewed.    ED Treatments / Results  Labs (all labs ordered are listed, but only abnormal results are displayed) Labs Reviewed  BASIC METABOLIC PANEL - Abnormal; Notable for the following:       Result Value   GFR calc non Af Amer 52 (*)    All other components within normal limits  CBC WITH DIFFERENTIAL/PLATELET  PROTIME-INR  CBG MONITORING, ED  PREPARE PLATELET PHERESIS  ABO/RH    EKG  EKG Interpretation  Date/Time:  Saturday September 09 2016 12:34:52 EDT Ventricular Rate:  65 PR Interval:    QRS Duration: 97 QT Interval:  410 QTC Calculation: 427 R Axis:   23 Text Interpretation:  Age not entered, assumed to be  82 years old for purpose of ECG interpretation Sinus rhythm No significant change was found Confirmed by Wyvonnia Dusky  MD, STEPHEN [phone]) on 09/09/2016 1:22:09 PM Also confirmed by Wyvonnia Dusky  MD, STEPHEN [phone]), editor [place]  CCT, [place] ([id number])  on 09/09/2016 2:18:35 PM       Radiology Ct Head Wo Contrast  Result Date: 09/09/2016 CLINICAL DATA:  Syncopal episode. Trauma to head. Right periorbital hematoma. Laceration to the right side of the face and head, and upper lip EXAM: CT HEAD WITHOUT CONTRAST CT MAXILLOFACIAL WITHOUT CONTRAST CT CERVICAL SPINE WITHOUT CONTRAST TECHNIQUE: Multidetector CT imaging of the head, cervical spine, and maxillofacial structures were performed using the standard protocol without intravenous contrast. Multiplanar CT image reconstructions of the cervical spine and maxillofacial structures were also generated. COMPARISON:  None. FINDINGS: CT HEAD FINDINGS Brain: Extensive subarachnoid hemorrhage is present within the right sylvian  fissure an over the right convexity. There is hemorrhagic contusion involving the right temporal tip and anterior inferior right frontal lobe along the anterior cranial fossa. Extensive subdural hematoma is present along the left side of the falx cerebri. Additional subarachnoid hemorrhage is present over the high left frontal convexity. Additional areas of subarachnoid hemorrhage are present over the lateral left temporal lobe. Vascular: No focal hyperdense vessel  is present. Skull: There is a frontal process fracture of the right maxilla. The calvarium is otherwise intact. Right zygomatic arch is intact. Please see face CT for further detail of right maxillary sinus fracture. Other: Extensive right periorbital hematoma is noted. CT MAXILLOFACIAL FINDINGS Osseous: A comminuted anterior fractures present in the right maxillary sinus. The right maxillary sinus is filled with blood. The frontal process the right maxillary sinus is displaced medially 2 mm. There is also a comminuted distal right nasal bone fracture. Right zygomatic arch is intact. The mandible is intact and located. Orbits: The globes are intact bilaterally. Extensive right periorbital hematoma is present. No other underlying fracture is evident. Sinuses: Hemorrhage fills the right maxillary sinus. The remaining paranasal sinuses and the mastoid air cells are clear. Soft tissues: Extensive hemorrhages soft tissue swelling is present over the right side of the face. This continues to the level the mandible. CT CERVICAL SPINE FINDINGS Alignment: Slight anterolisthesis is present at C4-5. AP alignment is otherwise anatomic. Skull base and vertebrae: The craniocervical junction is intact. Soft tissues and spinal canal: The soft tissues the neck demonstrate bilateral carotid bifurcation atherosclerotic disease. The thyroid is atrophic. No significant adenopathy is present. No focal soft tissue injury is present. There is fatty infiltration of the parotid  glands bilaterally. Disc levels: Chronic endplate degenerate change and loss of disc height is most evident at C5-6. Uncovertebral spurring contributes to moderate foraminal stenosis bilaterally at this level. Multilevel advanced facet hypertrophy is present. There is fusion across the facet joints at C3-4 and C4-5 bilaterally. Moderate facet degenerative changes are present at C2-3, left greater than right. Upper chest: The lung apices are clear. Atherosclerotic changes are present at the aortic arch without significant aneurysm or stenosis. IMPRESSION: 1. Extensive subarachnoid hemorrhage throughout the right sylvian fissure in lateral left temporal lobe. 2. Hemorrhagic contusion involving the anterior right temporal tip and anterior frontal lobes, right greater than left. 3. Subdural blood along the left side of the falx cerebri. 4. Large right periorbital hematoma with extensive inflammatory changes extending into the right side of the face. 5. Comminuted anterior maxillary sinus fracture with blood throughout the right maxillary sinus. 6. Minimally displaced frontal process maxillary fracture. 7. Minimally displaced anterior right nasal bone fractures. 8. The mandible is intact. 9. No acute abnormality of the cervical spine. 10. Degenerative changes of the cervical spine are most pronounced at C5-6. These results were called by telephone at the time of interpretation on 09/09/2016 at 2:47 pm to Dr. Wyvonnia Dusky , who verbally acknowledged these results. Electronically Signed   By: San Morelle M.D.   On: 09/09/2016 14:48   Ct Cervical Spine Wo Contrast  Result Date: 09/09/2016 CLINICAL DATA:  Syncopal episode. Trauma to head. Right periorbital hematoma. Laceration to the right side of the face and head, and upper lip EXAM: CT HEAD WITHOUT CONTRAST CT MAXILLOFACIAL WITHOUT CONTRAST CT CERVICAL SPINE WITHOUT CONTRAST TECHNIQUE: Multidetector CT imaging of the head, cervical spine, and maxillofacial  structures were performed using the standard protocol without intravenous contrast. Multiplanar CT image reconstructions of the cervical spine and maxillofacial structures were also generated. COMPARISON:  None. FINDINGS: CT HEAD FINDINGS Brain: Extensive subarachnoid hemorrhage is present within the right sylvian fissure an over the right convexity. There is hemorrhagic contusion involving the right temporal tip and anterior inferior right frontal lobe along the anterior cranial fossa. Extensive subdural hematoma is present along the left side of the falx cerebri. Additional subarachnoid hemorrhage is present over the  high left frontal convexity. Additional areas of subarachnoid hemorrhage are present over the lateral left temporal lobe. Vascular: No focal hyperdense vessel is present. Skull: There is a frontal process fracture of the right maxilla. The calvarium is otherwise intact. Right zygomatic arch is intact. Please see face CT for further detail of right maxillary sinus fracture. Other: Extensive right periorbital hematoma is noted. CT MAXILLOFACIAL FINDINGS Osseous: A comminuted anterior fractures present in the right maxillary sinus. The right maxillary sinus is filled with blood. The frontal process the right maxillary sinus is displaced medially 2 mm. There is also a comminuted distal right nasal bone fracture. Right zygomatic arch is intact. The mandible is intact and located. Orbits: The globes are intact bilaterally. Extensive right periorbital hematoma is present. No other underlying fracture is evident. Sinuses: Hemorrhage fills the right maxillary sinus. The remaining paranasal sinuses and the mastoid air cells are clear. Soft tissues: Extensive hemorrhages soft tissue swelling is present over the right side of the face. This continues to the level the mandible. CT CERVICAL SPINE FINDINGS Alignment: Slight anterolisthesis is present at C4-5. AP alignment is otherwise anatomic. Skull base and  vertebrae: The craniocervical junction is intact. Soft tissues and spinal canal: The soft tissues the neck demonstrate bilateral carotid bifurcation atherosclerotic disease. The thyroid is atrophic. No significant adenopathy is present. No focal soft tissue injury is present. There is fatty infiltration of the parotid glands bilaterally. Disc levels: Chronic endplate degenerate change and loss of disc height is most evident at C5-6. Uncovertebral spurring contributes to moderate foraminal stenosis bilaterally at this level. Multilevel advanced facet hypertrophy is present. There is fusion across the facet joints at C3-4 and C4-5 bilaterally. Moderate facet degenerative changes are present at C2-3, left greater than right. Upper chest: The lung apices are clear. Atherosclerotic changes are present at the aortic arch without significant aneurysm or stenosis. IMPRESSION: 1. Extensive subarachnoid hemorrhage throughout the right sylvian fissure in lateral left temporal lobe. 2. Hemorrhagic contusion involving the anterior right temporal tip and anterior frontal lobes, right greater than left. 3. Subdural blood along the left side of the falx cerebri. 4. Large right periorbital hematoma with extensive inflammatory changes extending into the right side of the face. 5. Comminuted anterior maxillary sinus fracture with blood throughout the right maxillary sinus. 6. Minimally displaced frontal process maxillary fracture. 7. Minimally displaced anterior right nasal bone fractures. 8. The mandible is intact. 9. No acute abnormality of the cervical spine. 10. Degenerative changes of the cervical spine are most pronounced at C5-6. These results were called by telephone at the time of interpretation on 09/09/2016 at 2:47 pm to Dr. Wyvonnia Dusky , who verbally acknowledged these results. Electronically Signed   By: San Morelle M.D.   On: 09/09/2016 14:48   Ct Maxillofacial Wo Contrast  Result Date: 09/09/2016 CLINICAL  DATA:  Syncopal episode. Trauma to head. Right periorbital hematoma. Laceration to the right side of the face and head, and upper lip EXAM: CT HEAD WITHOUT CONTRAST CT MAXILLOFACIAL WITHOUT CONTRAST CT CERVICAL SPINE WITHOUT CONTRAST TECHNIQUE: Multidetector CT imaging of the head, cervical spine, and maxillofacial structures were performed using the standard protocol without intravenous contrast. Multiplanar CT image reconstructions of the cervical spine and maxillofacial structures were also generated. COMPARISON:  None. FINDINGS: CT HEAD FINDINGS Brain: Extensive subarachnoid hemorrhage is present within the right sylvian fissure an over the right convexity. There is hemorrhagic contusion involving the right temporal tip and anterior inferior right frontal lobe along the anterior cranial  fossa. Extensive subdural hematoma is present along the left side of the falx cerebri. Additional subarachnoid hemorrhage is present over the high left frontal convexity. Additional areas of subarachnoid hemorrhage are present over the lateral left temporal lobe. Vascular: No focal hyperdense vessel is present. Skull: There is a frontal process fracture of the right maxilla. The calvarium is otherwise intact. Right zygomatic arch is intact. Please see face CT for further detail of right maxillary sinus fracture. Other: Extensive right periorbital hematoma is noted. CT MAXILLOFACIAL FINDINGS Osseous: A comminuted anterior fractures present in the right maxillary sinus. The right maxillary sinus is filled with blood. The frontal process the right maxillary sinus is displaced medially 2 mm. There is also a comminuted distal right nasal bone fracture. Right zygomatic arch is intact. The mandible is intact and located. Orbits: The globes are intact bilaterally. Extensive right periorbital hematoma is present. No other underlying fracture is evident. Sinuses: Hemorrhage fills the right maxillary sinus. The remaining paranasal sinuses  and the mastoid air cells are clear. Soft tissues: Extensive hemorrhages soft tissue swelling is present over the right side of the face. This continues to the level the mandible. CT CERVICAL SPINE FINDINGS Alignment: Slight anterolisthesis is present at C4-5. AP alignment is otherwise anatomic. Skull base and vertebrae: The craniocervical junction is intact. Soft tissues and spinal canal: The soft tissues the neck demonstrate bilateral carotid bifurcation atherosclerotic disease. The thyroid is atrophic. No significant adenopathy is present. No focal soft tissue injury is present. There is fatty infiltration of the parotid glands bilaterally. Disc levels: Chronic endplate degenerate change and loss of disc height is most evident at C5-6. Uncovertebral spurring contributes to moderate foraminal stenosis bilaterally at this level. Multilevel advanced facet hypertrophy is present. There is fusion across the facet joints at C3-4 and C4-5 bilaterally. Moderate facet degenerative changes are present at C2-3, left greater than right. Upper chest: The lung apices are clear. Atherosclerotic changes are present at the aortic arch without significant aneurysm or stenosis. IMPRESSION: 1. Extensive subarachnoid hemorrhage throughout the right sylvian fissure in lateral left temporal lobe. 2. Hemorrhagic contusion involving the anterior right temporal tip and anterior frontal lobes, right greater than left. 3. Subdural blood along the left side of the falx cerebri. 4. Large right periorbital hematoma with extensive inflammatory changes extending into the right side of the face. 5. Comminuted anterior maxillary sinus fracture with blood throughout the right maxillary sinus. 6. Minimally displaced frontal process maxillary fracture. 7. Minimally displaced anterior right nasal bone fractures. 8. The mandible is intact. 9. No acute abnormality of the cervical spine. 10. Degenerative changes of the cervical spine are most pronounced  at C5-6. These results were called by telephone at the time of interpretation on 09/09/2016 at 2:47 pm to Dr. Wyvonnia Dusky , who verbally acknowledged these results. Electronically Signed   By: San Morelle M.D.   On: 09/09/2016 14:48    Procedures Procedures (including critical care time)  CRITICAL CARE Performed by: Monico Blitz   Total critical care time: 40 minutes  Critical care time was exclusive of separately billable procedures and treating other patients.  Critical care was necessary to treat or prevent imminent or life-threatening deterioration.  Critical care was time spent personally by me on the following activities: development of treatment plan with patient and/or surrogate as well as nursing, discussions with consultants, evaluation of patient's response to treatment, examination of patient, obtaining history from patient or surrogate, ordering and performing treatments and interventions, ordering and review of  laboratory studies, ordering and review of radiographic studies, pulse oximetry and re-evaluation of patient's condition.   EMERGENCY DEPARTMENT [place] OCULAR EXAM "Study: Limited Ultrasound of Orbit "  INDICATIONS: Traumatic  Linear probe utilized to obtain images in both long and short axis of the orbit having the patient look left and right if possible.  PERFORMED BY: Myself  IMAGES ARCHIVED?: Yes  LIMITATIONS: none  VIEWS USED: Right orbit  INTERPRETATION: No retinal detachment, Lens in proper position    Medications Ordered in ED Medications  0.9 %  sodium chloride infusion (not administered)  bacitracin ointment 1 application (not administered)  ceFAZolin (ANCEF) IVPB 1 g/50 mL premix (not administered)  Tdap (BOOSTRIX) injection 0.5 mL (0.5 mLs Intramuscular Given 09/09/16 1333)  morphine 2 MG/ML injection 2 mg (2 mg Intravenous Given 09/09/16 1531)     Initial Impression / Assessment and Plan / ED Course  I have reviewed the triage  vital signs and the nursing notes.  Pertinent labs & imaging results that were available during my care of the patient were reviewed by me and considered in my medical decision making (see chart for details).  Clinical Course    Vitals:   09/09/16 1332 09/09/16 1445 09/09/16 1500 09/09/16 1515  BP:  164/71 158/93 170/59  Pulse:  71 67 69  Resp:  21 20 19   Temp: 97.4 F (36.3 C)     TempSrc: Oral     SpO2:  99% 99% 100%  Weight:      Height:        Medications  0.9 %  sodium chloride infusion (not administered)  bacitracin ointment 1 application (not administered)  ceFAZolin (ANCEF) IVPB 1 g/50 mL premix (not administered)  Tdap (BOOSTRIX) injection 0.5 mL (0.5 mLs Intramuscular Given 09/09/16 1333)  morphine 2 MG/ML injection 2 mg (2 mg Intravenous Given 09/09/16 1531)    Kristin Shaffer is 82 y.o. female presenting with Drop attacks syncopal event earlier in the day. KG with no arrhythmia, blood work reassuring, troponin is negative. Extensive right-sided facial swelling, eyes swollen shut however we've been able to open the eye and she can see light, bedside ultrasound grossly intact. Patient is refusing pain medication. She takes a low-dose aspirin and Plavix daily. Neurologic exam is nonfocal.  CT with subarachnoid, hemorrhagic contusion, subdural hematoma.   Neurosurgery consult from Dr. Cyndy Freeze appreciated: recommends transfusing 2 units of platelets, service will evaluate and recommends a trauma admission.  Trauma surgery consult from Dr. Redmond Pulling appreciated: will patient to the trauma ICU and evaluate her.  ENT consult from Dr. Wilburn Cornelia appreciated: States it with the injuries it is nonoperable at this time. Will evaluate the patient either today or tomorrow. Given Ancef for open fracture given  . Final Clinical Impressions(s) / ED Diagnoses   Final diagnoses:  Syncope, unspecified syncope type  Subdural hematoma (HCC)  Subarachnoid hemorrhage (HCC)  Closed fracture  of maxillary sinus, sequela [facility])    New Prescriptions New Prescriptions   No medications on file     Monico Blitz, PA-C 09/09/16 [place], MD 09/09/16 1701

## 2016-09-10 ENCOUNTER — Inpatient Hospital Stay (HOSPITAL_COMMUNITY): Payer: Medicare Other

## 2016-09-10 ENCOUNTER — Encounter (HOSPITAL_COMMUNITY): Payer: Self-pay | Admitting: *Deleted

## 2016-09-10 DIAGNOSIS — R55 Syncope and collapse: Secondary | ICD-10-CM

## 2016-09-10 DIAGNOSIS — I6200 Nontraumatic subdural hemorrhage, unspecified: Secondary | ICD-10-CM

## 2016-09-10 LAB — BASIC METABOLIC PANEL
Anion gap: 9 (ref 5–15)
Anion gap: 9 (ref 5–15)
BUN: 17 mg/dL (ref 6–20)
BUN: 15 mg/dL (ref 6–20)
CHLORIDE: 107 mmol/L (ref 101–111)
CHLORIDE: 108 mmol/L (ref 101–111)
CO2: 22 mmol/L (ref 22–32)
CO2: 23 mmol/L (ref 22–32)
CREATININE: 0.88 mg/dL (ref 0.44–1.00)
Calcium: 8.8 mg/dL — ABNORMAL LOW (ref 8.9–10.3)
Calcium: 8.9 mg/dL (ref 8.9–10.3)
Creatinine, Ser: 0.83 mg/dL (ref 0.44–1.00)
GFR calc Af Amer: >60 mL/min (ref >60)
GFR calc Af Amer: >60 mL/min (ref >60)
GFR calc non Af Amer: >60 mL/min (ref >60)
GFR calc non Af Amer: 59 mL/min — ABNORMAL LOW (ref >60)
GLUCOSE: 99 mg/dL (ref 65–99)
GLUCOSE: 107 mg/dL — AB (ref 65–99)
POTASSIUM: 5.5 mmol/L — AB (ref 3.5–5.1)
Potassium: 3.9 mmol/L (ref 3.5–5.1)
SODIUM: 140 mmol/L (ref 135–145)
Sodium: 138 mmol/L (ref 135–145)

## 2016-09-10 LAB — CBC
HEMATOCRIT: 35 % — AB (ref 36.0–46.0)
HEMOGLOBIN: 11.6 g/dL — AB (ref 12.0–15.0)
MCH: 30.1 pg (ref 26.0–34.0)
MCHC: 33.1 g/dL (ref 30.0–36.0)
MCV: 90.9 fL (ref 78.0–100.0)
Platelets: 254 10*3/uL (ref 150–400)
RBC: 3.85 MIL/uL — AB (ref 3.87–5.11)
RDW: 13.9 % (ref 11.5–15.5)
WBC: 6.7 10*3/uL (ref 4.0–10.5)

## 2016-09-10 LAB — PREPARE PLATELET PHERESIS
Unit division: 0
Unit division: 0

## 2016-09-10 LAB — TROPONIN I

## 2016-09-10 LAB — APTT: aPTT: 24 seconds (ref 24–36)

## 2016-09-10 LAB — PROTIME-INR
INR: 0.99
PROTHROMBIN TIME: 13.1 s (ref 11.4–15.2)

## 2016-09-10 MED ORDER — SODIUM CHLORIDE 0.9 % IV SOLN: INTRAVENOUS | Status: DC

## 2016-09-10 MED ORDER — ALUM & MAG HYDROXIDE-SIMETH 200-200-20 MG/5ML PO SUSP
30.0000 mL | ORAL | Status: DC | PRN
  Administered 2016-09-10: 30 mL via ORAL
  Filled 2016-09-10: qty 30

## 2016-09-10 NOTE — Progress Notes (Signed)
Trauma Service Note  Subjective: No new pains, some aches over face and back  Objective: Vital signs in last 24 hours: Temp:  [97.4 F (36.3 C)-98.5 F (36.9 C)] 98.1 F (36.7 C) (10/15 0400) Pulse Rate:  [63-81] 74 (10/15 0700) Resp:  [14-27] 17 (10/15 0700) BP: (121-170)/(47-104) 157/52 (10/15 0700) SpO2:  [92 %-100 %] 94 % (10/15 0700) Weight:  [81.2 kg (179 lb)] 81.2 kg (179 lb) (10/14 1236) Last BM Date: 09/08/16  Intake/Output from previous day: 10/14 0701 - 10/15 0700 In: 1217.5 [P.O.:240; I.V.:412.5; Blood:515; IV Piggyback:50] Out: [id number] [Urine:1050] Intake/Output this shift: No intake/output data recorded.  General: NAD  Lungs: CTAB  Cards: S1 S2, NSR on monitor  Abd: soft  Extremities: no edema  Neuro: AOx4  Face: ecchymosis right eyelides and left eyelids  Lab Results: CBC   Recent Labs  09/09/16 1230 09/10/16 0233  WBC 7.6 6.7  HGB 14.6 11.6*  HCT 43.1 35.0*  PLT 201 254   BMET  Recent Labs  09/09/16 1230 09/10/16 0233  NA 138 138  K 4.4 5.5*  CL 106 107  CO2 22 22  GLUCOSE 95 99  BUN 19 17  CREATININE 0.98 0.83  CALCIUM 9.8 8.8*   PT/INR  Recent Labs  09/09/16 1230 09/10/16 0233  LABPROT 12.6 13.1  INR 0.95 0.99   ABG No results for input(s): PHART, HCO3 in the last 72 hours.  Invalid input(s): PCO2, PO2  Studies/Results: Ct Head Wo Contrast  Result Date: 09/10/2016 CLINICAL DATA:  Intracranial hemorrhage.  Trauma yesterday. EXAM: CT HEAD WITHOUT CONTRAST TECHNIQUE: Contiguous axial images were obtained from the base of the skull through the vertex without intravenous contrast. COMPARISON:  CT head without contrast 09/09/2016 FINDINGS: Brain: Extensive subarachnoid and subdural hemorrhage is again seen. Subdural blood is evident along the falx and over the left convexity. There is blooming of a hemorrhagic contusion in the anterior right frontal lobe which now measures 3 cm maximally. This creates local mass effect with  surrounding edema but no midline shift. Extensive subarachnoid hemorrhage is present in the right sylvian fissure and over the convexity bilaterally. Additional hemorrhagic contusion is present along the anterior inferior frontal lobes bilaterally, right greater than left. Subarachnoid blood is again seen along the left temporal lobe. No new areas of hemorrhage are present. Vascular: Atherosclerotic calcifications are again noted within the cavernous internal carotid arteries bilaterally. There is no hyperdense vessel. Skull: The calvarium is intact. Sinuses/Orbits: Right maxillary sinus fracture, anterior process fracture, and nasal bone fractures are again seen. No new fractures are present. Hemorrhage within the right maxillary sinus is slightly decreased. The remaining paranasal sinuses and the mastoid air cells are clear. Other: Extensive right periorbital hematoma is again noted. Diffuse soft tissue swelling extends over the right side of the face. IMPRESSION: 1. Blooming of hemorrhagic contusion in the anterior right frontal lobe now measuring up to 3 cm. Although there is some surrounding vasogenic edema, there is no midline shift associated with this hemorrhage. 2. Diffuse subarachnoid and subdural hemorrhage is otherwise stable. 3. No new areas of hemorrhage. 4. Right maxillary fractures are stable. 5. Slight decrease and right maxillary hemo sinus. 6. Extensive hemorrhage and soft tissue swelling surrounding the right orbit and over the right face. Electronically Signed   By: San Morelle M.D.   On: 09/10/2016 07:25   Ct Head Wo Contrast  Result Date: 09/09/2016 CLINICAL DATA:  Syncopal episode. Trauma to head. Right periorbital hematoma. Laceration to the right side  of the face and head, and upper lip EXAM: CT HEAD WITHOUT CONTRAST CT MAXILLOFACIAL WITHOUT CONTRAST CT CERVICAL SPINE WITHOUT CONTRAST TECHNIQUE: Multidetector CT imaging of the head, cervical spine, and maxillofacial structures  were performed using the standard protocol without intravenous contrast. Multiplanar CT image reconstructions of the cervical spine and maxillofacial structures were also generated. COMPARISON:  None. FINDINGS: CT HEAD FINDINGS Brain: Extensive subarachnoid hemorrhage is present within the right sylvian fissure an over the right convexity. There is hemorrhagic contusion involving the right temporal tip and anterior inferior right frontal lobe along the anterior cranial fossa. Extensive subdural hematoma is present along the left side of the falx cerebri. Additional subarachnoid hemorrhage is present over the high left frontal convexity. Additional areas of subarachnoid hemorrhage are present over the lateral left temporal lobe. Vascular: No focal hyperdense vessel is present. Skull: There is a frontal process fracture of the right maxilla. The calvarium is otherwise intact. Right zygomatic arch is intact. Please see face CT for further detail of right maxillary sinus fracture. Other: Extensive right periorbital hematoma is noted. CT MAXILLOFACIAL FINDINGS Osseous: A comminuted anterior fractures present in the right maxillary sinus. The right maxillary sinus is filled with blood. The frontal process the right maxillary sinus is displaced medially 2 mm. There is also a comminuted distal right nasal bone fracture. Right zygomatic arch is intact. The mandible is intact and located. Orbits: The globes are intact bilaterally. Extensive right periorbital hematoma is present. No other underlying fracture is evident. Sinuses: Hemorrhage fills the right maxillary sinus. The remaining paranasal sinuses and the mastoid air cells are clear. Soft tissues: Extensive hemorrhages soft tissue swelling is present over the right side of the face. This continues to the level the mandible. CT CERVICAL SPINE FINDINGS Alignment: Slight anterolisthesis is present at C4-5. AP alignment is otherwise anatomic. Skull base and vertebrae: The  craniocervical junction is intact. Soft tissues and spinal canal: The soft tissues the neck demonstrate bilateral carotid bifurcation atherosclerotic disease. The thyroid is atrophic. No significant adenopathy is present. No focal soft tissue injury is present. There is fatty infiltration of the parotid glands bilaterally. Disc levels: Chronic endplate degenerate change and loss of disc height is most evident at C5-6. Uncovertebral spurring contributes to moderate foraminal stenosis bilaterally at this level. Multilevel advanced facet hypertrophy is present. There is fusion across the facet joints at C3-4 and C4-5 bilaterally. Moderate facet degenerative changes are present at C2-3, left greater than right. Upper chest: The lung apices are clear. Atherosclerotic changes are present at the aortic arch without significant aneurysm or stenosis. IMPRESSION: 1. Extensive subarachnoid hemorrhage throughout the right sylvian fissure in lateral left temporal lobe. 2. Hemorrhagic contusion involving the anterior right temporal tip and anterior frontal lobes, right greater than left. 3. Subdural blood along the left side of the falx cerebri. 4. Large right periorbital hematoma with extensive inflammatory changes extending into the right side of the face. 5. Comminuted anterior maxillary sinus fracture with blood throughout the right maxillary sinus. 6. Minimally displaced frontal process maxillary fracture. 7. Minimally displaced anterior right nasal bone fractures. 8. The mandible is intact. 9. No acute abnormality of the cervical spine. 10. Degenerative changes of the cervical spine are most pronounced at C5-6. These results were called by telephone at the time of interpretation on 09/09/2016 at 2:47 pm to Dr. Wyvonnia Dusky , who verbally acknowledged these results. Electronically Signed   By: San Morelle M.D.   On: 09/09/2016 14:48   Ct Cervical Spine  Wo Contrast  Result Date: 09/09/2016 CLINICAL DATA:  Syncopal  episode. Trauma to head. Right periorbital hematoma. Laceration to the right side of the face and head, and upper lip EXAM: CT HEAD WITHOUT CONTRAST CT MAXILLOFACIAL WITHOUT CONTRAST CT CERVICAL SPINE WITHOUT CONTRAST TECHNIQUE: Multidetector CT imaging of the head, cervical spine, and maxillofacial structures were performed using the standard protocol without intravenous contrast. Multiplanar CT image reconstructions of the cervical spine and maxillofacial structures were also generated. COMPARISON:  None. FINDINGS: CT HEAD FINDINGS Brain: Extensive subarachnoid hemorrhage is present within the right sylvian fissure an over the right convexity. There is hemorrhagic contusion involving the right temporal tip and anterior inferior right frontal lobe along the anterior cranial fossa. Extensive subdural hematoma is present along the left side of the falx cerebri. Additional subarachnoid hemorrhage is present over the high left frontal convexity. Additional areas of subarachnoid hemorrhage are present over the lateral left temporal lobe. Vascular: No focal hyperdense vessel is present. Skull: There is a frontal process fracture of the right maxilla. The calvarium is otherwise intact. Right zygomatic arch is intact. Please see face CT for further detail of right maxillary sinus fracture. Other: Extensive right periorbital hematoma is noted. CT MAXILLOFACIAL FINDINGS Osseous: A comminuted anterior fractures present in the right maxillary sinus. The right maxillary sinus is filled with blood. The frontal process the right maxillary sinus is displaced medially 2 mm. There is also a comminuted distal right nasal bone fracture. Right zygomatic arch is intact. The mandible is intact and located. Orbits: The globes are intact bilaterally. Extensive right periorbital hematoma is present. No other underlying fracture is evident. Sinuses: Hemorrhage fills the right maxillary sinus. The remaining paranasal sinuses and the mastoid  air cells are clear. Soft tissues: Extensive hemorrhages soft tissue swelling is present over the right side of the face. This continues to the level the mandible. CT CERVICAL SPINE FINDINGS Alignment: Slight anterolisthesis is present at C4-5. AP alignment is otherwise anatomic. Skull base and vertebrae: The craniocervical junction is intact. Soft tissues and spinal canal: The soft tissues the neck demonstrate bilateral carotid bifurcation atherosclerotic disease. The thyroid is atrophic. No significant adenopathy is present. No focal soft tissue injury is present. There is fatty infiltration of the parotid glands bilaterally. Disc levels: Chronic endplate degenerate change and loss of disc height is most evident at C5-6. Uncovertebral spurring contributes to moderate foraminal stenosis bilaterally at this level. Multilevel advanced facet hypertrophy is present. There is fusion across the facet joints at C3-4 and C4-5 bilaterally. Moderate facet degenerative changes are present at C2-3, left greater than right. Upper chest: The lung apices are clear. Atherosclerotic changes are present at the aortic arch without significant aneurysm or stenosis. IMPRESSION: 1. Extensive subarachnoid hemorrhage throughout the right sylvian fissure in lateral left temporal lobe. 2. Hemorrhagic contusion involving the anterior right temporal tip and anterior frontal lobes, right greater than left. 3. Subdural blood along the left side of the falx cerebri. 4. Large right periorbital hematoma with extensive inflammatory changes extending into the right side of the face. 5. Comminuted anterior maxillary sinus fracture with blood throughout the right maxillary sinus. 6. Minimally displaced frontal process maxillary fracture. 7. Minimally displaced anterior right nasal bone fractures. 8. The mandible is intact. 9. No acute abnormality of the cervical spine. 10. Degenerative changes of the cervical spine are most pronounced at C5-6. These  results were called by telephone at the time of interpretation on 09/09/2016 at 2:47 pm to Dr. Wyvonnia Dusky , who  verbally acknowledged these results. Electronically Signed   By: San Morelle M.D.   On: 09/09/2016 14:48   Dg Chest Port 1 View  Result Date: 09/09/2016 CLINICAL DATA:  Fall today with multiple facial fractures and intracranial hemorrhage. EXAM: PORTABLE CHEST 1 VIEW COMPARISON:  Chest CT 04/21/2016 FINDINGS: Normal heart size. Mild tortuosity and atherosclerosis of the thoracic aorta. The lungs are clear. Pulmonary vasculature is normal. No consolidation, pleural effusion, or pneumothorax. No acute osseous abnormalities are seen. IMPRESSION: No acute abnormality. Thoracic aortic atherosclerosis. Electronically Signed   By: Jeb Levering M.D.   On: 09/09/2016 22:08   Ct Maxillofacial Wo Contrast  Result Date: 09/09/2016 CLINICAL DATA:  Syncopal episode. Trauma to head. Right periorbital hematoma. Laceration to the right side of the face and head, and upper lip EXAM: CT HEAD WITHOUT CONTRAST CT MAXILLOFACIAL WITHOUT CONTRAST CT CERVICAL SPINE WITHOUT CONTRAST TECHNIQUE: Multidetector CT imaging of the head, cervical spine, and maxillofacial structures were performed using the standard protocol without intravenous contrast. Multiplanar CT image reconstructions of the cervical spine and maxillofacial structures were also generated. COMPARISON:  None. FINDINGS: CT HEAD FINDINGS Brain: Extensive subarachnoid hemorrhage is present within the right sylvian fissure an over the right convexity. There is hemorrhagic contusion involving the right temporal tip and anterior inferior right frontal lobe along the anterior cranial fossa. Extensive subdural hematoma is present along the left side of the falx cerebri. Additional subarachnoid hemorrhage is present over the high left frontal convexity. Additional areas of subarachnoid hemorrhage are present over the lateral left temporal lobe. Vascular: No  focal hyperdense vessel is present. Skull: There is a frontal process fracture of the right maxilla. The calvarium is otherwise intact. Right zygomatic arch is intact. Please see face CT for further detail of right maxillary sinus fracture. Other: Extensive right periorbital hematoma is noted. CT MAXILLOFACIAL FINDINGS Osseous: A comminuted anterior fractures present in the right maxillary sinus. The right maxillary sinus is filled with blood. The frontal process the right maxillary sinus is displaced medially 2 mm. There is also a comminuted distal right nasal bone fracture. Right zygomatic arch is intact. The mandible is intact and located. Orbits: The globes are intact bilaterally. Extensive right periorbital hematoma is present. No other underlying fracture is evident. Sinuses: Hemorrhage fills the right maxillary sinus. The remaining paranasal sinuses and the mastoid air cells are clear. Soft tissues: Extensive hemorrhages soft tissue swelling is present over the right side of the face. This continues to the level the mandible. CT CERVICAL SPINE FINDINGS Alignment: Slight anterolisthesis is present at C4-5. AP alignment is otherwise anatomic. Skull base and vertebrae: The craniocervical junction is intact. Soft tissues and spinal canal: The soft tissues the neck demonstrate bilateral carotid bifurcation atherosclerotic disease. The thyroid is atrophic. No significant adenopathy is present. No focal soft tissue injury is present. There is fatty infiltration of the parotid glands bilaterally. Disc levels: Chronic endplate degenerate change and loss of disc height is most evident at C5-6. Uncovertebral spurring contributes to moderate foraminal stenosis bilaterally at this level. Multilevel advanced facet hypertrophy is present. There is fusion across the facet joints at C3-4 and C4-5 bilaterally. Moderate facet degenerative changes are present at C2-3, left greater than right. Upper chest: The lung apices are  clear. Atherosclerotic changes are present at the aortic arch without significant aneurysm or stenosis. IMPRESSION: 1. Extensive subarachnoid hemorrhage throughout the right sylvian fissure in lateral left temporal lobe. 2. Hemorrhagic contusion involving the anterior right temporal tip and anterior frontal  lobes, right greater than left. 3. Subdural blood along the left side of the falx cerebri. 4. Large right periorbital hematoma with extensive inflammatory changes extending into the right side of the face. 5. Comminuted anterior maxillary sinus fracture with blood throughout the right maxillary sinus. 6. Minimally displaced frontal process maxillary fracture. 7. Minimally displaced anterior right nasal bone fractures. 8. The mandible is intact. 9. No acute abnormality of the cervical spine. 10. Degenerative changes of the cervical spine are most pronounced at C5-6. These results were called by telephone at the time of interpretation on 09/09/2016 at 2:47 pm to Dr. Wyvonnia Dusky , who verbally acknowledged these results. Electronically Signed   By: San Morelle M.D.   On: 09/09/2016 14:48    Anti-infectives: Anti-infectives    Start     Dose/Rate Route Frequency Ordered Stop   09/09/16 1545  ceFAZolin (ANCEF) IVPB 1 g/50 mL premix     1 g 100 mL/hr over 30 Minutes Intravenous  Once 09/09/16 1544 09/09/16 1820      Medications Scheduled Meds: . atorvastatin  20 mg Oral Daily  . bacitracin   Topical BID  . docusate sodium  100 mg Oral BID  . ezetimibe  5 mg Oral Daily  . levothyroxine  100 mcg Oral QAC breakfast  . lisinopril  10 mg Oral Daily  . metoprolol tartrate  12.5 mg Oral BID  . pantoprazole  40 mg Oral Daily   Continuous Infusions: . sodium chloride     PRN Meds:.acetaminophen, cyclobenzaprine, morphine injection, nitroGLYCERIN, ondansetron **OR** ondansetron (ZOFRAN) IV  Assessment/Plan: 82 yo female syncope while volunteering SDH, SAH, IPH - on asa at home, given 2 units  plt overnight, repeat CT scan expected evolution, f/u Dr. Cyndy Freeze recs Syncope - cardiology following, on bb, TTE today, continue heart monitoring Max sinus, max frontal process, nasal bone fx - nonop, f/u Valetta Mole as outpatient FEN - NPO until consults complete and TTE done Dispo - can likely be stepdown once NSG evaluates and agrees   LOS: 1 day   [place] Surgeon [phone] Surgery 09/10/2016

## 2016-09-10 NOTE — Discharge Instructions (Signed)
Fracture precautions: 1. Elevate head of bed 2. Ice compress to periorbital region, d/c after 48hrs 3. Avoid additional trauma, nose blowing or sneezing 4. Liquid and soft diet as tolerated 5. Saline nasal spray 4 times a day and when necessary

## 2016-09-10 NOTE — Consult Note (Signed)
CC:  Chief Complaint  Patient presents with  . Fall  . Head Injury  . Facial Swelling    HPI: Kristin Shaffer is a 82 y.o. female s/p fall while at a church function.  She fell and struck her head.  She denies headache or neck pain.  She complains of right arm pain from where she fell on that arm.  She is on plavix because of a cardiac stent from 2014.  PMH: Past Medical History:  Diagnosis Date  . Anxiety   . Arthritis    "different places; not bad" (03/20/2013)  . Coronary artery disease   . Exertional shortness of breath   . GERD (gastroesophageal reflux disease)   . High cholesterol   . Hypertension   . Hypothyroidism   . Myocardial infarction    "Dr's saw evidence I might have had a heart attack" (03/20/2013)  . NSTEMI (non-ST elevated myocardial infarction) ([place]) 03/20/2013   cath - mid RCA  . Pneumonia 2012  . Skin cancer    "both legs; right arm" (03/20/2013)    PSH: Past Surgical History:  Procedure Laterality Date  . APPENDECTOMY  2003  . BREAST BIOPSY Bilateral 1990s   "total of 5; 2 on one side, 3 on the other; all benign" (03/20/2013)  . CATARACT EXTRACTION W/ INTRAOCULAR LENS  IMPLANT, BILATERAL Bilateral ~ 2008  . CORONARY ANGIOPLASTY  12/08/2013  . CORONARY ANGIOPLASTY WITH STENT PLACEMENT  03/20/2013   NSTEMI - subtotal occlusion of mid RCA - PCI of mid RCA of long tubular 40-60% lesion - 2 tandem 95-99% subtotal occlusions - 3 overlapping Xience Xpedition DES (Dr. Roni Bread)   . [place] OF UTERUS  1956?  Marland Kitchen LEFT HEART CATHETERIZATION WITH CORONARY ANGIOGRAM N/A 03/20/2013   Procedure: LEFT HEART CATHETERIZATION WITH CORONARY ANGIOGRAM;  Surgeon: Sanda Klein, MD;  Location: [facility] CATH LAB;  Service: Cardiovascular;  Laterality: N/A;  . LEFT HEART CATHETERIZATION WITH CORONARY ANGIOGRAM N/A 12/08/2013   Procedure: LEFT HEART CATHETERIZATION WITH CORONARY ANGIOGRAM;  Surgeon: Blane Ohara, MD;  Location: [facility] CATH LAB;  Service: Cardiovascular;   Laterality: N/A;  . SKIN CANCER EXCISION     "1 off right arm; 2 off each leg" (03/20/2013)  . TRANSTHORACIC ECHOCARDIOGRAM  03/2013   EF 123456, grade 1 diastolic dysfunction; mild MR; calcifed MV annulus; LA mildly dilated    SH: Social History  Substance Use Topics  . Smoking status: Never Smoker  . Smokeless tobacco: Never Used  . Alcohol use No    MEDS: Prior to Admission medications   Medication Sig Start Date End Date Taking? Authorizing Provider  aspirin EC 81 MG tablet Take 81 mg by mouth daily.   Yes Historical Provider, MD  atorvastatin (LIPITOR) 20 MG tablet Take 1 tablet (20 mg total) by mouth daily. 08/31/16  Yes Pixie Casino, MD  Ca Carbonate-Mag Hydroxide (ROLAIDS) 550-110 MG CHEW Chew 1 tablet by mouth daily as needed (heartburn).    Yes Historical Provider, MD  CALCIUM-VITAMIN D PO Take 1 tablet by mouth daily.   Yes Historical Provider, MD  clopidogrel (PLAVIX) 75 MG tablet TAKE 1 TABLET EVERY DAY 08/15/16  Yes Pixie Casino, MD  cyclobenzaprine (FLEXERIL) 5 MG tablet Take 1 tablet (5 mg total) by mouth 2 (two) times daily as needed for muscle spasms. 09/15/15  Yes Pixie Casino, MD  ezetimibe (ZETIA) 10 MG tablet Take 5 mg by mouth daily.   Yes Historical Provider, MD  fenofibrate 54  MG tablet Take 1 tablet (54 mg total) by mouth daily. 10/05/15  Yes Pixie Casino, MD  ibuprofen (ADVIL,MOTRIN) 200 MG tablet Take 200 mg by mouth every 6 (six) hours as needed for moderate pain.   Yes Historical Provider, MD  levothyroxine (SYNTHROID, LEVOTHROID) 100 MCG tablet Take 100 mcg by mouth daily before breakfast.   Yes Historical Provider, MD  lisinopril (PRINIVIL,ZESTRIL) 10 MG tablet TAKE 1 TABLET BY MOUTH EVERY DAY 06/18/14  Yes Pixie Casino, MD  Magnesium Oxide 250 MG TABS Take 1 tablet by mouth daily.   Yes Historical Provider, MD  meloxicam (MOBIC) 7.5 MG tablet Take 1 tablet (7.5 mg total) by mouth daily. 06/30/16  Yes Tanna Furry, MD  metoprolol tartrate  (LOPRESSOR) 25 MG tablet TAKE 1/2 TABLET TWICE DAILY 08/23/16  Yes Pixie Casino, MD  nitroGLYCERIN (NITROSTAT) 0.4 MG SL tablet Place 1 tablet (0.4 mg total) under the tongue every 5 (five) minutes as needed for chest pain. 09/15/15  Yes Pixie Casino, MD  potassium chloride SA (K-DUR,KLOR-CON) 20 MEQ tablet Take 1 tablet (20 mEq total) by mouth daily. 05/20/13  Yes Pixie Casino, MD  HYDROcodone-acetaminophen (NORCO/VICODIN) 5-325 MG tablet Take 1 tablet by mouth every 4 (four) hours as needed. Patient not taking: Reported on 09/09/2016 06/30/16   Tanna Furry, MD  methocarbamol (ROBAXIN) 500 MG tablet Take 1 tablet (500 mg total) by mouth 3 (three) times daily between meals as needed. Patient not taking: Reported on 09/09/2016 06/30/16   Tanna Furry, MD    ALLERGY: Allergies  Allergen Reactions  . Atorvastatin Other (See Comments)    Leg weakness and cramps  . Hydrocodone-Acetaminophen Nausea And Vomiting  . Omeprazole Other (See Comments)    Made acid reflux worse  . Zocor [Simvastatin] Other (See Comments)    Trouble Walking, Leg weakness and cramping  . Latex Hives and Rash    ROS: ROS  NEUROLOGIC EXAM: Awake, alert, oriented Memory and concentration grossly intact Speech fluent, appropriate CN grossly intact Motor exam: Upper Extremities Deltoid Bicep Tricep Grip  Right 5/5 5/5 5/5 5/5  Left 5/5 5/5 5/5 5/5   Lower Extremity IP Quad PF DF EHL  Right 5/5 5/5 5/5 5/5 5/5  Left 5/5 5/5 5/5 5/5 5/5   Sensation grossly intact to LT  IMAGING: CT Head: Diffuse subarachnoid hemorrhage, right frontal contusion.  Right frontal contusion expanded as expected on repeat imaging. CT CSP: No acute injury  IMPRESSION: - 82 y.o. female with mild traumatic brain injury.  Neuro intact.  Imaging progressing as expected.  PLAN: - May resume aspirin in 1 week, plavix in 2 - Follow up with me in 4 weeks with non-contrast head CT - Feel free to call with questions

## 2016-09-10 NOTE — Evaluation (Signed)
Physical Therapy Evaluation Patient Details Name: Kristin Shaffer MRN: [id number] DOB: [birth year] Today's Date: 09/10/2016   History of Present Illness  82 y.o. female with a PMH significant for CAD s/p NSTEMI with DESx2 to the RCA with later ISR, HTN, dyslipidemia, PHTN, and pulmonary fibrosis who is being admitted to the ICU following a syncopal episode resulting in Lt temporal SDH/SAH and Rt maxillofacial fractures. She had no prodromal signs prior to syncope. Cardiology feels syncope due to arrhythmia.    Clinical Impression  Pt admitted with above diagnosis. Pt currently with functional limitations due to the deficits listed below (see PT Problem List).  Pt will benefit from skilled PT to increase their independence and safety with mobility to allow discharge to the venue listed below.  Son plans to stay with pt on discharge. Will complete further education with use of RW and stair training 10/16 prior to discharge.      Follow Up Recommendations Home health PT;Supervision/Assistance - 24 hour (son to provide)    Equipment Recommendations  Rolling walker with 5" wheels;3in1 (PT)    Recommendations for Other Services OT consult     Precautions / Restrictions Precautions Precautions: Fall Precaution Comments: syncope without warning      Mobility  Bed Mobility                  Transfers Overall transfer level: Needs assistance Equipment used: None Transfers: Sit to/from Stand Sit to Stand: Min guard         General transfer comment: using armrests of chair; close guard for safety (no imbalance); reports difficulty standing from low surfaces due to Rt hip/thigh pain  Ambulation/Gait Ambulation/Gait assistance: Min assist Ambulation Distance (Feet): 150 Feet (Rt HHA; 150 with RW) Assistive device: Rolling walker (2 wheeled);1 person hand held assist Gait Pattern/deviations: Step-through pattern;Decreased stride length;Decreased weight shift to right   Gait  velocity interpretation: at or above normal speed for age/gender General Gait Details: less antalgic gait with RW (RW is slightly too tall for pt; with appropriate height anticipate even more improvement in gait)  Stairs            Wheelchair Mobility    Modified Rankin (Stroke Patients Only)       Balance Overall balance assessment: Needs assistance Sitting-balance support: No upper extremity supported;Feet supported Sitting balance-Leahy Scale: Good     Standing balance support: No upper extremity supported;During functional activity Standing balance-Leahy Scale: Fair                               Pertinent Vitals/Pain Pain Assessment: Faces Faces Pain Scale: Hurts even more Pain Location: Rt hip/posterior thigh Pain Descriptors / Indicators: Discomfort;Grimacing;Guarding;Nagging Pain Intervention(s): Limited activity within patient's tolerance;Monitored during session;Repositioned    Home Living Family/patient expects to be discharged to:: Private residence Living Arrangements: Alone Available Help at Discharge: Family;Available 24 hours/day (son and his wife in from out of town) Type of Home: House Home Access: Stairs to enter Entrance Stairs-Rails: Left Entrance Stairs-Number of Steps: 2 Home Layout: Two level;Able to live on main level with bedroom/bathroom Home Equipment: Cane - quad;Grab bars - tub/shower;Cane - single point      Prior Function Level of Independence: Independent with assistive device(s)         Comments: typically no device, but occasionally quad cane due to Rt posterior hip/thigh pain (recent diagnosis spinal stenosis); difficulty rising from low surfaces (toilet)  Hand Dominance        Extremity/Trunk Assessment   Upper Extremity Assessment: Generalized weakness           Lower Extremity Assessment: Generalized weakness      Cervical / Trunk Assessment: Kyphotic  Communication   Communication: No  difficulties  Cognition Arousal/Alertness: Awake/alert Behavior During Therapy: WFL for tasks assessed/performed Overall Cognitive Status: Within Functional Limits for tasks assessed                      General Comments      Exercises     Assessment/Plan    PT Assessment Patient needs continued PT services  PT Problem List Decreased strength;Decreased activity tolerance;Decreased balance;Decreased mobility;Decreased knowledge of use of DME;Obesity;Pain          PT Treatment Interventions DME instruction;Gait training;Stair training;Functional mobility training;Therapeutic activities;Therapeutic exercise;Balance training;Patient/family education    PT Goals (Current goals can be found in the Care Plan section)  Acute Rehab PT Goals Patient Stated Goal: return home tomorrow PT Goal Formulation: With patient Time For Goal Achievement: 09/17/16 Potential to Achieve Goals: Good    Frequency Min 3X/week   Barriers to discharge Decreased caregiver support lives alone    Co-evaluation               End of Session Equipment Utilized During Treatment: Gait belt Activity Tolerance: Patient tolerated treatment well Patient left: in chair;with call bell/phone within reach;with family/visitor present Nurse Communication: Mobility status         Time: 1249-1315 PT Time Calculation (min) (ACUTE ONLY): 26 min   Charges:   PT Evaluation $PT Eval Low Complexity: 1 Procedure PT Treatments $Gait Training: 8-22 mins   PT G Codes:        Sui Kasparek 09-20-16, 1:29 PM Pager [phone]

## 2016-09-10 NOTE — Evaluation (Signed)
Clinical/Bedside Swallow Evaluation Patient Details  Name: Kristin Shaffer MRN: [id number] Date of Birth: [birth year]  Today's Date: 09/10/2016 Time: SLP Start Time (ACUTE ONLY): 1139 SLP Stop Time (ACUTE ONLY): 1209 SLP Time Calculation (min) (ACUTE ONLY): 30 min  Past Medical History:  Past Medical History:  Diagnosis Date  . Anxiety   . Arthritis    "different places; not bad" (03/20/2013)  . Coronary artery disease   . Exertional shortness of breath   . GERD (gastroesophageal reflux disease)   . High cholesterol   . Hypertension   . Hypothyroidism   . Myocardial infarction    "Dr's saw evidence I might have had a heart attack" (03/20/2013)  . NSTEMI (non-ST elevated myocardial infarction) ([place]) 03/20/2013   cath - mid RCA  . Pneumonia 2012  . Skin cancer    "both legs; right arm" (03/20/2013)   Past Surgical History:  Past Surgical History:  Procedure Laterality Date  . APPENDECTOMY  2003  . BREAST BIOPSY Bilateral 1990s   "total of 5; 2 on one side, 3 on the other; all benign" (03/20/2013)  . CATARACT EXTRACTION W/ INTRAOCULAR LENS  IMPLANT, BILATERAL Bilateral ~ 2008  . CORONARY ANGIOPLASTY  12/08/2013  . CORONARY ANGIOPLASTY WITH STENT PLACEMENT  03/20/2013   NSTEMI - subtotal occlusion of mid RCA - PCI of mid RCA of long tubular 40-60% lesion - 2 tandem 95-99% subtotal occlusions - 3 overlapping Xience Xpedition DES (Dr. Roni Bread)   . [place] OF UTERUS  1956?  Marland Kitchen LEFT HEART CATHETERIZATION WITH CORONARY ANGIOGRAM N/A 03/20/2013   Procedure: LEFT HEART CATHETERIZATION WITH CORONARY ANGIOGRAM;  Surgeon: Sanda Klein, MD;  Location: [facility] CATH LAB;  Service: Cardiovascular;  Laterality: N/A;  . LEFT HEART CATHETERIZATION WITH CORONARY ANGIOGRAM N/A 12/08/2013   Procedure: LEFT HEART CATHETERIZATION WITH CORONARY ANGIOGRAM;  Surgeon: Blane Ohara, MD;  Location: [facility] CATH LAB;  Service: Cardiovascular;  Laterality: N/A;  . SKIN CANCER EXCISION     "1 off right  arm; 2 off each leg" (03/20/2013)  . TRANSTHORACIC ECHOCARDIOGRAM  03/2013   EF 123456, grade 1 diastolic dysfunction; mild MR; calcifed MV annulus; LA mildly dilated   HPI:  82 yo adm to [facility] after fall- syncopal episode.  Pt found to have sinus fractures, mandible intact, right maxillary fxs stable.  Per brain imaging studies, pt with vasogenic edema but no midline shift with right frontal lobe hemorrhagic contusion blooming to 3 cm with diffuse SAH.  Dr Redmond Pulling order swallow evaluation.  Pt denies difficulty tolerating clears and order has been placed to advance to regular.     Assessment / Plan / Recommendation Clinical Impression  Pt presents with edema and contusions impacting right facial movement with eye nearly closed due to edema.  She is also noted to have right neck edema which hopefully is not impacting swallow.  Pt denies sensation of residuals in pharynx, coughing with intake nor oral pocketing and states swallowing is at baseline.  Advised pt to masticate on left side due to fractures and reviewed symptoms of dysphagia.   SLP to sign off.  Thanks    Aspiration Risk  Mild aspiration risk    Diet Recommendation Regular;Thin liquid (prefer softer foods )   Liquid Administration via: Cup;Straw Medication Administration: Whole meds with liquid Supervision: Patient able to self feed Compensations: Slow rate;Small sips/bites    Other  Recommendations Oral Care Recommendations: Oral care BID   Follow up Recommendations  Frequency and Duration            Prognosis        Swallow Study   General Date of Onset: 09/10/16 HPI: 82 yo adm [facility] after fall- syncopal episode.  Pt found to have sinus fractures, mandible intact, right maxillary fxs stable.  Per brain imaging studies, pt with vasogenic edema but no midline shift with right frontal lobe hemorrhagic contusion blooming to 3 cm with diffuse SAH.  Dr Redmond Pulling order swallow evaluation.  Pt denies difficulty tolerating  clears and order has been placed to advance to regular.   Type of Study: Bedside Swallow Evaluation Diet Prior to this Study: Regular;Thin liquids (was clears) Temperature Spikes Noted: No Respiratory Status: Room air History of Recent Intubation: No Behavior/Cognition: Alert;Cooperative;Pleasant mood Oral Cavity Assessment:  (edema and contusion right buccal cavity and right facial, cervical region) Oral Care Completed by SLP: No Oral Cavity - Dentition: Adequate natural dentition Vision: Functional for self-feeding Self-Feeding Abilities: Able to feed self Patient Positioning: Upright in bed Baseline Vocal Quality: Normal Volitional Cough: Strong Volitional Swallow: Able to elicit    Oral/Motor/Sensory Function Overall Oral Motor/Sensory Function: Mild impairment Facial ROM: Reduced right Facial Symmetry: Abnormal symmetry right Facial Strength: Within Functional Limits Facial Sensation: Within Functional Limits Lingual ROM: Within Functional Limits Lingual Symmetry: Within Functional Limits Lingual Strength: Within Functional Limits Lingual Sensation: Within Functional Limits Velum: Within Functional Limits Mandible: Other (Comment) (dnt due to facial fxs, edema with limited ROM )   Ice Chips Ice chips: Not tested   Thin Liquid Thin Liquid: Within functional limits Presentation: Self Fed;Straw    Nectar Thick Nectar Thick Liquid: Not tested   Honey Thick Honey Thick Liquid: Not tested   Puree Puree: Within functional limits Presentation: Self Fed;Spoon   Solid   GO   Solid: Within functional limits Presentation: [place], [place], [place] [facility] SLP [phone]

## 2016-09-10 NOTE — Progress Notes (Signed)
ENT Progress Note: HD #1   Subjective: Improved swelling and pain  Objective: Vital signs in last 24 hours: Temp:  [97.4 F (36.3 C)-98.5 F (36.9 C)] 98.1 F (36.7 C) (10/15 0400) Pulse Rate:  [63-81] 74 (10/15 0700) Resp:  [14-27] 17 (10/15 0700) BP: (121-170)/(47-104) 157/52 (10/15 0700) SpO2:  [92 %-100 %] 94 % (10/15 0700) Weight:  [81.2 kg (179 lb)] 81.2 kg (179 lb) (10/14 1236) Weight change:  Last BM Date: 09/08/16  Intake/Output from previous day: 10/14 0701 - 10/15 0700 In: 1217.5 [P.O.:240; I.V.:412.5; Blood:515; IV Piggyback:50] Out: [id number] [Urine:1050] Intake/Output this shift: No intake/output data recorded.  Labs:  Recent Labs  09/09/16 1230 09/10/16 0233  WBC 7.6 6.7  HGB 14.6 11.6*  HCT 43.1 35.0*  PLT 201 254    Recent Labs  09/09/16 1230 09/10/16 0233  NA 138 138  K 4.4 5.5*  CL 106 107  CO2 22 22  GLUCOSE 95 99  BUN 19 17  CALCIUM 9.8 8.8*    Studies/Results: Ct Head Wo Contrast  Result Date: 09/10/2016 CLINICAL DATA:  Intracranial hemorrhage.  Trauma yesterday. EXAM: CT HEAD WITHOUT CONTRAST TECHNIQUE: Contiguous axial images were obtained from the base of the skull through the vertex without intravenous contrast. COMPARISON:  CT head without contrast 09/09/2016 FINDINGS: Brain: Extensive subarachnoid and subdural hemorrhage is again seen. Subdural blood is evident along the falx and over the left convexity. There is blooming of a hemorrhagic contusion in the anterior right frontal lobe which now measures 3 cm maximally. This creates local mass effect with surrounding edema but no midline shift. Extensive subarachnoid hemorrhage is present in the right sylvian fissure and over the convexity bilaterally. Additional hemorrhagic contusion is present along the anterior inferior frontal lobes bilaterally, right greater than left. Subarachnoid blood is again seen along the left temporal lobe. No new areas of hemorrhage are present. Vascular:  Atherosclerotic calcifications are again noted within the cavernous internal carotid arteries bilaterally. There is no hyperdense vessel. Skull: The calvarium is intact. Sinuses/Orbits: Right maxillary sinus fracture, anterior process fracture, and nasal bone fractures are again seen. No new fractures are present. Hemorrhage within the right maxillary sinus is slightly decreased. The remaining paranasal sinuses and the mastoid air cells are clear. Other: Extensive right periorbital hematoma is again noted. Diffuse soft tissue swelling extends over the right side of the face. IMPRESSION: 1. Blooming of hemorrhagic contusion in the anterior right frontal lobe now measuring up to 3 cm. Although there is some surrounding vasogenic edema, there is no midline shift associated with this hemorrhage. 2. Diffuse subarachnoid and subdural hemorrhage is otherwise stable. 3. No new areas of hemorrhage. 4. Right maxillary fractures are stable. 5. Slight decrease and right maxillary hemo sinus. 6. Extensive hemorrhage and soft tissue swelling surrounding the right orbit and over the right face. Electronically Signed   By: San Morelle M.D.   On: 09/10/2016 07:25   Ct Head Wo Contrast  Result Date: 09/09/2016 CLINICAL DATA:  Syncopal episode. Trauma to head. Right periorbital hematoma. Laceration to the right side of the face and head, and upper lip EXAM: CT HEAD WITHOUT CONTRAST CT MAXILLOFACIAL WITHOUT CONTRAST CT CERVICAL SPINE WITHOUT CONTRAST TECHNIQUE: Multidetector CT imaging of the head, cervical spine, and maxillofacial structures were performed using the standard protocol without intravenous contrast. Multiplanar CT image reconstructions of the cervical spine and maxillofacial structures were also generated. COMPARISON:  None. FINDINGS: CT HEAD FINDINGS Brain: Extensive subarachnoid hemorrhage is present within the right  sylvian fissure an over the right convexity. There is hemorrhagic contusion involving the  right temporal tip and anterior inferior right frontal lobe along the anterior cranial fossa. Extensive subdural hematoma is present along the left side of the falx cerebri. Additional subarachnoid hemorrhage is present over the high left frontal convexity. Additional areas of subarachnoid hemorrhage are present over the lateral left temporal lobe. Vascular: No focal hyperdense vessel is present. Skull: There is a frontal process fracture of the right maxilla. The calvarium is otherwise intact. Right zygomatic arch is intact. Please see face CT for further detail of right maxillary sinus fracture. Other: Extensive right periorbital hematoma is noted. CT MAXILLOFACIAL FINDINGS Osseous: A comminuted anterior fractures present in the right maxillary sinus. The right maxillary sinus is filled with blood. The frontal process the right maxillary sinus is displaced medially 2 mm. There is also a comminuted distal right nasal bone fracture. Right zygomatic arch is intact. The mandible is intact and located. Orbits: The globes are intact bilaterally. Extensive right periorbital hematoma is present. No other underlying fracture is evident. Sinuses: Hemorrhage fills the right maxillary sinus. The remaining paranasal sinuses and the mastoid air cells are clear. Soft tissues: Extensive hemorrhages soft tissue swelling is present over the right side of the face. This continues to the level the mandible. CT CERVICAL SPINE FINDINGS Alignment: Slight anterolisthesis is present at C4-5. AP alignment is otherwise anatomic. Skull base and vertebrae: The craniocervical junction is intact. Soft tissues and spinal canal: The soft tissues the neck demonstrate bilateral carotid bifurcation atherosclerotic disease. The thyroid is atrophic. No significant adenopathy is present. No focal soft tissue injury is present. There is fatty infiltration of the parotid glands bilaterally. Disc levels: Chronic endplate degenerate change and loss of disc  height is most evident at C5-6. Uncovertebral spurring contributes to moderate foraminal stenosis bilaterally at this level. Multilevel advanced facet hypertrophy is present. There is fusion across the facet joints at C3-4 and C4-5 bilaterally. Moderate facet degenerative changes are present at C2-3, left greater than right. Upper chest: The lung apices are clear. Atherosclerotic changes are present at the aortic arch without significant aneurysm or stenosis. IMPRESSION: 1. Extensive subarachnoid hemorrhage throughout the right sylvian fissure in lateral left temporal lobe. 2. Hemorrhagic contusion involving the anterior right temporal tip and anterior frontal lobes, right greater than left. 3. Subdural blood along the left side of the falx cerebri. 4. Large right periorbital hematoma with extensive inflammatory changes extending into the right side of the face. 5. Comminuted anterior maxillary sinus fracture with blood throughout the right maxillary sinus. 6. Minimally displaced frontal process maxillary fracture. 7. Minimally displaced anterior right nasal bone fractures. 8. The mandible is intact. 9. No acute abnormality of the cervical spine. 10. Degenerative changes of the cervical spine are most pronounced at C5-6. These results were called by telephone at the time of interpretation on 09/09/2016 at 2:47 pm to Dr. Wyvonnia Dusky , who verbally acknowledged these results. Electronically Signed   By: San Morelle M.D.   On: 09/09/2016 14:48   Ct Cervical Spine Wo Contrast  Result Date: 09/09/2016 CLINICAL DATA:  Syncopal episode. Trauma to head. Right periorbital hematoma. Laceration to the right side of the face and head, and upper lip EXAM: CT HEAD WITHOUT CONTRAST CT MAXILLOFACIAL WITHOUT CONTRAST CT CERVICAL SPINE WITHOUT CONTRAST TECHNIQUE: Multidetector CT imaging of the head, cervical spine, and maxillofacial structures were performed using the standard protocol without intravenous contrast.  Multiplanar CT image reconstructions of the cervical  spine and maxillofacial structures were also generated. COMPARISON:  None. FINDINGS: CT HEAD FINDINGS Brain: Extensive subarachnoid hemorrhage is present within the right sylvian fissure an over the right convexity. There is hemorrhagic contusion involving the right temporal tip and anterior inferior right frontal lobe along the anterior cranial fossa. Extensive subdural hematoma is present along the left side of the falx cerebri. Additional subarachnoid hemorrhage is present over the high left frontal convexity. Additional areas of subarachnoid hemorrhage are present over the lateral left temporal lobe. Vascular: No focal hyperdense vessel is present. Skull: There is a frontal process fracture of the right maxilla. The calvarium is otherwise intact. Right zygomatic arch is intact. Please see face CT for further detail of right maxillary sinus fracture. Other: Extensive right periorbital hematoma is noted. CT MAXILLOFACIAL FINDINGS Osseous: A comminuted anterior fractures present in the right maxillary sinus. The right maxillary sinus is filled with blood. The frontal process the right maxillary sinus is displaced medially 2 mm. There is also a comminuted distal right nasal bone fracture. Right zygomatic arch is intact. The mandible is intact and located. Orbits: The globes are intact bilaterally. Extensive right periorbital hematoma is present. No other underlying fracture is evident. Sinuses: Hemorrhage fills the right maxillary sinus. The remaining paranasal sinuses and the mastoid air cells are clear. Soft tissues: Extensive hemorrhages soft tissue swelling is present over the right side of the face. This continues to the level the mandible. CT CERVICAL SPINE FINDINGS Alignment: Slight anterolisthesis is present at C4-5. AP alignment is otherwise anatomic. Skull base and vertebrae: The craniocervical junction is intact. Soft tissues and spinal canal: The soft  tissues the neck demonstrate bilateral carotid bifurcation atherosclerotic disease. The thyroid is atrophic. No significant adenopathy is present. No focal soft tissue injury is present. There is fatty infiltration of the parotid glands bilaterally. Disc levels: Chronic endplate degenerate change and loss of disc height is most evident at C5-6. Uncovertebral spurring contributes to moderate foraminal stenosis bilaterally at this level. Multilevel advanced facet hypertrophy is present. There is fusion across the facet joints at C3-4 and C4-5 bilaterally. Moderate facet degenerative changes are present at C2-3, left greater than right. Upper chest: The lung apices are clear. Atherosclerotic changes are present at the aortic arch without significant aneurysm or stenosis. IMPRESSION: 1. Extensive subarachnoid hemorrhage throughout the right sylvian fissure in lateral left temporal lobe. 2. Hemorrhagic contusion involving the anterior right temporal tip and anterior frontal lobes, right greater than left. 3. Subdural blood along the left side of the falx cerebri. 4. Large right periorbital hematoma with extensive inflammatory changes extending into the right side of the face. 5. Comminuted anterior maxillary sinus fracture with blood throughout the right maxillary sinus. 6. Minimally displaced frontal process maxillary fracture. 7. Minimally displaced anterior right nasal bone fractures. 8. The mandible is intact. 9. No acute abnormality of the cervical spine. 10. Degenerative changes of the cervical spine are most pronounced at C5-6. These results were called by telephone at the time of interpretation on 09/09/2016 at 2:47 pm to Dr. Wyvonnia Dusky , who verbally acknowledged these results. Electronically Signed   By: San Morelle M.D.   On: 09/09/2016 14:48   Dg Chest Port 1 View  Result Date: 09/09/2016 CLINICAL DATA:  Fall today with multiple facial fractures and intracranial hemorrhage. EXAM: PORTABLE CHEST 1  VIEW COMPARISON:  Chest CT 04/21/2016 FINDINGS: Normal heart size. Mild tortuosity and atherosclerosis of the thoracic aorta. The lungs are clear. Pulmonary vasculature is normal. No consolidation,  pleural effusion, or pneumothorax. No acute osseous abnormalities are seen. IMPRESSION: No acute abnormality. Thoracic aortic atherosclerosis. Electronically Signed   By: Jeb Levering M.D.   On: 09/09/2016 22:08   Ct Maxillofacial Wo Contrast  Result Date: 09/09/2016 CLINICAL DATA:  Syncopal episode. Trauma to head. Right periorbital hematoma. Laceration to the right side of the face and head, and upper lip EXAM: CT HEAD WITHOUT CONTRAST CT MAXILLOFACIAL WITHOUT CONTRAST CT CERVICAL SPINE WITHOUT CONTRAST TECHNIQUE: Multidetector CT imaging of the head, cervical spine, and maxillofacial structures were performed using the standard protocol without intravenous contrast. Multiplanar CT image reconstructions of the cervical spine and maxillofacial structures were also generated. COMPARISON:  None. FINDINGS: CT HEAD FINDINGS Brain: Extensive subarachnoid hemorrhage is present within the right sylvian fissure an over the right convexity. There is hemorrhagic contusion involving the right temporal tip and anterior inferior right frontal lobe along the anterior cranial fossa. Extensive subdural hematoma is present along the left side of the falx cerebri. Additional subarachnoid hemorrhage is present over the high left frontal convexity. Additional areas of subarachnoid hemorrhage are present over the lateral left temporal lobe. Vascular: No focal hyperdense vessel is present. Skull: There is a frontal process fracture of the right maxilla. The calvarium is otherwise intact. Right zygomatic arch is intact. Please see face CT for further detail of right maxillary sinus fracture. Other: Extensive right periorbital hematoma is noted. CT MAXILLOFACIAL FINDINGS Osseous: A comminuted anterior fractures present in the right  maxillary sinus. The right maxillary sinus is filled with blood. The frontal process the right maxillary sinus is displaced medially 2 mm. There is also a comminuted distal right nasal bone fracture. Right zygomatic arch is intact. The mandible is intact and located. Orbits: The globes are intact bilaterally. Extensive right periorbital hematoma is present. No other underlying fracture is evident. Sinuses: Hemorrhage fills the right maxillary sinus. The remaining paranasal sinuses and the mastoid air cells are clear. Soft tissues: Extensive hemorrhages soft tissue swelling is present over the right side of the face. This continues to the level the mandible. CT CERVICAL SPINE FINDINGS Alignment: Slight anterolisthesis is present at C4-5. AP alignment is otherwise anatomic. Skull base and vertebrae: The craniocervical junction is intact. Soft tissues and spinal canal: The soft tissues the neck demonstrate bilateral carotid bifurcation atherosclerotic disease. The thyroid is atrophic. No significant adenopathy is present. No focal soft tissue injury is present. There is fatty infiltration of the parotid glands bilaterally. Disc levels: Chronic endplate degenerate change and loss of disc height is most evident at C5-6. Uncovertebral spurring contributes to moderate foraminal stenosis bilaterally at this level. Multilevel advanced facet hypertrophy is present. There is fusion across the facet joints at C3-4 and C4-5 bilaterally. Moderate facet degenerative changes are present at C2-3, left greater than right. Upper chest: The lung apices are clear. Atherosclerotic changes are present at the aortic arch without significant aneurysm or stenosis. IMPRESSION: 1. Extensive subarachnoid hemorrhage throughout the right sylvian fissure in lateral left temporal lobe. 2. Hemorrhagic contusion involving the anterior right temporal tip and anterior frontal lobes, right greater than left. 3. Subdural blood along the left side of the  falx cerebri. 4. Large right periorbital hematoma with extensive inflammatory changes extending into the right side of the face. 5. Comminuted anterior maxillary sinus fracture with blood throughout the right maxillary sinus. 6. Minimally displaced frontal process maxillary fracture. 7. Minimally displaced anterior right nasal bone fractures. 8. The mandible is intact. 9. No acute abnormality of the cervical  spine. 10. Degenerative changes of the cervical spine are most pronounced at C5-6. These results were called by telephone at the time of interpretation on 09/09/2016 at 2:47 pm to Dr. Wyvonnia Dusky , who verbally acknowledged these results. Electronically Signed   By: San Morelle M.D.   On: 09/09/2016 14:48     PHYSICAL EXAM: Sig improvement in facial swelling Rt EOMI, vision stable No Bleeding   Assessment/Plan: Cont frx precautions and saline nasal spray Plan f/u as OP ~10/23 to allow for resolving edema      Kristin Shaffer 09/10/2016, 9:34 AM

## 2016-09-10 NOTE — Progress Notes (Signed)
Patient ID: Kristin Shaffer, female   DOB: 03/15/1931, 82 y.o.   MRN: [id number]   SUBJECTIVE: Denies significant pain this morning.  No arrhythmias overnight on telemetry.   Scheduled Meds: . atorvastatin  20 mg Oral Daily  . bacitracin   Topical BID  . docusate sodium  100 mg Oral BID  . ezetimibe  5 mg Oral Daily  . levothyroxine  100 mcg Oral QAC breakfast  . lisinopril  10 mg Oral Daily  . metoprolol tartrate  12.5 mg Oral BID  . pantoprazole  40 mg Oral Daily   Continuous Infusions: . sodium chloride     PRN Meds:.acetaminophen, cyclobenzaprine, morphine injection, nitroGLYCERIN, ondansetron **OR** ondansetron (ZOFRAN) IV    Vitals:   09/10/16 0400 09/10/16 0507 09/10/16 0600 09/10/16 0700  BP: (!) 124/47 (!) 150/55 137/66 (!) 157/52  Pulse: 65 81 66 74  Resp: 15 (!) 23 19 17   Temp: 98.1 F (36.7 C)     TempSrc:      SpO2: 96% 92% 96% 94%  Weight:      Height:        Intake/Output Summary (Last 24 hours) at 09/10/16 0855 Last data filed at 09/10/16 0500  Gross per 24 hour  Intake           1217.5 ml  Output             1050 ml  Net            167.5 ml    LABS: Basic Metabolic Panel:  Recent Labs  09/09/16 1230 09/10/16 0233  NA 138 138  K 4.4 5.5*  CL 106 107  CO2 22 22  GLUCOSE 95 99  BUN 19 17  CREATININE 0.98 0.83  CALCIUM 9.8 8.8*   Liver Function Tests:  Recent Labs  09/09/16 2019  AST 49*  ALT 22  ALKPHOS 64  BILITOT 0.7  PROT 6.1*  ALBUMIN 3.7   No results for input(s): LIPASE, AMYLASE in the last 72 hours. CBC:  Recent Labs  09/09/16 1230 09/10/16 0233  WBC 7.6 6.7  NEUTROABS 4.9  --   HGB 14.6 11.6*  HCT 43.1 35.0*  MCV 90.0 90.9  PLT 201 254   Cardiac Enzymes: No results for input(s): CKTOTAL, CKMB, CKMBINDEX, TROPONINI in the last 72 hours. BNP: Invalid input(s): POCBNP D-Dimer: No results for input(s): DDIMER in the last 72 hours. Hemoglobin A1C: No results for input(s): HGBA1C in the last 72 hours. Fasting  Lipid Panel: No results for input(s): CHOL, HDL, LDLCALC, TRIG, CHOLHDL, LDLDIRECT in the last 72 hours. Thyroid Function Tests: No results for input(s): TSH, T4TOTAL, T3FREE, THYROIDAB in the last 72 hours.  Invalid input(s): FREET3 Anemia Panel: No results for input(s): VITAMINB12, FOLATE, FERRITIN, TIBC, IRON, RETICCTPCT in the last 72 hours.  RADIOLOGY: Ct Head Wo Contrast  Result Date: 09/10/2016 CLINICAL DATA:  Intracranial hemorrhage.  Trauma yesterday. EXAM: CT HEAD WITHOUT CONTRAST TECHNIQUE: Contiguous axial images were obtained from the base of the skull through the vertex without intravenous contrast. COMPARISON:  CT head without contrast 09/09/2016 FINDINGS: Brain: Extensive subarachnoid and subdural hemorrhage is again seen. Subdural blood is evident along the falx and over the left convexity. There is blooming of a hemorrhagic contusion in the anterior right frontal lobe which now measures 3 cm maximally. This creates local mass effect with surrounding edema but no midline shift. Extensive subarachnoid hemorrhage is present in the right sylvian fissure and over the convexity bilaterally. Additional hemorrhagic contusion  is present along the anterior inferior frontal lobes bilaterally, right greater than left. Subarachnoid blood is again seen along the left temporal lobe. No new areas of hemorrhage are present. Vascular: Atherosclerotic calcifications are again noted within the cavernous internal carotid arteries bilaterally. There is no hyperdense vessel. Skull: The calvarium is intact. Sinuses/Orbits: Right maxillary sinus fracture, anterior process fracture, and nasal bone fractures are again seen. No new fractures are present. Hemorrhage within the right maxillary sinus is slightly decreased. The remaining paranasal sinuses and the mastoid air cells are clear. Other: Extensive right periorbital hematoma is again noted. Diffuse soft tissue swelling extends over the right side of the  face. IMPRESSION: 1. Blooming of hemorrhagic contusion in the anterior right frontal lobe now measuring up to 3 cm. Although there is some surrounding vasogenic edema, there is no midline shift associated with this hemorrhage. 2. Diffuse subarachnoid and subdural hemorrhage is otherwise stable. 3. No new areas of hemorrhage. 4. Right maxillary fractures are stable. 5. Slight decrease and right maxillary hemo sinus. 6. Extensive hemorrhage and soft tissue swelling surrounding the right orbit and over the right face. Electronically Signed   By: San Morelle M.D.   On: 09/10/2016 07:25   Ct Head Wo Contrast  Result Date: 09/09/2016 CLINICAL DATA:  Syncopal episode. Trauma to head. Right periorbital hematoma. Laceration to the right side of the face and head, and upper lip EXAM: CT HEAD WITHOUT CONTRAST CT MAXILLOFACIAL WITHOUT CONTRAST CT CERVICAL SPINE WITHOUT CONTRAST TECHNIQUE: Multidetector CT imaging of the head, cervical spine, and maxillofacial structures were performed using the standard protocol without intravenous contrast. Multiplanar CT image reconstructions of the cervical spine and maxillofacial structures were also generated. COMPARISON:  None. FINDINGS: CT HEAD FINDINGS Brain: Extensive subarachnoid hemorrhage is present within the right sylvian fissure an over the right convexity. There is hemorrhagic contusion involving the right temporal tip and anterior inferior right frontal lobe along the anterior cranial fossa. Extensive subdural hematoma is present along the left side of the falx cerebri. Additional subarachnoid hemorrhage is present over the high left frontal convexity. Additional areas of subarachnoid hemorrhage are present over the lateral left temporal lobe. Vascular: No focal hyperdense vessel is present. Skull: There is a frontal process fracture of the right maxilla. The calvarium is otherwise intact. Right zygomatic arch is intact. Please see face CT for further detail of  right maxillary sinus fracture. Other: Extensive right periorbital hematoma is noted. CT MAXILLOFACIAL FINDINGS Osseous: A comminuted anterior fractures present in the right maxillary sinus. The right maxillary sinus is filled with blood. The frontal process the right maxillary sinus is displaced medially 2 mm. There is also a comminuted distal right nasal bone fracture. Right zygomatic arch is intact. The mandible is intact and located. Orbits: The globes are intact bilaterally. Extensive right periorbital hematoma is present. No other underlying fracture is evident. Sinuses: Hemorrhage fills the right maxillary sinus. The remaining paranasal sinuses and the mastoid air cells are clear. Soft tissues: Extensive hemorrhages soft tissue swelling is present over the right side of the face. This continues to the level the mandible. CT CERVICAL SPINE FINDINGS Alignment: Slight anterolisthesis is present at C4-5. AP alignment is otherwise anatomic. Skull base and vertebrae: The craniocervical junction is intact. Soft tissues and spinal canal: The soft tissues the neck demonstrate bilateral carotid bifurcation atherosclerotic disease. The thyroid is atrophic. No significant adenopathy is present. No focal soft tissue injury is present. There is fatty infiltration of the parotid glands bilaterally. Disc levels:  Chronic endplate degenerate change and loss of disc height is most evident at C5-6. Uncovertebral spurring contributes to moderate foraminal stenosis bilaterally at this level. Multilevel advanced facet hypertrophy is present. There is fusion across the facet joints at C3-4 and C4-5 bilaterally. Moderate facet degenerative changes are present at C2-3, left greater than right. Upper chest: The lung apices are clear. Atherosclerotic changes are present at the aortic arch without significant aneurysm or stenosis. IMPRESSION: 1. Extensive subarachnoid hemorrhage throughout the right sylvian fissure in lateral left  temporal lobe. 2. Hemorrhagic contusion involving the anterior right temporal tip and anterior frontal lobes, right greater than left. 3. Subdural blood along the left side of the falx cerebri. 4. Large right periorbital hematoma with extensive inflammatory changes extending into the right side of the face. 5. Comminuted anterior maxillary sinus fracture with blood throughout the right maxillary sinus. 6. Minimally displaced frontal process maxillary fracture. 7. Minimally displaced anterior right nasal bone fractures. 8. The mandible is intact. 9. No acute abnormality of the cervical spine. 10. Degenerative changes of the cervical spine are most pronounced at C5-6. These results were called by telephone at the time of interpretation on 09/09/2016 at 2:47 pm to Dr. Wyvonnia Dusky , who verbally acknowledged these results. Electronically Signed   By: San Morelle M.D.   On: 09/09/2016 14:48   Ct Cervical Spine Wo Contrast  Result Date: 09/09/2016 CLINICAL DATA:  Syncopal episode. Trauma to head. Right periorbital hematoma. Laceration to the right side of the face and head, and upper lip EXAM: CT HEAD WITHOUT CONTRAST CT MAXILLOFACIAL WITHOUT CONTRAST CT CERVICAL SPINE WITHOUT CONTRAST TECHNIQUE: Multidetector CT imaging of the head, cervical spine, and maxillofacial structures were performed using the standard protocol without intravenous contrast. Multiplanar CT image reconstructions of the cervical spine and maxillofacial structures were also generated. COMPARISON:  None. FINDINGS: CT HEAD FINDINGS Brain: Extensive subarachnoid hemorrhage is present within the right sylvian fissure an over the right convexity. There is hemorrhagic contusion involving the right temporal tip and anterior inferior right frontal lobe along the anterior cranial fossa. Extensive subdural hematoma is present along the left side of the falx cerebri. Additional subarachnoid hemorrhage is present over the high left frontal convexity.  Additional areas of subarachnoid hemorrhage are present over the lateral left temporal lobe. Vascular: No focal hyperdense vessel is present. Skull: There is a frontal process fracture of the right maxilla. The calvarium is otherwise intact. Right zygomatic arch is intact. Please see face CT for further detail of right maxillary sinus fracture. Other: Extensive right periorbital hematoma is noted. CT MAXILLOFACIAL FINDINGS Osseous: A comminuted anterior fractures present in the right maxillary sinus. The right maxillary sinus is filled with blood. The frontal process the right maxillary sinus is displaced medially 2 mm. There is also a comminuted distal right nasal bone fracture. Right zygomatic arch is intact. The mandible is intact and located. Orbits: The globes are intact bilaterally. Extensive right periorbital hematoma is present. No other underlying fracture is evident. Sinuses: Hemorrhage fills the right maxillary sinus. The remaining paranasal sinuses and the mastoid air cells are clear. Soft tissues: Extensive hemorrhages soft tissue swelling is present over the right side of the face. This continues to the level the mandible. CT CERVICAL SPINE FINDINGS Alignment: Slight anterolisthesis is present at C4-5. AP alignment is otherwise anatomic. Skull base and vertebrae: The craniocervical junction is intact. Soft tissues and spinal canal: The soft tissues the neck demonstrate bilateral carotid bifurcation atherosclerotic disease. The thyroid is atrophic. No  significant adenopathy is present. No focal soft tissue injury is present. There is fatty infiltration of the parotid glands bilaterally. Disc levels: Chronic endplate degenerate change and loss of disc height is most evident at C5-6. Uncovertebral spurring contributes to moderate foraminal stenosis bilaterally at this level. Multilevel advanced facet hypertrophy is present. There is fusion across the facet joints at C3-4 and C4-5 bilaterally. Moderate  facet degenerative changes are present at C2-3, left greater than right. Upper chest: The lung apices are clear. Atherosclerotic changes are present at the aortic arch without significant aneurysm or stenosis. IMPRESSION: 1. Extensive subarachnoid hemorrhage throughout the right sylvian fissure in lateral left temporal lobe. 2. Hemorrhagic contusion involving the anterior right temporal tip and anterior frontal lobes, right greater than left. 3. Subdural blood along the left side of the falx cerebri. 4. Large right periorbital hematoma with extensive inflammatory changes extending into the right side of the face. 5. Comminuted anterior maxillary sinus fracture with blood throughout the right maxillary sinus. 6. Minimally displaced frontal process maxillary fracture. 7. Minimally displaced anterior right nasal bone fractures. 8. The mandible is intact. 9. No acute abnormality of the cervical spine. 10. Degenerative changes of the cervical spine are most pronounced at C5-6. These results were called by telephone at the time of interpretation on 09/09/2016 at 2:47 pm to Dr. Wyvonnia Dusky , who verbally acknowledged these results. Electronically Signed   By: San Morelle M.D.   On: 09/09/2016 14:48   Dg Chest Port 1 View  Result Date: 09/09/2016 CLINICAL DATA:  Fall today with multiple facial fractures and intracranial hemorrhage. EXAM: PORTABLE CHEST 1 VIEW COMPARISON:  Chest CT 04/21/2016 FINDINGS: Normal heart size. Mild tortuosity and atherosclerosis of the thoracic aorta. The lungs are clear. Pulmonary vasculature is normal. No consolidation, pleural effusion, or pneumothorax. No acute osseous abnormalities are seen. IMPRESSION: No acute abnormality. Thoracic aortic atherosclerosis. Electronically Signed   By: Jeb Levering M.D.   On: 09/09/2016 22:08   Ct Maxillofacial Wo Contrast  Result Date: 09/09/2016 CLINICAL DATA:  Syncopal episode. Trauma to head. Right periorbital hematoma. Laceration to the  right side of the face and head, and upper lip EXAM: CT HEAD WITHOUT CONTRAST CT MAXILLOFACIAL WITHOUT CONTRAST CT CERVICAL SPINE WITHOUT CONTRAST TECHNIQUE: Multidetector CT imaging of the head, cervical spine, and maxillofacial structures were performed using the standard protocol without intravenous contrast. Multiplanar CT image reconstructions of the cervical spine and maxillofacial structures were also generated. COMPARISON:  None. FINDINGS: CT HEAD FINDINGS Brain: Extensive subarachnoid hemorrhage is present within the right sylvian fissure an over the right convexity. There is hemorrhagic contusion involving the right temporal tip and anterior inferior right frontal lobe along the anterior cranial fossa. Extensive subdural hematoma is present along the left side of the falx cerebri. Additional subarachnoid hemorrhage is present over the high left frontal convexity. Additional areas of subarachnoid hemorrhage are present over the lateral left temporal lobe. Vascular: No focal hyperdense vessel is present. Skull: There is a frontal process fracture of the right maxilla. The calvarium is otherwise intact. Right zygomatic arch is intact. Please see face CT for further detail of right maxillary sinus fracture. Other: Extensive right periorbital hematoma is noted. CT MAXILLOFACIAL FINDINGS Osseous: A comminuted anterior fractures present in the right maxillary sinus. The right maxillary sinus is filled with blood. The frontal process the right maxillary sinus is displaced medially 2 mm. There is also a comminuted distal right nasal bone fracture. Right zygomatic arch is intact. The mandible is intact  and located. Orbits: The globes are intact bilaterally. Extensive right periorbital hematoma is present. No other underlying fracture is evident. Sinuses: Hemorrhage fills the right maxillary sinus. The remaining paranasal sinuses and the mastoid air cells are clear. Soft tissues: Extensive hemorrhages soft tissue  swelling is present over the right side of the face. This continues to the level the mandible. CT CERVICAL SPINE FINDINGS Alignment: Slight anterolisthesis is present at C4-5. AP alignment is otherwise anatomic. Skull base and vertebrae: The craniocervical junction is intact. Soft tissues and spinal canal: The soft tissues the neck demonstrate bilateral carotid bifurcation atherosclerotic disease. The thyroid is atrophic. No significant adenopathy is present. No focal soft tissue injury is present. There is fatty infiltration of the parotid glands bilaterally. Disc levels: Chronic endplate degenerate change and loss of disc height is most evident at C5-6. Uncovertebral spurring contributes to moderate foraminal stenosis bilaterally at this level. Multilevel advanced facet hypertrophy is present. There is fusion across the facet joints at C3-4 and C4-5 bilaterally. Moderate facet degenerative changes are present at C2-3, left greater than right. Upper chest: The lung apices are clear. Atherosclerotic changes are present at the aortic arch without significant aneurysm or stenosis. IMPRESSION: 1. Extensive subarachnoid hemorrhage throughout the right sylvian fissure in lateral left temporal lobe. 2. Hemorrhagic contusion involving the anterior right temporal tip and anterior frontal lobes, right greater than left. 3. Subdural blood along the left side of the falx cerebri. 4. Large right periorbital hematoma with extensive inflammatory changes extending into the right side of the face. 5. Comminuted anterior maxillary sinus fracture with blood throughout the right maxillary sinus. 6. Minimally displaced frontal process maxillary fracture. 7. Minimally displaced anterior right nasal bone fractures. 8. The mandible is intact. 9. No acute abnormality of the cervical spine. 10. Degenerative changes of the cervical spine are most pronounced at C5-6. These results were called by telephone at the time of interpretation on  09/09/2016 at 2:47 pm to Dr. Wyvonnia Dusky , who verbally acknowledged these results. Electronically Signed   By: San Morelle M.D.   On: 09/09/2016 14:48    PHYSICAL EXAM General: NAD HEENT: Extensive facial ecchymosis Neck: No JVD, no thyromegaly or thyroid nodule.  Lungs: Clear to auscultation bilaterally with normal respiratory effort. CV: Nondisplaced PMI.  Heart regular S1/S2, no S3/S4, no murmur.  No peripheral edema.  No carotid bruit.  Normal pedal pulses.  Abdomen: Soft, nontender, no hepatosplenomegaly, no distention.  Neurologic: Alert and oriented x 3.  Psych: Normal affect. Extremities: No clubbing or cyanosis.   TELEMETRY: Reviewed telemetry pt in NSR, no events  ASSESSMENT AND PLAN: 82 yo with history of CAD and pulmonary fibrosis was brought to ER after syncope and fall with resulting SDH and SAH.   1. Syncope: Etiology unclear.  She was standing up at church bagging potatoes.  No prodrome, just went down.  No prior history of syncope.  No arrhythmias since arrival to [facility].  She has previous history of MI so I am worried that this could have been arrhythmogenic syncope.  No chest pain or evidence for MI at this point.  ECG appeared normal. She had mild pulmonary hypertension on last echo (may be related to interstitial fibrosis) => doubt this is related to her syncope.  - Continue to monitor on telemetry.  If no arrhythmias noted in hospital, I think that loop recorder placement (LINQ monitor) would be reasonable.  - Will get an echocardiogram to look for any changes.  - Send troponin  x 1 though doubt ACS.  2. CAD: H/o DES to RCA.  Plavix/ASA on hold with SDH/SAH.  Has been > 1 year since last PCI.  No chest pain.  Will send troponin x 1 as above.  3. Facial trauma, SDH/SAH: Per surgical services.  4. Hyperkalemia: Noted on today's labs.  Repeat stat prior to giving lisinopril.  Creatinine stable. ?Hemolysis.   Loralie Champagne 09/10/2016 9:01 AM

## 2016-09-11 ENCOUNTER — Inpatient Hospital Stay (HOSPITAL_COMMUNITY): Payer: Medicare Other

## 2016-09-11 DIAGNOSIS — R569 Unspecified convulsions: Secondary | ICD-10-CM

## 2016-09-11 DIAGNOSIS — J80 Acute respiratory distress syndrome: Secondary | ICD-10-CM

## 2016-09-11 DIAGNOSIS — R55 Syncope and collapse: Secondary | ICD-10-CM

## 2016-09-11 LAB — BLOOD GAS, ARTERIAL
Acid-base deficit: 3.1 mmol/L — ABNORMAL HIGH (ref 0.0–2.0)
BICARBONATE: 21 mmol/L (ref 20.0–28.0)
Drawn by: 24513
FIO2: 40
O2 Saturation: 97.9 %
PATIENT TEMPERATURE: 98.6
PCO2 ART: 35.2 mmHg (ref 32.0–48.0)
PEEP: 5 cmH2O
RATE: 20 resp/min
VT: 400 mL
pH, Arterial: 7.394 (ref 7.350–7.450)
pO2, Arterial: 108 mmHg (ref 83.0–108.0)

## 2016-09-11 LAB — CBC
HCT: 36.1 % (ref 36.0–46.0)
HEMOGLOBIN: 12 g/dL (ref 12.0–15.0)
MCH: 29.9 pg (ref 26.0–34.0)
MCHC: 33.2 g/dL (ref 30.0–36.0)
MCV: 90 fL (ref 78.0–100.0)
PLATELETS: 224 10*3/uL (ref 150–400)
RBC: 4.01 MIL/uL (ref 3.87–5.11)
RDW: 13.7 % (ref 11.5–15.5)
WBC: 7.3 10*3/uL (ref 4.0–10.5)

## 2016-09-11 LAB — GLUCOSE, CAPILLARY: GLUCOSE-CAPILLARY: 130 mg/dL — AB (ref 65–99)

## 2016-09-11 LAB — BASIC METABOLIC PANEL
Anion gap: 6 (ref 5–15)
BUN: 17 mg/dL (ref 6–20)
CALCIUM: 8.9 mg/dL (ref 8.9–10.3)
CO2: 24 mmol/L (ref 22–32)
CREATININE: 0.98 mg/dL (ref 0.44–1.00)
Chloride: 109 mmol/L (ref 101–111)
GFR calc Af Amer: >60 mL/min (ref >60)
GFR, EST NON AFRICAN AMERICAN: 52 mL/min — AB (ref >60)
GLUCOSE: 128 mg/dL — AB (ref 65–99)
Potassium: 3.9 mmol/L (ref 3.5–5.1)
Sodium: 139 mmol/L (ref 135–145)

## 2016-09-11 LAB — TROPONIN I: Troponin I: <0.03 ng/mL (ref <0.03)

## 2016-09-11 LAB — ECHOCARDIOGRAM COMPLETE
HEIGHTINCHES: 66 in
WEIGHTICAEL: 2864 [oz_av]

## 2016-09-11 LAB — TRIGLYCERIDES: Triglycerides: 94 mg/dL (ref <150)

## 2016-09-11 LAB — LACTIC ACID, PLASMA: LACTIC ACID, VENOUS: 4.5 mmol/L — AB (ref 0.5–1.9)

## 2016-09-11 MED ORDER — LORAZEPAM 2 MG/ML IJ SOLN: 1.0000 mg | Freq: Once | INTRAMUSCULAR | Status: DC

## 2016-09-11 MED ORDER — PROPOFOL 1000 MG/100ML IV EMUL: 0.0000 ug/kg/min | INTRAVENOUS | Status: DC

## 2016-09-11 MED ORDER — ETOMIDATE 2 MG/ML IV SOLN: 20.0000 mg/kg | Freq: Once | INTRAVENOUS | Status: DC

## 2016-09-11 MED ORDER — MIDAZOLAM HCL 2 MG/2ML IJ SOLN
INTRAMUSCULAR | Status: AC
  Administered 2016-09-11: 1 mg
  Filled 2016-09-11: qty 2

## 2016-09-11 MED ORDER — LORAZEPAM 2 MG/ML IJ SOLN
INTRAMUSCULAR | Status: AC
  Filled 2016-09-11: qty 1

## 2016-09-11 MED ORDER — PROPOFOL 1000 MG/100ML IV EMUL
INTRAVENOUS | Status: AC
  Filled 2016-09-11: qty 100

## 2016-09-11 MED ORDER — FENTANYL CITRATE (PF) 100 MCG/2ML IJ SOLN: 50.0000 ug | INTRAMUSCULAR | Status: DC | PRN

## 2016-09-11 MED ORDER — FENTANYL CITRATE (PF) 100 MCG/2ML IJ SOLN
50.0000 ug | INTRAMUSCULAR | Status: DC | PRN
  Administered 2016-09-11 (×2): 50 ug via INTRAVENOUS
  Filled 2016-09-11 (×2): qty 2

## 2016-09-11 MED ORDER — ORAL CARE MOUTH RINSE
15.0000 mL | OROMUCOSAL | Status: DC
  Administered 2016-09-11 (×4): 15 mL via OROMUCOSAL

## 2016-09-11 MED ORDER — SODIUM CHLORIDE 0.9 % IV SOLN
1000.0000 mg | Freq: Once | INTRAVENOUS | Status: AC
  Administered 2016-09-11: 1000 mg via INTRAVENOUS
  Filled 2016-09-11: qty 10

## 2016-09-11 MED ORDER — PROPOFOL 1000 MG/100ML IV EMUL
0.0000 ug/kg/min | INTRAVENOUS | Status: DC
  Administered 2016-09-11: 5 ug/kg/min via INTRAVENOUS

## 2016-09-11 MED ORDER — CHLORHEXIDINE GLUCONATE 0.12% ORAL RINSE (MEDLINE KIT)
15.0000 mL | Freq: Two times a day (BID) | OROMUCOSAL | Status: DC
  Administered 2016-09-11 – 2016-09-12 (×3): 15 mL via OROMUCOSAL

## 2016-09-11 MED ORDER — FENTANYL CITRATE (PF) 100 MCG/2ML IJ SOLN
INTRAMUSCULAR | Status: AC
  Administered 2016-09-11: 50 ug
  Filled 2016-09-11: qty 2

## 2016-09-11 MED ORDER — LEVETIRACETAM 500 MG/5ML IV SOLN
500.0000 mg | Freq: Two times a day (BID) | INTRAVENOUS | Status: DC
  Administered 2016-09-11 – 2016-09-12 (×2): 500 mg via INTRAVENOUS
  Filled 2016-09-11 (×4): qty 5

## 2016-09-11 MED ORDER — ETOMIDATE 2 MG/ML IV SOLN
20.0000 mg | Freq: Once | INTRAVENOUS | Status: AC
  Administered 2016-09-11: 20 mg via INTRAVENOUS

## 2016-09-11 NOTE — Procedures (Signed)
Extubation Procedure Note  Patient Details:   Name: Kristin Shaffer DOB: 1931-04-10 MRN: [id number]   Airway Documentation:     Evaluation  O2 sats: [id number]  Complications: No apparent complications Patient did tolerate procedure well. Pt is awake and can follow commands. Deep oral sxn done. BBS clr. Positive cuff leak. Per MD extubated pt to 2L [place]. Pt tol well. No stridor. Post extubation BBS clear. Pt can speak her name. RN at pts bedside      Constance Holster 09/11/2016, 1:39 PM

## 2016-09-11 NOTE — Progress Notes (Signed)
Echocardiogram 2D Echocardiogram has been performed.  Kristin Shaffer 09/11/2016, 11:34 AM

## 2016-09-11 NOTE — Progress Notes (Signed)
Around 0530 am nurse heard patient making some noises and entered the room to witness what looked like seizure activity. Patient was alert briefly but unable to respond. She went unresponsive and was assisted with breathing a few times and she aroused. Elink responded and patient was intubated and taken to CT for head scan. Patient's son was notified of episode and treatment. He stated he would be coming to see her shortly. Will continue to monitor and notify MD as needed.

## 2016-09-11 NOTE — Progress Notes (Addendum)
eLink Physician-Brief Progress Note Patient Name: Kristin Shaffer DOB: 12-25-1930 MRN: [id number]   Date of Service  09/11/2016  HPI/Events of Note  Called emergently into room for new onset seizure and now unresponsive status, no airway protection and paradoxical RR T waves deep  eICU Interventions  Needs ETT, PCCm will place ett Needs head CT- ordered-stat Ativan eeg Vent orders I wil contact trauma MD now Have reviewed last CT with bleed SAH, contusion, extension? likely Ecg,.trop, lactic MAP is good, if needed ad propofol      Intervention Category Major Interventions: Respiratory failure - evaluation and management  FEINSTEIN,DANIEL J. 09/11/2016, 5:34 AM

## 2016-09-11 NOTE — Progress Notes (Signed)
MD Hulen Skains informed of Lactic Acid 4.5 during morning rounds.

## 2016-09-11 NOTE — Procedures (Signed)
ELECTROENCEPHALOGRAM REPORT  Date of Study: 09/11/2016  Patient's Name: Kristin Shaffer MRN: [id number] Date of Birth: 11/08/1931  Referring Provider: Merrie Roof, MD  Clinical History: 82 year old woman with traumatic subdural hematoma and new onset seizure, requiring intubation  Medications:  acetaminophen (TYLENOL) tablet 650 mg   alum & mag hydroxide-simeth (MAALOX/MYLANTA) 200-200-20 MG/5ML suspension 30 mL   atorvastatin (LIPITOR) tablet 20 mg   bacitracin ointment   chlorhexidine gluconate (MEDLINE KIT) (PERIDEX) 0.12 % solution 15 mL   cyclobenzaprine (FLEXERIL) tablet 5 mg   docusate sodium (COLACE) capsule 100 mg   ezetimibe (ZETIA) tablet 5 mg   fentaNYL (SUBLIMAZE) injection 50 mcg   fentaNYL (SUBLIMAZE) injection 50 mcg   levETIRAcetam (KEPPRA) 500 mg in sodium chloride 0.9 % 100 mL IVPB   levothyroxine (SYNTHROID, LEVOTHROID) tablet 100 mcg   lisinopril (PRINIVIL,ZESTRIL) tablet 10 mg   LORazepam (ATIVAN) 2 MG/ML injection   LORazepam (ATIVAN) injection 1 mg   MEDLINE mouth rinse   metoprolol tartrate (LOPRESSOR) tablet 12.5 mg   morphine 2 MG/ML injection 1 mg   nitroGLYCERIN (NITROSTAT) SL tablet 0.4 mg  L1 ondansetron (ZOFRAN) injection 4 mg  L1 ondansetron (ZOFRAN) tablet 4 mg   pantoprazole (PROTONIX) EC tablet 40 mg   propofol (DIPRIVAN) 1000 MG/100ML infusion   Technical Summary: A multichannel digital EEG recording measured by the international 10-20 system with electrodes applied with paste and impedances below 5000 ohms performed in our laboratory with EKG monitoring in an intubated and sedated patient.  Hyperventilation and photic stimulation were not performed.  The digital EEG was referentially recorded, reformatted, and digitally filtered in a variety of bipolar and referential montages for optimal display.    Description: The patient is intubated and sedated during the recording. There is loss of normal background activity with diffuse 5-6 Hz  theta and 2-3 Hz delta activity and no discernible posterior dominant rhythm.  There is no spontaneous reactivity or reactivity noted with noxious stimulation. There is muscle artifact over the frontal leads. Hyperventilation and photic stimulation were not performed. There were no epileptiform discharges or electrographic seizures seen.   EKG lead was unremarkable.  Impression: This EEG is markedly abnormal due to diffuse background slowing and lack of EEG reactivity with noxious stimulation.  Clinical Correlation of the above findings indicates severe diffuse cerebral dysfunction that is non-specific in etiology and can be seen in the setting of anoxic/ischemic injury, toxic/metabolic encephalopathies, or medication effect. In the absence of CNS active, sedating, or anesthetic medications, this suggests a poor prognosis. Clinical correlation is advised.  Metta Clines, DO

## 2016-09-11 NOTE — Procedures (Signed)
Intubation Procedure Note Kristin Shaffer [id number] 02-13-1931  Procedure: Intubation Indications: Respiratory insufficiency  Procedure Details Consent: Unable to obtain consent because of emergent medical necessity. Time Out: Verified patient identification, verified procedure, site/side was marked, verified correct patient position, special equipment/implants available, medications/allergies/relevent history reviewed, required imaging and test results available.  Performed  Maximum sterile technique was used including gloves, hand hygiene and mask.  Glidescope 3  Called to bedside for altered level of consciousness and respiratory insufficiency. Patient pre-medicated with 1mg  versed, 51mcg fentanyl. 20mg  etomidate given. Single attempt with Glidescope 3 blade, grade 2b view of glottis. 7.5 ETT visualized passing between the cords to a depth of 22cm at the lip. +condensation in tube, +symmetrical chest rise, +color change on CO2 colorimeter, +equal bilateral breath sounds.  No complications.  Evaluation Hemodynamic Status: BP stable throughout; O2 sats: stable throughout and currently acceptable Patient's Current Condition: stable Complications: No apparent complications Patient did tolerate procedure well. Chest X-ray ordered to verify placement.  CXR: pending.   Yisroel Ramming, MD Pulmonary and Critical Care 09/11/16 5:50 AM

## 2016-09-11 NOTE — Progress Notes (Signed)
PT Cancellation Note  Patient Details Name: Kristin Shaffer MRN: [id number] DOB: 1931-09-09   Cancelled Treatment:    Reason Eval/Treat Not Completed: Medical issues which prohibited therapy   Noted patient with ?seizure requiring intubation earlier today. Will defer PT at this time and follow.   Zosia Lucchese 09/11/2016, 9:44 AM Pager [phone]

## 2016-09-11 NOTE — Progress Notes (Deleted)
  Echocardiogram 2D Echocardiogram has been attempted.  Pt was having an EEG. Will attempt again at another time.  Kristin Shaffer 09/11/2016, 10:49 AM

## 2016-09-11 NOTE — Care Management Note (Signed)
Case Management Note  Patient Details  Name: Kristin Shaffer MRN: [id number] Date of Birth: 1931/02/01  Subjective/Objective:        Adm w sudural hematoma, new seizure            Action/Plan: from home, pcp dr Marlyn Corporal   Expected Discharge Date:                  Expected Discharge Plan:     In-House Referral:     Discharge planning Services  CM Consult  Post Acute Care Choice:    Choice offered to:     DME Arranged:    DME Agency:     HH Arranged:    [place] Agency:     Status of Service:  In process, will continue to follow  If discussed at Long Length of Stay Meetings, dates discussed:    Additional Comments: placed on vent 10-16  Lacretia Leigh, RN 09/11/2016, 10:08 AM

## 2016-09-11 NOTE — Progress Notes (Signed)
Found pt on Cpap/ps 5/5, 30. Per Dr Hulen Skains ok to extubate

## 2016-09-11 NOTE — Progress Notes (Signed)
Bedside EEG completed, results pending. 

## 2016-09-11 NOTE — Plan of Care (Signed)
Problem: Nutrition: Goal: Adequate nutrition will be maintained Outcome: Progressing Progressing to cardiac healthy diet after extubation

## 2016-09-11 NOTE — Progress Notes (Signed)
Follow up - Trauma and Critical Care  Patient Details:    Kristin Shaffer is an 82 y.o. female.  Lines/tubes : Airway 7.5 mm (Active)  Secured at (cm) 22 cm 09/11/2016  6:28 AM  Measured From Teeth 09/11/2016  6:28 AM  Secured Location Right 09/11/2016  6:28 AM  Secured By [employer] 09/11/2016  6:28 AM  Site Condition Dry 09/11/2016  6:28 AM    Microbiology/Sepsis markers: Results for orders placed or performed during the hospital encounter of 09/09/16  MRSA PCR Screening     Status: None   Collection Time: 09/09/16  7:05 PM  Result Value Ref Range Status   MRSA by PCR NEGATIVE NEGATIVE Final    Comment:        The GeneXpert MRSA Assay (FDA approved for NASAL specimens only), is one component of a comprehensive MRSA colonization surveillance program. It is not intended to diagnose MRSA infection nor to guide or monitor treatment for MRSA infections.     Anti-infectives:  Anti-infectives    Start     Dose/Rate Route Frequency Ordered Stop   09/09/16 1545  ceFAZolin (ANCEF) IVPB 1 g/50 mL premix     1 g 100 mL/hr over 30 Minutes Intravenous  Once 09/09/16 1544 09/09/16 1820      Best Practice/Protocols:  VTE Prophylaxis: Mechanical GI Prophylaxis: Proton Pump Inhibitor Continous Sedation  Consults:     Events:  Subjective:    Overnight Issues: Intubated this AM after seizure activity and having to be intubated.  Has defibrillator pads in place for unknown reasons.  Have not spoken with the nurse yet.  Objective:  Vital signs for last 24 hours: Temp:  [97.5 F (36.4 C)-98.2 F (36.8 C)] 98.1 F (36.7 C) (10/16 0400) Pulse Rate:  [66-96] 75 (10/16 0645) Resp:  [15-22] 20 (10/16 0645) BP: (110-172)/(42-72) 121/57 (10/16 0645) SpO2:  [95 %-100 %] 95 % (10/16 0645) FiO2 (%):  [40 %] 40 % (10/16 0630)  Hemodynamic parameters for last 24 hours:    Intake/Output from previous day: 10/15 0701 - 10/16 0700 In: 1082.8 [P.O.:1080;  I.V.:2.8] Out: 1750 [Urine:1750]  Intake/Output this shift: No intake/output data recorded.  Vent settings for last 24 hours: Vent Mode: PRVC FiO2 (%):  [40 %] 40 % Set Rate:  [20 bmp] 20 bmp Vt Set:  [400 mL] 400 mL PEEP:  [5 cmH20] 5 cmH20 Plateau Pressure:  [15 cmH20] 15 cmH20  Physical Exam:  General: no respiratory distress and agitated on the ventilator Neuro: oriented, nonfocal exam, RASS 0, RASS -1 and agitated Resp: clear to auscultation bilaterally and CXR is clear with ETT in place. CVS: regular rate and rhythm, S1, S2 normal, no murmur, click, rub or gallop GI: soft, nontender, BS WNL, no r/g Extremities: no edema, no erythema, pulses WNL and unchanged  Results for orders placed or performed during the hospital encounter of 09/09/16 (from the past 24 hour(s))  Basic metabolic panel     Status: Abnormal   Collection Time: 09/10/16  9:07 AM  Result Value Ref Range   Sodium 140 135 - 145 mmol/L   Potassium 3.9 3.5 - 5.1 mmol/L   Chloride 108 101 - 111 mmol/L   CO2 23 22 - 32 mmol/L   Glucose, Bld 107 (H) 65 - 99 mg/dL   BUN 15 6 - 20 mg/dL   Creatinine, Ser 0.88 0.44 - 1.00 mg/dL   Calcium 8.9 8.9 - 10.3 mg/dL   GFR calc non Af Amer 59 (  L) >60 mL/min   GFR calc Af Amer >60 >60 mL/min   Anion gap 9 5 - 15  Troponin I     Status: None   Collection Time: 09/10/16 10:46 AM  Result Value Ref Range   Troponin I <0.03 <0.03 ng/mL  Basic metabolic panel     Status: Abnormal   Collection Time: 09/11/16  3:02 AM  Result Value Ref Range   Sodium 139 135 - 145 mmol/L   Potassium 3.9 3.5 - 5.1 mmol/L   Chloride 109 101 - 111 mmol/L   CO2 24 22 - 32 mmol/L   Glucose, Bld 128 (H) 65 - 99 mg/dL   BUN 17 6 - 20 mg/dL   Creatinine, Ser 0.98 0.44 - 1.00 mg/dL   Calcium 8.9 8.9 - 10.3 mg/dL   GFR calc non Af Amer 52 (L) >60 mL/min   GFR calc Af Amer >60 >60 mL/min   Anion gap 6 5 - 15  CBC     Status: None   Collection Time: 09/11/16  3:02 AM  Result Value Ref Range    WBC 7.3 4.0 - 10.5 K/uL   RBC 4.01 3.87 - 5.11 MIL/uL   Hemoglobin 12.0 12.0 - 15.0 g/dL   HCT 36.1 36.0 - 46.0 %   MCV 90.0 78.0 - 100.0 fL   MCH 29.9 26.0 - 34.0 pg   MCHC 33.2 30.0 - 36.0 g/dL   RDW 13.7 11.5 - 15.5 %   Platelets 224 150 - 400 K/uL  Glucose, capillary     Status: Abnormal   Collection Time: 09/11/16  5:29 AM  Result Value Ref Range   Glucose-Capillary 130 (H) 65 - 99 mg/dL   Comment 1 Capillary Specimen   Draw ABG 1 hour after initiation of ventilator     Status: Abnormal   Collection Time: 09/11/16  6:00 AM  Result Value Ref Range   FIO2 40.00    Delivery systems VENTILATOR    Mode PRESSURE REGULATED VOLUME CONTROL    VT 400 mL   LHR 20 resp/min   Peep/cpap 5.0 cm H20   pH, Arterial 7.394 7.350 - 7.450   pCO2 arterial 35.2 32.0 - 48.0 mmHg   pO2, Arterial 108 83.0 - 108.0 mmHg   Bicarbonate 21.0 20.0 - 28.0 mmol/L   Acid-base deficit 3.1 (H) 0.0 - 2.0 mmol/L   O2 Saturation 97.9 %   Patient temperature 98.6    Collection site RIGHT RADIAL    Drawn by [phone]    Sample type ARTERIAL DRAW    Allens test (pass/fail) PASS PASS  Troponin I     Status: None   Collection Time: 09/11/16  6:19 AM  Result Value Ref Range   Troponin I <0.03 <0.03 ng/mL  Lactic acid, plasma     Status: Abnormal   Collection Time: 09/11/16  6:19 AM  Result Value Ref Range   Lactic Acid, Venous 4.5 (HH) 0.5 - 1.9 mmol/L     Assessment/Plan:   NEURO  Altered Mental Status:  agitation and sedation   Plan: related to being on the ventilator  PULM  No apparent primary disorder   Plan: CPM  CARDIO  No significant issues noted   Plan: CPM  RENAL  ZNo problems with urine outpput and renal function.   Plan: CPM  GI  No primary problems   Plan: CPM.  If intubated for any length of time will pass OGT and start tube feedings.  ID  No known infectious  sources   Plan: CPM  HEME  No signficant problems   Plan: CPM  ENDO No known issues   Plan: CPM  Global Issues  Patient  intubated primarily for seizure activity, no apparent cardiac or primary pulmonary issues.  Lactic acidosis unknown, but I cannot identify any primary source.  Look to get extubated this Am.    LOS: 2 days   Additional comments:I reviewed the patient's new clinical lab test results. cbc/bmet/abg, I reviewed the patients new imaging test results. cxr and I have discussed and reviewed with family members patient's son and other family member.  Explained need for intubation.  Critical Care Total Time*: 30 Minutes  Mclean Moya 09/11/2016  *Care during the described time interval was provided by me and/or other providers on the critical care team.  I have reviewed this patient's available data, including medical history, events of note, physical examination and test results as part of my evaluation.

## 2016-09-11 NOTE — Progress Notes (Signed)
Restraints never applied to patient as less restrictive methods were successful.  Patient transferred to [facility].

## 2016-09-11 NOTE — Progress Notes (Signed)
OT Cancellation Note  Patient Details Name: Kristin Shaffer MRN: [id number] DOB: 01/13/1931   Cancelled Treatment:    Reason Eval/Treat Not Completed: Medical issues which prohibited therapy.  Pt with questionable seizure and now intubated.  Will defer OT today and follow up to determined medical appropriateness for eval.  Lucille Passy, OTR/L [id number]   Lucille Passy M 09/11/2016, 9:51 AM

## 2016-09-11 NOTE — Progress Notes (Signed)
Patient Name: Kristin Shaffer Date of Encounter: 09/11/2016  Primary Cardiologist: [facility] Problem List     Active Problems:   Facial fracture [facility])   TBI (traumatic brain injury) ([place])     Subjective   Sedated, intubated.   Inpatient Medications    Scheduled Meds: . atorvastatin  20 mg Oral Daily  . bacitracin   Topical BID  . docusate sodium  100 mg Oral BID  . ezetimibe  5 mg Oral Daily  . levETIRAcetam  1,000 mg Intravenous Once  . levETIRAcetam  500 mg Intravenous Q12H  . levothyroxine  100 mcg Oral QAC breakfast  . lisinopril  10 mg Oral Daily  . LORazepam      . LORazepam  1 mg Intravenous Once  . metoprolol tartrate  12.5 mg Oral BID  . pantoprazole  40 mg Oral Daily   Continuous Infusions: . propofol (DIPRIVAN) infusion 5 mcg/kg/min (09/11/16 0549)   PRN Meds: acetaminophen, alum & mag hydroxide-simeth, cyclobenzaprine, morphine injection, nitroGLYCERIN, ondansetron **OR** ondansetron (ZOFRAN) IV   Vital Signs    Vitals:   09/11/16 0540 09/11/16 0628 09/11/16 0630 09/11/16 0645  BP: (!) 141/45  (!) 120/53 (!) 121/57  Pulse: 89  81 75  Resp: (!) 22  20 20   Temp:      TempSrc:      SpO2: 100% 97% 96% 95%  Weight:      Height:  5\' 6"  (1.676 m)      Intake/Output Summary (Last 24 hours) at 09/11/16 0725 Last data filed at 09/11/16 0700  Gross per 24 hour  Intake          1082.84 ml  Output             1750 ml  Net          -667.16 ml   Filed Weights   09/09/16 1236  Weight: 179 lb (81.2 kg)    Physical Exam    GEN: Well nourished, well developed, intubated.   HEENT: Grossly normal.  Neck: Supple, no JVD, carotid bruits, or masses. Cardiac: RRR, no murmurs, rubs, or gallops. No clubbing, cyanosis, edema.   Respiratory:  Respirations regular and unlabored, clear to auscultation bilaterally. GI: Soft, nondistended, BS + x 4. MS: no deformity or atrophy. Skin: warm and dry, no rash. Neuro:  Strength and sensation are  intact. Psych: AAOx3.  Normal affect.  Labs    CBC  Recent Labs  09/09/16 1230 09/10/16 0233 09/11/16 0302  WBC 7.6 6.7 7.3  NEUTROABS 4.9  --   --   HGB 14.6 11.6* 12.0  HCT 43.1 35.0* 36.1  MCV 90.0 90.9 90.0  PLT 201 254 [id number]   Basic Metabolic Panel  Recent Labs  09/10/16 0907 09/11/16 0302  NA 140 139  K 3.9 3.9  CL 108 109  CO2 23 24  GLUCOSE 107* 128*  BUN 15 17  CREATININE 0.88 0.98  CALCIUM 8.9 8.9   Liver Function Tests  Recent Labs  09/09/16 2019  AST 49*  ALT 22  ALKPHOS 64  BILITOT 0.7  PROT 6.1*  ALBUMIN 3.7   Cardiac Enzymes  Recent Labs  09/10/16 1046 09/11/16 0619  TROPONINI <0.03 <0.03    Telemetry    Sinus - Personally Reviewed  ECG     Radiology    Ct Head Wo Contrast  Result Date: 09/11/2016 CLINICAL DATA:  82 y/o F; intracranial hemorrhage with seizure requiring intubation. EXAM: CT HEAD WITHOUT CONTRAST TECHNIQUE:  Contiguous axial images were obtained from the base of the skull through the vertex without intravenous contrast. COMPARISON:  09/10/2016 CT of the head. FINDINGS: Brain: Stable right frontal hemorrhagic contusion with mild associated edema and local mass effect. Stable subdural hematoma overlying the left cerebral convexity and along the falx. Stable diffuse subarachnoid hemorrhage and trace intraventricular hemorrhage. No new intracranial hemorrhage is identified. No midline shift or herniation. Ventricle size is stable. Vascular: Extensive calcific atherosclerosis of carotid siphons. Skull: Decreased swelling of the right frontal and periorbital scalp and stable hematoma overlying the left superolateral orbital rim. Sinuses/Orbits: Stable right maxillary sinus fractures and right nasal bone fracture with interval partial dispersion of hemorrhage into the right maxillary sinus. Paranasal sinuses are otherwise normally aerated. Bilateral intra-ocular lens replacement. No displaced orbital fracture or traumatic process  of the orbit. Other: Partially visualized endotracheal tube. IMPRESSION: 1. Stable right frontal hemorrhagic contusion, edema, and local mass effect. 2. Stable diffuse subarachnoid hemorrhage. 3. Stable subdural hematoma over the left frontal parietal convexity and along the falx. 4. Stable trace intraventricular hemorrhage. 5. No new intracranial hemorrhage, midline shift, or herniation. 6. Decreased swelling in the right frontal scalp and periorbital soft tissues. Stable right maxillary and right nasal bone fractures and hemorrhage in the right maxillary sinus. Electronically Signed   By: Kristine Garbe M.D.   On: 09/11/2016 06:37   Ct Head Wo Contrast  Result Date: 09/10/2016 CLINICAL DATA:  Intracranial hemorrhage.  Trauma yesterday. EXAM: CT HEAD WITHOUT CONTRAST TECHNIQUE: Contiguous axial images were obtained from the base of the skull through the vertex without intravenous contrast. COMPARISON:  CT head without contrast 09/09/2016 FINDINGS: Brain: Extensive subarachnoid and subdural hemorrhage is again seen. Subdural blood is evident along the falx and over the left convexity. There is blooming of a hemorrhagic contusion in the anterior right frontal lobe which now measures 3 cm maximally. This creates local mass effect with surrounding edema but no midline shift. Extensive subarachnoid hemorrhage is present in the right sylvian fissure and over the convexity bilaterally. Additional hemorrhagic contusion is present along the anterior inferior frontal lobes bilaterally, right greater than left. Subarachnoid blood is again seen along the left temporal lobe. No new areas of hemorrhage are present. Vascular: Atherosclerotic calcifications are again noted within the cavernous internal carotid arteries bilaterally. There is no hyperdense vessel. Skull: The calvarium is intact. Sinuses/Orbits: Right maxillary sinus fracture, anterior process fracture, and nasal bone fractures are again seen. No new  fractures are present. Hemorrhage within the right maxillary sinus is slightly decreased. The remaining paranasal sinuses and the mastoid air cells are clear. Other: Extensive right periorbital hematoma is again noted. Diffuse soft tissue swelling extends over the right side of the face. IMPRESSION: 1. Blooming of hemorrhagic contusion in the anterior right frontal lobe now measuring up to 3 cm. Although there is some surrounding vasogenic edema, there is no midline shift associated with this hemorrhage. 2. Diffuse subarachnoid and subdural hemorrhage is otherwise stable. 3. No new areas of hemorrhage. 4. Right maxillary fractures are stable. 5. Slight decrease and right maxillary hemo sinus. 6. Extensive hemorrhage and soft tissue swelling surrounding the right orbit and over the right face. Electronically Signed   By: San Morelle M.D.   On: 09/10/2016 07:25   Ct Head Wo Contrast  Result Date: 09/09/2016 CLINICAL DATA:  Syncopal episode. Trauma to head. Right periorbital hematoma. Laceration to the right side of the face and head, and upper lip EXAM: CT HEAD WITHOUT CONTRAST CT  MAXILLOFACIAL WITHOUT CONTRAST CT CERVICAL SPINE WITHOUT CONTRAST TECHNIQUE: Multidetector CT imaging of the head, cervical spine, and maxillofacial structures were performed using the standard protocol without intravenous contrast. Multiplanar CT image reconstructions of the cervical spine and maxillofacial structures were also generated. COMPARISON:  None. FINDINGS: CT HEAD FINDINGS Brain: Extensive subarachnoid hemorrhage is present within the right sylvian fissure an over the right convexity. There is hemorrhagic contusion involving the right temporal tip and anterior inferior right frontal lobe along the anterior cranial fossa. Extensive subdural hematoma is present along the left side of the falx cerebri. Additional subarachnoid hemorrhage is present over the high left frontal convexity. Additional areas of subarachnoid  hemorrhage are present over the lateral left temporal lobe. Vascular: No focal hyperdense vessel is present. Skull: There is a frontal process fracture of the right maxilla. The calvarium is otherwise intact. Right zygomatic arch is intact. Please see face CT for further detail of right maxillary sinus fracture. Other: Extensive right periorbital hematoma is noted. CT MAXILLOFACIAL FINDINGS Osseous: A comminuted anterior fractures present in the right maxillary sinus. The right maxillary sinus is filled with blood. The frontal process the right maxillary sinus is displaced medially 2 mm. There is also a comminuted distal right nasal bone fracture. Right zygomatic arch is intact. The mandible is intact and located. Orbits: The globes are intact bilaterally. Extensive right periorbital hematoma is present. No other underlying fracture is evident. Sinuses: Hemorrhage fills the right maxillary sinus. The remaining paranasal sinuses and the mastoid air cells are clear. Soft tissues: Extensive hemorrhages soft tissue swelling is present over the right side of the face. This continues to the level the mandible. CT CERVICAL SPINE FINDINGS Alignment: Slight anterolisthesis is present at C4-5. AP alignment is otherwise anatomic. Skull base and vertebrae: The craniocervical junction is intact. Soft tissues and spinal canal: The soft tissues the neck demonstrate bilateral carotid bifurcation atherosclerotic disease. The thyroid is atrophic. No significant adenopathy is present. No focal soft tissue injury is present. There is fatty infiltration of the parotid glands bilaterally. Disc levels: Chronic endplate degenerate change and loss of disc height is most evident at C5-6. Uncovertebral spurring contributes to moderate foraminal stenosis bilaterally at this level. Multilevel advanced facet hypertrophy is present. There is fusion across the facet joints at C3-4 and C4-5 bilaterally. Moderate facet degenerative changes are  present at C2-3, left greater than right. Upper chest: The lung apices are clear. Atherosclerotic changes are present at the aortic arch without significant aneurysm or stenosis. IMPRESSION: 1. Extensive subarachnoid hemorrhage throughout the right sylvian fissure in lateral left temporal lobe. 2. Hemorrhagic contusion involving the anterior right temporal tip and anterior frontal lobes, right greater than left. 3. Subdural blood along the left side of the falx cerebri. 4. Large right periorbital hematoma with extensive inflammatory changes extending into the right side of the face. 5. Comminuted anterior maxillary sinus fracture with blood throughout the right maxillary sinus. 6. Minimally displaced frontal process maxillary fracture. 7. Minimally displaced anterior right nasal bone fractures. 8. The mandible is intact. 9. No acute abnormality of the cervical spine. 10. Degenerative changes of the cervical spine are most pronounced at C5-6. These results were called by telephone at the time of interpretation on 09/09/2016 at 2:47 pm to Dr. Wyvonnia Dusky , who verbally acknowledged these results. Electronically Signed   By: San Morelle M.D.   On: 09/09/2016 14:48   Ct Cervical Spine Wo Contrast  Result Date: 09/09/2016 CLINICAL DATA:  Syncopal episode. Trauma to head.  Right periorbital hematoma. Laceration to the right side of the face and head, and upper lip EXAM: CT HEAD WITHOUT CONTRAST CT MAXILLOFACIAL WITHOUT CONTRAST CT CERVICAL SPINE WITHOUT CONTRAST TECHNIQUE: Multidetector CT imaging of the head, cervical spine, and maxillofacial structures were performed using the standard protocol without intravenous contrast. Multiplanar CT image reconstructions of the cervical spine and maxillofacial structures were also generated. COMPARISON:  None. FINDINGS: CT HEAD FINDINGS Brain: Extensive subarachnoid hemorrhage is present within the right sylvian fissure an over the right convexity. There is hemorrhagic  contusion involving the right temporal tip and anterior inferior right frontal lobe along the anterior cranial fossa. Extensive subdural hematoma is present along the left side of the falx cerebri. Additional subarachnoid hemorrhage is present over the high left frontal convexity. Additional areas of subarachnoid hemorrhage are present over the lateral left temporal lobe. Vascular: No focal hyperdense vessel is present. Skull: There is a frontal process fracture of the right maxilla. The calvarium is otherwise intact. Right zygomatic arch is intact. Please see face CT for further detail of right maxillary sinus fracture. Other: Extensive right periorbital hematoma is noted. CT MAXILLOFACIAL FINDINGS Osseous: A comminuted anterior fractures present in the right maxillary sinus. The right maxillary sinus is filled with blood. The frontal process the right maxillary sinus is displaced medially 2 mm. There is also a comminuted distal right nasal bone fracture. Right zygomatic arch is intact. The mandible is intact and located. Orbits: The globes are intact bilaterally. Extensive right periorbital hematoma is present. No other underlying fracture is evident. Sinuses: Hemorrhage fills the right maxillary sinus. The remaining paranasal sinuses and the mastoid air cells are clear. Soft tissues: Extensive hemorrhages soft tissue swelling is present over the right side of the face. This continues to the level the mandible. CT CERVICAL SPINE FINDINGS Alignment: Slight anterolisthesis is present at C4-5. AP alignment is otherwise anatomic. Skull base and vertebrae: The craniocervical junction is intact. Soft tissues and spinal canal: The soft tissues the neck demonstrate bilateral carotid bifurcation atherosclerotic disease. The thyroid is atrophic. No significant adenopathy is present. No focal soft tissue injury is present. There is fatty infiltration of the parotid glands bilaterally. Disc levels: Chronic endplate degenerate  change and loss of disc height is most evident at C5-6. Uncovertebral spurring contributes to moderate foraminal stenosis bilaterally at this level. Multilevel advanced facet hypertrophy is present. There is fusion across the facet joints at C3-4 and C4-5 bilaterally. Moderate facet degenerative changes are present at C2-3, left greater than right. Upper chest: The lung apices are clear. Atherosclerotic changes are present at the aortic arch without significant aneurysm or stenosis. IMPRESSION: 1. Extensive subarachnoid hemorrhage throughout the right sylvian fissure in lateral left temporal lobe. 2. Hemorrhagic contusion involving the anterior right temporal tip and anterior frontal lobes, right greater than left. 3. Subdural blood along the left side of the falx cerebri. 4. Large right periorbital hematoma with extensive inflammatory changes extending into the right side of the face. 5. Comminuted anterior maxillary sinus fracture with blood throughout the right maxillary sinus. 6. Minimally displaced frontal process maxillary fracture. 7. Minimally displaced anterior right nasal bone fractures. 8. The mandible is intact. 9. No acute abnormality of the cervical spine. 10. Degenerative changes of the cervical spine are most pronounced at C5-6. These results were called by telephone at the time of interpretation on 09/09/2016 at 2:47 pm to Dr. Wyvonnia Dusky , who verbally acknowledged these results. Electronically Signed   By: Wynetta Fines.D.  On: 09/09/2016 14:48   Dg Chest Port 1 View  Result Date: 09/11/2016 CLINICAL DATA:  Respiratory failure. EXAM: PORTABLE CHEST 1 VIEW COMPARISON:  09/09/2016. FINDINGS: Endotracheal tube noted with its tip 4 cm above the carina. Heart size normal. Low lung volumes with basilar atelectasis. A developing infiltrate left lung base cannot be excluded. No pleural effusion or pneumothorax. IMPRESSION: 1. Endotracheal tube tip noted 4 cm above the carina. 2. Low lung  volumes with bibasilar atelectasis. A developing infiltrate the left lung base cannot be excluded. Electronically Signed   By: Marcello Moores  Register   On: 09/11/2016 07:20   Dg Chest Port 1 View  Result Date: 09/09/2016 CLINICAL DATA:  Fall today with multiple facial fractures and intracranial hemorrhage. EXAM: PORTABLE CHEST 1 VIEW COMPARISON:  Chest CT 04/21/2016 FINDINGS: Normal heart size. Mild tortuosity and atherosclerosis of the thoracic aorta. The lungs are clear. Pulmonary vasculature is normal. No consolidation, pleural effusion, or pneumothorax. No acute osseous abnormalities are seen. IMPRESSION: No acute abnormality. Thoracic aortic atherosclerosis. Electronically Signed   By: Jeb Levering M.D.   On: 09/09/2016 22:08   Ct Maxillofacial Wo Contrast  Result Date: 09/09/2016 CLINICAL DATA:  Syncopal episode. Trauma to head. Right periorbital hematoma. Laceration to the right side of the face and head, and upper lip EXAM: CT HEAD WITHOUT CONTRAST CT MAXILLOFACIAL WITHOUT CONTRAST CT CERVICAL SPINE WITHOUT CONTRAST TECHNIQUE: Multidetector CT imaging of the head, cervical spine, and maxillofacial structures were performed using the standard protocol without intravenous contrast. Multiplanar CT image reconstructions of the cervical spine and maxillofacial structures were also generated. COMPARISON:  None. FINDINGS: CT HEAD FINDINGS Brain: Extensive subarachnoid hemorrhage is present within the right sylvian fissure an over the right convexity. There is hemorrhagic contusion involving the right temporal tip and anterior inferior right frontal lobe along the anterior cranial fossa. Extensive subdural hematoma is present along the left side of the falx cerebri. Additional subarachnoid hemorrhage is present over the high left frontal convexity. Additional areas of subarachnoid hemorrhage are present over the lateral left temporal lobe. Vascular: No focal hyperdense vessel is present. Skull: There is a  frontal process fracture of the right maxilla. The calvarium is otherwise intact. Right zygomatic arch is intact. Please see face CT for further detail of right maxillary sinus fracture. Other: Extensive right periorbital hematoma is noted. CT MAXILLOFACIAL FINDINGS Osseous: A comminuted anterior fractures present in the right maxillary sinus. The right maxillary sinus is filled with blood. The frontal process the right maxillary sinus is displaced medially 2 mm. There is also a comminuted distal right nasal bone fracture. Right zygomatic arch is intact. The mandible is intact and located. Orbits: The globes are intact bilaterally. Extensive right periorbital hematoma is present. No other underlying fracture is evident. Sinuses: Hemorrhage fills the right maxillary sinus. The remaining paranasal sinuses and the mastoid air cells are clear. Soft tissues: Extensive hemorrhages soft tissue swelling is present over the right side of the face. This continues to the level the mandible. CT CERVICAL SPINE FINDINGS Alignment: Slight anterolisthesis is present at C4-5. AP alignment is otherwise anatomic. Skull base and vertebrae: The craniocervical junction is intact. Soft tissues and spinal canal: The soft tissues the neck demonstrate bilateral carotid bifurcation atherosclerotic disease. The thyroid is atrophic. No significant adenopathy is present. No focal soft tissue injury is present. There is fatty infiltration of the parotid glands bilaterally. Disc levels: Chronic endplate degenerate change and loss of disc height is most evident at C5-6. Uncovertebral spurring contributes  to moderate foraminal stenosis bilaterally at this level. Multilevel advanced facet hypertrophy is present. There is fusion across the facet joints at C3-4 and C4-5 bilaterally. Moderate facet degenerative changes are present at C2-3, left greater than right. Upper chest: The lung apices are clear. Atherosclerotic changes are present at the aortic  arch without significant aneurysm or stenosis. IMPRESSION: 1. Extensive subarachnoid hemorrhage throughout the right sylvian fissure in lateral left temporal lobe. 2. Hemorrhagic contusion involving the anterior right temporal tip and anterior frontal lobes, right greater than left. 3. Subdural blood along the left side of the falx cerebri. 4. Large right periorbital hematoma with extensive inflammatory changes extending into the right side of the face. 5. Comminuted anterior maxillary sinus fracture with blood throughout the right maxillary sinus. 6. Minimally displaced frontal process maxillary fracture. 7. Minimally displaced anterior right nasal bone fractures. 8. The mandible is intact. 9. No acute abnormality of the cervical spine. 10. Degenerative changes of the cervical spine are most pronounced at C5-6. These results were called by telephone at the time of interpretation on 09/09/2016 at 2:47 pm to Dr. Wyvonnia Dusky , who verbally acknowledged these results. Electronically Signed   By: San Morelle M.D.   On: 09/09/2016 14:48    Cardiac Studies     Patient Profile       Assessment & Plan    82 yo with history of CAD and pulmonary fibrosis was brought to ER after syncope and fall with resulting SDH and SAH.    1. Syncope: Etiology of syncope is unknown. She was standing when she passed out without awareness of palpitations or dizziness. No prior history of syncope.  No arrhythmias since arrival to [facility].  She had some bradycardia. She is known to have CAD with prior MI, stent placement RCA. EKG without ischemia changes. No chest pain. Troponin negative x 2. - Continue to monitor on telemetry.  If no arrhythmias noted in hospital, will arrange a loop recorder before discharge. [facility] monitor) - Echo today   2. CAD: history of DES placement x 2 RCA.  Plavix/ASA on hold with SDH/SAH.  Has been more than a year since last PCI. No objective evidence of ischemia.   3. Facial trauma,  SDH/SAH: Per surgical services.   4. Seizure: Likely due to intracranial bleeding. Per primary team.   Signed, Lauree Chandler, MD  09/11/2016, 7:25 AM

## 2016-09-11 NOTE — Progress Notes (Signed)
[place] Progress Note Patient Name: Kristin Shaffer DOB: 04-Jun-1931 MRN: [id number]   Date of Service  09/11/2016  HPI/Events of Note  Paged NS to be aware of changes and CT  eICU Interventions  CT appears same likely all seizures      Intervention Category Major Interventions: Seizures - evaluation and management  Raylene Miyamoto. 09/11/2016, 6:58 AM

## 2016-09-12 ENCOUNTER — Inpatient Hospital Stay (HOSPITAL_COMMUNITY): Payer: Medicare Other

## 2016-09-12 DIAGNOSIS — I2510 Atherosclerotic heart disease of native coronary artery without angina pectoris: Secondary | ICD-10-CM

## 2016-09-12 DIAGNOSIS — S069X3S Unspecified intracranial injury with loss of consciousness of 1 hour to 5 hours 59 minutes, sequela: Secondary | ICD-10-CM

## 2016-09-12 LAB — BASIC METABOLIC PANEL
ANION GAP: 7 (ref 5–15)
BUN: 11 mg/dL (ref 6–20)
CHLORIDE: 109 mmol/L (ref 101–111)
CO2: 25 mmol/L (ref 22–32)
Calcium: 8.6 mg/dL — ABNORMAL LOW (ref 8.9–10.3)
Creatinine, Ser: 0.79 mg/dL (ref 0.44–1.00)
GFR calc Af Amer: >60 mL/min (ref >60)
Glucose, Bld: 107 mg/dL — ABNORMAL HIGH (ref 65–99)
POTASSIUM: 3.8 mmol/L (ref 3.5–5.1)
SODIUM: 141 mmol/L (ref 135–145)

## 2016-09-12 LAB — CBC WITH DIFFERENTIAL/PLATELET
BASOS ABS: 0 10*3/uL (ref 0.0–0.1)
Basophils Relative: 0 %
EOS ABS: 0.3 10*3/uL (ref 0.0–0.7)
EOS PCT: 4 %
HCT: 37.3 % (ref 36.0–46.0)
HEMOGLOBIN: 12.3 g/dL (ref 12.0–15.0)
LYMPHS ABS: 1.2 10*3/uL (ref 0.7–4.0)
LYMPHS PCT: 15 %
MCH: 29.9 pg (ref 26.0–34.0)
MCHC: 33 g/dL (ref 30.0–36.0)
MCV: 90.8 fL (ref 78.0–100.0)
Monocytes Absolute: 0.8 10*3/uL (ref 0.1–1.0)
Monocytes Relative: 10 %
NEUTROS PCT: 71 %
Neutro Abs: 5.7 10*3/uL (ref 1.7–7.7)
PLATELETS: 208 10*3/uL (ref 150–400)
RBC: 4.11 MIL/uL (ref 3.87–5.11)
RDW: 13.9 % (ref 11.5–15.5)
WBC: 8.1 10*3/uL (ref 4.0–10.5)

## 2016-09-12 MED ORDER — LEVETIRACETAM 500 MG PO TABS
500.0000 mg | ORAL_TABLET | Freq: Two times a day (BID) | ORAL | Status: DC
  Administered 2016-09-12 – 2016-09-13 (×3): 500 mg via ORAL
  Filled 2016-09-12 (×3): qty 1

## 2016-09-12 NOTE — Evaluation (Signed)
Occupational Therapy Evaluation Patient Details Name: Kristin Shaffer MRN: [id number] DOB: 1931-03-29 Today's Date: 09/12/2016    History of Present Illness 82 y.o. female with a PMH significant for CAD s/p NSTEMI with DESx2 to the RCA with later ISR, HTN, dyslipidemia, PHTN, and pulmonary fibrosis who is being admitted to the ICU following a syncopal episode resulting in Lt temporal SDH/SAH and Rt maxillofacial fractures. She had no prodromal signs prior to syncope. Cardiology feels syncope due to arrhythmia.. Re evaluation s/p seizure and intubation/extubation.   Clinical Impression   PT admitted with seizure with intubation. Pt currently with functional limitiations due to the deficits listed below (see OT problem list). PTA was independent with all adls. Pt will benefit from skilled OT to increase their independence and safety with adls and balance to allow discharge CIR.     Follow Up Recommendations  CIR    Equipment Recommendations  3 in 1 bedside comode;Other (comment) (rw)    Recommendations for Other Services Rehab consult     Precautions / Restrictions Precautions Precautions: Fall Precaution Comments: syncope without warning Restrictions Weight Bearing Restrictions: No      Mobility Bed Mobility Overal bed mobility: Needs Assistance Bed Mobility: Supine to Sit     Supine to sit: Min assist     General bed mobility comments: min assist to pull to sit, assist to rotate trunk increased time and effort to perform  Transfers Overall transfer level: Needs assistance Equipment used: 1 person hand held assist Transfers: Sit to/from Stand Sit to Stand: Min assist         General transfer comment: Min assist for stability to power up to standing    Balance Overall balance assessment: Needs assistance Sitting-balance support: No upper extremity supported Sitting balance-Leahy Scale: Good     Standing balance support: No upper extremity  supported Standing balance-Leahy Scale: Fair                              ADL Overall ADL's : Needs assistance/impaired     Grooming: Wash/dry face;Wash/dry hands;Oral care;Minimal assistance;Standing   Upper Body Bathing: Minimal assitance   Lower Body Bathing: Moderate assistance           Toilet Transfer: Minimal assistance   Toileting- Clothing Manipulation and Hygiene: Moderate assistance Toileting - Clothing Manipulation Details (indicate cue type and reason): pt with anterior LOB with peri care. Recommend RN staff stay directly beside patient with peri care. pt fall risk due to anterior reach       General ADL Comments: Pt supine and requesting to transfer to bathroom on arrival. Pt with multiple voids while sitting and prolonged time to complete void. Pt with fall risk during peri care.      Vision Additional Comments: note to have bruising around eyes    Perception     Praxis      Pertinent Vitals/Pain Pain Assessment: Faces Faces Pain Scale: Hurts a little bit Pain Location: R hip Pain Descriptors / Indicators: Discomfort Pain Intervention(s): Monitored during session;Repositioned     Hand Dominance Right   Extremity/Trunk Assessment Upper Extremity Assessment Upper Extremity Assessment: Generalized weakness   Lower Extremity Assessment Lower Extremity Assessment: Defer to PT evaluation   Cervical / Trunk Assessment Cervical / Trunk Assessment: Kyphotic   Communication Communication Communication: No difficulties   Cognition Arousal/Alertness: Awake/alert Behavior During Therapy: WFL for tasks assessed/performed Overall Cognitive Status: Within Functional Limits for tasks assessed  General Comments       Exercises       Shoulder Instructions      Home Living Family/patient expects to be discharged to:: Private residence Living Arrangements: Alone Available Help at Discharge: Family;Available 24  hours/day Type of Home: House Home Access: Stairs to enter [employer] of Steps: 2 Entrance Stairs-Rails: Left Home Layout: Two level;Able to live on main level with bedroom/bathroom     Bathroom Shower/Tub: Tub/shower unit         Home Equipment: Cane - quad;Grab bars - tub/shower;Cane - single point          Prior Functioning/Environment Level of Independence: Independent with assistive device(s)        Comments: typically no device, but occasionally quad cane due to Rt posterior hip/thigh pain (recent diagnosis spinal stenosis); difficulty rising from low surfaces (toilet)        OT Problem List: Decreased strength;Impaired balance (sitting and/or standing);Decreased knowledge of use of DME or AE;Decreased knowledge of precautions;Decreased safety awareness;Pain;Obesity;Decreased activity tolerance   OT Treatment/Interventions: Self-care/ADL training;Therapeutic exercise;DME and/or AE instruction;Therapeutic activities;Balance training;Patient/family education    OT Goals(Current goals can be found in the care plan section) Acute Rehab OT Goals Patient Stated Goal: return home tomorrow OT Goal Formulation: With patient Time For Goal Achievement: 09/26/16 Potential to Achieve Goals: Good  OT Frequency: Min 2X/week   Barriers to D/C:            Co-evaluation              End of Session Equipment Utilized During Treatment: Gait belt;Rolling walker Nurse Communication: Mobility status;Precautions  Activity Tolerance: Patient tolerated treatment well Patient left: Other (comment) (Pt transfering into the hall with patient )   Time: [id number] OT Time Calculation (min): 23 min Charges:  OT General Charges $OT Visit: 1 Procedure OT Evaluation $OT Eval Moderate Complexity: 1 Procedure G-Codes:    Peri Maris 09-19-16, 2:42 PM  Jeri Modena   OTR/L Pager: [phone] Office: [phone] .

## 2016-09-12 NOTE — Progress Notes (Signed)
SUBJECTIVE: No chest pain or dyspnea. NO events overnight.   Tele: sinus, rare PVCs  BP (!) 142/55   Pulse 68   Temp 98.1 F (36.7 C) (Oral)   Resp 19   Ht 5\' 6"  (1.676 m)   Wt 179 lb (81.2 kg)   SpO2 95%   BMI 28.89 kg/m   Intake/Output Summary (Last 24 hours) at 09/12/16 [id number] Last data filed at 09/12/16 0900  Gross per 24 hour  Intake           744.13 ml  Output              830 ml  Net           -85.87 ml    PHYSICAL EXAM General: Well developed, well nourished, in no acute distress. Alert and oriented x 3.  Psych:  Good affect, responds appropriately Neck: No JVD. No masses noted.  Lungs: Clear bilaterally with no wheezes or rhonci noted.  Heart: RRR with no murmurs noted. Abdomen: Bowel sounds are present. Soft, non-tender.  Extremities: No lower extremity edema.   LABS: Basic Metabolic Panel:  Recent Labs  09/11/16 0302 09/12/16 0259  NA 139 141  K 3.9 3.8  CL 109 109  CO2 24 25  GLUCOSE 128* 107*  BUN 17 11  CREATININE 0.98 0.79  CALCIUM 8.9 8.6*   CBC:  Recent Labs  09/09/16 1230  09/11/16 0302 09/12/16 0259  WBC 7.6  < > 7.3 8.1  NEUTROABS 4.9  --   --  5.7  HGB 14.6  < > 12.0 12.3  HCT 43.1  < > 36.1 37.3  MCV 90.0  < > 90.0 90.8  PLT 201  < > 224 208  < > = values in this interval not displayed. Cardiac Enzymes:  Recent Labs  09/10/16 1046 09/11/16 0619  TROPONINI <0.03 <0.03   Fasting Lipid Panel:  Recent Labs  09/11/16 0619  TRIG 94    Current Meds: . atorvastatin  20 mg Oral Daily  . bacitracin   Topical BID  . docusate sodium  100 mg Oral BID  . ezetimibe  5 mg Oral Daily  . levETIRAcetam  500 mg Oral BID  . levothyroxine  100 mcg Oral QAC breakfast  . lisinopril  10 mg Oral Daily  . LORazepam  1 mg Intravenous Once  . metoprolol tartrate  12.5 mg Oral BID  . pantoprazole  40 mg Oral Daily   Echo 09/11/16: Left ventricle: Hyperdynamic LV witth small mid cavitary gradient   The cavity size was normal.  Wall thickness was normal. Systolic   function was vigorous. The estimated ejection fraction was in the   range of 65% to 70%. Wall motion was normal; there were no   regional wall motion abnormalities. Doppler parameters are   consistent with both elevated ventricular end-diastolic filling   pressure and elevated left atrial filling pressure. - Aortic valve: There was trivial regurgitation. - Mitral valve: Calcified annulus. - Atrial septum: No defect or patent foramen ovale was identified. - Pulmonary arteries: PA peak pressure: 37 mm Hg (S).  ASSESSMENT AND PLAN: 82 yo with history of CAD and pulmonary fibrosis was brought to ER after syncope and fall with resulting SDH and SAH.   1. Syncope: Etiology of syncope is unknown. She was standing when she passed out without awareness of palpitations or dizziness. She had taken Flexeril that am which she uses as needed for back pain. No prior history  of syncope. No arrhythmias since arrival to [facility]. She is known to have CAD with prior MI, stent placement RCA. EKG without ischemia changes. No chest pain. Troponin negative x 2. Echo with normal LV function. No significant valvular disease.  - Continue to monitor on telemetry. Will discuss loop recorder placement with EP team.    2. CAD: history of DES placement x 2 RCA. Plavix/ASA on hold with SDH/SAH. Has been more than a year since last PCI. No objective evidence of ischemia.   3. Facial trauma, SDH/SAH: Per surgical services.   4. Seizure: Likely due to intracranial bleeding. Per primary team.    Lauree Chandler  10/17/20179:39 AM

## 2016-09-12 NOTE — Evaluation (Addendum)
Physical Therapy Evaluation Patient Details Name: Kristin Shaffer MRN: [id number] DOB: 04-Oct-1931 Today's Date: 09/12/2016   History of Present Illness  82 y.o. female with a PMH significant for CAD s/p NSTEMI with DESx2 to the RCA with later ISR, HTN, dyslipidemia, PHTN, and pulmonary fibrosis who is being admitted to the ICU following a syncopal episode resulting in Lt temporal SDH/SAH and Rt maxillofacial fractures. She had no prodromal signs prior to syncope. Cardiology feels syncope due to arrhythmia.. Re evaluation s/p seizure and intubation/extubation.  Clinical Impression  Patient seen for re-evaluation s/p seizure intubation/extubation. At this time, patient demonstrates deficits in functional mobility. Patient is a high fall risk. Based on assessment and previous level of mobility highly recommend comprehensive therapies. Recommend CIR upon acute discharge.    Follow Up Recommendations CIR (son to provide 24/7 upon Rehab discharge)    Equipment Recommendations  Rolling walker with 5" wheels;3in1 (PT)    Recommendations for Other Services OT consult     Precautions / Restrictions Precautions Precautions: Fall Precaution Comments: syncope without warning Restrictions Weight Bearing Restrictions: No      Mobility  Bed Mobility Overal bed mobility: Needs Assistance Bed Mobility: Supine to Sit     Supine to sit: Min assist     General bed mobility comments: min assist to pull to sit, assist to rotate trunk increased time and effort to perform  Transfers Overall transfer level: Needs assistance Equipment used: 1 person hand held assist Transfers: Sit to/from Stand Sit to Stand: Min assist         General transfer comment: Min assist for stability to power up to standing  Ambulation/Gait Ambulation/Gait assistance: Min assist;Mod assist (2 episodes of MOD assist when running into wall) Ambulation Distance (Feet): 80 Feet Assistive device: Rolling walker (2  wheeled);1 person hand held assist Gait Pattern/deviations: Step-through pattern;Decreased stride length;Shuffle;Drifts right/left Gait velocity: patient continuously bumping into objects on left side, poor stability with RW Gait velocity interpretation: at or above normal speed for age/gender General Gait Details: less antalgic gait with RW (RW is slightly too tall for pt; with appropriate height anticipate even more improvement in gait) (poor ability to use RW)  Science writer    Modified Rankin (Stroke Patients Only)       Balance   Sitting-balance support: No upper extremity supported Sitting balance-Leahy Scale: Good     Standing balance support: No upper extremity supported Standing balance-Leahy Scale: Fair                               Pertinent Vitals/Pain Pain Assessment: Faces Faces Pain Scale: Hurts a little bit Pain Descriptors / Indicators: Discomfort Pain Intervention(s): Monitored during session    Home Living                        Prior Function                 Hand Dominance        Extremity/Trunk Assessment                         Communication      Cognition Arousal/Alertness: Awake/alert Behavior During Therapy: WFL for tasks assessed/performed Overall Cognitive Status: Within Functional Limits for tasks assessed  General Comments      Exercises     Assessment/Plan    PT Assessment    PT Problem List            PT Treatment Interventions      PT Goals (Current goals can be found in the Care Plan section)  Acute Rehab PT Goals Patient Stated Goal: return home tomorrow PT Goal Formulation: With patient Time For Goal Achievement: 09/17/16 Potential to Achieve Goals: Good    Frequency Min 3X/week   Barriers to discharge        Co-evaluation               End of Session Equipment Utilized During Treatment: Gait  belt Activity Tolerance: Patient tolerated treatment well Patient left: in chair;with call bell/phone within reach;with chair alarm set;with family/visitor present Nurse Communication: Mobility status         Time: [id number] (dove tailed with OT) PT Time Calculation (min) (ACUTE ONLY): 18 min   Charges:   PT Evaluation $PT Re-evaluation: 1 Procedure     PT G CodesDuncan Dull 09/14/2016, 11:06 AM Alben Deeds, PT DPT  [phone]

## 2016-09-12 NOTE — Care Management Note (Signed)
Case Management Note  Patient Details  Name: Kristin Shaffer MRN: [id number] Date of Birth: 1930/12/21  Subjective/Objective: Pt admitted on 09/09/16 s/p fall with SDH, SAH, IPH, Rt maxillary sinus fx., Rt nasal bone fx, Rt periorbital hematoma, and Rt facial hematoma.  PTA, pt independent, lives alone.                    Action/Plan: PT/OT recommending CIR and consult in progress.  Son states can provide 24h care at dc.  Will follow.    Expected Discharge Date:                  Expected Discharge Plan:  [place]  In-House Referral:     Discharge planning Services  CM Consult  Post Acute Care Choice:    Choice offered to:     DME Arranged:    DME Agency:     HH Arranged:    [place] Agency:     Status of Service:  In process, will continue to follow  If discussed at Long Length of Stay Meetings, dates discussed:    Additional Comments:  Reinaldo Raddle, RN, BSN  Trauma/Neuro ICU Case Manager [phone]

## 2016-09-12 NOTE — Consult Note (Signed)
Physical Medicine and Rehabilitation Consult Reason for Consult: Traumatic SDH/SAH as well as maxillofacial fractures Referring Physician: Trauma   HPI: Kristin Shaffer is a 82 y.o. right handed female with history of CAD status post NSTEMI with stenting maintained on Plavix and aspirin, hypertension, pulmonary fibrosis. Per chart review patient lives alone in [place]. Independent with occasional cane and still driving. Son and daughter in the area work. Presented 09/09/2016 after syncopal episode while working at [employer]. Denied any chest pain or shortness of breath. Cranial CT scan showed extensive subarachnoid hemorrhage throughout the right sylvian fissure and lateral left temporal lobe. Hemorrhagic contusion anterior right temporal tip and anterior frontal lobes right greater than left. Large right periorbital hematoma with extensive inflammatory changes. Comminuted anterior maxillary sinus fracture with blood throughout the right maxillary sinus. Minimally displaced frontal process maxillary fracture right nasal bone fracture. CT cervical spine with no acute abnormalities. Troponin negative. Neurosurgery Dr. Cyndy Freeze  advised conservative care. Advised to resume aspirin in 1 week and Plavix in 2 weeks. Auto laryngology Dr. Wilburn Cornelia also advising conservative care of multiple facial fractures. Echocardiogram for workup of syncope showed ejection fraction of 70% no wall motion abnormalities. EEG showed severe diffuse cerebral dysfunction no seizure activity. Maintained on Keppra for seizure prophylaxis. Tolerating a regular diet. Physical therapy evaluation completed 09/12/2016 with recommendations of physical medicine rehabilitation consult.   Review of Systems  Constitutional: Negative for chills and fever.  HENT: Negative for hearing loss.   Eyes: Negative for blurred vision and double vision.  Respiratory: Negative for cough.        Occasional exertional  shortness of breath  Cardiovascular: Positive for palpitations and leg swelling. Negative for chest pain.  Gastrointestinal: Positive for constipation. Negative for nausea and vomiting.       GERD  Genitourinary: Negative for dysuria and hematuria.  Musculoskeletal: Positive for joint pain and myalgias.  Skin: Negative for rash.  Neurological: Positive for dizziness. Negative for headaches.  Psychiatric/Behavioral:       Anxiety  All other systems reviewed and are negative.  Past Medical History:  Diagnosis Date  . Anxiety   . Arthritis    "different places; not bad" (03/20/2013)  . Coronary artery disease   . Exertional shortness of breath   . GERD (gastroesophageal reflux disease)   . High cholesterol   . Hypertension   . Hypothyroidism   . Myocardial infarction    "Dr's saw evidence I might have had a heart attack" (03/20/2013)  . NSTEMI (non-ST elevated myocardial infarction) ([place]) 03/20/2013   cath - mid RCA  . Pneumonia 2012  . Skin cancer    "both legs; right arm" (03/20/2013)   Past Surgical History:  Procedure Laterality Date  . APPENDECTOMY  2003  . BREAST BIOPSY Bilateral 1990s   "total of 5; 2 on one side, 3 on the other; all benign" (03/20/2013)  . CATARACT EXTRACTION W/ INTRAOCULAR LENS  IMPLANT, BILATERAL Bilateral ~ 2008  . CORONARY ANGIOPLASTY  12/08/2013  . CORONARY ANGIOPLASTY WITH STENT PLACEMENT  03/20/2013   NSTEMI - subtotal occlusion of mid RCA - PCI of mid RCA of long tubular 40-60% lesion - 2 tandem 95-99% subtotal occlusions - 3 overlapping Xience Xpedition DES (Dr. Roni Bread)   . [place] OF UTERUS  1956?  Marland Kitchen LEFT HEART CATHETERIZATION WITH CORONARY ANGIOGRAM N/A 03/20/2013   Procedure: LEFT HEART CATHETERIZATION WITH CORONARY ANGIOGRAM;  Surgeon: Sanda Klein, MD;  [facility];  Service: Cardiovascular;  Laterality: N/A;  . LEFT HEART CATHETERIZATION WITH CORONARY ANGIOGRAM N/A 12/08/2013   Procedure: LEFT HEART CATHETERIZATION  WITH CORONARY ANGIOGRAM;  Surgeon: Blane Ohara, MD;  Location: [facility] CATH LAB;  Service: Cardiovascular;  Laterality: N/A;  . SKIN CANCER EXCISION     "1 off right arm; 2 off each leg" (03/20/2013)  . TRANSTHORACIC ECHOCARDIOGRAM  03/2013   EF [id number], grade 1 diastolic dysfunction; mild MR; calcifed MV annulus; LA mildly dilated   Family History  Problem Relation Age of Onset  . Dementia Mother   . Lung cancer Father   . CAD Father 60  . Cancer Brother    Social History:  reports that she has never smoked. She has never used smokeless tobacco. She reports that she does not drink alcohol or use drugs. Allergies:  Allergies  Allergen Reactions  . Atorvastatin Other (See Comments)    Leg weakness and cramps  . Hydrocodone-Acetaminophen Nausea And Vomiting  . Omeprazole Other (See Comments)    Made acid reflux worse  . Zocor [Simvastatin] Other (See Comments)    Trouble Walking, Leg weakness and cramping  . Latex Hives and Rash   Medications Prior to Admission  Medication Sig Dispense Refill  . aspirin EC 81 MG tablet Take 81 mg by mouth daily.    Marland Kitchen atorvastatin (LIPITOR) 20 MG tablet Take 1 tablet (20 mg total) by mouth daily. 90 tablet 3  . Ca Carbonate-Mag Hydroxide (ROLAIDS) 550-110 MG CHEW Chew 1 tablet by mouth daily as needed (heartburn).     . CALCIUM-VITAMIN D PO Take 1 tablet by mouth daily.    . clopidogrel (PLAVIX) 75 MG tablet TAKE 1 TABLET EVERY DAY 90 tablet 2  . cyclobenzaprine (FLEXERIL) 5 MG tablet Take 1 tablet (5 mg total) by mouth 2 (two) times daily as needed for muscle spasms. 20 tablet 0  . ezetimibe (ZETIA) 10 MG tablet Take 5 mg by mouth daily.    . fenofibrate 54 MG tablet Take 1 tablet (54 mg total) by mouth daily. 90 tablet 2  . ibuprofen (ADVIL,MOTRIN) 200 MG tablet Take 200 mg by mouth every 6 (six) hours as needed for moderate pain.    Marland Kitchen levothyroxine (SYNTHROID, LEVOTHROID) 100 MCG tablet Take 100 mcg by mouth daily before breakfast.    . lisinopril  (PRINIVIL,ZESTRIL) 10 MG tablet TAKE 1 TABLET BY MOUTH EVERY DAY 90 tablet 2  . Magnesium Oxide 250 MG TABS Take 1 tablet by mouth daily.    . meloxicam (MOBIC) 7.5 MG tablet Take 1 tablet (7.5 mg total) by mouth daily. 30 tablet 0  . metoprolol tartrate (LOPRESSOR) 25 MG tablet TAKE 1/2 TABLET TWICE DAILY 90 tablet 0  . nitroGLYCERIN (NITROSTAT) 0.4 MG SL tablet Place 1 tablet (0.4 mg total) under the tongue every 5 (five) minutes as needed for chest pain. 30 tablet 2  . potassium chloride SA (K-DUR,KLOR-CON) 20 MEQ tablet Take 1 tablet (20 mEq total) by mouth daily. 30 tablet 10  . HYDROcodone-acetaminophen (NORCO/VICODIN) 5-325 MG tablet Take 1 tablet by mouth every 4 (four) hours as needed. (Patient not taking: Reported on 09/09/2016) 20 tablet 0  . methocarbamol (ROBAXIN) 500 MG tablet Take 1 tablet (500 mg total) by mouth 3 (three) times daily between meals as needed. (Patient not taking: Reported on 09/09/2016) 20 tablet 0    Home: Home Living Family/patient expects to be discharged to:: Private residence Living Arrangements: Alone Available Help at Discharge: Family, Available 24  hours/day (son and his wife in from out of town) Type of Home: House Home Access: Stairs to enter Technical brewer of Steps: 2 Entrance Stairs-Rails: Left Home Layout: Two level, Able to live on main level with bedroom/bathroom Bathroom Shower/Tub: Chiropodist: Standard (counter beside) Home Equipment: Cane - quad, Grab bars - tub/shower, [employer] - single point  Functional History: Prior Function Level of Independence: Independent with assistive device(s) Comments: typically no device, but occasionally quad cane due to Rt posterior hip/thigh pain (recent diagnosis spinal stenosis); difficulty rising from low surfaces (toilet) Functional Status:  Mobility: Bed Mobility Overal bed mobility: Needs Assistance Bed Mobility: Supine to Sit Supine to sit: Min assist General bed  mobility comments: min assist to pull to sit, assist to rotate trunk increased time and effort to perform Transfers Overall transfer level: Needs assistance Equipment used: 1 person hand held assist Transfers: Sit to/from Stand Sit to Stand: Min assist General transfer comment: Min assist for stability to power up to standing Ambulation/Gait Ambulation/Gait assistance: Min assist, Mod assist (2 episodes of MOD assist when running into wall) Ambulation Distance (Feet): 80 Feet Assistive device: Rolling walker (2 wheeled), 1 person hand held assist Gait Pattern/deviations: Step-through pattern, Decreased stride length, Shuffle, Drifts right/left General Gait Details: less antalgic gait with RW (RW is slightly too tall for pt; with appropriate height anticipate even more improvement in gait) (poor ability to use RW) Gait velocity: patient continuously bumping into objects on left side, poor stability with RW Gait velocity interpretation: at or above normal speed for age/gender    ADL:    Cognition: Cognition Overall Cognitive Status: Within Functional Limits for tasks assessed Orientation Level: Oriented X4 Cognition Arousal/Alertness: Awake/alert Behavior During Therapy: WFL for tasks assessed/performed Overall Cognitive Status: Within Functional Limits for tasks assessed  Blood pressure (!) 130/43, pulse 99, temperature 97.9 F (36.6 C), temperature source Oral, resp. rate 17, height 5\' 6"  (1.676 m), weight 81.2 kg (179 lb), SpO2 95 %. Physical Exam  Constitutional: She is oriented to person, place, and time.  HENT:  Multiple bruises and ecchymosis to the face  Eyes:  Pupils round and reactive to light  Neck: Normal range of motion. Neck supple. No thyromegaly present.  Cardiovascular: Normal rate and regular rhythm.   Respiratory: Effort normal and breath sounds normal. No respiratory distress.  GI: Soft. Bowel sounds are normal. She exhibits no distension.  Neurological: She  is alert and oriented to person, place, and time. No cranial nerve deficit. Coordination normal.  Follows commands. Oriented to person. Recalls events leading up to accident. Could recall the month and year. Strength 4/5 in the UE's. LE: 3HF, 4KE and 4+ADF/PF. No sensory findings.   Psychiatric: She has a normal mood and affect. Her behavior is normal. Thought content normal.    Results for orders placed or performed during the hospital encounter of 09/09/16 (from the past 24 hour(s))  CBC with Differential/Platelet     Status: None   Collection Time: 09/12/16  2:59 AM  Result Value Ref Range   WBC 8.1 4.0 - 10.5 K/uL   RBC 4.11 3.87 - 5.11 MIL/uL   Hemoglobin 12.3 12.0 - 15.0 g/dL   HCT 37.3 36.0 - 46.0 %   MCV 90.8 78.0 - 100.0 fL   MCH 29.9 26.0 - 34.0 pg   MCHC 33.0 30.0 - 36.0 g/dL   RDW 13.9 11.5 - 15.5 %   Platelets 208 150 - 400 K/uL   Neutrophils Relative % 71 %  Neutro Abs 5.7 1.7 - 7.7 K/uL   Lymphocytes Relative 15 %   Lymphs Abs 1.2 0.7 - 4.0 K/uL   Monocytes Relative 10 %   Monocytes Absolute 0.8 0.1 - 1.0 K/uL   Eosinophils Relative 4 %   Eosinophils Absolute 0.3 0.0 - 0.7 K/uL   Basophils Relative 0 %   Basophils Absolute 0.0 0.0 - 0.1 K/uL  Basic metabolic panel     Status: Abnormal   Collection Time: 09/12/16  2:59 AM  Result Value Ref Range   Sodium 141 135 - 145 mmol/L   Potassium 3.8 3.5 - 5.1 mmol/L   Chloride 109 101 - 111 mmol/L   CO2 25 22 - 32 mmol/L   Glucose, Bld 107 (H) 65 - 99 mg/dL   BUN 11 6 - 20 mg/dL   Creatinine, Ser 0.79 0.44 - 1.00 mg/dL   Calcium 8.6 (L) 8.9 - 10.3 mg/dL   GFR calc non Af Amer >60 >60 mL/min   GFR calc Af Amer >60 >60 mL/min   Anion gap 7 5 - 15   Ct Head Wo Contrast  Result Date: 09/11/2016 CLINICAL DATA:  81 y/o F; intracranial hemorrhage with seizure requiring intubation. EXAM: CT HEAD WITHOUT CONTRAST TECHNIQUE: Contiguous axial images were obtained from the base of the skull through the vertex without  intravenous contrast. COMPARISON:  09/10/2016 CT of the head. FINDINGS: Brain: Stable right frontal hemorrhagic contusion with mild associated edema and local mass effect. Stable subdural hematoma overlying the left cerebral convexity and along the falx. Stable diffuse subarachnoid hemorrhage and trace intraventricular hemorrhage. No new intracranial hemorrhage is identified. No midline shift or herniation. Ventricle size is stable. Vascular: Extensive calcific atherosclerosis of carotid siphons. Skull: Decreased swelling of the right frontal and periorbital scalp and stable hematoma overlying the left superolateral orbital rim. Sinuses/Orbits: Stable right maxillary sinus fractures and right nasal bone fracture with interval partial dispersion of hemorrhage into the right maxillary sinus. Paranasal sinuses are otherwise normally aerated. Bilateral intra-ocular lens replacement. No displaced orbital fracture or traumatic process of the orbit. Other: Partially visualized endotracheal tube. IMPRESSION: 1. Stable right frontal hemorrhagic contusion, edema, and local mass effect. 2. Stable diffuse subarachnoid hemorrhage. 3. Stable subdural hematoma over the left frontal parietal convexity and along the falx. 4. Stable trace intraventricular hemorrhage. 5. No new intracranial hemorrhage, midline shift, or herniation. 6. Decreased swelling in the right frontal scalp and periorbital soft tissues. Stable right maxillary and right nasal bone fractures and hemorrhage in the right maxillary sinus. Electronically Signed   By: Kristine Garbe M.D.   On: 09/11/2016 06:37   Dg Chest Port 1 View  Result Date: 09/12/2016 CLINICAL DATA:  Respiratory failure and short of breath EXAM: PORTABLE CHEST 1 VIEW COMPARISON:  09/11/2016 FINDINGS: Endotracheal tube has been removed in the interval. Bibasilar airspace disease unchanged. No heart failure or edema. Negative for pleural effusion. IMPRESSION: Mild bibasilar  atelectasis/ infiltrate unchanged. Endotracheal tube removed. Electronically Signed   By: Franchot Gallo M.D.   On: 09/12/2016 08:06   Dg Chest Port 1 View  Result Date: 09/11/2016 CLINICAL DATA:  Respiratory failure. EXAM: PORTABLE CHEST 1 VIEW COMPARISON:  09/09/2016. FINDINGS: Endotracheal tube noted with its tip 4 cm above the carina. Heart size normal. Low lung volumes with basilar atelectasis. A developing infiltrate left lung base cannot be excluded. No pleural effusion or pneumothorax. IMPRESSION: 1. Endotracheal tube tip noted 4 cm above the carina. 2. Low lung volumes with bibasilar atelectasis. A developing  infiltrate the left lung base cannot be excluded. Electronically Signed   By: Marcello Moores  Register   On: 09/11/2016 07:20    Assessment/Plan: Diagnosis: TBI with skull fx's after fall 1. Does the need for close, 24 hr/day medical supervision in concert with the patient's rehab needs make it unreasonable for this patient to be served in a less intensive setting? Yes 2. Co-Morbidities requiring supervision/potential complications: GERD, HTN, PF,  3. Due to bladder management, bowel management, safety, skin/wound care, disease management, medication administration, pain management and patient education, does the patient require 24 hr/day rehab nursing? Yes 4. Does the patient require coordinated care of a physician, rehab nurse, PT (1-2 hrs/day, 5 days/week), OT (1-2 hrs/day, 5 days/week) and SLP (1-2 hrs/day, 5 days/week) to address physical and functional deficits in the context of the above medical diagnosis(es)? Yes Addressing deficits in the following areas: balance, endurance, locomotion, strength, transferring, bowel/bladder control, bathing, dressing, feeding, grooming, toileting, cognition and psychosocial support 5. Can the patient actively participate in an intensive therapy program of at least 3 hrs of therapy per day at least 5 days per week? Yes 6. The potential for patient to  make measurable gains while on inpatient rehab is excellent 7. Anticipated functional outcomes upon discharge from inpatient rehab are modified independent  with PT, modified independent with OT, modified independent with SLP. 8. Estimated rehab length of stay to reach the above functional goals is: 7-10 days 9. Does the patient have adequate social supports and living environment to accommodate these discharge functional goals? Yes 10. Anticipated D/C setting: Home 11. Anticipated post D/C treatments: HH therapy and Outpatient therapy 12. Overall Rehab/Functional Prognosis: excellent  RECOMMENDATIONS: This patient's condition is appropriate for continued rehabilitative care in the following setting: CIR Patient has agreed to participate in recommended program. Yes Note that insurance prior authorization may be required for reimbursement for recommended care.  Comment: Rehab Admissions Coordinator to follow up.  Thanks,  Meredith Staggers, MD, Mellody Drown     09/12/2016

## 2016-09-12 NOTE — Progress Notes (Signed)
Trauma Service Note  Subjective: Patient is awake and alert.  No more seizure activity.  No distress.  Objective: Vital signs in last 24 hours: Temp:  [98.1 F (36.7 C)-98.7 F (37.1 C)] 98.1 F (36.7 C) (10/17 0400) Pulse Rate:  [63-87] 70 (10/17 0800) Resp:  [15-24] 19 (10/17 0800) BP: (101-147)/(42-104) 120/70 (10/17 0800) SpO2:  [92 %-100 %] 94 % (10/17 0800) FiO2 (%):  [40 %] 40 % (10/16 1206) Last BM Date: 09/08/16  Intake/Output from previous day: 10/16 0701 - 10/17 0700 In: 627.4 [P.O.:390; I.V.:27.4; IV Piggyback:100] Out: 830 [Urine:830] Intake/Output this shift: Total I/O In: 115 [Other:10; IV Piggyback:105] Out: -   General: Awake and alert.  Not distress  Lungs: Clear  Abd: Benign.  No BM  Extremities: no changes  Neuro: Intact currently.  Lab Results: CBC   Recent Labs  09/11/16 0302 09/12/16 0259  WBC 7.3 8.1  HGB 12.0 12.3  HCT 36.1 37.3  PLT 224 208   BMET  Recent Labs  09/11/16 0302 09/12/16 0259  NA 139 141  K 3.9 3.8  CL 109 109  CO2 24 25  GLUCOSE 128* 107*  BUN 17 11  CREATININE 0.98 0.79  CALCIUM 8.9 8.6*   PT/INR  Recent Labs  09/09/16 1230 09/10/16 0233  LABPROT 12.6 13.1  INR 0.95 0.99   ABG  Recent Labs  09/11/16 0600  PHART 7.394  HCO3 21.0    Studies/Results: Ct Head Wo Contrast  Result Date: 09/11/2016 CLINICAL DATA:  82 y/o F; intracranial hemorrhage with seizure requiring intubation. EXAM: CT HEAD WITHOUT CONTRAST TECHNIQUE: Contiguous axial images were obtained from the base of the skull through the vertex without intravenous contrast. COMPARISON:  09/10/2016 CT of the head. FINDINGS: Brain: Stable right frontal hemorrhagic contusion with mild associated edema and local mass effect. Stable subdural hematoma overlying the left cerebral convexity and along the falx. Stable diffuse subarachnoid hemorrhage and trace intraventricular hemorrhage. No new intracranial hemorrhage is identified. No midline  shift or herniation. Ventricle size is stable. Vascular: Extensive calcific atherosclerosis of carotid siphons. Skull: Decreased swelling of the right frontal and periorbital scalp and stable hematoma overlying the left superolateral orbital rim. Sinuses/Orbits: Stable right maxillary sinus fractures and right nasal bone fracture with interval partial dispersion of hemorrhage into the right maxillary sinus. Paranasal sinuses are otherwise normally aerated. Bilateral intra-ocular lens replacement. No displaced orbital fracture or traumatic process of the orbit. Other: Partially visualized endotracheal tube. IMPRESSION: 1. Stable right frontal hemorrhagic contusion, edema, and local mass effect. 2. Stable diffuse subarachnoid hemorrhage. 3. Stable subdural hematoma over the left frontal parietal convexity and along the falx. 4. Stable trace intraventricular hemorrhage. 5. No new intracranial hemorrhage, midline shift, or herniation. 6. Decreased swelling in the right frontal scalp and periorbital soft tissues. Stable right maxillary and right nasal bone fractures and hemorrhage in the right maxillary sinus. Electronically Signed   By: Kristine Garbe M.D.   On: 09/11/2016 06:37   Dg Chest Port 1 View  Result Date: 09/12/2016 CLINICAL DATA:  Respiratory failure and short of breath EXAM: PORTABLE CHEST 1 VIEW COMPARISON:  09/11/2016 FINDINGS: Endotracheal tube has been removed in the interval. Bibasilar airspace disease unchanged. No heart failure or edema. Negative for pleural effusion. IMPRESSION: Mild bibasilar atelectasis/ infiltrate unchanged. Endotracheal tube removed. Electronically Signed   By: Franchot Gallo M.D.   On: 09/12/2016 08:06   Dg Chest Port 1 View  Result Date: 09/11/2016 CLINICAL DATA:  Respiratory failure. EXAM: PORTABLE CHEST  1 VIEW COMPARISON:  09/09/2016. FINDINGS: Endotracheal tube noted with its tip 4 cm above the carina. Heart size normal. Low lung volumes with basilar  atelectasis. A developing infiltrate left lung base cannot be excluded. No pleural effusion or pneumothorax. IMPRESSION: 1. Endotracheal tube tip noted 4 cm above the carina. 2. Low lung volumes with bibasilar atelectasis. A developing infiltrate the left lung base cannot be excluded. Electronically Signed   By: Marcello Moores  Register   On: 09/11/2016 07:20    Anti-infectives: Anti-infectives    Start     Dose/Rate Route Frequency Ordered Stop   09/09/16 1545  ceFAZolin (ANCEF) IVPB 1 g/50 mL premix     1 g 100 mL/hr over 30 Minutes Intravenous  Once 09/09/16 1544 09/09/16 1820      Assessment/Plan: s/p  Echocardiogram without any glaring abnormality.  Cardiology to follow.  Transfer to the 38M/5C with telemetry.  LOS: 3 days   Kathryne Eriksson. Dahlia Bailiff, MD, FACS [phone] Trauma Surgeon 09/12/2016

## 2016-09-12 NOTE — Progress Notes (Signed)
Inpatient Rehabilitation  PT is now recommending IP Rehab.  We are recommending an IP Rehab consult at this time.  Please order if you are agreeable.    [place] Admissions Coordinator Cell [phone] Office [phone]

## 2016-09-13 ENCOUNTER — Inpatient Hospital Stay (HOSPITAL_COMMUNITY)
Admission: RE | Admit: 2016-09-13 | Discharge: 2016-09-25 | DRG: 949 | Disposition: A | Discharge status: Home Health | Payer: Medicare Other | Source: Intra-hospital | Attending: Physical Medicine & Rehabilitation | Admitting: Physical Medicine & Rehabilitation

## 2016-09-13 ENCOUNTER — Encounter (HOSPITAL_COMMUNITY): Payer: Self-pay | Admitting: *Deleted

## 2016-09-13 DIAGNOSIS — Z791 Long term (current) use of non-steroidal anti-inflammatories (NSAID): Secondary | ICD-10-CM

## 2016-09-13 DIAGNOSIS — G4700 Insomnia, unspecified: Secondary | ICD-10-CM

## 2016-09-13 DIAGNOSIS — I2510 Atherosclerotic heart disease of native coronary artery without angina pectoris: Secondary | ICD-10-CM

## 2016-09-13 DIAGNOSIS — N390 Urinary tract infection, site not specified: Secondary | ICD-10-CM

## 2016-09-13 DIAGNOSIS — S0240CD Maxillary fracture, right side, subsequent encounter for fracture with routine healing: Secondary | ICD-10-CM

## 2016-09-13 DIAGNOSIS — J8410 Pulmonary fibrosis, unspecified: Secondary | ICD-10-CM

## 2016-09-13 DIAGNOSIS — B9620 Unspecified Escherichia coli [E. coli] as the cause of diseases classified elsewhere: Secondary | ICD-10-CM | POA: Diagnosis not present

## 2016-09-13 DIAGNOSIS — Z298 Encounter for other specified prophylactic measures: Secondary | ICD-10-CM

## 2016-09-13 DIAGNOSIS — S022XXD Fracture of nasal bones, subsequent encounter for fracture with routine healing: Secondary | ICD-10-CM

## 2016-09-13 DIAGNOSIS — W19XXXD Unspecified fall, subsequent encounter: Secondary | ICD-10-CM

## 2016-09-13 DIAGNOSIS — R0989 Other specified symptoms and signs involving the circulatory and respiratory systems: Secondary | ICD-10-CM | POA: Diagnosis not present

## 2016-09-13 DIAGNOSIS — S069XAS Unspecified intracranial injury with loss of consciousness status unknown, sequela: Secondary | ICD-10-CM

## 2016-09-13 DIAGNOSIS — Y9289 Other specified places as the place of occurrence of the external cause: Secondary | ICD-10-CM

## 2016-09-13 DIAGNOSIS — Z7902 Long term (current) use of antithrombotics/antiplatelets: Secondary | ICD-10-CM | POA: Diagnosis not present

## 2016-09-13 DIAGNOSIS — Z955 Presence of coronary angioplasty implant and graft: Secondary | ICD-10-CM | POA: Diagnosis not present

## 2016-09-13 DIAGNOSIS — I252 Old myocardial infarction: Secondary | ICD-10-CM | POA: Diagnosis not present

## 2016-09-13 DIAGNOSIS — S066X9D Traumatic subarachnoid hemorrhage with loss of consciousness of unspecified duration, subsequent encounter: Principal | ICD-10-CM

## 2016-09-13 DIAGNOSIS — I609 Nontraumatic subarachnoid hemorrhage, unspecified: Secondary | ICD-10-CM

## 2016-09-13 DIAGNOSIS — S065X9D Traumatic subdural hemorrhage with loss of consciousness of unspecified duration, subsequent encounter: Secondary | ICD-10-CM | POA: Diagnosis not present

## 2016-09-13 DIAGNOSIS — S02401S Maxillary fracture, unspecified, sequela: Secondary | ICD-10-CM

## 2016-09-13 DIAGNOSIS — S069XAA Unspecified intracranial injury with loss of consciousness status unknown, initial encounter: Secondary | ICD-10-CM | POA: Diagnosis present

## 2016-09-13 DIAGNOSIS — G479 Sleep disorder, unspecified: Secondary | ICD-10-CM | POA: Diagnosis not present

## 2016-09-13 DIAGNOSIS — I629 Nontraumatic intracranial hemorrhage, unspecified: Secondary | ICD-10-CM | POA: Diagnosis not present

## 2016-09-13 DIAGNOSIS — N179 Acute kidney failure, unspecified: Secondary | ICD-10-CM

## 2016-09-13 DIAGNOSIS — Z7982 Long term (current) use of aspirin: Secondary | ICD-10-CM

## 2016-09-13 DIAGNOSIS — E785 Hyperlipidemia, unspecified: Secondary | ICD-10-CM

## 2016-09-13 DIAGNOSIS — G3189 Other specified degenerative diseases of nervous system: Secondary | ICD-10-CM

## 2016-09-13 DIAGNOSIS — I10 Essential (primary) hypertension: Secondary | ICD-10-CM

## 2016-09-13 DIAGNOSIS — I25810 Atherosclerosis of coronary artery bypass graft(s) without angina pectoris: Secondary | ICD-10-CM

## 2016-09-13 DIAGNOSIS — Z2989 Encounter for other specified prophylactic measures: Secondary | ICD-10-CM

## 2016-09-13 DIAGNOSIS — S069X3S Unspecified intracranial injury with loss of consciousness of 1 hour to 5 hours 59 minutes, sequela: Secondary | ICD-10-CM

## 2016-09-13 DIAGNOSIS — S065XAA Traumatic subdural hemorrhage with loss of consciousness status unknown, initial encounter: Secondary | ICD-10-CM

## 2016-09-13 DIAGNOSIS — S069X9A Unspecified intracranial injury with loss of consciousness of unspecified duration, initial encounter: Secondary | ICD-10-CM | POA: Diagnosis present

## 2016-09-13 DIAGNOSIS — Z79899 Other long term (current) drug therapy: Secondary | ICD-10-CM | POA: Diagnosis not present

## 2016-09-13 DIAGNOSIS — W19XXXA Unspecified fall, initial encounter: Secondary | ICD-10-CM | POA: Diagnosis present

## 2016-09-13 DIAGNOSIS — Z85828 Personal history of other malignant neoplasm of skin: Secondary | ICD-10-CM | POA: Diagnosis not present

## 2016-09-13 DIAGNOSIS — S069X0S Unspecified intracranial injury without loss of consciousness, sequela: Secondary | ICD-10-CM

## 2016-09-13 DIAGNOSIS — F09 Unspecified mental disorder due to known physiological condition: Secondary | ICD-10-CM | POA: Diagnosis not present

## 2016-09-13 DIAGNOSIS — W19XXXS Unspecified fall, sequela: Secondary | ICD-10-CM

## 2016-09-13 DIAGNOSIS — E039 Hypothyroidism, unspecified: Secondary | ICD-10-CM

## 2016-09-13 DIAGNOSIS — S065X9A Traumatic subdural hemorrhage with loss of consciousness of unspecified duration, initial encounter: Secondary | ICD-10-CM

## 2016-09-13 DIAGNOSIS — S02401A Maxillary fracture, unspecified, initial encounter for closed fracture: Secondary | ICD-10-CM

## 2016-09-13 DIAGNOSIS — R55 Syncope and collapse: Secondary | ICD-10-CM

## 2016-09-13 MED ORDER — LEVETIRACETAM 500 MG PO TABS
500.0000 mg | ORAL_TABLET | Freq: Two times a day (BID) | ORAL | Status: DC
  Administered 2016-09-13 – 2016-09-18 (×10): 500 mg via ORAL
  Filled 2016-09-13 (×10): qty 1

## 2016-09-13 MED ORDER — SORBITOL 70 % SOLN
30.0000 mL | Freq: Every day | Status: DC | PRN
  Administered 2016-09-22 – 2016-09-23 (×2): 30 mL via ORAL
  Filled 2016-09-13 (×2): qty 30

## 2016-09-13 MED ORDER — ATORVASTATIN CALCIUM 20 MG PO TABS
20.0000 mg | ORAL_TABLET | Freq: Every day | ORAL | Status: DC
  Administered 2016-09-14 – 2016-09-25 (×12): 20 mg via ORAL
  Filled 2016-09-13 (×12): qty 1

## 2016-09-13 MED ORDER — CYCLOBENZAPRINE HCL 5 MG PO TABS
5.0000 mg | ORAL_TABLET | Freq: Two times a day (BID) | ORAL | Status: DC | PRN
  Administered 2016-09-17: 5 mg via ORAL
  Filled 2016-09-13: qty 1

## 2016-09-13 MED ORDER — EZETIMIBE 10 MG PO TABS
5.0000 mg | ORAL_TABLET | Freq: Every day | ORAL | Status: DC
  Administered 2016-09-14 – 2016-09-25 (×12): 5 mg via ORAL
  Filled 2016-09-13 (×12): qty 1

## 2016-09-13 MED ORDER — NITROGLYCERIN 0.4 MG SL SUBL
0.4000 mg | SUBLINGUAL_TABLET | SUBLINGUAL | Status: DC | PRN
Start: 2016-09-13 — End: 2016-09-25

## 2016-09-13 MED ORDER — METOPROLOL TARTRATE 12.5 MG HALF TABLET
12.5000 mg | ORAL_TABLET | Freq: Two times a day (BID) | ORAL | Status: DC
  Administered 2016-09-13: 12.5 mg via ORAL
  Filled 2016-09-13: qty 1

## 2016-09-13 MED ORDER — ACETAMINOPHEN 325 MG PO TABS
650.0000 mg | ORAL_TABLET | ORAL | Status: DC | PRN
  Administered 2016-09-15 – 2016-09-24 (×11): 650 mg via ORAL
  Filled 2016-09-13 (×11): qty 2

## 2016-09-13 MED ORDER — LISINOPRIL 10 MG PO TABS
10.0000 mg | ORAL_TABLET | Freq: Every day | ORAL | Status: DC
  Administered 2016-09-14 – 2016-09-25 (×12): 10 mg via ORAL
  Filled 2016-09-13 (×12): qty 1

## 2016-09-13 MED ORDER — DOCUSATE SODIUM 100 MG PO CAPS
100.0000 mg | ORAL_CAPSULE | Freq: Two times a day (BID) | ORAL | Status: DC
  Administered 2016-09-13 – 2016-09-25 (×24): 100 mg via ORAL
  Filled 2016-09-13 (×24): qty 1

## 2016-09-13 MED ORDER — ONDANSETRON HCL 4 MG PO TABS: 4.0000 mg | ORAL_TABLET | Freq: Four times a day (QID) | ORAL | Status: DC | PRN

## 2016-09-13 MED ORDER — PANTOPRAZOLE SODIUM 40 MG PO TBEC
40.0000 mg | DELAYED_RELEASE_TABLET | Freq: Every day | ORAL | Status: DC
  Administered 2016-09-14 – 2016-09-25 (×12): 40 mg via ORAL
  Filled 2016-09-13 (×12): qty 1

## 2016-09-13 MED ORDER — ONDANSETRON HCL 4 MG/2ML IJ SOLN: 4.0000 mg | Freq: Four times a day (QID) | INTRAMUSCULAR | Status: DC | PRN

## 2016-09-13 MED ORDER — LEVOTHYROXINE SODIUM 100 MCG PO TABS
100.0000 ug | ORAL_TABLET | Freq: Every day | ORAL | Status: DC
  Administered 2016-09-14 – 2016-09-25 (×12): 100 ug via ORAL
  Filled 2016-09-13 (×12): qty 1

## 2016-09-13 MED ORDER — BACITRACIN ZINC 500 UNIT/GM EX OINT
TOPICAL_OINTMENT | Freq: Two times a day (BID) | CUTANEOUS | Status: DC
  Administered 2016-09-13 – 2016-09-25 (×16): via TOPICAL
  Filled 2016-09-13: qty 28.35

## 2016-09-13 NOTE — Progress Notes (Signed)
     SUBJECTIVE: No chest pain, dyspnea, dizziness.   Tele: sinus  BP (!) 134/52 (BP Location: Right Arm)   Pulse 80   Temp 98 F (36.7 C) (Oral)   Resp 18   Ht 5\' 6"  (1.676 m)   Wt 179 lb (81.2 kg)   SpO2 95%   BMI 28.89 kg/m  No intake or output data in the 24 hours ending 09/13/16 1229  PHYSICAL EXAM General: Well developed, well nourished, in no acute distress. Alert and oriented x 3.  Psych:  Good affect, responds appropriately Neck: No JVD. No masses noted.  Lungs: Clear bilaterally with no wheezes or rhonci noted.  Heart: RRR with no murmurs noted. Abdomen: Bowel sounds are present. Soft, non-tender.  Extremities: No lower extremity edema.   LABS: Basic Metabolic Panel:  Recent Labs  09/11/16 0302 09/12/16 0259  NA 139 141  K 3.9 3.8  CL 109 109  CO2 24 25  GLUCOSE 128* 107*  BUN 17 11  CREATININE 0.98 0.79  CALCIUM 8.9 8.6*   CBC:  Recent Labs  09/11/16 0302 09/12/16 0259  WBC 7.3 8.1  NEUTROABS  --  5.7  HGB 12.0 12.3  HCT 36.1 37.3  MCV 90.0 90.8  PLT 224 208   Cardiac Enzymes:  Recent Labs  09/11/16 0619  TROPONINI <0.03   Fasting Lipid Panel:  Recent Labs  09/11/16 0619  TRIG 94    Current Meds: . atorvastatin  20 mg Oral Daily  . bacitracin   Topical BID  . docusate sodium  100 mg Oral BID  . ezetimibe  5 mg Oral Daily  . levETIRAcetam  500 mg Oral BID  . levothyroxine  100 mcg Oral QAC breakfast  . lisinopril  10 mg Oral Daily  . LORazepam  1 mg Intravenous Once  . metoprolol tartrate  12.5 mg Oral BID  . pantoprazole  40 mg Oral Daily     ASSESSMENT AND PLAN: 82 yo female with history of CAD and pulmonary fibrosis was brought to ER after syncope and fall with resulting SDH and SAH.   1. Syncope:Etiology of syncope is unknown. She was standing when she passed out without awareness of palpitations or dizziness. She had taken Flexeril that am which she uses as needed for back pain. No prior history of syncope. No  arrhythmias since arrival to [facility]. Tele with normal sinus rhythm.She is known to have CAD with prior MI, stent placement RCA. EKG without ischemia changes. No chest pain. Troponin negative x 2. Echo with normal LV function. No significant valvular disease.  - I have reviewed her case and presentation with our EP team. Given her overall normal EKG with no bundle branch block, fascicular block and no recurrent arrhythmias, Loop recorder is not indicated after the single syncopal event. Will arrange 30 day event monitor after discharge.   2. [id number] of DES placement x 2RCA. Plavix/ASA on hold with SDH/SAH. Has been more than a year since last PCI. No objective evidence of ischemia.   3. Facial trauma, SDH/SAH:Per surgical services.        Lauree Chandler  10/18/201712:29 PM

## 2016-09-13 NOTE — Progress Notes (Signed)
Called in report to receiving nurse Chelsey in rehab

## 2016-09-13 NOTE — H&P (Signed)
Physical Medicine and Rehabilitation Admission H&P    Chief Complaint  Patient presents with  . Fall  . Head Injury  . Facial Swelling  : HPI: Kristin Shaffer is a 82 y.o. right handed female with history of CAD status post NSTEMI with stenting maintained on Plavix and aspirin, hypertension, pulmonary fibrosis. Per chart review patient lives alone in [place]. Independent with occasional cane and still driving. Son and daughter in the area work. Presented 09/09/2016 after syncopal episode while working at [employer]. Denied any chest pain or shortness of breath. Cranial CT scan showed extensive subarachnoid hemorrhage throughout the right sylvian fissure and lateral left temporal lobe. Hemorrhagic contusion anterior right temporal tip and anterior frontal lobes right greater than left. Large right periorbital hematoma with extensive inflammatory changes. Comminuted anterior maxillary sinus fracture with blood throughout the right maxillary sinus. Minimally displaced frontal process maxillary fracture right nasal bone fracture. CT cervical spine with no acute abnormalities. Troponin negative. Neurosurgery Dr. Cyndy Freeze  advised conservative care. Advised to resume aspirin in 1 week and Plavix in 2 weeks. Auto laryngology Dr. Wilburn Cornelia also advising conservative care of multiple facial fractures. Echocardiogram for workup of syncope showed ejection fraction of 70% no wall motion abnormalities. EEG showed severe diffuse cerebral dysfunction no seizure activity. Maintained on Keppra for seizure prophylaxis. Tolerating a regular diet. Physical and occupational therapy evaluation completed 09/12/2016 with recommendations of physical medicine rehabilitation consult.Patient was admitted for a comprehensive rehabilitation program  ROS Constitutional: Negative for chills and fever.  HENT: Negative for hearing loss.   Eyes: Negative for blurred vision and double vision.  Respiratory:  Negative for cough.        Occasional exertional shortness of breath  Cardiovascular: Negative for chest pain.  Gastrointestinal: Positive for constipation. Negative for nausea and vomiting.       GERD  Genitourinary: Negative for dysuria and hematuria.  Musculoskeletal: Positive for joint pain.  Skin: Negative for rash.  Neurological: Positive for dizziness. Negative for headaches.  Psychiatric/Behavioral:       Anxiety  All other systems reviewed and are negative   Past Medical History:  Diagnosis Date  . Anxiety   . Arthritis    "different places; not bad" (03/20/2013)  . Coronary artery disease   . Exertional shortness of breath   . GERD (gastroesophageal reflux disease)   . High cholesterol   . Hypertension   . Hypothyroidism   . Myocardial infarction    "Dr's saw evidence I might have had a heart attack" (03/20/2013)  . NSTEMI (non-ST elevated myocardial infarction) ([place]) 03/20/2013   cath - mid RCA  . Pneumonia 2012  . Skin cancer    "both legs; right arm" (03/20/2013)   Past Surgical History:  Procedure Laterality Date  . APPENDECTOMY  2003  . BREAST BIOPSY Bilateral 1990s   "total of 5; 2 on one side, 3 on the other; all benign" (03/20/2013)  . CATARACT EXTRACTION W/ INTRAOCULAR LENS  IMPLANT, BILATERAL Bilateral ~ 2008  . CORONARY ANGIOPLASTY  12/08/2013  . CORONARY ANGIOPLASTY WITH STENT PLACEMENT  03/20/2013   NSTEMI - subtotal occlusion of mid RCA - PCI of mid RCA of long tubular 40-60% lesion - 2 tandem 95-99% subtotal occlusions - 3 overlapping Xience Xpedition DES (Dr. Roni Bread)   . [place] OF UTERUS  1956?  Marland Kitchen LEFT HEART CATHETERIZATION WITH CORONARY ANGIOGRAM N/A 03/20/2013   Procedure: LEFT HEART CATHETERIZATION WITH CORONARY ANGIOGRAM;  Surgeon: Dani Gobble Croitoru,  MD;  Location: [facility] CATH LAB;  Service: Cardiovascular;  Laterality: N/A;  . LEFT HEART CATHETERIZATION WITH CORONARY ANGIOGRAM N/A 12/08/2013   Procedure: LEFT HEART CATHETERIZATION WITH  CORONARY ANGIOGRAM;  Surgeon: Blane Ohara, MD;  Location: [facility] CATH LAB;  Service: Cardiovascular;  Laterality: N/A;  . SKIN CANCER EXCISION     "1 off right arm; 2 off each leg" (03/20/2013)  . TRANSTHORACIC ECHOCARDIOGRAM  03/2013   EF 45-40%, grade 1 diastolic dysfunction; mild MR; calcifed MV annulus; LA mildly dilated   Family History  Problem Relation Age of Onset  . Dementia Mother   . Lung cancer Father   . CAD Father 43  . Cancer Brother    Social History:  reports that she has never smoked. She has never used smokeless tobacco. She reports that she does not drink alcohol or use drugs. Allergies:  Allergies  Allergen Reactions  . Atorvastatin Other (See Comments)    Leg weakness and cramps  . Hydrocodone-Acetaminophen Nausea And Vomiting  . Omeprazole Other (See Comments)    Made acid reflux worse  . Zocor [Simvastatin] Other (See Comments)    Trouble Walking, Leg weakness and cramping  . Latex Hives and Rash   Medications Prior to Admission  Medication Sig Dispense Refill  . aspirin EC 81 MG tablet Take 81 mg by mouth daily.    Marland Kitchen atorvastatin (LIPITOR) 20 MG tablet Take 1 tablet (20 mg total) by mouth daily. 90 tablet 3  . Ca Carbonate-Mag Hydroxide (ROLAIDS) 550-110 MG CHEW Chew 1 tablet by mouth daily as needed (heartburn).     . CALCIUM-VITAMIN D PO Take 1 tablet by mouth daily.    . clopidogrel (PLAVIX) 75 MG tablet TAKE 1 TABLET EVERY DAY 90 tablet 2  . cyclobenzaprine (FLEXERIL) 5 MG tablet Take 1 tablet (5 mg total) by mouth 2 (two) times daily as needed for muscle spasms. 20 tablet 0  . ezetimibe (ZETIA) 10 MG tablet Take 5 mg by mouth daily.    . fenofibrate 54 MG tablet Take 1 tablet (54 mg total) by mouth daily. 90 tablet 2  . ibuprofen (ADVIL,MOTRIN) 200 MG tablet Take 200 mg by mouth every 6 (six) hours as needed for moderate pain.    Marland Kitchen levothyroxine (SYNTHROID, LEVOTHROID) 100 MCG tablet Take 100 mcg by mouth daily before breakfast.    . lisinopril  (PRINIVIL,ZESTRIL) 10 MG tablet TAKE 1 TABLET BY MOUTH EVERY DAY 90 tablet 2  . Magnesium Oxide 250 MG TABS Take 1 tablet by mouth daily.    . meloxicam (MOBIC) 7.5 MG tablet Take 1 tablet (7.5 mg total) by mouth daily. 30 tablet 0  . metoprolol tartrate (LOPRESSOR) 25 MG tablet TAKE 1/2 TABLET TWICE DAILY 90 tablet 0  . nitroGLYCERIN (NITROSTAT) 0.4 MG SL tablet Place 1 tablet (0.4 mg total) under the tongue every 5 (five) minutes as needed for chest pain. 30 tablet 2  . potassium chloride SA (K-DUR,KLOR-CON) 20 MEQ tablet Take 1 tablet (20 mEq total) by mouth daily. 30 tablet 10  . HYDROcodone-acetaminophen (NORCO/VICODIN) 5-325 MG tablet Take 1 tablet by mouth every 4 (four) hours as needed. (Patient not taking: Reported on 09/09/2016) 20 tablet 0  . methocarbamol (ROBAXIN) 500 MG tablet Take 1 tablet (500 mg total) by mouth 3 (three) times daily between meals as needed. (Patient not taking: Reported on 09/09/2016) 20 tablet 0    Home: Home Living Family/patient expects to be discharged to:: Private residence Living Arrangements: Alone Available Help at  Discharge: Family, Available 24 hours/day Type of Home: House Home Access: Stairs to enter [employer] of Steps: 2 Entrance Stairs-Rails: Left Home Layout: Two level, Able to live on main level with bedroom/bathroom Bathroom Shower/Tub: Chiropodist: Standard (counter beside) Home Equipment: Cane - quad, Grab bars - tub/shower, [employer] - single point   Functional History: Prior Function Level of Independence: Independent with assistive device(s) Comments: typically no device, but occasionally quad cane due to Rt posterior hip/thigh pain (recent diagnosis spinal stenosis); difficulty rising from low surfaces (toilet)  Functional Status:  Mobility: Bed Mobility Overal bed mobility: Needs Assistance Bed Mobility: Supine to Sit Supine to sit: Min assist General bed mobility comments: min assist to pull to  sit, assist to rotate trunk increased time and effort to perform Transfers Overall transfer level: Needs assistance Equipment used: 1 person hand held assist Transfers: Sit to/from Stand Sit to Stand: Min assist General transfer comment: Min assist for stability to power up to standing Ambulation/Gait Ambulation/Gait assistance: Min assist, Mod assist (2 episodes of MOD assist when running into wall) Ambulation Distance (Feet): 80 Feet Assistive device: Rolling walker (2 wheeled), 1 person hand held assist Gait Pattern/deviations: Step-through pattern, Decreased stride length, Shuffle, Drifts right/left General Gait Details: less antalgic gait with RW (RW is slightly too tall for pt; with appropriate height anticipate even more improvement in gait) (poor ability to use RW) Gait velocity: patient continuously bumping into objects on left side, poor stability with RW Gait velocity interpretation: at or above normal speed for age/gender    ADL: ADL Overall ADL's : Needs assistance/impaired Grooming: Wash/dry face, Wash/dry hands, Oral care, Minimal assistance, Standing Upper Body Bathing: Minimal assitance Lower Body Bathing: Moderate assistance Toilet Transfer: Minimal assistance Toileting- Clothing Manipulation and Hygiene: Moderate assistance Toileting - Clothing Manipulation Details (indicate cue type and reason): pt with anterior LOB with peri care. Recommend RN staff stay directly beside patient with peri care. pt fall risk due to anterior reach General ADL Comments: Pt supine and requesting to transfer to bathroom on arrival. Pt with multiple voids while sitting and prolonged time to complete void. Pt with fall risk during peri care.   Cognition: Cognition Overall Cognitive Status: Within Functional Limits for tasks assessed Orientation Level: Oriented to person, Oriented to place, Disoriented to time Cognition Arousal/Alertness: Awake/alert Behavior During Therapy: WFL for  tasks assessed/performed Overall Cognitive Status: Within Functional Limits for tasks assessed  Physical Exam: Blood pressure (!) 155/62, pulse 88, temperature 98 F (36.7 C), temperature source Axillary, resp. rate 19, height '5\' 6"'  (1.676 m), weight 81.2 kg (179 lb), SpO2 98 %. Physical Exam Constitutional: She is oriented to person, place, and time. NAD. HENT:  Multiple bruises and ecchymosis to the face  Eyes:  Pupils round and reactive to light. No drainage. Neck: Normal range of motion. Neck supple. No thyromegaly present.  Cardiovascular: Normal rate and regular rhythm.   Respiratory: Effort normal and breath sounds normal. No respiratory distress.  GI: Soft. Bowel sounds are normal. She exhibits no distension.  Neurological: She is alert and oriented to person, place, and time. No cranial nerve deficit. Coordination normal.  Follows commands.  Recalls events leading up to accident.  Strength: B/l UE 4/5  B/l LE: 4/5 HF, 4+/5 KE and 4+/5 ADF/PF.  DTRs symmetric. Psychiatric: She has a normal mood and affect. Her behavior is normal. Thought content normal   Results for orders placed or performed during the hospital encounter of 09/09/16 (from the past 48  hour(s))  CBC with Differential/Platelet     Status: None   Collection Time: 09/12/16  2:59 AM  Result Value Ref Range   WBC 8.1 4.0 - 10.5 K/uL   RBC 4.11 3.87 - 5.11 MIL/uL   Hemoglobin 12.3 12.0 - 15.0 g/dL   HCT 37.3 36.0 - 46.0 %   MCV 90.8 78.0 - 100.0 fL   MCH 29.9 26.0 - 34.0 pg   MCHC 33.0 30.0 - 36.0 g/dL   RDW 13.9 11.5 - 15.5 %   Platelets 208 150 - 400 K/uL   Neutrophils Relative % 71 %   Neutro Abs 5.7 1.7 - 7.7 K/uL   Lymphocytes Relative 15 %   Lymphs Abs 1.2 0.7 - 4.0 K/uL   Monocytes Relative 10 %   Monocytes Absolute 0.8 0.1 - 1.0 K/uL   Eosinophils Relative 4 %   Eosinophils Absolute 0.3 0.0 - 0.7 K/uL   Basophils Relative 0 %   Basophils Absolute 0.0 0.0 - 0.1 K/uL  Basic metabolic panel      Status: Abnormal   Collection Time: 09/12/16  2:59 AM  Result Value Ref Range   Sodium 141 135 - 145 mmol/L   Potassium 3.8 3.5 - 5.1 mmol/L   Chloride 109 101 - 111 mmol/L   CO2 25 22 - 32 mmol/L   Glucose, Bld 107 (H) 65 - 99 mg/dL   BUN 11 6 - 20 mg/dL   Creatinine, Ser 0.79 0.44 - 1.00 mg/dL   Calcium 8.6 (L) 8.9 - 10.3 mg/dL   GFR calc non Af Amer >60 >60 mL/min   GFR calc Af Amer >60 >60 mL/min    Comment: (NOTE) The eGFR has been calculated using the CKD EPI equation. This calculation has not been validated in all clinical situations. eGFR's persistently <60 mL/min signify possible Chronic Kidney Disease.    Anion gap 7 5 - 15   Dg Chest Port 1 View  Result Date: 09/12/2016 CLINICAL DATA:  Respiratory failure and short of breath EXAM: PORTABLE CHEST 1 VIEW COMPARISON:  09/11/2016 FINDINGS: Endotracheal tube has been removed in the interval. Bibasilar airspace disease unchanged. No heart failure or edema. Negative for pleural effusion. IMPRESSION: Mild bibasilar atelectasis/ infiltrate unchanged. Endotracheal tube removed. Electronically Signed   By: Franchot Gallo M.D.   On: 09/12/2016 08:06   Dg Chest Port 1 View  Result Date: 09/11/2016 CLINICAL DATA:  Respiratory failure. EXAM: PORTABLE CHEST 1 VIEW COMPARISON:  09/09/2016. FINDINGS: Endotracheal tube noted with its tip 4 cm above the carina. Heart size normal. Low lung volumes with basilar atelectasis. A developing infiltrate left lung base cannot be excluded. No pleural effusion or pneumothorax. IMPRESSION: 1. Endotracheal tube tip noted 4 cm above the carina. 2. Low lung volumes with bibasilar atelectasis. A developing infiltrate the left lung base cannot be excluded. Electronically Signed   By: Marcello Moores  Register   On: 09/11/2016 07:20    Medical Problem List and Plan: 1.  Traumatic TBI /SDH/SAH as well as maxillofacial fractures secondary to syncopal event. 2.  DVT Prophylaxis/Anticoagulation: SCDs. Monitor for any  signs of DVT 3. Pain Management: Flexeril as needed for muscle spasms as prior to admission. 4. Seizure prophylaxis. Keppra 500 mg twice a day. EEG negative 5. Neuropsych: This patient is capable of making decisions on her own behalf. 6. Skin/Wound Care: Routine skin checks 7. Fluids/Electrolytes/Nutrition: Routine I&O with follow-up chemistries 8. CAD/NSTEMI status post stenting. Plan to resume aspirin in 1 week a 81 mg daily and  Plavix in 2 weeks. No chest pain or shortness of breath 9. Hypertension. Lisinopril 10 mg daily, Lopressor 12.5 mg twice a day. Monitor with increased mobility 10. Hypothyroidism. Synthroid. 11. Hyperlipidemia.[place]  Post Admission Physician Evaluation: 1. Functional deficits secondary  to Traumatic TBI /SDH/SAH. 2. Patient is admitted to receive collaborative, interdisciplinary care between the physiatrist, rehab nursing staff, and therapy team. 3. Patient's level of medical complexity and substantial therapy needs in context of that medical necessity cannot be provided at a lesser intensity of care such as a SNF. 4. Patient has experienced substantial functional loss from his/her baseline which was documented above under the "Functional History" and "Functional Status" headings.  Judging by the patient's diagnosis, physical exam, and functional history, the patient has potential for functional progress which will result in measurable gains while on inpatient rehab.  These gains will be of substantial and practical use upon discharge  in facilitating mobility and self-care at the household level. 5. Physiatrist will provide 24 hour management of medical needs as well as oversight of the therapy plan/treatment and provide guidance as appropriate regarding the interaction of the two. 6. 24 hour rehab nursing will assist with safety, skin/wound care, disease management, medication administration, pain management and patient education and help integrate therapy  concepts, techniques,education, etc. 7. PT will assess and treat for/with: Lower extremity strength, range of motion, stamina, balance, functional mobility, safety, adaptive techniques and equipment, woundcare, coping skills, pain control, brain injury education.   Goals are: Mod I. 8. OT will assess and treat for/with: ADL's, functional mobility, safety, upper extremity strength, adaptive techniques and equipment, wound mgt, ego support, and community reintegration.   Goals are: Mod I. Therapy may proceed with showering this patient. 9. SLP will assess and treat for: higher level cognition. Goals are Mod I. 10. Case Management and Social Worker will assess and treat for psychological issues and discharge planning. 11. Team conference will be held weekly to assess progress toward goals and to determine barriers to discharge. 12. Patient will receive at least 3 hours of therapy per day at least 5 days per week. 13. ELOS: 5-7 days.       14. Prognosis:  excellent and good  Delice Lesch, MD, Mellody Drown 09/13/2016

## 2016-09-13 NOTE — Care Management Important Message (Signed)
Important Message  Patient Details  Name: Kristin Shaffer MRN: [id number] Date of Birth: [birth year]   Medicare Important Message Given:  Yes    [name] 09/13/2016, 10:04 AM

## 2016-09-13 NOTE — Interval H&P Note (Signed)
Kristin Shaffer was admitted today to Inpatient Rehabilitation with the diagnosis of TBI/SDH/SAH with facial fractures.  The patient's history has been reviewed, patient examined, and there is no change in status.  Patient continues to be appropriate for intensive inpatient rehabilitation.  I have reviewed the patient's chart and labs.  Questions were answered to the patient's satisfaction. The PAPE has been reviewed and assessment remains appropriate.  Kristin Shaffer 09/13/2016, 9:13 PM

## 2016-09-13 NOTE — PMR Pre-admission (Signed)
PMR Admission Coordinator Pre-Admission Assessment  Patient: Kristin Shaffer is an 82 y.o., female MRN: [id number] DOB: 11/07/1931 Height: 5\' 6"  (167.6 cm) Weight: 81.2 kg (179 lb)             Insurance Information HMO:     PPO:      PCP:      IPA:      80/20:      OTHER:  PRIMARY: Medicare A & B      Policy#: [id number]      Subscriber: Self CM Name:       Phone#:      Fax#:  Pre-Cert#:       Employer: Retired Benefits:  Phone #:      Name:  Eff. Date: 11/27/97     Deduct: $1,316      Out of Pocket Max: None      Life Max: None CIR: 100%      SNF: 100% Outpatient: PT/OT/SLP 80%     Co-Pay: 20% Home Health: PT/OT/SLP 100%      Co-Pay: none DME: 80%      Co-Pay: 20%  Providers: patient's choice   SECONDARY: Generic Commercial       Policy#: [id number]      Subscriber: Self CM Name:       Phone#:      Fax#:  Pre-Cert#:       Employer: Retired  Benefits:  Phone #: [phone]     Name:  Eff. Date:      Deduct:       Out of Pocket Max:       Life Max:  CIR:       SNF:  Outpatient:      Co-Pay:  Home Health:       Co-Pay:  DME:      Co-Pay:   Medicaid Application Date:       Case Manager:  Disability Application Date:       Case Worker:   Emergency Contact Information Contact Information    Name Relation Home Work Mobile   Mandile,Bruce Son [phone]  [phone]   Richardson Dopp [phone]  [place]   [phone]   [place]   [phone]   Adrinne, Cuellar   [phone]   Kopke,Denise Other, daughter-in-law   [phone]     Current Medical History  Patient Admitting Diagnosis: TBI with skull fx's after fall  History of Present Illness: Kristin Shaffer an 82 y.o.right handed femalewith history of CAD status post NSTEMIwith stenting maintained on Plavix and aspirin, hypertension, pulmonary fibrosis.Per chart review patient lives alone in [place], [place]. Independent with occasional cane use for  the last month since her diagnosis of spinal stenosis and still driving.Presented 09/09/2016 after syncopal episode while working at [employer]. Denied any chest pain or shortness of breath. Cranial CT scan showed extensive subarachnoid hemorrhage throughout the right sylvian fissure and lateral left temporal lobe. Hemorrhagic contusion anterior right temporal tip and anterior frontal lobes right greater than left. Large right periorbital hematoma with extensive inflammatory changes. Comminuted anterior maxillary sinus fracture with blood throughout the right maxillary sinus. Minimally displaced frontal process maxillary fracture right nasal bone fracture. CT cervical spine with no acute abnormalities. Troponin negative. Neurosurgery, Dr. Gerrit Halls care. Advised to resume aspirin in 1 week and Plavix in 2 weeks. Otolaryngology, Dr. Wilburn Cornelia also advising conservative care of multiple facial fractures. Echocardiogram for workup of syncope showed ejection fraction of 70% no wall motion abnormalities.  EEG showed severe diffuse cerebral dysfunction no seizure activity. Maintained on Keppra for seizure prophylaxis. Tolerating a regular diet. Physical and occupational therapy evaluation completed 09/12/2016 with recommendations of physical medicine rehabilitation consult. Patient was admitted for a comprehensive rehabilitation program 09/13/16.      Past Medical History  Past Medical History:  Diagnosis Date  . Anxiety   . Arthritis    "different places; not bad" (03/20/2013)  . Coronary artery disease   . Exertional shortness of breath   . GERD (gastroesophageal reflux disease)   . High cholesterol   . Hypertension   . Hypothyroidism   . Myocardial infarction    "Dr's saw evidence I might have had a heart attack" (03/20/2013)  . NSTEMI (non-ST elevated myocardial infarction) ([place]) 03/20/2013   cath - mid RCA  . Pneumonia 2012  . Skin cancer    "both legs; right arm" (03/20/2013)     Family History  family history includes CAD (age of onset: 92) in her father; Cancer in her brother; Dementia in her mother; Lung cancer in her father.  Prior Rehab/Hospitalizations:  Has the patient had major surgery during 100 days prior to admission? No  Current Medications   Current Facility-Administered Medications:  .  acetaminophen (TYLENOL) tablet 650 mg, 650 mg, Oral, Q4H PRN, Greer Pickerel, MD, 650 mg at 09/11/16 0134 .  alum & mag hydroxide-simeth (MAALOX/MYLANTA) 200-200-20 MG/5ML suspension 30 mL, 30 mL, Oral, PRN, Rolm Bookbinder, MD, 30 mL at 09/10/16 2117 .  atorvastatin (LIPITOR) tablet 20 mg, 20 mg, Oral, Daily, Greer Pickerel, MD, 20 mg at 09/13/16 1007 .  bacitracin ointment, , Topical, BID, Jerrell Belfast, MD .  cyclobenzaprine [facility]) tablet 5 mg, 5 mg, Oral, BID PRN, Greer Pickerel, MD, 5 mg at 09/10/16 2055 .  docusate sodium (COLACE) capsule 100 mg, 100 mg, Oral, BID, Greer Pickerel, MD, 100 mg at 09/13/16 1008 .  ezetimibe (ZETIA) tablet 5 mg, 5 mg, Oral, Daily, Greer Pickerel, MD, 5 mg at 09/13/16 1006 .  levETIRAcetam (KEPPRA) tablet 500 mg, 500 mg, Oral, BID, Judeth Horn, MD, 500 mg at 09/13/16 1007 .  levothyroxine (SYNTHROID, LEVOTHROID) tablet 100 mcg, 100 mcg, Oral, QAC breakfast, Greer Pickerel, MD, 100 mcg at 09/13/16 0802 .  lisinopril (PRINIVIL,ZESTRIL) tablet 10 mg, 10 mg, Oral, Daily, Greer Pickerel, MD, 10 mg at 09/13/16 1007 .  LORazepam (ATIVAN) injection 1 mg, 1 mg, Intravenous, Once, Raylene Miyamoto, MD .  metoprolol tartrate (LOPRESSOR) tablet 12.5 mg, 12.5 mg, Oral, BID, Greer Pickerel, MD, 12.5 mg at 09/13/16 1006 .  morphine 2 MG/ML injection 1 mg, 1 mg, Intravenous, Q2H PRN, Greer Pickerel, MD .  nitroGLYCERIN (NITROSTAT) SL tablet 0.4 mg, 0.4 mg, Sublingual, Q5 min PRN, Greer Pickerel, MD .  ondansetron [facility]) tablet 4 mg, 4 mg, Oral, Q6H PRN **OR** ondansetron (ZOFRAN) injection 4 mg, 4 mg, Intravenous, Q6H PRN, Greer Pickerel, MD .  pantoprazole (PROTONIX)  EC tablet 40 mg, 40 mg, Oral, Daily, Greer Pickerel, MD, 40 mg at 09/13/16 1007  Patients Current Diet: Diet Heart Room service appropriate? Yes; Fluid consistency: Thin  Precautions / Restrictions Precautions Precautions: Fall Precaution Comments: syncope without warning Restrictions Weight Bearing Restrictions: No   Has the patient had 2 or more falls or a fall with injury in the past year?Yes  Prior Activity Level Community (5-7x/wk): (S) Prior to admission patient was Independent at home with basic and complex tasks, drove about 5 times a week to run errands, attend church & events, and  goes out to lunch with friends on Thursdays.  She enjoyes her independence and cooking.    Home Assistive Devices / Equipment Home Assistive Devices/Equipment: Kasandra Knudsen (specify quad or straight), Grab bars in shower, Grab bars around toilet, Eyeglasses, Blood pressure cuff (quad cane) Home Equipment: Cane - quad, Grab bars - tub/shower, Cane - single point  Prior Device Use: Indicate devices/aids used by the patient prior to current illness, exacerbation or injury? Quad cane intermittently for the last month since diagnosis of spinal stenosis   Prior Functional Level Prior Function Level of Independence: Independent with assistive device(s) Comments: typically no device, but occasionally quad cane due to Rt posterior hip/thigh pain (recent diagnosis spinal stenosis); difficulty rising from low surfaces (toilet)  Self Care: Did the patient need help bathing, dressing, using the toilet or eating?  Independent  Indoor Mobility: Did the patient need assistance with walking from room to room (with or without device)? Independent  Stairs: Did the patient need assistance with internal or external stairs (with or without device)? Independent  Functional Cognition: Did the patient need help planning regular tasks such as shopping or remembering to take medications? Independent  Current Functional  Level Cognition  Overall Cognitive Status: Within Functional Limits for tasks assessed Orientation Level: Oriented to person, Oriented to place, Disoriented to time    Extremity Assessment (includes Sensation/Coordination)  Upper Extremity Assessment: Generalized weakness  Lower Extremity Assessment: Defer to PT evaluation    ADLs  Overall ADL's : Needs assistance/impaired Grooming: Wash/dry face, Wash/dry hands, Oral care, Minimal assistance, Standing Upper Body Bathing: Minimal assitance Lower Body Bathing: Moderate assistance Toilet Transfer: Minimal assistance Toileting- Clothing Manipulation and Hygiene: Moderate assistance Toileting - Clothing Manipulation Details (indicate cue type and reason): pt with anterior LOB with peri care. Recommend RN staff stay directly beside patient with peri care. pt fall risk due to anterior reach General ADL Comments: Pt supine and requesting to transfer to bathroom on arrival. Pt with multiple voids while sitting and prolonged time to complete void. Pt with fall risk during peri care.     Mobility  Overal bed mobility: Needs Assistance Bed Mobility: Supine to Sit Supine to sit: Min assist General bed mobility comments: min assist to pull to sit, assist to rotate trunk increased time and effort to perform    Transfers  Overall transfer level: Needs assistance Equipment used: 1 person hand held assist Transfers: Sit to/from Stand Sit to Stand: Min assist General transfer comment: Min assist for stability to power up to standing    Ambulation / Gait / Stairs / Wheelchair Mobility  Ambulation/Gait Ambulation/Gait assistance: Min assist, Mod assist (2 episodes of MOD assist when running into wall) Ambulation Distance (Feet): 80 Feet Assistive device: Rolling walker (2 wheeled), 1 person hand held assist Gait Pattern/deviations: Step-through pattern, Decreased stride length, Shuffle, Drifts right/left General Gait Details: less antalgic gait  with RW (RW is slightly too tall for pt; with appropriate height anticipate even more improvement in gait) (poor ability to use RW) Gait velocity: patient continuously bumping into objects on left side, poor stability with RW Gait velocity interpretation: at or above normal speed for age/gender    Posture / Balance Balance Overall balance assessment: Needs assistance Sitting-balance support: No upper extremity supported Sitting balance-Leahy Scale: Good Standing balance support: No upper extremity supported Standing balance-Leahy Scale: Fair    Special needs/care consideration BiPAP/CPAP: No CPM: No Continuous Drip IV: No Dialysis: No         Life Vest: No  Oxygen: No Special Bed: No Trach Size: No Wound Vac (area): No       Skin: Bruising and abrasion                         Location: Right and left arm, eye, face, elbow, leg, knee, neck, hand, arm Bowel mgmt: Continent 09/12/16 Bladder mgmt: Continent  Diabetic mgmt: Prior to admission patient was told by her MD that recent labs were elevated and that she needed on limit carbs.     Previous Home Environment Living Arrangements: Alone Available Help at Discharge: Family, Available 24 hours/day Type of Home: House Home Layout: Two level, Able to live on main level with bedroom/bathroom Home Access: Stairs to enter Entrance Stairs-Rails: Left Entrance Stairs-Number of Steps: 2 Bathroom Shower/Tub: Chiropodist: Standard (counter beside) Home Care Services: No  Discharge Living Setting Plans for Discharge Living Setting: Patient's home, House, Alone Type of Home at Discharge: House Discharge Home Layout: Two level, Able to live on main level with bedroom/bathroom Alternate Level Stairs-Rails:  (yes) Alternate Level Stairs-Number of Steps: 12-13 Discharge Home Access: Stairs to enter Entrance Stairs-Rails: Right Entrance Stairs-Number of Steps: 2, platform, 1 Discharge Bathroom Shower/Tub: Tub/shower  unit, Curtain Discharge Bathroom Toilet: Standard Discharge Bathroom Accessibility: Yes How Accessible: Accessible via walker Does the patient have any problems obtaining your medications?: No  Social/Family/Support Systems Patient Roles: Parent Anticipated Caregiver: SonUriana Bueche [phone]; Daughter-in-law: Addysin Huard [phone] Ability/Limitations of Caregiver: [place] to help as needed Discharge Plan Discussed with Primary Caregiver: Yes Is Caregiver In Agreement with Plan?: Yes Does Caregiver/Family have Issues with Lodging/Transportation while Pt is in Rehab?: No  Goals/Additional Needs Patient/Family Goal for Rehab: PT/OT/SLP Mod I  Expected length of stay: 5-7 days  Cultural Considerations: Patient identified as a [employer]  Dietary Needs: Heart Healty and Carb. Mod. Equipment Needs: TBD Special Service Needs: None Additional Information: None Pt/Family Agrees to Admission and willing to participate: Yes Program Orientation Provided & Reviewed with Pt/Caregiver Including Roles  & Responsibilities: Yes Additional Information Needs: None Information Needs to be Provided By: N/A  Decrease burden of Care through IP rehab admission: No  Possible need for SNF placement upon discharge: No  Patient Condition: This patient's condition remains as documented in the consult dated 09/12/16, in which the Rehabilitation Physician determined and documented that the patient's condition is appropriate for intensive rehabilitative care in an inpatient rehabilitation facility. Will admit to inpatient rehab today.   Preadmission Screen Completed By:  Gunnar Fusi, 09/13/2016 12:23 PM ______________________________________________________________________   Discussed status with Dr. Posey Pronto on 09/13/16 at 1215 and received telephone approval for admission today.  Admission Coordinator:  Gunnar Fusi, time 1215/Date 09/13/16

## 2016-09-13 NOTE — Progress Notes (Signed)
Inpatient Rehabilitation  Received medical clearance to admit patient to IP Rehab today.  Updated team.  Plan to proceed with admission today.  Please call with questions.   Carmelia Roller., CCC/SLP Admission Coordinator  [place]  Cell [phone]

## 2016-09-13 NOTE — H&P (View-Only) (Signed)
  Physical Medicine and Rehabilitation Admission H&P    Chief Complaint  Patient presents with  . Fall  . Head Injury  . Facial Swelling  : HPI: Kristin Shaffer is a 82 y.o. right handed female with history of CAD status post NSTEMI with stenting maintained on Plavix and aspirin, hypertension, pulmonary fibrosis. Per chart review patient lives alone in [place]. Independent with occasional cane and still driving. Son and daughter in the area work. 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 Denied any chest pain or shortness of breath. Cranial CT scan showed extensive subarachnoid hemorrhage throughout the right sylvian fissure and lateral left temporal lobe. Hemorrhagic contusion anterior right temporal tip and anterior frontal lobes right greater than left. Large right periorbital hematoma with extensive inflammatory changes. Comminuted anterior maxillary sinus fracture with blood throughout the right maxillary sinus. Minimally displaced frontal process maxillary fracture right nasal bone fracture. CT cervical spine with no acute abnormalities. Troponin negative. Neurosurgery Dr. Ditty  advised conservative care. Advised to resume aspirin in 1 week and Plavix in 2 weeks. Auto laryngology Dr. Shoemaker also advising conservative care of multiple facial fractures. Echocardiogram for workup of syncope showed ejection fraction of 70% no wall motion abnormalities. EEG showed severe diffuse cerebral dysfunction no seizure activity. Maintained on Keppra for seizure prophylaxis. Tolerating a regular diet. Physical and occupational therapy evaluation completed 09/12/2016 with recommendations of physical medicine rehabilitation consult.Patient was admitted for a comprehensive rehabilitation program  ROS Constitutional: Negative for chills and fever.  HENT: Negative for hearing loss.   Eyes: Negative for blurred vision and double vision.  Respiratory:  Negative for cough.        Occasional exertional shortness of breath  Cardiovascular: Negative for chest pain.  Gastrointestinal: Positive for constipation. Negative for nausea and vomiting.       GERD  Genitourinary: Negative for dysuria and hematuria.  Musculoskeletal: Positive for joint pain.  Skin: Negative for rash.  Neurological: Positive for dizziness. Negative for headaches.  Psychiatric/Behavioral:       Anxiety  All other systems reviewed and are negative   Past Medical History:  Diagnosis Date  . Anxiety   . Arthritis    "different places; not bad" (03/20/2013)  . Coronary artery disease   . Exertional shortness of breath   . GERD (gastroesophageal reflux disease)   . High cholesterol   . Hypertension   . Hypothyroidism   . Myocardial infarction    "Dr's saw evidence I might have had a heart attack" (03/20/2013)  . NSTEMI (non-ST elevated myocardial infarction) (HCC) 03/20/2013   cath - mid RCA  . Pneumonia 2012  . Skin cancer    "both legs; right arm" (03/20/2013)   Past Surgical History:  Procedure Laterality Date  . APPENDECTOMY  2003  . BREAST BIOPSY Bilateral 1990s   "total of 5; 2 on one side, 3 on the other; all benign" (03/20/2013)  . CATARACT EXTRACTION W/ INTRAOCULAR LENS  IMPLANT, BILATERAL Bilateral ~ 2008  . CORONARY ANGIOPLASTY  12/08/2013  . CORONARY ANGIOPLASTY WITH STENT PLACEMENT  03/20/2013   NSTEMI - subtotal occlusion of mid RCA - PCI of mid RCA of long tubular 40-60% lesion - 2 tandem 95-99% subtotal occlusions - 3 overlapping Xience Xpedition DES (Dr. D. Harding)   . DILATION AND CURETTAGE OF UTERUS  1956?  . LEFT HEART CATHETERIZATION WITH CORONARY ANGIOGRAM N/A 03/20/2013   Procedure: LEFT HEART CATHETERIZATION WITH CORONARY ANGIOGRAM;  Surgeon: Mihai Croitoru,   MD;  Location: [facility] CATH [facility];  Service: Cardiovascular;  Laterality: N/A;  . LEFT HEART CATHETERIZATION WITH CORONARY ANGIOGRAM N/A 12/08/2013   Procedure: LEFT HEART CATHETERIZATION WITH  CORONARY ANGIOGRAM;  Surgeon: Michael D Cooper, MD;  Location: [facility] CATH [facility];  Service: Cardiovascular;  Laterality: N/A;  . SKIN CANCER EXCISION     "1 off right arm; 2 off each leg" (03/20/2013)  . TRANSTHORACIC ECHOCARDIOGRAM  03/2013   EF 60-65%, grade 1 diastolic dysfunction; mild MR; calcifed MV annulus; LA mildly dilated   Family History  Problem Relation Age of Onset  . Dementia Mother   . Lung cancer Father   . CAD Father 68  . Cancer Brother    Social History:  reports that she has never smoked. She has never used smokeless tobacco. She reports that she does not drink alcohol or use drugs. Allergies:  Allergies  Allergen Reactions  . Atorvastatin Other (See Comments)    Leg weakness and cramps  . Hydrocodone-Acetaminophen Nausea And Vomiting  . Omeprazole Other (See Comments)    Made acid reflux worse  . Zocor [Simvastatin] Other (See Comments)    Trouble Walking, Leg weakness and cramping  . Latex Hives and Rash   Medications Prior to Admission  Medication Sig Dispense Refill  . aspirin EC 81 MG tablet Take 81 mg by mouth daily.    . atorvastatin (LIPITOR) 20 MG tablet Take 1 tablet (20 mg total) by mouth daily. 90 tablet 3  . Ca Carbonate-Mag Hydroxide (ROLAIDS) 550-110 MG CHEW Chew 1 tablet by mouth daily as needed (heartburn).     . CALCIUM-VITAMIN D PO Take 1 tablet by mouth daily.    . clopidogrel (PLAVIX) 75 MG tablet TAKE 1 TABLET EVERY DAY 90 tablet 2  . cyclobenzaprine (FLEXERIL) 5 MG tablet Take 1 tablet (5 mg total) by mouth 2 (two) times daily as needed for muscle spasms. 20 tablet 0  . ezetimibe (ZETIA) 10 MG tablet Take 5 mg by mouth daily.    . fenofibrate 54 MG tablet Take 1 tablet (54 mg total) by mouth daily. 90 tablet 2  . ibuprofen (ADVIL,MOTRIN) 200 MG tablet Take 200 mg by mouth every 6 (six) hours as needed for moderate pain.    . levothyroxine (SYNTHROID, LEVOTHROID) 100 MCG tablet Take 100 mcg by mouth daily before breakfast.    . lisinopril  (PRINIVIL,ZESTRIL) 10 MG tablet TAKE 1 TABLET BY MOUTH EVERY DAY 90 tablet 2  . Magnesium Oxide 250 MG TABS Take 1 tablet by mouth daily.    . meloxicam (MOBIC) 7.5 MG tablet Take 1 tablet (7.5 mg total) by mouth daily. 30 tablet 0  . metoprolol tartrate (LOPRESSOR) 25 MG tablet TAKE 1/2 TABLET TWICE DAILY 90 tablet 0  . nitroGLYCERIN (NITROSTAT) 0.4 MG SL tablet Place 1 tablet (0.4 mg total) under the tongue every 5 (five) minutes as needed for chest pain. 30 tablet 2  . potassium chloride SA (K-DUR,KLOR-CON) 20 MEQ tablet Take 1 tablet (20 mEq total) by mouth daily. 30 tablet 10  . HYDROcodone-acetaminophen (NORCO/VICODIN) 5-325 MG tablet Take 1 tablet by mouth every 4 (four) hours as needed. (Patient not taking: Reported on 09/09/2016) 20 tablet 0  . methocarbamol (ROBAXIN) 500 MG tablet Take 1 tablet (500 mg total) by mouth 3 (three) times daily between meals as needed. (Patient not taking: Reported on 09/09/2016) 20 tablet 0    Home: Home Living Family/patient expects to be discharged to:: Private residence Living Arrangements: Alone Available Help at   Discharge: Family, Available 24 hours/day Type of Home: House Home Access: Stairs to enter Entrance Stairs-Number of Steps: 2 Entrance Stairs-Rails: Left Home Layout: Two level, Able to live on main level with bedroom/bathroom Bathroom Shower/Tub: Tub/shower unit Bathroom Toilet: Standard (counter beside) Home Equipment: Cane - quad, Grab bars - tub/shower, Cane - single point   Functional History: Prior Function Level of Independence: Independent with assistive device(s) Comments: typically no device, but occasionally quad cane due to Rt posterior hip/thigh pain (recent diagnosis spinal stenosis); difficulty rising from low surfaces (toilet)  Functional Status:  Mobility: Bed Mobility Overal bed mobility: Needs Assistance Bed Mobility: Supine to Sit Supine to sit: Min assist General bed mobility comments: min assist to pull to  sit, assist to rotate trunk increased time and effort to perform Transfers Overall transfer level: Needs assistance Equipment used: 1 person hand held assist Transfers: Sit to/from Stand Sit to Stand: Min assist General transfer comment: Min assist for stability to power up to standing Ambulation/Gait Ambulation/Gait assistance: Min assist, Mod assist (2 episodes of MOD assist when running into wall) Ambulation Distance (Feet): 80 Feet Assistive device: Rolling walker (2 wheeled), 1 person hand held assist Gait Pattern/deviations: Step-through pattern, Decreased stride length, Shuffle, Drifts right/left General Gait Details: less antalgic gait with RW (RW is slightly too tall for pt; with appropriate height anticipate even more improvement in gait) (poor ability to use RW) Gait velocity: patient continuously bumping into objects on left side, poor stability with RW Gait velocity interpretation: at or above normal speed for age/gender    ADL: ADL Overall ADL's : Needs assistance/impaired Grooming: Wash/dry face, Wash/dry hands, Oral care, Minimal assistance, Standing Upper Body Bathing: Minimal assitance Lower Body Bathing: Moderate assistance Toilet Transfer: Minimal assistance Toileting- Clothing Manipulation and Hygiene: Moderate assistance Toileting - Clothing Manipulation Details (indicate cue type and reason): pt with anterior LOB with peri care. Recommend RN staff stay directly beside patient with peri care. pt fall risk due to anterior reach General ADL Comments: Pt supine and requesting to transfer to bathroom on arrival. Pt with multiple voids while sitting and prolonged time to complete void. Pt with fall risk during peri care.   Cognition: Cognition Overall Cognitive Status: Within Functional Limits for tasks assessed Orientation Level: Oriented to person, Oriented to place, Disoriented to time Cognition Arousal/Alertness: Awake/alert Behavior During Therapy: WFL for  tasks assessed/performed Overall Cognitive Status: Within Functional Limits for tasks assessed  Physical Exam: Blood pressure (!) 155/62, pulse 88, temperature 98 F (36.7 C), temperature source Axillary, resp. rate 19, height 5' 6" (1.676 m), weight 81.2 kg (179 lb), SpO2 98 %. Physical Exam Constitutional: She is oriented to person, place, and time. NAD. HENT:  Multiple bruises and ecchymosis to the face  Eyes:  Pupils round and reactive to light. No drainage. Neck: Normal range of motion. Neck supple. No thyromegaly present.  Cardiovascular: Normal rate and regular rhythm.   Respiratory: Effort normal and breath sounds normal. No respiratory distress.  GI: Soft. Bowel sounds are normal. She exhibits no distension.  Neurological: She is alert and oriented to person, place, and time. No cranial nerve deficit. Coordination normal.  Follows commands.  Recalls events leading up to accident.  Strength: B/l UE 4/5  B/l LE: 4/5 HF, 4+/5 KE and 4+/5 ADF/PF.  DTRs symmetric. Psychiatric: She has a normal mood and affect. Her behavior is normal. Thought content normal   Results for orders placed or performed during the hospital encounter of 09/09/16 (from the past 48   hour(s))  CBC with Differential/Platelet     Status: None   Collection Time: 09/12/16  2:59 AM  Result Value Ref Range   WBC 8.1 4.0 - 10.5 K/uL   RBC 4.11 3.87 - 5.11 MIL/uL   Hemoglobin 12.3 12.0 - 15.0 g/dL   HCT 37.3 36.0 - 46.0 %   MCV 90.8 78.0 - 100.0 fL   MCH 29.9 26.0 - 34.0 pg   MCHC 33.0 30.0 - 36.0 g/dL   RDW 13.9 11.5 - 15.5 %   Platelets 208 150 - 400 K/uL   Neutrophils Relative % 71 %   Neutro Abs 5.7 1.7 - 7.7 K/uL   Lymphocytes Relative 15 %   Lymphs Abs 1.2 0.7 - 4.0 K/uL   Monocytes Relative 10 %   Monocytes Absolute 0.8 0.1 - 1.0 K/uL   Eosinophils Relative 4 %   Eosinophils Absolute 0.3 0.0 - 0.7 K/uL   Basophils Relative 0 %   Basophils Absolute 0.0 0.0 - 0.1 K/uL  Basic metabolic panel      Status: Abnormal   Collection Time: 09/12/16  2:59 AM  Result Value Ref Range   Sodium 141 135 - 145 mmol/L   Potassium 3.8 3.5 - 5.1 mmol/L   Chloride 109 101 - 111 mmol/L   CO2 25 22 - 32 mmol/L   Glucose, Bld 107 (H) 65 - 99 mg/dL   BUN 11 6 - 20 mg/dL   Creatinine, Ser 0.79 0.44 - 1.00 mg/dL   Calcium 8.6 (L) 8.9 - 10.3 mg/dL   GFR calc non Af Amer >60 >60 mL/min   GFR calc Af Amer >60 >60 mL/min    Comment: (NOTE) The eGFR has been calculated using the CKD EPI equation. This calculation has not been validated in all clinical situations. eGFR's persistently <60 mL/min signify possible Chronic Kidney Disease.    Anion gap 7 5 - 15   Dg Chest Port 1 View  Result Date: 09/12/2016 CLINICAL DATA:  Respiratory failure and short of breath EXAM: PORTABLE CHEST 1 VIEW COMPARISON:  09/11/2016 FINDINGS: Endotracheal tube has been removed in the interval. Bibasilar airspace disease unchanged. No heart failure or edema. Negative for pleural effusion. IMPRESSION: Mild bibasilar atelectasis/ infiltrate unchanged. Endotracheal tube removed. Electronically Signed   By: Charles  Clark M.D.   On: 09/12/2016 08:06   Dg Chest Port 1 View  Result Date: 09/11/2016 CLINICAL DATA:  Respiratory failure. EXAM: PORTABLE CHEST 1 VIEW COMPARISON:  09/09/2016. FINDINGS: Endotracheal tube noted with its tip 4 cm above the carina. Heart size normal. Low lung volumes with basilar atelectasis. A developing infiltrate left lung base cannot be excluded. No pleural effusion or pneumothorax. IMPRESSION: 1. Endotracheal tube tip noted 4 cm above the carina. 2. Low lung volumes with bibasilar atelectasis. A developing infiltrate the left lung base cannot be excluded. Electronically Signed   By: Thomas  Register   On: 09/11/2016 07:20    Medical Problem List and Plan: 1.  Traumatic TBI /SDH/SAH as well as maxillofacial fractures secondary to syncopal event. 2.  DVT Prophylaxis/Anticoagulation: SCDs. Monitor for any  signs of DVT 3. Pain Management: Flexeril as needed for muscle spasms as prior to admission. 4. Seizure prophylaxis. Keppra 500 mg twice a day. EEG negative 5. Neuropsych: This patient is capable of making decisions on her own behalf. 6. Skin/Wound Care: Routine skin checks 7. Fluids/Electrolytes/Nutrition: Routine I&O with follow-up chemistries 8. CAD/NSTEMI status post stenting. Plan to resume aspirin in 1 week a 81 mg daily and   Plavix in 2 weeks. No chest pain or shortness of breath 9. Hypertension. Lisinopril 10 mg daily, Lopressor 12.5 mg twice a day. Monitor with increased mobility 10. Hypothyroidism. Synthroid. 11. Hyperlipidemia.Zetia/Lipitor  Post Admission Physician Evaluation: 1. Functional deficits secondary  to Traumatic TBI /SDH/SAH. 2. Patient is admitted to receive collaborative, interdisciplinary care between the physiatrist, rehab nursing staff, and therapy team. 3. Patient's level of medical complexity and substantial therapy needs in context of that medical necessity cannot be provided at a lesser intensity of care such as a SNF. 4. Patient has experienced substantial functional loss from his/her baseline which was documented above under the "Functional History" and "Functional Status" headings.  Judging by the patient's diagnosis, physical exam, and functional history, the patient has potential for functional progress which will result in measurable gains while on inpatient rehab.  These gains will be of substantial and practical use upon discharge  in facilitating mobility and self-care at the household level. 5. Physiatrist will provide 24 hour management of medical needs as well as oversight of the therapy plan/treatment and provide guidance as appropriate regarding the interaction of the two. 6. 24 hour rehab nursing will assist with safety, skin/wound care, disease management, medication administration, pain management and patient education and help integrate therapy  concepts, techniques,education, etc. 7. PT will assess and treat for/with: Lower extremity strength, range of motion, stamina, balance, functional mobility, safety, adaptive techniques and equipment, woundcare, coping skills, pain control, brain injury education.   Goals are: Mod I. 8. OT will assess and treat for/with: ADL's, functional mobility, safety, upper extremity strength, adaptive techniques and equipment, wound mgt, ego support, and community reintegration.   Goals are: Mod I. Therapy may proceed with showering this patient. 9. SLP will assess and treat for: higher level cognition. Goals are Mod I. 10. Case Management and Social Worker will assess and treat for psychological issues and discharge planning. 11. Team conference will be held weekly to assess progress toward goals and to determine barriers to discharge. 12. Patient will receive at least 3 hours of therapy per day at least 5 days per week. 13. ELOS: 5-7 days.       14. Prognosis:  excellent and good  Kia Stavros, MD, FAAPMR 09/13/2016 

## 2016-09-13 NOTE — Progress Notes (Signed)
Kristin Shaffer Rehab Admission Coordinator Signed Physical Medicine and Rehabilitation  PMR Pre-admission Date of Service: 09/13/2016 11:57 AM  Related encounter: ED to Hosp-Admission (Current) from 09/09/2016 in [place]       [] Hide copied text PMR Admission Coordinator Pre-Admission Assessment  Patient: Kristin Shaffer is an 82 y.o., female MRN: [id number] DOB: Dec 03, 1930 Height: 5\' 6"  (167.6 cm) Weight: 81.2 kg (179 lb)                                                                                                                                      Insurance Information HMO:     PPO:      PCP:      IPA:      80/20:      OTHER:  PRIMARY: Medicare A & B      Policy#: [id number]      Subscriber: Self CM Name:       Phone#:      Fax#:  Pre-Cert#:       Employer: Retired Benefits:  Phone #:      Name:  Eff. Date: 11/27/97     Deduct: $1,316      Out of Pocket Max: None      Life Max: None CIR: 100%      SNF: 100% Outpatient: PT/OT/SLP 80%     Co-Pay: 20% Home Health: PT/OT/SLP 100%      Co-Pay: none DME: 80%      Co-Pay: 20%  Providers: patient's choice   SECONDARY: Generic Commercial       Policy#: [id number]      Subscriber: Self CM Name:       Phone#:      Fax#:  Pre-Cert#:       Employer: Retired  Benefits:  Phone #: [phone]     Name:  Eff. Date:      Deduct:       Out of Pocket Max:       Life Max:  CIR:       SNF:  Outpatient:      Co-Pay:  Home Health:       Co-Pay:  DME:      Co-Pay:   Medicaid Application Date:       Case Manager:  Disability Application Date:       Case Worker:   Emergency Contact Information        Contact Information    Name Relation Home Work Mobile   Klump,Bruce Son [phone]  [phone]   Richardson Dopp [phone]  [place]   [phone]   [place]   [phone]   Janessah, Shipper   [phone]   Feeser,Denise  Other, daughter-in-law   [phone]     Current Medical History  Patient Admitting Diagnosis: TBI with skull fx's after fall  History of Present Illness: Kristin Shaffer an  82 y.o.right handed femalewith history of CAD status post NSTEMIwith stenting maintained on Plavix and aspirin, hypertension, pulmonary fibrosis.Per chart review patient lives alone in [place], [place]. Independent with occasional cane use for the last month since her diagnosis of spinal stenosis and still driving.Presented 09/09/2016 after syncopal episode while working at [employer]. Denied any chest pain or shortness of breath. Cranial CT scan showed extensive subarachnoid hemorrhage throughout the right sylvian fissure and lateral left temporal lobe. Hemorrhagic contusion anterior right temporal tip and anterior frontal lobes right greater than left. Large right periorbital hematoma with extensive inflammatory changes. Comminuted anterior maxillary sinus fracture with blood throughout the right maxillary sinus. Minimally displaced frontal process maxillary fracture right nasal bone fracture. CT cervical spine with no acute abnormalities. Troponin negative. Neurosurgery, Dr. Gerrit Shaffer care. Advised to resume aspirin in 1 week and Plavix in 2 weeks. Otolaryngology, Dr. Wilburn Shaffer also advising conservative care of multiple facial fractures. Echocardiogram for workup of syncope showed ejection fraction of 70% no wall motion abnormalities. EEG showed severe diffuse cerebral dysfunction no seizure activity. Maintained on Keppra for seizure prophylaxis. Tolerating a regular diet. Physical and occupationaltherapy evaluation completed 09/12/2016 with recommendations of physical medicine rehabilitation consult. Patient was admitted for a comprehensive rehabilitation program 09/13/16.      Past Medical History      Past Medical History:  Diagnosis Date  . Anxiety   . Arthritis     "different places; not bad" (03/20/2013)  . Coronary artery disease   . Exertional shortness of breath   . GERD (gastroesophageal reflux disease)   . High cholesterol   . Hypertension   . Hypothyroidism   . Myocardial infarction    "Dr's saw evidence I might have had a heart attack" (03/20/2013)  . NSTEMI (non-ST elevated myocardial infarction) ([place]) 03/20/2013   cath - mid RCA  . Pneumonia 2012  . Skin cancer    "both legs; right arm" (03/20/2013)    Family History  family history includes CAD (age of onset: 68) in her father; Cancer in her brother; Dementia in her mother; Lung cancer in her father.  Prior Rehab/Hospitalizations:  Has the patient had major surgery during 100 days prior to admission? No  Current Medications   Current Facility-Administered Medications:  .  acetaminophen (TYLENOL) tablet 650 mg, 650 mg, Oral, Q4H PRN, Greer Pickerel, MD, 650 mg at 09/11/16 0134 .  alum & mag hydroxide-simeth (MAALOX/MYLANTA) 200-200-20 MG/5ML suspension 30 mL, 30 mL, Oral, PRN, Rolm Bookbinder, MD, 30 mL at 09/10/16 2117 .  atorvastatin (LIPITOR) tablet 20 mg, 20 mg, Oral, Daily, Greer Pickerel, MD, 20 mg at 09/13/16 1007 .  bacitracin ointment, , Topical, BID, Jerrell Belfast, MD .  cyclobenzaprine [facility]) tablet 5 mg, 5 mg, Oral, BID PRN, Greer Pickerel, MD, 5 mg at 09/10/16 2055 .  docusate sodium (COLACE) capsule 100 mg, 100 mg, Oral, BID, Greer Pickerel, MD, 100 mg at 09/13/16 1008 .  ezetimibe (ZETIA) tablet 5 mg, 5 mg, Oral, Daily, Greer Pickerel, MD, 5 mg at 09/13/16 1006 .  levETIRAcetam (KEPPRA) tablet 500 mg, 500 mg, Oral, BID, Judeth Horn, MD, 500 mg at 09/13/16 1007 .  levothyroxine (SYNTHROID, LEVOTHROID) tablet 100 mcg, 100 mcg, Oral, QAC breakfast, Greer Pickerel, MD, 100 mcg at 09/13/16 0802 .  lisinopril (PRINIVIL,ZESTRIL) tablet 10 mg, 10 mg, Oral, Daily, Greer Pickerel, MD, 10 mg at 09/13/16 1007 .  LORazepam (ATIVAN) injection 1 mg, 1 mg, Intravenous, Once, Raylene Miyamoto, MD .  metoprolol tartrate (LOPRESSOR)  tablet 12.5 mg, 12.5 mg, Oral, BID, Greer Pickerel, MD, 12.5 mg at 09/13/16 1006 .  morphine 2 MG/ML injection 1 mg, 1 mg, Intravenous, Q2H PRN, Greer Pickerel, MD .  nitroGLYCERIN (NITROSTAT) SL tablet 0.4 mg, 0.4 mg, Sublingual, Q5 min PRN, Greer Pickerel, MD .  ondansetron [facility]) tablet 4 mg, 4 mg, Oral, Q6H PRN **OR** ondansetron (ZOFRAN) injection 4 mg, 4 mg, Intravenous, Q6H PRN, Greer Pickerel, MD .  pantoprazole (PROTONIX) EC tablet 40 mg, 40 mg, Oral, Daily, Greer Pickerel, MD, 40 mg at 09/13/16 1007  Patients Current Diet: Diet Heart Room service appropriate? Yes; Fluid consistency: Thin  Precautions / Restrictions Precautions Precautions: Fall Precaution Comments: syncope without warning Restrictions Weight Bearing Restrictions: No   Has the patient had 2 or more falls or a fall with injury in the past year?Yes  Prior Activity Level Community (5-7x/wk): (S) Prior to admission patient was Independent at home with basic and complex tasks, drove about 5 times a week to run errands, attend church & events, and goes out to lunch with friends on Thursdays.  She enjoyes her independence and cooking.    Home Assistive Devices / Equipment Home Assistive Devices/Equipment: Kasandra Knudsen (specify quad or straight), Grab bars in shower, Grab bars around toilet, Eyeglasses, Blood pressure cuff (quad cane) Home Equipment: Cane - quad, Grab bars - tub/shower, Cane - single point  Prior Device Use: Indicate devices/aids used by the patient prior to current illness, exacerbation or injury? Quad cane intermittently for the last month since diagnosis of spinal stenosis   Prior Functional Level Prior Function Level of Independence: Independent with assistive device(s) Comments: typically no device, but occasionally quad cane due to Rt posterior hip/thigh pain (recent diagnosis spinal stenosis); difficulty rising from low surfaces (toilet)  Self Care: Did the  patient need help bathing, dressing, using the toilet or eating?  Independent  Indoor Mobility: Did the patient need assistance with walking from room to room (with or without device)? Independent  Stairs: Did the patient need assistance with internal or external stairs (with or without device)? Independent  Functional Cognition: Did the patient need help planning regular tasks such as shopping or remembering to take medications? Independent  Current Functional Level Cognition Overall Cognitive Status: Within Functional Limits for tasks assessed Orientation Level: Oriented to person, Oriented to place, Disoriented to time    Extremity Assessment (includes Sensation/Coordination) Upper Extremity Assessment: Generalized weakness  Lower Extremity Assessment: Defer to PT evaluation   ADLs Overall ADL's : Needs assistance/impaired Grooming: Wash/dry face, Wash/dry hands, Oral care, Minimal assistance, Standing Upper Body Bathing: Minimal assitance Lower Body Bathing: Moderate assistance Toilet Transfer: Minimal assistance Toileting- Clothing Manipulation and Hygiene: Moderate assistance Toileting - Clothing Manipulation Details (indicate cue type and reason): pt with anterior LOB with peri care. Recommend RN staff stay directly beside patient with peri care. pt fall risk due to anterior reach General ADL Comments: Pt supine and requesting to transfer to bathroom on arrival. Pt with multiple voids while sitting and prolonged time to complete void. Pt with fall risk during peri care.    Mobility Overal bed mobility: Needs Assistance Bed Mobility: Supine to Sit Supine to sit: Min assist General bed mobility comments: min assist to pull to sit, assist to rotate trunk increased time and effort to perform   Transfers Overall transfer level: Needs assistance Equipment used: 1 person hand held assist Transfers: Sit to/from Stand Sit to Stand: Min assist General transfer comment: Min assist for  stability to power up  to standing   Ambulation / Gait / Stairs / Wheelchair Mobility Ambulation/Gait Ambulation/Gait assistance: Min assist, Mod assist (2 episodes of MOD assist when running into wall) Ambulation Distance (Feet): 80 Feet Assistive device: Rolling walker (2 wheeled), 1 person hand held assist Gait Pattern/deviations: Step-through pattern, Decreased stride length, Shuffle, Drifts right/left General Gait Details: less antalgic gait with RW (RW is slightly too tall for pt; with appropriate height anticipate even more improvement in gait) (poor ability to use RW) Gait velocity: patient continuously bumping into objects on left side, poor stability with RW Gait velocity interpretation: at or above normal speed for age/gender   Posture / Balance Balance Overall balance assessment: Needs assistance Sitting-balance support: No upper extremity supported Sitting balance-Leahy Scale: Good Standing balance support: No upper extremity supported Standing balance-Leahy Scale: Fair   Special needs/care consideration BiPAP/CPAP: No CPM: No Continuous Drip IV: No Dialysis: No         Life Vest: No Oxygen: No Special Bed: No Trach Size: No Wound Vac (area): No       Skin: Bruising and abrasion                         Location: Right and left arm, eye, face, elbow, leg, knee, neck, hand, arm Bowel mgmt: Continent 09/12/16 Bladder mgmt: Continent  Diabetic mgmt: Prior to admission patient was told by her MD that recent labs were elevated and that she needed on limit carbs.    Previous Home Environment Living Arrangements: Alone Available Help at Discharge: Family, Available 24 hours/day Type of Home: House Home Layout: Two level, Able to live on main level with bedroom/bathroom Home Access: Stairs to enter Entrance Stairs-Rails: Left Entrance Stairs-Number of Steps: 2 Bathroom Shower/Tub: Chiropodist: Standard (counter beside) Home Care Services:  No  Discharge Living Setting Plans for Discharge Living Setting: Patient's home, House, Alone Type of Home at Discharge: House Discharge Home Layout: Two level, Able to live on main level with bedroom/bathroom Alternate Level Stairs-Rails:  (yes) Alternate Level Stairs-Number of Steps: 12-13 Discharge Home Access: Stairs to enter Entrance Stairs-Rails: Right Entrance Stairs-Number of Steps: 2, platform, 1 Discharge Bathroom Shower/Tub: Tub/shower unit, Curtain Discharge Bathroom Toilet: Standard Discharge Bathroom Accessibility: Yes How Accessible: Accessible via walker Does the patient have any problems obtaining your medications?: No  Social/Family/Support Systems Patient Roles: Parent Anticipated Caregiver: SonKoey Gethers [phone]; Daughter-in-law: Shatara Buttrum [phone] Ability/Limitations of Caregiver: [place] to help as needed Discharge Plan Discussed with Primary Caregiver: Yes Is Caregiver In Agreement with Plan?: Yes Does Caregiver/Family have Issues with Lodging/Transportation while Pt is in Rehab?: No  Goals/Additional Needs Patient/Family Goal for Rehab: PT/OT/SLP Mod I  Expected length of stay: 5-7 days  Cultural Considerations: Patient identified as a [employer]  Dietary Needs: Heart Healty and Carb. Mod. Equipment Needs: TBD Special Service Needs: None Additional Information: None Pt/Family Agrees to Admission and willing to participate: Yes Program Orientation Provided & Reviewed with Pt/Caregiver Including Roles  & Responsibilities: Yes Additional Information Needs: None Information Needs to be Provided By: N/A  Decrease burden of Care through IP rehab admission: No  Possible need for SNF placement upon discharge: No  Patient Condition: This patient's condition remains as documented in the consult dated 09/12/16, in which the Rehabilitation Physician determined and documented that the patient's  condition is appropriate for intensive rehabilitative care in an inpatient rehabilitation facility. Will admit to inpatient rehab  today.   Preadmission Screen Completed By:  Kristin Shaffer, 09/13/2016 12:23 PM ______________________________________________________________________   Discussed status with Dr. Posey Pronto on 09/13/16 at 1215 and received telephone approval for admission today.  Admission Coordinator:  Kristin Shaffer, time 1215/Date 09/13/16       Cosigned by: Ankit Lorie Phenix, MD at 09/13/2016 12:37 PM  Revision History

## 2016-09-13 NOTE — Progress Notes (Signed)
Inpatient Rehabilitation  Met with patient and family to discuss team's recommendation for IP Rehab.  Shared booklets and answered questions.  Patient agreeable, I have a bed to offer her today, and await acute MD clearance prior to admission.  Please call with questions.    Carmelia Roller., CCC/SLP Admission Coordinator  [place]  Cell [phone]

## 2016-09-13 NOTE — Progress Notes (Signed)
Kristin Staggers, MD Physician Signed Physical Medicine and Rehabilitation  Consult Note Date of Service: 09/12/2016 2:25 PM  Related encounter: ED to Hosp-Admission (Current) from 09/09/2016 in [place] All Collapse All   [] Hide copied text [] Hover for attribution information      Physical Medicine and Rehabilitation Consult Reason for Consult: Traumatic SDH/SAH as well as maxillofacial fractures Referring Physician: Trauma   HPI: Kristin Shaffer is a 82 y.o. right handed female with history of CAD status post NSTEMI with stenting maintained on Plavix and aspirin, hypertension, pulmonary fibrosis. Per chart review patient lives alone in [place]. Independent with occasional cane and still driving. Son and daughter in the area work. Presented 09/09/2016 after syncopal episode while working at [employer]. Denied any chest pain or shortness of breath. Cranial CT scan showed extensive subarachnoid hemorrhage throughout the right sylvian fissure and lateral left temporal lobe. Hemorrhagic contusion anterior right temporal tip and anterior frontal lobes right greater than left. Large right periorbital hematoma with extensive inflammatory changes. Comminuted anterior maxillary sinus fracture with blood throughout the right maxillary sinus. Minimally displaced frontal process maxillary fracture right nasal bone fracture. CT cervical spine with no acute abnormalities. Troponin negative. Neurosurgery Dr. Cyndy Freeze  advised conservative care. Advised to resume aspirin in 1 week and Plavix in 2 weeks. Auto laryngology Dr. Wilburn Cornelia also advising conservative care of multiple facial fractures. Echocardiogram for workup of syncope showed ejection fraction of 70% no wall motion abnormalities. EEG showed severe diffuse cerebral dysfunction no seizure activity. Maintained on Keppra for seizure prophylaxis. Tolerating a regular diet.  Physical therapy evaluation completed 09/12/2016 with recommendations of physical medicine rehabilitation consult.   Review of Systems  Constitutional: Negative for chills and fever.  HENT: Negative for hearing loss.   Eyes: Negative for blurred vision and double vision.  Respiratory: Negative for cough.        Occasional exertional shortness of breath  Cardiovascular: Positive for palpitations and leg swelling. Negative for chest pain.  Gastrointestinal: Positive for constipation. Negative for nausea and vomiting.       GERD  Genitourinary: Negative for dysuria and hematuria.  Musculoskeletal: Positive for joint pain and myalgias.  Skin: Negative for rash.  Neurological: Positive for dizziness. Negative for headaches.  Psychiatric/Behavioral:       Anxiety  All other systems reviewed and are negative.      Past Medical History:  Diagnosis Date  . Anxiety   . Arthritis    "different places; not bad" (03/20/2013)  . Coronary artery disease   . Exertional shortness of breath   . GERD (gastroesophageal reflux disease)   . High cholesterol   . Hypertension   . Hypothyroidism   . Myocardial infarction    "Dr's saw evidence I might have had a heart attack" (03/20/2013)  . NSTEMI (non-ST elevated myocardial infarction) ([place]) 03/20/2013   cath - mid RCA  . Pneumonia 2012  . Skin cancer    "both legs; right arm" (03/20/2013)        Past Surgical History:  Procedure Laterality Date  . APPENDECTOMY  2003  . BREAST BIOPSY Bilateral 1990s   "total of 5; 2 on one side, 3 on the other; all benign" (03/20/2013)  . CATARACT EXTRACTION W/ INTRAOCULAR LENS  IMPLANT, BILATERAL Bilateral ~ 2008  . CORONARY ANGIOPLASTY  12/08/2013  . CORONARY ANGIOPLASTY WITH STENT PLACEMENT  03/20/2013   NSTEMI - subtotal occlusion of  mid RCA - PCI of mid RCA of long tubular 40-60% lesion - 2 tandem 95-99% subtotal occlusions - 3 overlapping Xience Xpedition DES (Dr. Roni Bread)   .  [place] OF UTERUS  1956?  Marland Kitchen LEFT HEART CATHETERIZATION WITH CORONARY ANGIOGRAM N/A 03/20/2013   Procedure: LEFT HEART CATHETERIZATION WITH CORONARY ANGIOGRAM;  Surgeon: Sanda Klein, MD;  Location: [facility] CATH LAB;  Service: Cardiovascular;  Laterality: N/A;  . LEFT HEART CATHETERIZATION WITH CORONARY ANGIOGRAM N/A 12/08/2013   Procedure: LEFT HEART CATHETERIZATION WITH CORONARY ANGIOGRAM;  Surgeon: Blane Ohara, MD;  Location: [facility] CATH LAB;  Service: Cardiovascular;  Laterality: N/A;  . SKIN CANCER EXCISION     "1 off right arm; 2 off each leg" (03/20/2013)  . TRANSTHORACIC ECHOCARDIOGRAM  03/2013   EF [id number], grade 1 diastolic dysfunction; mild MR; calcifed MV annulus; LA mildly dilated        Family History  Problem Relation Age of Onset  . Dementia Mother   . Lung cancer Father   . CAD Father 6  . Cancer Brother    Social History:  reports that she has never smoked. She has never used smokeless tobacco. She reports that she does not drink alcohol or use drugs. Allergies:       Allergies  Allergen Reactions  . Atorvastatin Other (See Comments)    Leg weakness and cramps  . Hydrocodone-Acetaminophen Nausea And Vomiting  . Omeprazole Other (See Comments)    Made acid reflux worse  . Zocor [Simvastatin] Other (See Comments)    Trouble Walking, Leg weakness and cramping  . Latex Hives and Rash         Medications Prior to Admission  Medication Sig Dispense Refill  . aspirin EC 81 MG tablet Take 81 mg by mouth daily.    Marland Kitchen atorvastatin (LIPITOR) 20 MG tablet Take 1 tablet (20 mg total) by mouth daily. 90 tablet 3  . Ca Carbonate-Mag Hydroxide (ROLAIDS) 550-110 MG CHEW Chew 1 tablet by mouth daily as needed (heartburn).     . CALCIUM-VITAMIN D PO Take 1 tablet by mouth daily.    . clopidogrel (PLAVIX) 75 MG tablet TAKE 1 TABLET EVERY DAY 90 tablet 2  . cyclobenzaprine (FLEXERIL) 5 MG tablet Take 1 tablet (5 mg total) by mouth 2 (two) times  daily as needed for muscle spasms. 20 tablet 0  . ezetimibe (ZETIA) 10 MG tablet Take 5 mg by mouth daily.    . fenofibrate 54 MG tablet Take 1 tablet (54 mg total) by mouth daily. 90 tablet 2  . ibuprofen (ADVIL,MOTRIN) 200 MG tablet Take 200 mg by mouth every 6 (six) hours as needed for moderate pain.    Marland Kitchen levothyroxine (SYNTHROID, LEVOTHROID) 100 MCG tablet Take 100 mcg by mouth daily before breakfast.    . lisinopril (PRINIVIL,ZESTRIL) 10 MG tablet TAKE 1 TABLET BY MOUTH EVERY DAY 90 tablet 2  . Magnesium Oxide 250 MG TABS Take 1 tablet by mouth daily.    . meloxicam (MOBIC) 7.5 MG tablet Take 1 tablet (7.5 mg total) by mouth daily. 30 tablet 0  . metoprolol tartrate (LOPRESSOR) 25 MG tablet TAKE 1/2 TABLET TWICE DAILY 90 tablet 0  . nitroGLYCERIN (NITROSTAT) 0.4 MG SL tablet Place 1 tablet (0.4 mg total) under the tongue every 5 (five) minutes as needed for chest pain. 30 tablet 2  . potassium chloride SA (K-DUR,KLOR-CON) 20 MEQ tablet Take 1 tablet (20 mEq total) by mouth daily. 30 tablet 10  . HYDROcodone-acetaminophen (  NORCO/VICODIN) 5-325 MG tablet Take 1 tablet by mouth every 4 (four) hours as needed. (Patient not taking: Reported on 09/09/2016) 20 tablet 0  . methocarbamol (ROBAXIN) 500 MG tablet Take 1 tablet (500 mg total) by mouth 3 (three) times daily between meals as needed. (Patient not taking: Reported on 09/09/2016) 20 tablet 0    Home: Home Living Family/patient expects to be discharged to:: Private residence Living Arrangements: Alone Available Help at Discharge: Family, Available 24 hours/day (son and his wife in from out of town) Type of Home: House Home Access: Stairs to enter Technical brewer of Steps: 2 Entrance Stairs-Rails: Left Home Layout: Two level, Able to live on main level with bedroom/bathroom Bathroom Shower/Tub: Chiropodist: Standard (counter beside) Home Equipment: Cane - quad, Grab bars - tub/shower, [employer] - single  point  Functional History: Prior Function Level of Independence: Independent with assistive device(s) Comments: typically no device, but occasionally quad cane due to Rt posterior hip/thigh pain (recent diagnosis spinal stenosis); difficulty rising from low surfaces (toilet) Functional Status:  Mobility: Bed Mobility Overal bed mobility: Needs Assistance Bed Mobility: Supine to Sit Supine to sit: Min assist General bed mobility comments: min assist to pull to sit, assist to rotate trunk increased time and effort to perform Transfers Overall transfer level: Needs assistance Equipment used: 1 person hand held assist Transfers: Sit to/from Stand Sit to Stand: Min assist General transfer comment: Min assist for stability to power up to standing Ambulation/Gait Ambulation/Gait assistance: Min assist, Mod assist (2 episodes of MOD assist when running into wall) Ambulation Distance (Feet): 80 Feet Assistive device: Rolling walker (2 wheeled), 1 person hand held assist Gait Pattern/deviations: Step-through pattern, Decreased stride length, Shuffle, Drifts right/left General Gait Details: less antalgic gait with RW (RW is slightly too tall for pt; with appropriate height anticipate even more improvement in gait) (poor ability to use RW) Gait velocity: patient continuously bumping into objects on left side, poor stability with RW Gait velocity interpretation: at or above normal speed for age/gender    ADL:    Cognition: Cognition Overall Cognitive Status: Within Functional Limits for tasks assessed Orientation Level: Oriented X4 Cognition Arousal/Alertness: Awake/alert Behavior During Therapy: WFL for tasks assessed/performed Overall Cognitive Status: Within Functional Limits for tasks assessed  Blood pressure (!) 130/43, pulse 99, temperature 97.9 F (36.6 C), temperature source Oral, resp. rate 17, height 5\' 6"  (1.676 m), weight 81.2 kg (179 lb), SpO2 95 %. Physical Exam    Constitutional: She is oriented to person, place, and time.  HENT:  Multiple bruises and ecchymosis to the face  Eyes:  Pupils round and reactive to light  Neck: Normal range of motion. Neck supple. No thyromegaly present.  Cardiovascular: Normal rate and regular rhythm.   Respiratory: Effort normal and breath sounds normal. No respiratory distress.  GI: Soft. Bowel sounds are normal. She exhibits no distension.  Neurological: She is alert and oriented to person, place, and time. No cranial nerve deficit. Coordination normal.  Follows commands. Oriented to person. Recalls events leading up to accident. Could recall the month and year. Strength 4/5 in the UE's. LE: 3HF, 4KE and 4+ADF/PF. No sensory findings.   Psychiatric: She has a normal mood and affect. Her behavior is normal. Thought content normal.    Lab Results Last 24 Hours       Results for orders placed or performed during the hospital encounter of 09/09/16 (from the past 24 hour(s))  CBC with Differential/Platelet     Status:  None   Collection Time: 09/12/16  2:59 AM  Result Value Ref Range   WBC 8.1 4.0 - 10.5 K/uL   RBC 4.11 3.87 - 5.11 MIL/uL   Hemoglobin 12.3 12.0 - 15.0 g/dL   HCT 37.3 36.0 - 46.0 %   MCV 90.8 78.0 - 100.0 fL   MCH 29.9 26.0 - 34.0 pg   MCHC 33.0 30.0 - 36.0 g/dL   RDW 13.9 11.5 - 15.5 %   Platelets 208 150 - 400 K/uL   Neutrophils Relative % 71 %   Neutro Abs 5.7 1.7 - 7.7 K/uL   Lymphocytes Relative 15 %   Lymphs Abs 1.2 0.7 - 4.0 K/uL   Monocytes Relative 10 %   Monocytes Absolute 0.8 0.1 - 1.0 K/uL   Eosinophils Relative 4 %   Eosinophils Absolute 0.3 0.0 - 0.7 K/uL   Basophils Relative 0 %   Basophils Absolute 0.0 0.0 - 0.1 K/uL  Basic metabolic panel     Status: Abnormal   Collection Time: 09/12/16  2:59 AM  Result Value Ref Range   Sodium 141 135 - 145 mmol/L   Potassium 3.8 3.5 - 5.1 mmol/L   Chloride 109 101 - 111 mmol/L   CO2 25 22 - 32 mmol/L    Glucose, Bld 107 (H) 65 - 99 mg/dL   BUN 11 6 - 20 mg/dL   Creatinine, Ser 0.79 0.44 - 1.00 mg/dL   Calcium 8.6 (L) 8.9 - 10.3 mg/dL   GFR calc non Af Amer >60 >60 mL/min   GFR calc Af Amer >60 >60 mL/min   Anion gap 7 5 - 15      Imaging Results (Last 48 hours)  Ct Head Wo Contrast  Result Date: 09/11/2016 CLINICAL DATA:  82 y/o F; intracranial hemorrhage with seizure requiring intubation. EXAM: CT HEAD WITHOUT CONTRAST TECHNIQUE: Contiguous axial images were obtained from the base of the skull through the vertex without intravenous contrast. COMPARISON:  09/10/2016 CT of the head. FINDINGS: Brain: Stable right frontal hemorrhagic contusion with mild associated edema and local mass effect. Stable subdural hematoma overlying the left cerebral convexity and along the falx. Stable diffuse subarachnoid hemorrhage and trace intraventricular hemorrhage. No new intracranial hemorrhage is identified. No midline shift or herniation. Ventricle size is stable. Vascular: Extensive calcific atherosclerosis of carotid siphons. Skull: Decreased swelling of the right frontal and periorbital scalp and stable hematoma overlying the left superolateral orbital rim. Sinuses/Orbits: Stable right maxillary sinus fractures and right nasal bone fracture with interval partial dispersion of hemorrhage into the right maxillary sinus. Paranasal sinuses are otherwise normally aerated. Bilateral intra-ocular lens replacement. No displaced orbital fracture or traumatic process of the orbit. Other: Partially visualized endotracheal tube. IMPRESSION: 1. Stable right frontal hemorrhagic contusion, edema, and local mass effect. 2. Stable diffuse subarachnoid hemorrhage. 3. Stable subdural hematoma over the left frontal parietal convexity and along the falx. 4. Stable trace intraventricular hemorrhage. 5. No new intracranial hemorrhage, midline shift, or herniation. 6. Decreased swelling in the right frontal scalp and periorbital  soft tissues. Stable right maxillary and right nasal bone fractures and hemorrhage in the right maxillary sinus. Electronically Signed   By: Kristine Garbe M.D.   On: 09/11/2016 06:37   Dg Chest Port 1 View  Result Date: 09/12/2016 CLINICAL DATA:  Respiratory failure and short of breath EXAM: PORTABLE CHEST 1 VIEW COMPARISON:  09/11/2016 FINDINGS: Endotracheal tube has been removed in the interval. Bibasilar airspace disease unchanged. No heart failure or edema. Negative for  pleural effusion. IMPRESSION: Mild bibasilar atelectasis/ infiltrate unchanged. Endotracheal tube removed. Electronically Signed   By: Franchot Gallo M.D.   On: 09/12/2016 08:06   Dg Chest Port 1 View  Result Date: 09/11/2016 CLINICAL DATA:  Respiratory failure. EXAM: PORTABLE CHEST 1 VIEW COMPARISON:  09/09/2016. FINDINGS: Endotracheal tube noted with its tip 4 cm above the carina. Heart size normal. Low lung volumes with basilar atelectasis. A developing infiltrate left lung base cannot be excluded. No pleural effusion or pneumothorax. IMPRESSION: 1. Endotracheal tube tip noted 4 cm above the carina. 2. Low lung volumes with bibasilar atelectasis. A developing infiltrate the left lung base cannot be excluded. Electronically Signed   By: Marcello Moores  Register   On: 09/11/2016 07:20     Assessment/Plan: Diagnosis: TBI with skull fx's after fall 1. Does the need for close, 24 hr/day medical supervision in concert with the patient's rehab needs make it unreasonable for this patient to be served in a less intensive setting? Yes 2. Co-Morbidities requiring supervision/potential complications: GERD, HTN, PF,  3. Due to bladder management, bowel management, safety, skin/wound care, disease management, medication administration, pain management and patient education, does the patient require 24 hr/day rehab nursing? Yes 4. Does the patient require coordinated care of a physician, rehab nurse, PT (1-2 hrs/day, 5 days/week),  OT (1-2 hrs/day, 5 days/week) and SLP (1-2 hrs/day, 5 days/week) to address physical and functional deficits in the context of the above medical diagnosis(es)? Yes Addressing deficits in the following areas: balance, endurance, locomotion, strength, transferring, bowel/bladder control, bathing, dressing, feeding, grooming, toileting, cognition and psychosocial support 5. Can the patient actively participate in an intensive therapy program of at least 3 hrs of therapy per day at least 5 days per week? Yes 6. The potential for patient to make measurable gains while on inpatient rehab is excellent 7. Anticipated functional outcomes upon discharge from inpatient rehab are modified independent  with PT, modified independent with OT, modified independent with SLP. 8. Estimated rehab length of stay to reach the above functional goals is: 7-10 days 9. Does the patient have adequate social supports and living environment to accommodate these discharge functional goals? Yes 10. Anticipated D/C setting: Home 11. Anticipated post D/C treatments: HH therapy and Outpatient therapy 12. Overall Rehab/Functional Prognosis: excellent  RECOMMENDATIONS: This patient's condition is appropriate for continued rehabilitative care in the following setting: CIR Patient has agreed to participate in recommended program. Yes Note that insurance prior authorization may be required for reimbursement for recommended care.  Comment: Rehab Admissions Coordinator to follow up.  Thanks,  Kristin Staggers, MD, [facility]     09/12/2016    Revision History                        Routing History

## 2016-09-13 NOTE — Discharge Summary (Signed)
Physician Discharge Summary  Patient ID: Kristin Shaffer MRN: [id number] DOB/AGE: 1931/04/05 82 y.o.  Admit date: 09/09/2016 Discharge date: 09/13/2016  Discharge Diagnoses Patient Active Problem List   Diagnosis Date Noted  . Fall 09/13/2016  . Facial fracture ([place]) 09/09/2016  . TBI (traumatic brain injury) ([place]) 09/09/2016  . Pulmonary fibrosis ([place]) 05/22/2016  . DOE (dyspnea on exertion) 04/14/2016  . Right flank pain 04/14/2016  . Pulmonary hypertension 04/14/2016  . Muscle spasm of back 09/15/2015  . Myalgia 03/23/2015  . Intermediate coronary syndrome ([place]) 12/07/2013  . Hyperlipidemia 12/03/2013  . Presence of drug coated stent in right coronary artery 03/22/2013  . NSTEMI (non-ST elevated myocardial infarction) ([place]) 03/21/2013  . Unstable angina 03/20/2013  . Hypertensive urgency -Accelerated Hypertension 03/20/2013  . GERD (gastroesophageal reflux disease) 03/20/2013  . Hypothyroidism 03/20/2013    Consultants Dr. Marland Kitchen Ditty for neurosurgery  Dr. Jerrell Belfast for ENT  Dr. 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 She did not remember the event. The next thing she remembered was waking up on the ambulance. She fell and landed on her right face. She stated that she had been feeling well the past couple of days. She reported compliance with her medications. Her workup in the emergency department showed extensive right periorbital facial swelling and contusion with subarachnoid hemorrhage, subdural hematoma and intraparenchymal hemorrhage as well as facial fractures. She was admitted to the trauma service and neurosurgery, cardiology, and ENT were consulted.   Hospital Course: Neurosurgery and ENT both recommended non-operative treatment for her brain injury and facial fractures. Cardiology began her workup for her syncope. A repeat head CT the next day showed  expected evolution of her brain injury. She remained neurologically intact. The night of hospital day #2 she had a seizure and critical care medicine intubated the patient. A repeat head CT was stable. An EEG was unrevealing. She was extubated the next day after being started on Keppra and had no further seizure activity. The traumatic brain injury therapy team evaluated the patient and recommended inpatient rehabilitation. They were consulted and agreed with admission. No cause for her syncope was identified and cardiology was planning on implanting a loop recorder after discharge. She was discharged in good condition.   Medications Scheduled Meds: . atorvastatin  20 mg Oral Daily  . bacitracin   Topical BID  . docusate sodium  100 mg Oral BID  . ezetimibe  5 mg Oral Daily  . levETIRAcetam  500 mg Oral BID  . levothyroxine  100 mcg Oral QAC breakfast  . lisinopril  10 mg Oral Daily  . LORazepam  1 mg Intravenous Once  . metoprolol tartrate  12.5 mg Oral BID  . pantoprazole  40 mg Oral Daily   Continuous Infusions:  PRN Meds:.acetaminophen, alum & mag hydroxide-simeth, cyclobenzaprine, morphine injection, nitroGLYCERIN, ondansetron **OR** ondansetron (ZOFRAN) IV   Follow-up Information    SHOEMAKER, DAVID, MD. Schedule an appointment as soon as possible for a visit in 10 day(s).   Specialty:  Otolaryngology Contact information: [address] [place] [address] [phone]        Kevan Ny Ditty, MD Follow up in 4 week(s).   Specialty:  Neurosurgery Contact information: [address] [place] [address] [place] .   Why:  Call as needed  Contact information: [address] [id number] [place] [phone]           Signed: Lisette Abu, PA-C Pager: [phone] General Trauma PA Pager: [phone] 09/13/2016, 12:06 PM

## 2016-09-13 NOTE — Care Management Note (Signed)
Case Management Note  Patient Details  Name: Kristin Shaffer MRN: [id number] Date of Birth: 24-Jun-1931  Subjective/Objective:                    Action/Plan: Pt discharging to CIR today. No further needs per CM.   Expected Discharge Date:                  Expected Discharge Plan:  [place]  In-House Referral:     Discharge planning Services  CM Consult  Post Acute Care Choice:    Choice offered to:     DME Arranged:    DME Agency:     HH Arranged:    [place] Agency:     Status of Service:  Completed, signed off  If discussed at [employer] of Stay Meetings, dates discussed:    Additional Comments:  Pollie Friar, RN 09/13/2016, 11:45 AM

## 2016-09-13 NOTE — Progress Notes (Signed)
S: No acute change. She wants to get out of the hospital.   Vitals, labs, intake/output, and orders reviewed at this time.  Gen: A&Ox3, no distress  H&N: EOMI, evolving facial ecchymoses, neck supple Chest: unlabored respirations, RRR Abd: soft, nontender, nondistended Ext: warm, no edema Neuro: grossly normal  Lines/tubes/drains: PIV  A/P:  82yo woman s/p syncope/fall with TBI (SDH, SAH, IPH), facial fx/hematomas, and significant comorbitities of CAD s/p PTCA/DES on Plavix, HTN, pulm HTN/fibrosis.  -Appreciate cardiology assistance -From standpoint of trauma she is cleared for rehab [place], MD [facility], [place] Pager [phone]

## 2016-09-13 NOTE — Progress Notes (Signed)
Physical Therapy Treatment Patient Details Name: Kristin Shaffer MRN: [id number] DOB: Oct 02, 1931 Today's Date: 09/13/2016    History of Present Illness 82 y.o. female with a PMH significant for CAD s/p NSTEMI with DESx2 to the RCA with later ISR, HTN, dyslipidemia, PHTN, and pulmonary fibrosis who is being admitted to the ICU following a syncopal episode resulting in Lt temporal SDH/SAH and Rt maxillofacial fractures. She had no prodromal signs prior to syncope. Cardiology feels syncope due to arrhythmia.. Re evaluation s/p seizure and intubation/extubation.    PT Comments    Gait stability improving, but still not steady with RW ( which she does not use optimally) and unilateral device.  Will benefit from more complex and intense tasks in rehab.  Follow Up Recommendations  CIR     Equipment Recommendations   (TBA in rehab)    Recommendations for Other Services       Precautions / Restrictions Precautions Precautions: Fall    Mobility  Bed Mobility               General bed mobility comments: up in the chair  Transfers Overall transfer level: Needs assistance   Transfers: Sit to/from Stand Sit to Stand: Min assist         General transfer comment: stability assist only  Ambulation/Gait Ambulation/Gait assistance: Min guard;Min assist Ambulation Distance (Feet): 150 Feet (x2) Assistive device: Rolling walker (2 wheeled);1 person hand held assist (rail) Gait Pattern/deviations: Step-through pattern     General Gait Details: mildly uncoordinated R LE vs L LE.  Some drift R and brushing into obstacles on the R with use of RW.  Use of R rail (no cane available) resulted in mild unsteadiness, more guarding, but no overt deviation.   Stairs            Wheelchair Mobility    Modified Rankin (Stroke Patients Only) Modified Rankin (Stroke Patients Only) Pre-Morbid Rankin Score: No symptoms Modified Rankin: Moderate disability     Balance Overall  balance assessment: Needs assistance Sitting-balance support: No upper extremity supported Sitting balance-Leahy Scale: Good     Standing balance support: No upper extremity supported Standing balance-Leahy Scale: Fair                      Cognition Arousal/Alertness: Awake/alert Behavior During Therapy: WFL for tasks assessed/performed Overall Cognitive Status: Within Functional Limits for tasks assessed                      Exercises General Exercises - Lower Extremity Hip Flexion/Marching: AROM;Strengthening;Both;10 reps;Seated;Other (comment) (resisted flexion/ext) Toe Raises: AROM;Both;15 reps;Seated Heel Raises: AROM;Both;15 reps;Seated Other Exercises Other Exercises: bicep/tricep presses bil x10 reps.    General Comments        Pertinent Vitals/Pain Pain Assessment: Faces Faces Pain Scale: Hurts a little bit Pain Location: R leg/hip Pain Descriptors / Indicators: Sore Pain Intervention(s): Monitored during session;Repositioned    Home Living                      Prior Function            PT Goals (current goals can now be found in the care plan section) Acute Rehab PT Goals Patient Stated Goal: return home tomorrow PT Goal Formulation: With patient Time For Goal Achievement: 09/17/16 Potential to Achieve Goals: Good Progress towards PT goals: Progressing toward goals    Frequency    Min 3X/week      PT  Plan Current plan remains appropriate    Co-evaluation             End of Session   Activity Tolerance: Patient tolerated treatment well Patient left: in chair;with call bell/phone within reach     Time: [id number] PT Time Calculation (min) (ACUTE ONLY): 24 min  Charges:  $Gait Training: 8-22 mins $Therapeutic Activity: 8-22 mins                    G Codes:      Amiel Sharrow, Tessie Fass 09/13/2016, 2:03 PM 09/13/2016  Donnella Sham, PT [phone]  (pager)

## 2016-09-14 ENCOUNTER — Inpatient Hospital Stay (HOSPITAL_COMMUNITY): Payer: Medicare Other | Admitting: Physical Therapy

## 2016-09-14 ENCOUNTER — Inpatient Hospital Stay (HOSPITAL_COMMUNITY): Payer: Medicare Other | Admitting: Speech Pathology

## 2016-09-14 ENCOUNTER — Inpatient Hospital Stay (HOSPITAL_COMMUNITY): Payer: Medicare Other | Admitting: Occupational Therapy

## 2016-09-14 DIAGNOSIS — I609 Nontraumatic subarachnoid hemorrhage, unspecified: Secondary | ICD-10-CM

## 2016-09-14 DIAGNOSIS — S069X3S Unspecified intracranial injury with loss of consciousness of 1 hour to 5 hours 59 minutes, sequela: Secondary | ICD-10-CM

## 2016-09-14 LAB — COMPREHENSIVE METABOLIC PANEL
ALK PHOS: 77 U/L (ref 38–126)
ALT: 20 U/L (ref 14–54)
AST: 48 U/L — ABNORMAL HIGH (ref 15–41)
Albumin: 3.4 g/dL — ABNORMAL LOW (ref 3.5–5.0)
Anion gap: 6 (ref 5–15)
BILIRUBIN TOTAL: 1.1 mg/dL (ref 0.3–1.2)
BUN: 17 mg/dL (ref 6–20)
CALCIUM: 8.5 mg/dL — AB (ref 8.9–10.3)
CO2: 25 mmol/L (ref 22–32)
Chloride: 106 mmol/L (ref 101–111)
Creatinine, Ser: 0.79 mg/dL (ref 0.44–1.00)
Glucose, Bld: 119 mg/dL — ABNORMAL HIGH (ref 65–99)
Potassium: 3.8 mmol/L (ref 3.5–5.1)
Sodium: 137 mmol/L (ref 135–145)
Total Protein: 6.4 g/dL — ABNORMAL LOW (ref 6.5–8.1)

## 2016-09-14 LAB — CBC WITH DIFFERENTIAL/PLATELET
Basophils Absolute: 0 10*3/uL (ref 0.0–0.1)
Basophils Relative: 1 %
Eosinophils Absolute: 0.2 10*3/uL (ref 0.0–0.7)
Eosinophils Relative: 2 %
HCT: 35.3 % — ABNORMAL LOW (ref 36.0–46.0)
HEMOGLOBIN: 12.1 g/dL (ref 12.0–15.0)
LYMPHS ABS: 1.3 10*3/uL (ref 0.7–4.0)
LYMPHS PCT: 17 %
MCH: 30.3 pg (ref 26.0–34.0)
MCHC: 34.3 g/dL (ref 30.0–36.0)
MCV: 88.5 fL (ref 78.0–100.0)
Monocytes Absolute: 0.9 10*3/uL (ref 0.1–1.0)
Monocytes Relative: 11 %
NEUTROS PCT: 69 %
Neutro Abs: 5.5 10*3/uL (ref 1.7–7.7)
Platelets: 238 10*3/uL (ref 150–400)
RBC: 3.99 MIL/uL (ref 3.87–5.11)
RDW: 13.5 % (ref 11.5–15.5)
WBC: 8 10*3/uL (ref 4.0–10.5)

## 2016-09-14 MED ORDER — METOPROLOL TARTRATE 25 MG PO TABS
25.0000 mg | ORAL_TABLET | Freq: Two times a day (BID) | ORAL | Status: DC
  Administered 2016-09-14 – 2016-09-25 (×22): 25 mg via ORAL
  Filled 2016-09-14 (×23): qty 1

## 2016-09-14 NOTE — Progress Notes (Addendum)
82 y.o.right handed femalewith history of CAD status post NSTEMIwith stenting maintained on Plavix and aspirin, hypertension, pulmonary fibrosis.Per chart review patient lives alone in [place]. Independent with occasional cane and still driving. Son and daughter in the area work.Presented 09/09/2016 after syncopal episode while working at [employer]. Denied any chest pain or shortness of breath. Cranial CT scan showed extensive subarachnoid hemorrhage throughout the right sylvian fissure and lateral left temporal lobe. Hemorrhagic contusion anterior right temporal tip and anterior frontal lobes right greater than left. Large right periorbital hematoma with extensive inflammatory changes. Comminuted anterior maxillary sinus fracture with blood throughout the right maxillary sinus. Minimally displaced frontal process maxillary fracture right nasal bone fracture. CT cervical spine with no acute abnormalities. Troponin negative. Neurosurgery Dr. Gerrit Halls care. Advised to resume aspirin in 1 week and Plavix in 2 weeks. Auto laryngology Dr. Wilburn Cornelia also advising conservative care of multiple facial fractures.  Subjective/Complaints: Slept well no c/os  ROS No N/V/D, no facial pain or HA, no CP or SOB Objective: Vital Signs: Blood pressure (!) 166/77, pulse 88, temperature 98.8 F (37.1 C), temperature source Oral, resp. rate 18, SpO2 100 %. No results found. Results for orders placed or performed during the hospital encounter of 09/09/16 (from the past 72 hour(s))  CBC with Differential/Platelet     Status: None   Collection Time: 09/12/16  2:59 AM  Result Value Ref Range   WBC 8.1 4.0 - 10.5 K/uL   RBC 4.11 3.87 - 5.11 MIL/uL   Hemoglobin 12.3 12.0 - 15.0 g/dL   HCT 37.3 36.0 - 46.0 %   MCV 90.8 78.0 - 100.0 fL   MCH 29.9 26.0 - 34.0 pg   MCHC 33.0 30.0 - 36.0 g/dL   RDW 13.9 11.5 - 15.5 %   Platelets 208 150 - 400 K/uL   Neutrophils Relative % 71 %    Neutro Abs 5.7 1.7 - 7.7 K/uL   Lymphocytes Relative 15 %   Lymphs Abs 1.2 0.7 - 4.0 K/uL   Monocytes Relative 10 %   Monocytes Absolute 0.8 0.1 - 1.0 K/uL   Eosinophils Relative 4 %   Eosinophils Absolute 0.3 0.0 - 0.7 K/uL   Basophils Relative 0 %   Basophils Absolute 0.0 0.0 - 0.1 K/uL  Basic metabolic panel     Status: Abnormal   Collection Time: 09/12/16  2:59 AM  Result Value Ref Range   Sodium 141 135 - 145 mmol/L   Potassium 3.8 3.5 - 5.1 mmol/L   Chloride 109 101 - 111 mmol/L   CO2 25 22 - 32 mmol/L   Glucose, Bld 107 (H) 65 - 99 mg/dL   BUN 11 6 - 20 mg/dL   Creatinine, Ser 0.79 0.44 - 1.00 mg/dL   Calcium 8.6 (L) 8.9 - 10.3 mg/dL   GFR calc non Af Amer >60 >60 mL/min   GFR calc Af Amer >60 >60 mL/min    Comment: (NOTE) The eGFR has been calculated using the CKD EPI equation. This calculation has not been validated in all clinical situations. eGFR's persistently <60 mL/min signify possible Chronic Kidney Disease.    Anion gap 7 5 - 15     HEENT: periorbital bruising Cardio: RRR and no murmur Resp: CTA B/L and unlabored GI: BS positive and NT, ND Extremity:  BS positive and NT, ND Skin:   Bruise maxillary Neuro: Alert/Oriented, Normal Sensory and Abnormal Motor 4/5 in BUE and BLE Musc/Skel:  Normal Gen NAD   Assessment/Plan: 1.  Functional deficits secondary to TBI/SDH/SAH which require 3+ hours per day of interdisciplinary therapy in a comprehensive inpatient rehab setting. Physiatrist is providing close team supervision and 24 hour management of active medical problems listed below. Physiatrist and rehab team continue to assess barriers to discharge/monitor patient progress toward functional and medical goals. FIM:       Function - Toileting Toileting steps completed by patient: Performs perineal hygiene Toileting steps completed by helper: Adjust clothing prior to toileting, Adjust clothing after toileting Toileting Assistive Devices: Grab bar or  rail Assist level: Touching or steadying assistance (Pt.75%)  Function - Air cabin crew transfer assistive device: Grab bar Assist level to toilet: Moderate assist (Pt 50 - 74%/lift or lower) Assist level from toilet: Moderate assist (Pt 50 - 74%/lift or lower)        Function - Comprehension Comprehension: Auditory Comprehension assist level: Understands basic 75 - 89% of the time/ requires cueing 10 - 24% of the time  Function - Expression Expression: Verbal Expression assist level: Expresses basic 75 - 89% of the time/requires cueing 10 - 24% of the time. Needs helper to occlude trach/needs to repeat words.  Function - Social Interaction Social Interaction assist level: Interacts appropriately 75 - 89% of the time - Needs redirection for appropriate language or to initiate interaction.  Function - Problem Solving Problem solving assist level: Solves basic 75 - 89% of the time/requires cueing 10 - 24% of the time  Function - Memory Memory assist level: Recognizes or recalls 75 - 89% of the time/requires cueing 10 - 24% of the time Patient normally able to recall (first 3 days only): Current season  Medical Problem List and Plan: 1.  Traumatic TBI /SDH/SAH as well as maxillofacial fractures secondary to syncopal event. PT and OT and SLP evals today 2.  DVT Prophylaxis/Anticoagulation: SCDs. Monitor for any signs of DVT 3. Pain Management: Flexeril as needed for muscle spasms as prior to admission. 4. Seizure prophylaxis. Keppra 500 mg twice a day. EEG negative 5. Neuropsych: This patient is capable of making decisions on her own behalf. 6. Skin/Wound Care: Routine skin checks 7. Fluids/Electrolytes/Nutrition: Routine I&O with Normal f/u chemistries 8. CAD/NSTEMI status post stenting. Plan to resume aspirin in 1 week a 81 mg daily and Plavix in 2 weeks. No chest pain or shortness of breath 9. Hypertension. Lisinopril 10 mg daily, Lopressor 12.5 mg twice a day. Monitor  with increased mobility, will increase lopressor Vitals:   09/13/16 1953 09/14/16 0529  BP: (!) 168/54 (!) 166/77  Pulse: 78 88  Resp:  18  Temp:  98.8 F (37.1 C)    10. Hypothyroidism. Synthroid. 11. Hyperlipidemia.Zetia/Lipitor   LOS (Days) 1 A FACE TO FACE EVALUATION WAS PERFORMED  Amirrah Quigley E 09/14/2016, 6:32 AM

## 2016-09-14 NOTE — Evaluation (Signed)
Speech Language Pathology Assessment and Plan  Patient Details  Name: Kristin Shaffer MRN: [id number] Date of Birth: 15-Jul-1931  SLP Diagnosis: Cognitive Impairments  Rehab Potential: Good ELOS: 7 days     Today's Date: 09/14/2016 SLP Individual Time: 0800-0900 SLP Individual Time Calculation (min): 60 min    Problem List: Patient Active Problem List   Diagnosis Date Noted  . Fall 09/13/2016  . Syncope   . Closed fracture of maxillary sinus ([place])   . Subarachnoid hemorrhage ([place])   . Subdural hematoma ([place])   . Seizure prophylaxis   . Coronary artery disease involving coronary bypass graft of native heart without angina pectoris   . Benign essential HTN   . Facial fracture ([place]) 09/09/2016  . TBI (traumatic brain injury) ([place]) 09/09/2016  . Pulmonary fibrosis ([place]) 05/22/2016  . DOE (dyspnea on exertion) 04/14/2016  . Right flank pain 04/14/2016  . Pulmonary hypertension 04/14/2016  . Muscle spasm of back 09/15/2015  . Myalgia 03/23/2015  . Intermediate coronary syndrome ([place]) 12/07/2013  . Hyperlipidemia 12/03/2013  . Presence of drug coated stent in right coronary artery 03/22/2013  . NSTEMI (non-ST elevated myocardial infarction) ([place]) 03/21/2013  . Unstable angina 03/20/2013  . Hypertensive urgency -Accelerated Hypertension 03/20/2013  . GERD (gastroesophageal reflux disease) 03/20/2013  . Hypothyroidism 03/20/2013   Past Medical History:  Past Medical History:  Diagnosis Date  . Anxiety   . Arthritis    "different places; not bad" (03/20/2013)  . Coronary artery disease   . Exertional shortness of breath   . GERD (gastroesophageal reflux disease)   . High cholesterol   . Hypertension   . Hypothyroidism   . Myocardial infarction    "Dr's saw evidence I might have had a heart attack" (03/20/2013)  . NSTEMI (non-ST elevated myocardial infarction) ([place]) 03/20/2013   cath - mid RCA  . Pneumonia 2012  . Skin cancer    "both legs; right arm" (03/20/2013)    Past Surgical History:  Past Surgical History:  Procedure Laterality Date  . APPENDECTOMY  2003  . BREAST BIOPSY Bilateral 1990s   "total of 5; 2 on one side, 3 on the other; all benign" (03/20/2013)  . CATARACT EXTRACTION W/ INTRAOCULAR LENS  IMPLANT, BILATERAL Bilateral ~ 2008  . CORONARY ANGIOPLASTY  12/08/2013  . CORONARY ANGIOPLASTY WITH STENT PLACEMENT  03/20/2013   NSTEMI - subtotal occlusion of mid RCA - PCI of mid RCA of long tubular 40-60% lesion - 2 tandem 95-99% subtotal occlusions - 3 overlapping Xience Xpedition DES (Dr. Roni Bread)   . [place] OF UTERUS  1956?  [facility] LEFT HEART CATHETERIZATION WITH CORONARY ANGIOGRAM N/A 03/20/2013   Procedure: LEFT HEART CATHETERIZATION WITH CORONARY ANGIOGRAM;  Surgeon: Sanda Klein, MD;  Location: [facility] CATH LAB;  Service: Cardiovascular;  Laterality: N/A;  . LEFT HEART CATHETERIZATION WITH CORONARY ANGIOGRAM N/A 12/08/2013   Procedure: LEFT HEART CATHETERIZATION WITH CORONARY ANGIOGRAM;  Surgeon: Blane Ohara, MD;  Location: [facility] CATH LAB;  Service: Cardiovascular;  Laterality: N/A;  . SKIN CANCER EXCISION     "1 off right arm; 2 off each leg" (03/20/2013)  . TRANSTHORACIC ECHOCARDIOGRAM  03/2013   EF 62-26%, grade 1 diastolic dysfunction; mild MR; calcifed MV annulus; LA mildly dilated    Assessment / Plan / Recommendation Clinical Impression   Kristin Shaffer a 82 y.o.right handed femalewho presented 09/09/2016 after syncopal episode while working at [employer].  Cranial CT scan showed extensive subarachnoid hemorrhage throughout the right sylvian fissure  and lateral left temporal lobe. Hemorrhagic contusion anterior right temporal tip and anterior frontal lobes right greater than left. Large right periorbital hematoma with extensive inflammatory changes. Tolerating a regular diet. Physical and occupational therapy evaluation completed 09/12/2016 with recommendations of physical medicine rehabilitation consult.Patient was  admitted for a comprehensive rehabilitation program on 09/13/2016  SLP evaluation completed on 10/19/ 2017 with the following results:  Pt presents with mild higher level cognitive impairment, specifically related to working memory and emergent awareness of deficits.  Pt was independent prior to admission and managing her own medications and finances.  The abovementioned deficits would impact her ability to complete familiar self care and home management tasks independently.  As a result, pt would benefit from skilled ST while inpatient in order to maximize functional independence and reduce burden of care prior to discharge.     Skilled Therapeutic Interventions          Cognitive-linguistic evaluation completed with results and recommendations reviewed with patient and family. Pt needed up to min assist to recognize and correct errors during medication and financial management subtests of the ALFA standardized cognitive assessment.  Pt did not appear to recognize errors as they occurred but pt's daughter in law and son both endorsed pt seemed "slower" in comparison to baseline.  All in agreement with recommendations for ST follow up while inpatient.  Pt was left in bed with family members present.       SLP Assessment  Patient will need skilled [place] [facility] during CIR admission    Recommendations  Patient destination: Home Follow up Recommendations: Home Health SLP;Outpatient SLP;24 hour supervision/assistance Equipment Recommended: None recommended by SLP    SLP Frequency 3 to 5 out of 7 days   SLP Duration  SLP Intensity  SLP Treatment/Interventions 7 days   Minumum of 1-2 x/day, 30 to 90 minutes  Cognitive remediation/compensation;Cueing hierarchy;Functional tasks;Internal/external aids;Patient/family education    Pain Pain Assessment Pain Assessment: No/denies pain Pain Score: 0-No pain  Prior Functioning Cognitive/Linguistic Baseline: Within functional  limits Type of Home: House  Lives With: Alone Available Help at Discharge: Family;Available 24 hours/day Vocation: Retired  Function:  Eating Eating                 Cognition Comprehension Comprehension assist level: Follows basic conversation/direction with no assist  Expression   Expression assist level: Expresses basic needs/ideas: With no assist  Social Interaction Social Interaction assist level: Interacts appropriately 90% of the time - Needs monitoring or encouragement for participation or interaction.  Problem Solving Problem solving assist level: Solves basic 75 - 89% of the time/requires cueing 10 - 24% of the time  Memory Memory assist level: Recognizes or recalls 75 - 89% of the time/requires cueing 10 - 24% of the time   Short Term Goals: Week 1: SLP Short Term Goal 1 (Week 1): STG=LTG due to ELOS   Refer to Care Plan for Long Term Goals  Recommendations for other services: None  Discharge Criteria: Patient will be discharged from SLP if patient refuses treatment 3 consecutive times without medical reason, if treatment goals not met, if there is a change in medical status, if patient makes no progress towards goals or if patient is discharged from hospital.  The above assessment, treatment plan, treatment alternatives and goals were discussed and mutually agreed upon: by patient and by family  Adonay Scheier, Selinda Orion 09/14/2016, 12:15 PM

## 2016-09-14 NOTE — Progress Notes (Signed)
Patient information reviewed and entered into eRehab system by Shailen Thielen, RN, CRRN, PPS Coordinator.  Information including medical coding and functional independence measure will be reviewed and updated through discharge.     Per nursing patient was given "Data Collection Information Summary for Patients in Inpatient Rehabilitation Facilities with attached "Privacy Act Statement-Health Care Records" upon admission.  

## 2016-09-14 NOTE — Evaluation (Addendum)
Occupational Therapy Assessment and Plan  Patient Details  Name: Kristin Shaffer MRN: [id number] Date of Birth: [birth year]  OT Diagnosis: altered mental status, cognitive deficits and muscle weakness (generalized) Rehab Potential: Rehab Potential (ACUTE ONLY): Excellent ELOS: 7-10 days   Today's Date: 09/14/2016 Session 1 OT Individual Time: 1000-1100 OT Individual Time Calculation (min): 60 min   Session 2 OT Individual Time: 9326-7124 OT Individual Time Calculation (min): 70 min      Problem List:  Patient Active Problem List   Diagnosis Date Noted  . Fall 09/13/2016  . Syncope   . Closed fracture of maxillary sinus ([place])   . Subarachnoid hemorrhage ([place])   . Subdural hematoma ([place])   . Seizure prophylaxis   . Coronary artery disease involving coronary bypass graft of native heart without angina pectoris   . Benign essential HTN   . Facial fracture ([place]) 09/09/2016  . TBI (traumatic brain injury) ([place]) 09/09/2016  . Pulmonary fibrosis ([place]) 05/22/2016  . Kristin Shaffer (dyspnea on exertion) 04/14/2016  . Right flank pain 04/14/2016  . Pulmonary hypertension 04/14/2016  . Muscle spasm of back 09/15/2015  . Myalgia 03/23/2015  . Intermediate coronary syndrome ([place]) 12/07/2013  . Hyperlipidemia 12/03/2013  . Presence of drug coated stent in right coronary artery 03/22/2013  . NSTEMI (non-ST elevated myocardial infarction) ([place]) 03/21/2013  . Unstable angina 03/20/2013  . Hypertensive urgency -Accelerated Hypertension 03/20/2013  . GERD (gastroesophageal reflux disease) 03/20/2013  . Hypothyroidism 03/20/2013    Past Medical History:  Past Medical History:  Diagnosis Date  . Anxiety   . Arthritis    "different places; not bad" (03/20/2013)  . Coronary artery disease   . Exertional shortness of breath   . GERD (gastroesophageal reflux disease)   . High cholesterol   . Hypertension   . Hypothyroidism   . Myocardial infarction    "Dr's saw evidence I might have had a heart  attack" (03/20/2013)  . NSTEMI (non-ST elevated myocardial infarction) ([place]) 03/20/2013   cath - mid RCA  . Pneumonia 2012  . Skin cancer    "both legs; right arm" (03/20/2013)   Past Surgical History:  Past Surgical History:  Procedure Laterality Date  . APPENDECTOMY  2003  . BREAST BIOPSY Bilateral 1990s   "total of 5; 2 on one side, 3 on the other; all benign" (03/20/2013)  . CATARACT EXTRACTION W/ INTRAOCULAR LENS  IMPLANT, BILATERAL Bilateral ~ 2008  . CORONARY ANGIOPLASTY  12/08/2013  . CORONARY ANGIOPLASTY WITH STENT PLACEMENT  03/20/2013   NSTEMI - subtotal occlusion of mid RCA - PCI of mid RCA of long tubular 40-60% lesion - 2 tandem 95-99% subtotal occlusions - 3 overlapping Xience Xpedition DES (Dr. Roni Bread)   . [place] OF UTERUS  1956?  Marland Kitchen LEFT HEART CATHETERIZATION WITH CORONARY ANGIOGRAM N/A 03/20/2013   Procedure: LEFT HEART CATHETERIZATION WITH CORONARY ANGIOGRAM;  Surgeon: Sanda Klein, MD;  Location: [facility] CATH LAB;  Service: Cardiovascular;  Laterality: N/A;  . LEFT HEART CATHETERIZATION WITH CORONARY ANGIOGRAM N/A 12/08/2013   Procedure: LEFT HEART CATHETERIZATION WITH CORONARY ANGIOGRAM;  Surgeon: Blane Ohara, MD;  Location: [facility] C[facility] LAB;  Service: Cardiovascular;  Laterality: N/A;  . SKIN CANCER EXCISION     "1 off right arm; 2 off each leg" (03/20/2013)  . TRANSTHORACIC ECHOCARDIOGRAM  03/2013   EF 58-09%, grade 1 diastolic dysfunction; mild MR; calcifed MV annulus; LA mildly dilated    Assessment & Plan Clinical Impression: Patient is a 82 y.o. year old  female with recent admission to the hospital on 09/09/2016 after syncopal episode while working at [employer]. Denied any chest pain or shortness of breath. Cranial CT scan showed extensive subarachnoid hemorrhage throughout the right sylvian fissure and lateral left temporal lobe. Hemorrhagic contusion anterior right temporal tip and anterior frontal lobes right greater than left. Large right  periorbital hematoma with extensive inflammatory changes. Comminuted anterior maxillary sinus fracture with blood throughout the right maxillary sinus. Minimally displaced frontal process maxillary fracture right nasal bone fracture. Patient transferred to CIR on 09/13/2016 .    Patient currently requires min/ mod with basic self-care skills and IADL secondary to muscle weakness, decreased visual perceptual skills, decreased attention, decreased awareness, decreased problem solving, decreased safety awareness and decreased memory and decreased standing balance and decreased balance strategies.  Prior to hospitalization, patient could complete BADL and IADL with independent .  Patient will benefit from skilled intervention to increase independence with basic self-care skills prior to discharge home with care partner.  Anticipate patient will require 24 hour supervision and follow up home health.  OT - End of Session Activity Tolerance: Tolerates < 10 min activity, no significant change in vital signs Endurance Deficit: Yes Endurance Deficit Description: patient required seated rest breaks with ambulation and stairs OT Assessment Rehab Potential (ACUTE ONLY): Excellent OT Patient demonstrates impairments in the following area(s): Balance;Cognition;Endurance;Motor;Perception;Safety OT Basic ADL's Functional Problem(s): Grooming;Bathing;Dressing;Toileting OT Advanced ADL's Functional Problem(s): Simple Meal Preparation;Laundry OT Transfers Functional Problem(s): Toilet;Tub/Shower OT Additional Impairment(s): None OT Plan OT Intensity: Minimum of 1-2 x/day, 45 to 90 minutes OT Frequency: 5 out of 7 days OT Duration/Estimated Length of Stay: 7-9 days OT Treatment/Interventions: Balance/vestibular training;Cognitive remediation/compensation;Community reintegration;Discharge planning;DME/adaptive equipment instruction;Functional mobility training;Patient/family education;Self Care/advanced ADL  retraining;Therapeutic Activities;Therapeutic Exercise;UE/LE Strength taining/ROM;UE/LE Coordination activities;Visual/perceptual remediation/compensation OT Basic Self-Care Anticipated Outcome(s): Mod I OT Toileting Anticipated Outcome(s): Mod I OT Bathroom Transfers Anticipated Outcome(s): Mod I OT Recommendation Patient destination: Home Follow Up Recommendations: Home health OT Equipment Recommended: To be determined   Skilled Therapeutic Intervention Session 1 Initial eval completed with treatment provided to address functional transfers, improved safety awareness, standing tolerance/balance, and adapted bathing/dressing skills. Pt completed stand-step turn transfers to w/c, toilet, and shower chair with Min A and instructional cues for safety. Overshooting noticed during grooming tasks. Pt required Min A to maintain standing balance while standing at the sink w/ 2 near lateral LOB to R. Mod A for LB dressing 2/2 difficulty problem solving to thread LE's in correct leg.   Session 2 1:1 OT session focused on functional mobility, standing balance, problem solving, visual scanning, and visual perceptual skills. Pt had difficulty problem solving visiospacial relationships to copy peg board picture correctly. OT graded activity and covered up vertical plane and pt able to correct mistake with questioning cues and increased time. Visual scanning incorporated with word search and number scavenger hunt in hallway. Pt required Min questioning cues to locate 5 words on large print crossword puzzle. Pt required Min A w/ functional mobility w/ Rw, w/ Mod A and Mod cues for balance and walker safety w/ head turns to locate numbers in hallway.  OT Evaluation Precautions/Restrictions  Precautions Precautions: Fall Precaution Comments: hx of syncope Restrictions Weight Bearing Restrictions: No Pain Pain Assessment Pain Assessment: No/denies pain Home Living/Prior Functioning Home  Living Family/patient expects to be discharged to:: Private residence Living Arrangements: Alone Available Help at Discharge: Family, Available 24 hours/day (daughter lives next door) Type of Home: House Home Access: Stairs to enter Entrance  Stairs-Number of Steps: 2 Entrance Stairs-Rails: Right Home Layout: Two level, Able to live on main level with bedroom/bathroom Bathroom Shower/Tub: Chiropodist: Standard Bathroom Accessibility: Yes  Lives With: Alone IADL History Homemaking Responsibilities: Yes Meal Prep Responsibility: Primary Laundry Responsibility: Primary Cleaning Responsibility: Primary Bill Paying/Finance Responsibility: Primary Shopping Responsibility: Primary Current License: Yes Mode of Transportation: Car Leisure and Hobbies: Enjoys volunteering for food pantry at [employer] and caring for sick church members. Pt also starts every morning with a crossword puzzle.  Prior Function Level of Independence: Independent with basic ADLs, Independent with transfers, Independent with gait, Requires assistive device for independence, Independent with homemaking with ambulation Driving: Yes Vocation: Retired Leisure: Hobbies-yes (Comment) Comments: 4 prong cane and RW used occassionaly  ADL ADL ADL Comments: See functional navigator Vision/Perception  Vision- History Baseline Vision/History: Wears glasses  Cognition Overall Cognitive Status: Impaired/Different from baseline Arousal/Alertness: Awake/alert Orientation Level: Person;Place;Situation Person: Oriented Place: Oriented Situation: Oriented Year: 2017 Month: October Day of Week: Correct Memory: Impaired Memory Impairment: Other (comment) (working memory) Immediate Memory Recall: Sock;Blue;Bed Memory Recall: Sock;Blue Memory Recall Sock: With Cue Memory Recall Blue: With Cue Awareness: Impaired Awareness Impairment: Emergent impairment Problem Solving: Impaired Problem Solving  Impairment: Functional complex Executive Function: Self Monitoring;Self Correcting Self Monitoring: Impaired Self Monitoring Impairment: Functional complex Self Correcting: Impaired Self Correcting Impairment: Functional complex Safety/Judgment: Impaired [employer] Scales of Cognitive Functioning: Purposeful/appropriate Sensation Sensation Light Touch: Appears Intact Stereognosis: Appears Intact Hot/Cold: Appears Intact Proprioception: Appears Intact Coordination Gross Motor Movements are Fluid and Coordinated: Yes Fine Motor Movements are Fluid and Coordinated: Yes Balance Dynamic Standing Balance Dynamic Standing - Balance Support: During functional activity Dynamic Standing - Level of Assistance: 4: Min assist Extremity/Trunk Assessment RUE Assessment RUE Assessment: Within Functional Limits LUE Assessment LUE Assessment: Within Functional Limits  See Function Navigator for Current Functional Status.   Refer to Care Plan for Long Term Goals  Recommendations for other services: None  Discharge Criteria: Patient will be discharged from OT if patient refuses treatment 3 consecutive times without medical reason, if treatment goals not met, if there is a change in medical status, if patient makes no progress towards goals or if patient is discharged from hospital.  The above assessment, treatment plan, treatment alternatives and goals were discussed and mutually agreed upon: by patient and by family  [name] 09/14/2016, 3:58 PM

## 2016-09-14 NOTE — Care Management Note (Signed)
[facility] Individual Statement of Services  Patient Name:  Kristin Shaffer  Date:  09/14/2016  Welcome to the [facility].  Our goal is to provide you with an individualized program based on your diagnosis and situation, designed to meet your specific needs.  With this comprehensive rehabilitation program, you will be expected to participate in at least 3 hours of rehabilitation therapies Monday-Friday, with modified therapy programming on the weekends.  Your rehabilitation program will include the following services:  Physical Therapy (PT), Occupational Therapy (OT), Speech Therapy (ST), 24 hour per day rehabilitation nursing, Therapeutic Recreaction (TR), Case Management (Social Worker), Rehabilitation Medicine, Nutrition Services and Pharmacy Services  Weekly team conferences will be held on Wednesday to discuss your progress.  Your Social Worker will talk with you frequently to get your input and to update you on team discussions.  Team conferences with you and your family in attendance may also be held.  Expected length of stay: 7-9 days Overall anticipated outcome: mod/i level  Depending on your progress and recovery, your program may change. Your Social Worker will coordinate services and will keep you informed of any changes. Your Social Worker's name and contact numbers are listed  below.  The following services may also be recommended but are not provided by the [facility] will be made to provide these services after discharge if needed.  Arrangements include referral to agencies that provide these services.  Your insurance has been verified to be:  Medicare & Generic Commerical Your primary doctor is:  Juanita Craver  Pertinent information will be shared with your doctor and your insurance  company.  Social Worker:  Ovidio Kin, [place] or ([phone]  Information discussed with and copy given to patient by: Elease Hashimoto, 09/14/2016, 12:19 PM

## 2016-09-14 NOTE — Progress Notes (Signed)
Social Work Assessment and Plan Social Work Assessment and Plan  Patient Details  Name: Kristin Shaffer MRN: [id number] Date of Birth: May 05, 1931  Today's Date: 09/14/2016  Problem List:  Patient Active Problem List   Diagnosis Date Noted  . Fall 09/13/2016  . Syncope   . Closed fracture of maxillary sinus ([place])   . Subarachnoid hemorrhage ([place])   . Subdural hematoma ([place])   . Seizure prophylaxis   . Coronary artery disease involving coronary bypass graft of native heart without angina pectoris   . Benign essential HTN   . Facial fracture ([place]) 09/09/2016  . TBI (traumatic brain injury) ([place]) 09/09/2016  . Pulmonary fibrosis ([place]) 05/22/2016  . DOE (dyspnea on exertion) 04/14/2016  . Right flank pain 04/14/2016  . Pulmonary hypertension 04/14/2016  . Muscle spasm of back 09/15/2015  . Myalgia 03/23/2015  . Intermediate coronary syndrome ([place]) 12/07/2013  . Hyperlipidemia 12/03/2013  . Presence of drug coated stent in right coronary artery 03/22/2013  . NSTEMI (non-ST elevated myocardial infarction) ([place]) 03/21/2013  . Unstable angina 03/20/2013  . Hypertensive urgency -Accelerated Hypertension 03/20/2013  . GERD (gastroesophageal reflux disease) 03/20/2013  . Hypothyroidism 03/20/2013   Past Medical History:  Past Medical History:  Diagnosis Date  . Anxiety   . Arthritis    "different places; not bad" (03/20/2013)  . Coronary artery disease   . Exertional shortness of breath   . GERD (gastroesophageal reflux disease)   . High cholesterol   . Hypertension   . Hypothyroidism   . Myocardial infarction    "Dr's saw evidence I might have had a heart attack" (03/20/2013)  . NSTEMI (non-ST elevated myocardial infarction) ([place]) 03/20/2013   cath - mid RCA  . Pneumonia 2012  . Skin cancer    "both legs; right arm" (03/20/2013)   Past Surgical History:  Past Surgical History:  Procedure Laterality Date  . APPENDECTOMY  2003  . BREAST BIOPSY Bilateral 1990s   "total of  5; 2 on one side, 3 on the other; all benign" (03/20/2013)  . CATARACT EXTRACTION W/ INTRAOCULAR LENS  IMPLANT, BILATERAL Bilateral ~ 2008  . CORONARY ANGIOPLASTY  12/08/2013  . CORONARY ANGIOPLASTY WITH STENT PLACEMENT  03/20/2013   NSTEMI - subtotal occlusion of mid RCA - PCI of mid RCA of long tubular 40-60% lesion - 2 tandem 95-99% subtotal occlusions - 3 overlapping Xience Xpedition DES (Dr. Roni Bread)   . [place] OF UTERUS  1956?  Marland Kitchen LEFT HEART CATHETERIZATION WITH CORONARY ANGIOGRAM N/A 03/20/2013   Procedure: LEFT HEART CATHETERIZATION WITH CORONARY ANGIOGRAM;  Surgeon: Sanda Klein, MD;  Location: [facility] CATH LAB;  Service: Cardiovascular;  Laterality: N/A;  . LEFT HEART CATHETERIZATION WITH CORONARY ANGIOGRAM N/A 12/08/2013   Procedure: LEFT HEART CATHETERIZATION WITH CORONARY ANGIOGRAM;  Surgeon: Blane Ohara, MD;  Location: [facility] CATH LAB;  Service: Cardiovascular;  Laterality: N/A;  . SKIN CANCER EXCISION     "1 off right arm; 2 off each leg" (03/20/2013)  . TRANSTHORACIC ECHOCARDIOGRAM  03/2013   EF 123456, grade 1 diastolic dysfunction; mild MR; calcifed MV annulus; LA mildly dilated   Social History:  reports that she has never smoked. She has never used smokeless tobacco. She reports that she does not drink alcohol or use drugs.  Family / Support Systems Marital Status: Widow/Widower Patient Roles: Parent, Volunteer ChildrenJosefine Class  [id number]  [phone]   Other Supports: Richardson Dopp  [id number]  Denise-daughter in-law [phone]-cell Anticipated Caregiver: Son and daughter in-law  [place] stay at [place] Yolanda Bonine does live next door. Ability/Limitations of Caregiver: Son and daughter in-law will come and stay for a short time with pt.  Caregiver Availability: 24/7 (For short time) Family Dynamics: Close knit family who are all involved and rallying around pt at this time. She has  numerous family, friends and neighbors who will look out for her and will assist her ar discharge.   Social History Preferred language: English Religion: Methodist Cultural Background: No issues Education: [employer] Read: Yes Write: Yes Employment Status: Retired Freight forwarder Issues: No issues Guardian/Conservator: None-according to MD pt is capable of making her own decisions while here but will make sure a family member is here also   Abuse/Neglect Physical Abuse: Denies Verbal Abuse: Denies Sexual Abuse: Denies Exploitation of patient/patient's resources: Denies Self-Neglect: Denies  Emotional Status Pt's affect, behavior adn adjustment status: Pt is doing well and reports no pain with her facial fractures and extreme bruising of her face and arms. She is a tough ole bird according to her neighbor who is here. Pt just laughs and says she wants to get back to her independent level. Recent Psychosocial Issues: other health issues-recent diagnosis spinal stenosis-which was making her sit to stands hard Pyschiatric History: History of anxiety takes a medication for this and feels it is helpful and is coping appropriately with move to rehab and recovering. Will monitor through team and see if would need to see neuro-psych while here, although will be short length of stay due to high level Substance Abuse History: No issues  Patient / Family Perceptions, Expectations & Goals Pt/Family understanding of illness & functional limitations: Pt and neighbor can explain her injuries and fractures. She talks with the MD and feels her questions are being answered. Her sister in-law was here earlier and spoke with the MD and feels her concerns were addressed. Premorbid pt/family roles/activities: Mother, grandmother, church member, Technical sales engineer, retiree, Psychologist, occupational, Social research officer, government Anticipated changes in roles/activities/participation: resume Pt/family expectations/goals: Pt states: " I want to be  able to move around on my own, so maybe be here one week."  [place] states: " There are enough of us-between family and freinds, we will make sure she has what she needs."  [employer]: None Premorbid Home Care/DME Agencies: None Transportation available at discharge: Family and friends  Discharge Planning Living Arrangements: Alone Support Systems: Children, Other relatives, Water engineer, Social worker community Type of Residence: Private residence Insurance underwriter Resources: [employer], Multimedia programmer (specify) Marketing executive) Financial Resources: Radio broadcast assistant Screen Referred: No Living Expenses: Own Money Management: Patient Does the patient have any problems obtaining your medications?: No Home Management: Patient will have help now-was limited with cleaning due to back issues Patient/Family Preliminary Plans: Return home with son and daughter in-law staying with her a short time, then extended family and friends checking in on her, once 24 hr care is not needed. Pt only wants to stay here one week then go home. Will see if team feels the same way. Team is continuing to evaluate pt and setting goals. Social Work Anticipated Follow Up Needs: HH/OP  Clinical Impression Very pleasant female who is motivated and has a goal for herself to go home in one week. She is recovering from her fall and making progress in therapies. She has very supportive family and friends and will have them Assisting her once home. Aware can not drive due to seizure meds, her supports will be  providing this for her. Will work on a safe discharge plan and have son and daughter in-law come in and attend therapies with pt prior to  Discharge.  Elease Hashimoto 09/14/2016, 12:37 PM

## 2016-09-14 NOTE — Evaluation (Signed)
Physical Therapy Assessment and Plan  Patient Details  Name: Kristin Shaffer MRN: [id number] Date of Birth: 19-Jul-1931  PT Diagnosis: Abnormal posture, Abnormality of gait, Cognitive deficits and Muscle weakness Rehab Potential: Good ELOS: 7-9 days   Today's Date: 09/14/2016 PT Individual Time: 1100-1155 PT Individual Time Calculation (min): 55 min     Problem List: Patient Active Problem List   Diagnosis Date Noted  . Fall 09/13/2016  . Syncope   . Closed fracture of maxillary sinus ([place])   . Subarachnoid hemorrhage ([place])   . Subdural hematoma ([place])   . Seizure prophylaxis   . Coronary artery disease involving coronary bypass graft of native heart without angina pectoris   . Benign essential HTN   . Facial fracture ([place]) 09/09/2016  . TBI (traumatic brain injury) ([place]) 09/09/2016  . Pulmonary fibrosis ([place]) 05/22/2016  . DOE (dyspnea on exertion) 04/14/2016  . Right flank pain 04/14/2016  . Pulmonary hypertension 04/14/2016  . Muscle spasm of back 09/15/2015  . Myalgia 03/23/2015  . Intermediate coronary syndrome ([place]) 12/07/2013  . Hyperlipidemia 12/03/2013  . Presence of drug coated stent in right coronary artery 03/22/2013  . NSTEMI (non-ST elevated myocardial infarction) ([place]) 03/21/2013  . Unstable angina 03/20/2013  . Hypertensive urgency -Accelerated Hypertension 03/20/2013  . GERD (gastroesophageal reflux disease) 03/20/2013  . Hypothyroidism 03/20/2013    Past Medical History:  Past Medical History:  Diagnosis Date  . Anxiety   . Arthritis    "different places; not bad" (03/20/2013)  . Coronary artery disease   . Exertional shortness of breath   . GERD (gastroesophageal reflux disease)   . High cholesterol   . Hypertension   . Hypothyroidism   . Myocardial infarction    "Dr's saw evidence I might have had a heart attack" (03/20/2013)  . NSTEMI (non-ST elevated myocardial infarction) ([place]) 03/20/2013   cath - mid RCA  . Pneumonia 2012  . Skin cancer     "both legs; right arm" (03/20/2013)   Past Surgical History:  Past Surgical History:  Procedure Laterality Date  . APPENDECTOMY  2003  . BREAST BIOPSY Bilateral 1990s   "total of 5; 2 on one side, 3 on the other; all benign" (03/20/2013)  . CATARACT EXTRACTION W/ INTRAOCULAR LENS  IMPLANT, BILATERAL Bilateral ~ 2008  . CORONARY ANGIOPLASTY  12/08/2013  . CORONARY ANGIOPLASTY WITH STENT PLACEMENT  03/20/2013   NSTEMI - subtotal occlusion of mid RCA - PCI of mid RCA of long tubular 40-60% lesion - 2 tandem 95-99% subtotal occlusions - 3 overlapping Xience Xpedition DES (Dr. Roni Bread)   . [place] OF UTERUS  1956?  Marland Kitchen LEFT HEART CATHETERIZATION WITH CORONARY ANGIOGRAM N/A 03/20/2013   Procedure: LEFT HEART CATHETERIZATION WITH CORONARY ANGIOGRAM;  Surgeon: Sanda Klein, MD;  Location: [facility] CATH LAB;  Service: Cardiovascular;  Laterality: N/A;  . LEFT HEART CATHETERIZATION WITH CORONARY ANGIOGRAM N/A 12/08/2013   Procedure: LEFT HEART CATHETERIZATION WITH CORONARY ANGIOGRAM;  Surgeon: Blane Ohara, MD;  Location: [facility] CATH LAB;  Service: Cardiovascular;  Laterality: N/A;  . SKIN CANCER EXCISION     "1 off right arm; 2 off each leg" (03/20/2013)  . TRANSTHORACIC ECHOCARDIOGRAM  03/2013   EF 09-47%, grade 1 diastolic dysfunction; mild MR; calcifed MV annulus; LA mildly dilated    Assessment & Plan Clinical Impression: Kristin Shaffer a 82 y.o.right handed femalewith history of CAD status post NSTEMIwith stenting maintained on Plavix and aspirin, hypertension, pulmonary fibrosis.Per chart review patient lives alone in  [place]. Independent with occasional cane and still driving. Son and daughter in the area work.Presented 09/09/2016 after syncopal episode while working at [employer]. Denied any chest pain or shortness of breath. Cranial CT scan showed extensive subarachnoid hemorrhage throughout the right sylvian fissure and lateral left temporal lobe.  Hemorrhagic contusion anterior right temporal tip and anterior frontal lobes right greater than left. Large right periorbital hematoma with extensive inflammatory changes. Comminuted anterior maxillary sinus fracture with blood throughout the right maxillary sinus. Minimally displaced frontal process maxillary fracture right nasal bone fracture. CT cervical spine with no acute abnormalities. Troponin negative. Neurosurgery Dr. Gerrit Halls care. Advised to resume aspirin in 1 week and Plavix in 2 weeks. Auto laryngology Dr. Wilburn Cornelia also advising conservative care of multiple facial fractures. Echocardiogram for workup of syncope showed ejection fraction of 70% no wall motion abnormalities. EEG showed severe diffuse cerebral dysfunction no seizure activity. Maintained on Keppra for seizure prophylaxis. Tolerating a regular diet. Physical and occupational therapy evaluation completed 09/12/2016 with recommendations of physical medicine rehabilitation consult. Patient transferred to CIR on 09/13/2016.   Patient currently requires min with mobility secondary to muscle weakness, decreased cardiorespiratoy endurance and decreased standing balance, decreased postural control and decreased balance strategies.  Prior to hospitalization, patient was modified independent with mobility and lived with Alone in a House home.  Home access is 2Stairs to enter.  Patient will benefit from skilled PT intervention to maximize safe functional mobility, minimize fall risk and decrease caregiver burden for planned discharge home with intermittent assist.  Anticipate patient will benefit from follow up [facility] at discharge.  PT - End of Session Activity Tolerance: Decreased this session;Tolerates 30+ min activity with multiple rests Endurance Deficit: Yes Endurance Deficit Description: patient required seated rest breaks with ambulation and stairs PT Assessment Rehab Potential (ACUTE/IP ONLY): Good PT Patient  demonstrates impairments in the following area(s): Balance;Behavior;Endurance;Motor;Pain;Safety PT Transfers Functional Problem(s): Bed Mobility;Bed to Chair;Car;Furniture PT Locomotion Functional Problem(s): Ambulation;Wheelchair Mobility;Stairs PT Plan PT Intensity: Minimum of 1-2 x/day ,45 to 90 minutes PT Frequency: 5 out of 7 days PT Duration Estimated Length of Stay: 7-9 days PT Treatment/Interventions: Ambulation/gait training;Balance/vestibular training;Cognitive remediation/compensation;Community reintegration;Discharge planning;Disease management/prevention;DME/adaptive equipment instruction;Functional mobility training;Neuromuscular re-education;Pain management;Patient/family education;Psychosocial support;Stair training;Therapeutic Activities;Therapeutic Exercise;UE/LE Strength taining/ROM;UE/LE Coordination activities PT Transfers Anticipated Outcome(s): mod I PT Locomotion Anticipated Outcome(s): mod I household, supervision community PT Recommendation Follow Up Recommendations: Home health PT Patient destination: Home Equipment Recommended: None recommended by PT Equipment Details: patient owns RW and quad cane  Skilled Therapeutic Intervention Skilled therapeutic intervention initiated after completion of evaluation. Discussed with patient and daughter falls risk, safety within room, and focus of therapy during stay. Discussed possible length of stay, goals, and follow-up therapy. Patient requires min A overall with HHA and progressing to min A-supervision using RW with impaired safety awareness noted during session. Patient demonstrates high fall risk as noted by score of 32/56 on Berg Balance Scale. Patient's daughter reports that patient's mobility deficits and gait are close to baseline. Patient left semi reclined in bed with all needs within reach and daughter present.   PT Evaluation Precautions/Restrictions Precautions Precautions: Fall Precaution Comments: hx of  syncope Restrictions Weight Bearing Restrictions: No General Chart Reviewed: Yes Family/Caregiver Present: No  Pain Pain Assessment Pain Assessment: No/denies pain Pain Score: 0-No pain Home Living/Prior Functioning Home Living Available Help at Discharge: Family;Available 24 hours/day Type of Home: House Home Access: Stairs to enter [employer] of Steps: 2 Entrance Stairs-Rails: Right Home Layout: Two level;Able  to live on main level with bedroom/bathroom Bathroom Shower/Tub: Chiropodist: Standard Bathroom Accessibility: Yes  Lives With: Alone Prior Function Level of Independence: Independent with basic ADLs;Independent with transfers;Independent with gait;Requires assistive device for independence;Independent with homemaking with ambulation Driving: Yes Vocation: Retired Leisure: Hobbies-yes (Comment) Comments: used RW and quad cane when needed, enjoys cooking, 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 Vision/Perception   No change from baseline  Cognition Overall Cognitive Status: Impaired/Different from baseline Arousal/Alertness: Awake/alert Orientation Level: Oriented X4 Memory: Impaired Memory Impairment: Other (comment) (working memory ) Awareness: Impaired Awareness Impairment: Emergent impairment Problem Solving: Impaired Problem Solving Impairment: Functional complex Executive Function: Self Monitoring;Self Correcting Self Monitoring: Impaired Self Monitoring Impairment: Functional complex Self Correcting: Impaired Self Correcting Impairment: Functional complex Safety/Judgment: Impaired Sensation Sensation Light Touch: Appears Intact Stereognosis: Appears Intact Hot/Cold: Appears Intact Proprioception: Appears Intact Coordination Gross Motor Movements are Fluid and Coordinated: Yes Fine Motor Movements are Fluid and Coordinated: Yes Motor  Motor Motor: Abnormal postural alignment and control Motor - Skilled Clinical Observations:  impaired standing balance  Mobility Bed Mobility Bed Mobility: Rolling Right;Rolling Left;Sit to Supine;Supine to Sit Rolling Right: 5: Supervision Rolling Left: 5: Supervision Supine to Sit: 5: Supervision Sit to Supine: 5: Supervision Transfers Transfers: Yes Sit to Stand: 4: Min assist;With upper extremity assist Stand to Sit: 5: Supervision;With upper extremity assist Locomotion  Ambulation Ambulation: Yes Ambulation/Gait Assistance: 4: Min assist Ambulation Distance (Feet): 150 Feet Assistive device: 1 person hand held assist;Rolling walker Gait Gait: Yes Gait Pattern: Impaired Gait Pattern: Trunk flexed;Decreased stride length;Step-through pattern;Lateral trunk lean to right;Lateral trunk lean to left Gait velocity: 10 MWT = 0.37 m/s Stairs / Additional Locomotion Stairs: Yes Stairs Assistance: 5: Supervision Stair Management Technique: Two rails;Step to pattern;Forwards Number of Stairs: 12 Height of Stairs: 3 (8 3", 4 6") Ramp: 4: Min assist Curb: 4: Min Administrator Mobility: No  Trunk/Postural Assessment  Cervical Assessment Cervical Assessment: Exceptions to [facility] (slightly forward head) Thoracic Assessment Thoracic Assessment: Exceptions to [facility] (rounded shoulders) Lumbar Assessment Lumbar Assessment: Exceptions to [facility] (posterior pelvic tilt) Postural Control Postural Control: Deficits on evaluation Protective Responses: impaired  Balance Balance Balance Assessed: Yes Standardized Balance Assessment Standardized Balance Assessment: Berg Balance Test Berg Balance Test Sit to Stand: Able to stand  independently using hands Standing Unsupported: Able to stand safely 2 minutes Sitting with Back Unsupported but Feet Supported on Floor or Stool: Able to sit safely and securely 2 minutes Stand to Sit: Controls descent by using hands Transfers: Needs one person to assist Standing Unsupported with Eyes Closed: Able to stand 10 seconds  with supervision Standing Ubsupported with Feet Together: Able to place feet together independently but unable to hold for 30 seconds From Standing, Reach Forward with Outstretched Arm: Reaches forward but needs supervision From Standing Position, Pick up Object from Floor: Able to pick up shoe, needs supervision From Standing Position, Turn to Look Behind Over each Shoulder: Looks behind from both sides and weight shifts well Turn 360 Degrees: Needs close supervision or verbal cueing Standing Unsupported, Alternately Place Feet on Step/Stool: Able to complete >2 steps/needs minimal assist Standing Unsupported, One Foot in Front: Able to take small step independently and hold 30 seconds Standing on One Leg: Unable to try or needs assist to prevent fall Total Score: 32 Dynamic Standing Balance Dynamic Standing - Balance Support: Left upper extremity supported;During functional activity Dynamic Standing - Level of Assistance: 4: Min assist Extremity Assessment   RLE Assessment RLE Assessment: Within  Functional Limits LLE Assessment LLE Assessment: Within Functional Limits   See Function Navigator for Current Functional Status.   Refer to Care Plan for Long Term Goals  Recommendations for other services: None  Discharge Criteria: Patient will be discharged from PT if patient refuses treatment 3 consecutive times without medical reason, if treatment goals not met, if there is a change in medical status, if patient makes no progress towards goals or if patient is discharged from hospital.  The above assessment, treatment plan, treatment alternatives and goals were discussed and mutually agreed upon: by patient and by family  [name] 09/14/2016, 12:33 PM

## 2016-09-15 ENCOUNTER — Inpatient Hospital Stay (HOSPITAL_COMMUNITY): Payer: Medicare Other | Admitting: Physical Therapy

## 2016-09-15 ENCOUNTER — Inpatient Hospital Stay (HOSPITAL_COMMUNITY): Payer: Medicare Other | Admitting: Occupational Therapy

## 2016-09-15 ENCOUNTER — Inpatient Hospital Stay (HOSPITAL_COMMUNITY): Payer: Medicare Other | Admitting: Speech Pathology

## 2016-09-15 MED ORDER — ENSURE ENLIVE PO LIQD
237.0000 mL | Freq: Every day | ORAL | Status: DC
  Administered 2016-09-15 – 2016-09-19 (×5): 237 mL via ORAL

## 2016-09-15 NOTE — Progress Notes (Signed)
Physical Therapy Session Note  Patient Details  Name: Kristin Shaffer MRN: [id number] Date of Birth: May 19, 1931  Today's Date: 09/15/2016 PT Individual Time: 0900-1000 and 1400-1430 PT Individual Time Calculation (min): 60 min and 30 min   Short Term Goals: Week 1:  PT Short Term Goal 1 (Week 1): = LTGs due to anticpated LOS  Skilled Therapeutic Interventions/Progress Updates:    Treatment 1: Patient in recliner upon arrival with son present in room. Gait training using RW to and from therapy gym with supervision and patient's son reporting that patient's gait appears more symmetrical and fluid than baseline PTA. Sit <> stand transfer training from mat table without UE support 2 x 5 with patient demonstrating B hip adduction to achieve standing and decreased eccentric control on descent. In standing patient completed 2 3D pipetree puzzles to address problem solving, standing balance during bimanual task, and standing tolerance x 6 min (remainder of pipetree completed seated EOM) with mod cues for error recognition and correction. Instructed in [place] HEP for generalized strengthening and falls prevention using RW for UE support in standing: seated LAQ x 20 each LE, marching x 40, heel raises x 20, knee flexion x 20 each LE, squats to fatigue. Stair training up/down 12 stairs using 2 rails with supervision. Performed NuStep using BUE/BLE at level 5 x 8 min for strengthening and muscular endurance. Patient required verbal cues for safe hand placement with sit <> stand using RW throughout session. Patient left sitting in recliner with all need within reach.   Treatment 2: Patient requesting to toilet, ambulated to and from bathroom and performed toileting using RW with supervision. Session focused on community and outdoor ambulation using RW throughout hospital, on/off elevators, over thresholds, up/down 8 outdoor steps using 1 rail with cues to wait for PT to take RW to top/bottom of steps, on brick and  concrete surfaces, and inclines/declines with supervision including 2 seated rest breaks with supervision and verbal cues for hand placement for sit <> stand to low bench. Patient left sitting in recliner with needs in reach.   Therapy Documentation Precautions:  Precautions Precautions: Fall Precaution Comments: hx of syncope Restrictions Weight Bearing Restrictions: No Pain: Pain Assessment Pain Assessment: No/denies pain Pain Score: 0-No pain   See Function Navigator for Current Functional Status.   Therapy/Group: Individual Therapy  [name] 09/15/2016, 9:52 AM

## 2016-09-15 NOTE — Progress Notes (Signed)
Speech Language Pathology Daily Session Note  Patient Details  Name: Kristin Shaffer MRN: [id number] Date of Birth: December 21, 1930  Today's Date: 09/15/2016 SLP Individual Time: 1000-1100 SLP Individual Time Calculation (min): 60 min   Short Term Goals: Week 1: SLP Short Term Goal 1 (Week 1): STG=LTG due to ELOS   Skilled Therapeutic Interventions:Pt seen for skilled SLP treatment targeting higher level fuctional math word problems pertaining to time and money.Pt required min- mod A as mental fatigue evolved. Discussed presence of mental fatigue and neccessity for rest breaks with pt and son Bruce.  Both parties verbalized understanding.    Function:  Eating Eating                 Cognition Comprehension Comprehension assist level: Follows basic conversation/direction with no assist  Expression   Expression assist level: Expresses basic needs/ideas: With no assist  Social Interaction Social Interaction assist level: Interacts appropriately 90% of the time - Needs monitoring or encouragement for participation or interaction.  Problem Solving Problem solving assist level: Solves basic 75 - 89% of the time/requires cueing 10 - 24% of the time  Memory Memory assist level: Recognizes or recalls 75 - 89% of the time/requires cueing 10 - 24% of the time    Pain Pain Assessment Pain Assessment: No/denies pain Pain Score: 0-No pain  Therapy/Group: Individual Therapy  Vinetta Bergamo MA, CCC-SLP 09/15/2016, 11:52 AM

## 2016-09-15 NOTE — Progress Notes (Signed)
Subjective/Complaints: Poor appetite  ROS No N/V/D, no facial pain or HA, no CP or SOB Objective: Vital Signs: Blood pressure (!) 147/52, pulse 62, temperature 97 F (36.1 C), temperature source Oral, resp. rate 18, SpO2 95 %. No results found. Results for orders placed or performed during the hospital encounter of 09/13/16 (from the past 72 hour(s))  CBC WITH DIFFERENTIAL     Status: Abnormal   Collection Time: 09/14/16  5:57 AM  Result Value Ref Range   WBC 8.0 4.0 - 10.5 K/uL   RBC 3.99 3.87 - 5.11 MIL/uL   Hemoglobin 12.1 12.0 - 15.0 g/dL   HCT 35.3 (L) 36.0 - 46.0 %   MCV 88.5 78.0 - 100.0 fL   MCH 30.3 26.0 - 34.0 pg   MCHC 34.3 30.0 - 36.0 g/dL   RDW 13.5 11.5 - 15.5 %   Platelets 238 150 - 400 K/uL   Neutrophils Relative % 69 %   Neutro Abs 5.5 1.7 - 7.7 K/uL   Lymphocytes Relative 17 %   Lymphs Abs 1.3 0.7 - 4.0 K/uL   Monocytes Relative 11 %   Monocytes Absolute 0.9 0.1 - 1.0 K/uL   Eosinophils Relative 2 %   Eosinophils Absolute 0.2 0.0 - 0.7 K/uL   Basophils Relative 1 %   Basophils Absolute 0.0 0.0 - 0.1 K/uL  Comprehensive metabolic panel     Status: Abnormal   Collection Time: 09/14/16  5:57 AM  Result Value Ref Range   Sodium 137 135 - 145 mmol/L   Potassium 3.8 3.5 - 5.1 mmol/L   Chloride 106 101 - 111 mmol/L   CO2 25 22 - 32 mmol/L   Glucose, Bld 119 (H) 65 - 99 mg/dL   BUN 17 6 - 20 mg/dL   Creatinine, Ser 0.79 0.44 - 1.00 mg/dL   Calcium 8.5 (L) 8.9 - 10.3 mg/dL   Total Protein 6.4 (L) 6.5 - 8.1 g/dL   Albumin 3.4 (L) 3.5 - 5.0 g/dL   AST 48 (H) 15 - 41 U/L   ALT 20 14 - 54 U/L   Alkaline Phosphatase 77 38 - 126 U/L   Total Bilirubin 1.1 0.3 - 1.2 mg/dL   GFR calc non Af Amer >60 >60 mL/min   GFR calc Af Amer >60 >60 mL/min    Comment: (NOTE) The eGFR has been calculated using the CKD EPI equation. This calculation has not been validated in all clinical situations. eGFR's persistently <60 mL/min signify possible Chronic Kidney Disease.     Anion gap 6 5 - 15     HEENT: periorbital bruising Cardio: RRR and no murmur Resp: CTA B/L and unlabored GI: BS positive and NT, ND Extremity:  BS positive and NT, ND Skin:   Bruise maxillary Neuro: Alert/Oriented, Normal Sensory and Abnormal Motor 4/5 in BUE and BLE Musc/Skel:  Normal Gen NAD   Assessment/Plan: 1. Functional deficits secondary to TBI/SDH/SAH which require 3+ hours per day of interdisciplinary therapy in a comprehensive inpatient rehab setting. Physiatrist is providing close team supervision and 24 hour management of active medical problems listed below. Physiatrist and rehab team continue to assess barriers to discharge/monitor patient progress toward functional and medical goals. FIM: Function - Bathing Position: Shower Body parts bathed by patient: Right arm, Left arm, Chest, Abdomen, Front perineal area, Buttocks, Right upper leg, Left upper leg Body parts bathed by helper: Right lower leg, Left lower leg, Back Assist Level: Touching or steadying assistance(Pt > 75%)  Function- Upper Body  Dressing/Undressing What is the patient wearing?: Bra, Pull over shirt/dress Bra - Perfomed by patient: Thread/unthread right bra strap, Thread/unthread left bra strap Bra - Perfomed by helper: Hook/unhook bra (pull down sports bra) Pull over shirt/dress - Perfomed by patient: Thread/unthread right sleeve, Thread/unthread left sleeve, Put head through opening, Pull shirt over trunk Assist Level: Touching or steadying assistance(Pt > 75%) Function - Lower Body Dressing/Undressing What is the patient wearing?: Pants, Underwear, Socks, Shoes Position: Wheelchair/chair at [employer] - Performed by patient: Pull underwear up/down Underwear - Performed by helper: Thread/unthread right underwear leg, Thread/unthread left underwear leg Pants- Performed by patient: Pull pants up/down Pants- Performed by helper: Thread/unthread right pants leg, Thread/unthread left pants  leg Socks - Performed by helper: Don/doff right sock, Don/doff left sock Shoes - Performed by patient: Don/doff right shoe, Don/doff left shoe Assist for footwear: Partial/moderate assist Assist for lower body dressing: Touching or steadying assistance (Pt > 75%)  Function - Toileting Toileting steps completed by patient: Performs perineal hygiene Toileting steps completed by helper: Adjust clothing prior to toileting, Adjust clothing after toileting Toileting Assistive Devices: Grab bar or rail Assist level: Touching or steadying assistance (Pt.75%)  Function - Air cabin crew transfer assistive device: Grab bar Assist level to toilet: Touching or steadying assistance (Pt > 75%) Assist level from toilet: Touching or steadying assistance (Pt > 75%)  Function - Chair/bed transfer Chair/bed transfer method: Ambulatory Chair/bed transfer assist level: Touching or steadying assistance (Pt > 75%) Chair/bed transfer assistive device: Armrests  Function - Locomotion: Wheelchair Will patient use wheelchair at discharge?: No Function - Locomotion: Ambulation Assistive device: Hand held assist, Walker-rolling Max distance: 150 ft Assist level: Touching or steadying assistance (Pt > 75%) Assist level: Touching or steadying assistance (Pt > 75%) Assist level: Touching or steadying assistance (Pt > 75%) Assist level: Touching or steadying assistance (Pt > 75%) Assist level: Touching or steadying assistance (Pt > 75%)  Function - Comprehension Comprehension: Auditory Comprehension assist level: Understands basic 75 - 89% of the time/ requires cueing 10 - 24% of the time  Function - Expression Expression: Verbal Expression assist level: Expresses basic 75 - 89% of the time/requires cueing 10 - 24% of the time. Needs helper to occlude trach/needs to repeat words.  Function - Social Interaction Social Interaction assist level: Interacts appropriately 75 - 89% of the time - Needs  redirection for appropriate language or to initiate interaction.  Function - Problem Solving Problem solving assist level: Solves basic 75 - 89% of the time/requires cueing 10 - 24% of the time  Function - Memory Memory assist level: Recognizes or recalls 75 - 89% of the time/requires cueing 10 - 24% of the time Patient normally able to recall (first 3 days only): Current season  Medical Problem List and Plan: 1.  Traumatic TBI /SDH/SAH as well as maxillofacial fractures secondary to syncopal event. PT and OT and SLP CIR level 2.  DVT Prophylaxis/Anticoagulation: SCDs. Monitor for any signs of DVT 3. Pain Management: Flexeril as needed for muscle spasms as prior to admission. 4. Seizure prophylaxis. Keppra 500 mg twice a day. EEG negative 5. Neuropsych: This patient is capable of making decisions on her own behalf. 6. Skin/Wound Care: Routine skin checks 7. Fluids/Electrolytes/Nutrition: Routine I&O with Normal f/u chemistries, ask dietary to see for poor appetite 8. CAD/NSTEMI status post stenting. Plan to resume aspirin in 1 week a 81 mg daily and Plavix in 2 weeks. No chest pain or shortness of breath 9. Hypertension. Lisinopril  10 mg daily, Lopressor 12.5 mg twice a day. Monitor with increased mobility, will increase lopressor, HR at goal, consider increase in lisinopril Vitals:   09/14/16 1951 09/15/16 0602  BP: (!) 150/83 (!) 147/52  Pulse: 70 62  Resp:  18  Temp:  97 F (36.1 C)    10. Hypothyroidism. Synthroid. 11. Hyperlipidemia.Zetia/Lipitor   LOS (Days) 2 A FACE TO FACE EVALUATION WAS PERFORMED  Cleve Paolillo E 09/15/2016, 7:02 AM

## 2016-09-15 NOTE — Progress Notes (Signed)
Physical Therapy Session Note  Patient Details  Name: Kristin Shaffer MRN: [id number] Date of Birth: Jan 27, 1931  Today's Date: 09/15/2016 PT Individual Time: 1130-1158 PT Individual Time Calculation (min): 28 min    Short Term Goals: Week 1:  PT Short Term Goal 1 (Week 1): = LTGs due to anticpated LOS  Skilled Therapeutic Interventions/Progress Updates:     Therapy Documentation Precautions:  Precautions Precautions: Fall Precaution Comments: hx of syncope Restrictions Weight Bearing Restrictions: No General:   Vital Signs: Therapy Vitals Temp: 98 F (36.7 C) Temp Source: Oral Pulse Rate: 63 Resp: 18 BP: (!) 111/51 Patient Position (if appropriate): Sitting Oxygen Therapy SpO2: 97 % O2 Device: Not Delivered Pain: Pain Assessment Pain Assessment: No/denies pain  Patient received seated in recliner chair. Patient reports fatigue from previous sessions.   Session focused on sit to and from stand transfer from recliner chair with emphasis on eccentric control from standing position to a seated position. CLose supervision with verbal cues for controlled descent as well as proper sequence and technique. Patient performed 10x.  Patient ambulated 150 feet with close supervision with RW to and from room and therapy gym. Verbal cues for upright posture, environmental awareness and obstacle negotiation.  Patient performed car transfer with min assist. With use of RW. Verbal and tactile cues for proper sequence and technique.   Patient ambulated min assist 15 feet with RW over uneven and unlevel terrain. Max verbal cues for RW management.   Patient returned to room at end of session with all needs sitting in the recliner.    See Function Navigator for Current Functional Status.   Therapy/Group: Individual Therapy  Retta Diones 09/15/2016, 1:46 PM

## 2016-09-15 NOTE — Progress Notes (Signed)
Occupational Therapy Session Note  Patient Details  Name: Kristin Shaffer MRN: [id number] Date of Birth: Jun 04, 1931  Today's Date: 09/15/2016  Session 1 OT Individual Time: 4599-7741 OT Individual Time Calculation (min): 56 min   Session 2 OT Individual Time: 1500-1530 OT Individual Time Calculation (min): 30 min   Short Term Goals: Week 1:  OT Short Term Goal 1 (Week 1): LTG=STG 2/2 estimated short LOS  Skilled Therapeutic Interventions/Progress Updates:    Session 1 1:1 OT session focused on safety awareness during ADL tasks, bathing/dressing, standing balance during grooming tasks, and memory strategies. Pt required mod instructional cues for safety awareness and safe use of RW during B/D session. She required overall Min guard A to ambulate w/ RW into bathroom complete toilet and shower transfers. Pt required VC to locate 1 item on the R side for grooming task, but was able to identify and use objects appropriately. Pt unable to remember OT's name or room number today.Pt left seated in recliner at end of session with needs met.  Session 2 1:1 OT session focused on sit<>stands, dynamic standing balance, and fine motor coordination. Min A overall for sit<>stands with mod instructional cues for safety awareness. Stand-by assist w/ intermittent CGA for dynamic standing activities. 9 hole peg test administered to pt. R hand= 27 seconds. L hand - 19 seconds. Pt with coordination deficits noted on R side. Pt ambulated back to room at end of session with min guard A 2/2 poor walker management.   Therapy Documentation Precautions:  Precautions Precautions: Fall Precaution Comments: hx of syncope Restrictions Weight Bearing Restrictions: No Pain: Pain Assessment Pain Assessment: No/denies pain ADL: ADL ADL Comments: See functional navigator  See Function Navigator for Current Functional Status.   Therapy/Group: Individual Therapy  [name] 09/15/2016, 3:42 PM

## 2016-09-15 NOTE — Progress Notes (Signed)
Initial Nutrition Assessment  DOCUMENTATION CODES:   Not applicable  INTERVENTION:  Provide Ensure Enlive po once daily, each supplement provides 350 kcal and 20 grams of protein.  Encourage adequate PO intake.   NUTRITION DIAGNOSIS:   Increased nutrient needs related to  (acute injury) as evidenced by estimated needs.  GOAL:   Patient will meet greater than or equal to 90% of their needs  MONITOR:   PO intake, Supplement acceptance, Labs, Weight trends, Skin, I & O's  REASON FOR ASSESSMENT:   Consult Assessment of nutrition requirement/status  ASSESSMENT:   82 y.o. right handed female with history of CAD status post NSTEMI with stenting maintained on Plavix and aspirin, hypertension, pulmonary fibrosis. Presented 09/09/2016 after syncopal episode while working at [employer]. Denied any chest pain or shortness of breath. Cranial CT scan showed extensive subarachnoid hemorrhage throughout the right sylvian fissure and lateral left temporal lobe. Hemorrhagic contusion anterior right temporal tip and anterior frontal lobes right greater than left. Large right periorbital hematoma with extensive inflammatory changes. Comminuted anterior maxillary sinus fracture with blood throughout the right maxillary sinus. Minimally displaced frontal process maxillary fracture right nasal bone fracture.  Meal completion has been 75-100%. Pt reports having a decreased appetite since admission. Pt reports eating well PTA with usual consumption of at least 3 meals a day with no other difficulties. Weight has been stable. RD to order Ensure to aid in adequate caloric and protein needs.   Pt with no observed significant fat or muscle mass loss.   Labs and medications reviewed.   Diet Order:  Diet Heart Room service appropriate? Yes; Fluid consistency: Thin  Skin:   (Abrasion on eye, face and neck bruising)  Last BM:  10/18  Height:   Ht Readings from Last 1 Encounters:  09/11/16 5\' 6"   (1.676 m)    Weight:   Wt Readings from Last 1 Encounters:  09/09/16 179 lb (81.2 kg)    Ideal Body Weight:  59 kg  BMI:  There is no height or weight on file to calculate BMI.  Estimated Nutritional Needs:   Kcal:  1750-1900  Protein:  75-85 grams  Fluid:  1.7 - 1.9 L/day  EDUCATION NEEDS:   No education needs identified at this time  Corrin Parker, MS, RD, LDN Pager # [phone] After hours/ weekend pager # [phone]

## 2016-09-15 NOTE — IPOC Note (Signed)
Overall Plan of Care [facility]) Patient Details Name: ANNAKAREN FADLEY MRN: [id number] DOB: December 08, 1930  Admitting Diagnosis: TBI  Hospital Problems: Active Problems:   TBI (traumatic brain injury) [facility])     Functional Problem List: Nursing Skin Integrity, Safety, Endurance, Edema, Behavior, Bowel, Medication Management, Motor, Nutrition, Pain, Sensory  PT Balance, Behavior, Endurance, Motor, Pain, Safety  OT Balance, Cognition, Endurance, Motor, Perception, Safety  SLP Cognition  TR         Basic ADL's: OT Grooming, Bathing, Dressing, Toileting     Advanced  ADL's: OT Simple Meal Preparation, Laundry     Transfers: PT Bed Mobility, Bed to Chair, Musician, Manufacturing systems engineer, Metallurgist: PT Ambulation, Emergency planning/management officer, Stairs     Additional Impairments: OT None  SLP Social Cognition   Problem Solving, Memory, Awareness  TR      Anticipated Outcomes Item Anticipated Outcome  Self Feeding    Swallowing      Basic self-care  Mod I  Toileting  Mod I   Bathroom Transfers Mod I  Bowel/Bladder  Pt will remain continent of bowel and bladder with min assist   Transfers  mod I  Locomotion  mod I household, supervision community  Communication     Cognition  Mod I   Pain  Pt will manage pain at 4 or less on a scale of 0-10.   Safety/Judgment  Pt will remain free of falls and injury with min assist    Therapy Plan: PT Intensity: Minimum of 1-2 x/day ,45 to 90 minutes PT Frequency: 5 out of 7 days PT Duration Estimated Length of Stay: 7-9 days OT Intensity: Minimum of 1-2 x/day, 45 to 90 minutes OT Frequency: 5 out of 7 days OT Duration/Estimated Length of Stay: 7-9 days SLP Intensity: Minumum of 1-2 x/day, 30 to 90 minutes SLP Frequency: 3 to 5 out of 7 days SLP Duration/Estimated Length of Stay: 7 days        Team Interventions: Nursing Interventions Cognitive Remediation/Compensation, Medication Management, Bowel Management, Patient/Family  Education, Disease Management/Prevention, Pain Management, Skin Care/Wound Management, Discharge Planning  PT interventions Ambulation/gait training, Balance/vestibular training, Cognitive remediation/compensation, Community reintegration, Discharge planning, Disease management/prevention, DME/adaptive equipment instruction, Functional mobility training, Neuromuscular re-education, Pain management, Patient/family education, Psychosocial support, Stair training, Therapeutic Activities, Therapeutic Exercise, UE/LE Strength taining/ROM, UE/LE Coordination activities  OT Interventions Training and development officer, Cognitive remediation/compensation, Community reintegration, Discharge planning, DME/adaptive equipment instruction, Functional mobility training, Patient/family education, Self Care/advanced ADL retraining, Therapeutic Activities, Therapeutic Exercise, UE/LE Strength taining/ROM, UE/LE Coordination activities, Visual/perceptual remediation/compensation  SLP Interventions Cognitive remediation/compensation, Cueing hierarchy, Functional tasks, Internal/external aids, Patient/family education  TR Interventions    SW/CM Interventions Discharge Planning, Psychosocial Support, Patient/Family Education    Team Discharge Planning: Destination: PT-Home ,OT- Home , SLP-Home Projected Follow-up: PT-Home health PT, OT-  Home health OT, SLP-Home Health SLP, Outpatient SLP, 24 hour supervision/assistance Projected Equipment Needs: PT-None recommended by PT, OT- To be determined, SLP-None recommended by SLP Equipment Details: PT-patient owns RW and quad cane, OT-  Patient/family involved in discharge planning: PT- Patient, Family Midwife,  OT-Patient, Family member/caregiver, SLP-Patient, Family member/caregiver  MD ELOS: 5-7 days. Medical Rehab Prognosis:  Good Assessment:  82 y.o.right handed femalewith history of CAD status post NSTEMIwith stenting maintained on Plavix and aspirin,  hypertension, pulmonary fibrosis.Per chart review patient lives alone in [place]. Independent with occasional cane and still driving.  Presented 09/09/2016 after syncopal episode while working at [employer]. Denied any chest  pain or shortness of breath. Cranial CT scan showed extensive subarachnoid hemorrhage throughout the right sylvian fissure and lateral left temporal lobe. Hemorrhagic contusion anterior right temporal tip and anterior frontal lobes right greater than left. Large right periorbital hematoma with extensive inflammatory changes. Comminuted anterior maxillary sinus fracture with blood throughout the right maxillary sinus. Minimally displaced frontal process maxillary fracture right nasal bone fracture. CT cervical spine with no acute abnormalities. Troponin negative. Neurosurgery Dr. Gerrit Halls care. Advised to resume aspirin in 1 week and Plavix in 2 weeks. ENT, Dr. Wilburn Cornelia also advising conservative care of multiple facial fractures. Echocardiogram for workup of syncope showed ejection fraction of 70% no wall motion abnormalities. EEG showed severe diffuse cerebral dysfunction no seizure activity. Maintained on Keppra for seizure prophylaxis. Pt with resulting functional deficits balance, mobility, cognition.  Will set goals for Mod I with PT/OT/SLP.    See Team Conference Notes for weekly updates to the plan of care

## 2016-09-16 ENCOUNTER — Inpatient Hospital Stay (HOSPITAL_COMMUNITY): Payer: Medicare Other | Admitting: Speech Pathology

## 2016-09-16 DIAGNOSIS — I10 Essential (primary) hypertension: Secondary | ICD-10-CM

## 2016-09-16 DIAGNOSIS — I25810 Atherosclerosis of coronary artery bypass graft(s) without angina pectoris: Secondary | ICD-10-CM

## 2016-09-16 DIAGNOSIS — R0989 Other specified symptoms and signs involving the circulatory and respiratory systems: Secondary | ICD-10-CM

## 2016-09-16 NOTE — Progress Notes (Signed)
Pt diastolic BPs low: [id number] (R arm) at 23:58; 144/50 (L arm) at 24:00. On-call MD contacted. On-call MD gave verbal order to hold scheduled BP medication. Will continue to monitor. Annita Brod, RN 09/16/2016 12:30 AM

## 2016-09-16 NOTE — Progress Notes (Signed)
Speech Language Pathology Daily Session Note  Patient Details  Name: Kristin Shaffer MRN: [id number] Date of Birth: 12/15/1930  Today's Date: 09/16/2016 SLP Individual Time: 1045-1130 SLP Individual Time Calculation (min): 45 min   Short Term Goals: Week 1: SLP Short Term Goal 1 (Week 1): STG=LTG due to ELOS   Skilled Therapeutic Interventions: Skilled treatment session focused on addressing cognition goals. SLP facilitated session by providing Supervision level verbal cues to recall names, functions, and frequencies of current medications.  Patient with Mod I recall of pre-hospital list.  Patient then complete a medication management task with initial Supervision level verbal cues faded to Mod I by end of session for accuracy of self-monitoring and correcting while loading a two time a day medication box.  Continue with current plan of care.     Function:  Cognition Comprehension Comprehension assist level: Follows basic conversation/direction with no assist  Expression   Expression assist level: Expresses basic needs/ideas: With extra time/assistive device  Social Interaction Social Interaction assist level: Interacts appropriately 90% of the time - Needs monitoring or encouragement for participation or interaction.  Problem Solving Problem solving assist level: Solves basic 90% of the time/requires cueing < 10% of the time  Memory Memory assist level: Recognizes or recalls 90% of the time/requires cueing < 10% of the time    Pain Pain Assessment Pain Assessment: No/denies pain  Therapy/Group: Individual Therapy  Carmelia Roller., [place] [id number]  [place] 09/16/2016, 11:24 AM

## 2016-09-16 NOTE — Progress Notes (Signed)
Subjective/Complaints: Pt laying in bed this AM.  She believes she is well enough to go home.    ROS No N/V/D, no facial pain or HA, no CP or SOB  Objective: Vital Signs: Blood pressure (!) 141/62, pulse 64, temperature 97.8 F (36.6 C), temperature source Oral, resp. rate 18, SpO2 98 %. No results found. Results for orders placed or performed during the hospital encounter of 09/13/16 (from the past 72 hour(s))  CBC WITH DIFFERENTIAL     Status: Abnormal   Collection Time: 09/14/16  5:57 AM  Result Value Ref Range   WBC 8.0 4.0 - 10.5 K/uL   RBC 3.99 3.87 - 5.11 MIL/uL   Hemoglobin 12.1 12.0 - 15.0 g/dL   HCT 35.3 (L) 36.0 - 46.0 %   MCV 88.5 78.0 - 100.0 fL   MCH 30.3 26.0 - 34.0 pg   MCHC 34.3 30.0 - 36.0 g/dL   RDW 13.5 11.5 - 15.5 %   Platelets 238 150 - 400 K/uL   Neutrophils Relative % 69 %   Neutro Abs 5.5 1.7 - 7.7 K/uL   Lymphocytes Relative 17 %   Lymphs Abs 1.3 0.7 - 4.0 K/uL   Monocytes Relative 11 %   Monocytes Absolute 0.9 0.1 - 1.0 K/uL   Eosinophils Relative 2 %   Eosinophils Absolute 0.2 0.0 - 0.7 K/uL   Basophils Relative 1 %   Basophils Absolute 0.0 0.0 - 0.1 K/uL  Comprehensive metabolic panel     Status: Abnormal   Collection Time: 09/14/16  5:57 AM  Result Value Ref Range   Sodium 137 135 - 145 mmol/L   Potassium 3.8 3.5 - 5.1 mmol/L   Chloride 106 101 - 111 mmol/L   CO2 25 22 - 32 mmol/L   Glucose, Bld 119 (H) 65 - 99 mg/dL   BUN 17 6 - 20 mg/dL   Creatinine, Ser 0.79 0.44 - 1.00 mg/dL   Calcium 8.5 (L) 8.9 - 10.3 mg/dL   Total Protein 6.4 (L) 6.5 - 8.1 g/dL   Albumin 3.4 (L) 3.5 - 5.0 g/dL   AST 48 (H) 15 - 41 U/L   ALT 20 14 - 54 U/L   Alkaline Phosphatase 77 38 - 126 U/L   Total Bilirubin 1.1 0.3 - 1.2 mg/dL   GFR calc non Af Amer >60 >60 mL/min   GFR calc Af Amer >60 >60 mL/min    Comment: (NOTE) The eGFR has been calculated using the CKD EPI equation. This calculation has not been validated in all clinical situations. eGFR's  persistently <60 mL/min signify possible Chronic Kidney Disease.    Anion gap 6 5 - 15     HEENT: periorbital bruising and edema.  Cardio: RRR and no murmur Resp: CTA B/L and unlabored GI: BS positive and NT, ND Skin:   Bruise maxillary. Warm and dry.  Neuro: Alert/Oriented Motor: 4-4+/5 in BUE and BLE grossly Musc/Skel:  No edema, no tenderness in extremities. Gen NAD. Vital signs reviewed.    Assessment/Plan: 1. Functional deficits secondary to TBI/SDH/SAH which require 3+ hours per day of interdisciplinary therapy in a comprehensive inpatient rehab setting. Physiatrist is providing close team supervision and 24 hour management of active medical problems listed below. Physiatrist and rehab team continue to assess barriers to discharge/monitor patient progress toward functional and medical goals. FIM: Function - Bathing Position: Shower Body parts bathed by patient: Right arm, Left arm, Chest, Abdomen, Front perineal area, Buttocks, Right upper leg, Left upper leg, Right  lower leg, Left lower leg Body parts bathed by helper: Back Assist Level: Touching or steadying assistance(Pt > 75%)  Function- Upper Body Dressing/Undressing What is the patient wearing?: Bra, Pull over shirt/dress Bra - Perfomed by patient: Thread/unthread right bra strap, Hook/unhook bra (pull down sports bra), Thread/unthread left bra strap Bra - Perfomed by helper: Hook/unhook bra (pull down sports bra) Pull over shirt/dress - Perfomed by patient: Thread/unthread right sleeve, Thread/unthread left sleeve, Put head through opening, Pull shirt over trunk Assist Level: Supervision or verbal cues, Set up Set up : To obtain clothing/put away Function - Lower Body Dressing/Undressing What is the patient wearing?: Pants, Underwear, Socks, Shoes Position: Wheelchair/chair at sink Underwear - Performed by patient: Thread/unthread right underwear leg, Thread/unthread left underwear leg, Pull underwear  up/down Underwear - Performed by helper: Thread/unthread right underwear leg, Thread/unthread left underwear leg Pants- Performed by patient: Thread/unthread left pants leg, Thread/unthread right pants leg, Pull pants up/down Pants- Performed by helper: Thread/unthread right pants leg, Thread/unthread left pants leg Socks - Performed by helper: Don/doff right sock, Don/doff left sock Shoes - Performed by patient: Don/doff right shoe, Don/doff left shoe Assist for footwear: Partial/moderate assist Assist for lower body dressing: Touching or steadying assistance (Pt > 75%)  Function - Toileting Toileting steps completed by patient: Adjust clothing prior to toileting, Performs perineal hygiene, Adjust clothing after toileting Toileting steps completed by helper: Adjust clothing prior to toileting, Adjust clothing after toileting Toileting Assistive Devices: Grab bar or rail Assist level: Touching or steadying assistance (Pt.75%)  Function - Air cabin crew transfer assistive device: Grab bar, Walker Assist level to toilet: Touching or steadying assistance (Pt > 75%) Assist level from toilet: Touching or steadying assistance (Pt > 75%)  Function - Chair/bed transfer Chair/bed transfer method: Ambulatory Chair/bed transfer assist level: Supervision or verbal cues Chair/bed transfer assistive device: Armrests, Printmaker - Locomotion: Wheelchair Will patient use wheelchair at discharge?: No Function - Locomotion: Ambulation Assistive device: Walker-rolling Max distance: 150 ft Assist level: Supervision or verbal cues Assist level: Supervision or verbal cues Assist level: Supervision or verbal cues Assist level: Supervision or verbal cues Assist level: Touching or steadying assistance (Pt > 75%)  Function - Comprehension Comprehension: Auditory Comprehension assist level: Follows basic conversation/direction with no assist  Function - Expression Expression:  Verbal Expression assist level: Expresses basic needs/ideas: With no assist  Function - Social Interaction Social Interaction assist level: Interacts appropriately 90% of the time - Needs monitoring or encouragement for participation or interaction.  Function - Problem Solving Problem solving assist level: Solves basic 75 - 89% of the time/requires cueing 10 - 24% of the time  Function - Memory Memory assist level: Recognizes or recalls 75 - 89% of the time/requires cueing 10 - 24% of the time Patient normally able to recall (first 3 days only): Current season, That he or she is in a hospital  Medical Problem List and Plan: 1.  Traumatic TBI /SDH/SAH as well as maxillofacial fractures secondary to syncopal event.  Cont CIR  2.  DVT Prophylaxis/Anticoagulation: SCDs. Monitor for any signs of DVT 3. Pain Management: Flexeril as needed for muscle spasms as prior to admission. 4. Seizure prophylaxis. Keppra 500 mg twice a day. EEG negative 5. Neuropsych: This patient is capable of making decisions on her own behalf. 6. Skin/Wound Care: Routine skin checks 7. Fluids/Electrolytes/Nutrition: Routine I&Os   Dietitian consult 8. CAD/NSTEMI status post stenting.   Plan to resume aspirin in 1 week (~Monday) at 81 mg  daily and Plavix in 2 weeks.   No chest pain or shortness of breath 9. Hypertension.   Lisinopril 10 mg daily  Lopressor 12.5 mg twice a day, increased to 25. Monitor with increased mobility  Slightly labile at present - metoprolol held overnight Vitals:   09/16/16 0000 09/16/16 0618  BP: (!) 144/50 (!) 141/62  Pulse: 68 64  Resp:  18  Temp:  97.8 F (36.6 C)  10. Hypothyroidism. Synthroid. 11. Hyperlipidemia.Zetia/Lipitor   LOS (Days) 3 A FACE TO FACE EVALUATION WAS PERFORMED  Tecia Cinnamon Lorie Phenix 09/16/2016, 8:29 AM

## 2016-09-17 ENCOUNTER — Inpatient Hospital Stay (HOSPITAL_COMMUNITY): Payer: Medicare Other | Admitting: Physical Therapy

## 2016-09-17 ENCOUNTER — Inpatient Hospital Stay (HOSPITAL_COMMUNITY): Payer: Medicare Other | Admitting: Occupational Therapy

## 2016-09-17 ENCOUNTER — Inpatient Hospital Stay (HOSPITAL_COMMUNITY): Payer: Medicare Other

## 2016-09-17 NOTE — Progress Notes (Signed)
Occupational Therapy Session Note  Patient Details  Name: Kristin Shaffer MRN: [id number] Date of Birth: 04-Oct-1931  Today's Date: 09/17/2016 OT Individual Time: 0900-1000 OT Individual Time Calculation (min): 60 min   Short Term Goals: Week 1:  OT Short Term Goal 1 (Week 1): LTG=STG 2/2 estimated short LOS  Skilled Therapeutic Interventions/Progress Updates:   ADL-retraining with focus on improved awareness and attention, transfers, dynamic standing balance, and adapted bathing/dressing skills.  Pt received supine in bed, poorly positioned with knees flexed and patient at fully supine with HOB elevated.  Pt's son was present in room but left as session initiated.   Pt receptive for bathing/dressing although minimally verbal with therapist with incomplete responses due to hard of hearing.  Pt required mod assist to rise to EOB but adjusted herself quickly and maintained sitting balance unassisted.  With setup to provide walker, pt ambulated to bathroom and disrobed after transfer to shower chair.   Pt required re-ed on use of hand shower and intermittent assist to problem-solve use of hand shower but she progressed through bathing unassisted (except for thoroughness to wash her back), deferring assist for lower legs.   With contact guard to ambulate to edge of bed, pt dressed with only steadying assist while standing to pull up her underwear and pants.   Pt groomed standing at sink with setup, extra time and contact guard; min assist to brush the back of her head.   Pt returned to recliner at end of session with QRB applied and all needs within reach.  Per dw P.T., pt's presentation was not typical during session as she has been more alert and verbal previously; her expressions were often ambiguous with intermittent laughing unrelated to topic.  Therapy Documentation Precautions:  Precautions Precautions: Fall Precaution Comments: hx of syncope Restrictions Weight Bearing Restrictions:  No  Vital Signs: Therapy Vitals Temp: 97.9 F (36.6 C) Temp Source: Oral Pulse Rate: 67 Resp: 17 BP: (!) 149/53 Patient Position (if appropriate): Lying Oxygen Therapy SpO2: 100 % O2 Device: Not Delivered  Pain: Pain Assessment Pain Assessment: No/denies pain  ADL: ADL ADL Comments: See functional navigator  See Function Navigator for Current Functional Status.   Therapy/Group: Individual Therapy  Kushal Saunders 09/17/2016, 9:58 AM

## 2016-09-17 NOTE — Progress Notes (Addendum)
Physical Therapy Session Note  Patient Details  Name: Kristin Shaffer MRN: [id number] Date of Birth: November 30, 1930  Today's Date: 09/17/2016 PT Individual Time: [id number] PT Individual Time Calculation (min): 27 min    Short Term Goals: Week 1:  PT Short Term Goal 1 (Week 1): = LTGs due to anticpated LOS  Skilled Therapeutic Interventions/Progress Updates:    Pt received in w/c & agreeable to PT. Pt denied c/o pain. Gait training x 75 ft + 150 ft with RW & supervision with cuing for obstacle avoidance on R. Stair training completed x 4 steps + 4 steps (6") with R ascending rail and min assist. Pt required max cuing on initial trial to turn around and ascend 6" steps forwards (pt attempted to ascend 3" steps then ascend 6" steps backwards). Pt required max cuing to [employer] R ascending rail to simulate home environment. Pt performed sit<>stand from low mat table with min assist and max cuing focusing on BLE strengthening sit>stand and eccentric control stand>sit. At end of session pt left sitting in recliner with QRB in place, all needs within reach & family present.  RN notified of pt's difficulty avoiding obstacles on R during ambulation on this date.   Therapy Documentation Precautions:  Precautions Precautions: Fall Precaution Comments: hx of syncope Restrictions Weight Bearing Restrictions: No   See Function Navigator for Current Functional Status.   Therapy/Group: Individual Therapy  Waunita Schooner 09/17/2016, 1:36 PM

## 2016-09-17 NOTE — Progress Notes (Signed)
Occupational Therapy Session Note  Patient Details  Name: REBBECA CROCCO MRN: [id number] Date of Birth: 1931/06/10  Today's Date: 09/17/2016 OT Individual Time: 1333-1415 OT Individual Time Calculation (min): 42 min   Short Term Goals: Week 1:  OT Short Term Goal 1 (Week 1): LTG=STG 2/2 estimated short LOS Week 2:     Skilled Therapeutic Interventions/Progress Updates:   Pt participated in skilled OT session focusing on kitchen mobility, walker safety, and cognition. Pt was in recliner with family present at time of arrival and agreeable to complete simple meal prep task. Pt ambulated to kitchen with steady assist for RW mgt. Pt required max vcs for sequencing, problem solving, and placing walker in safe positions. Pts sister Karna Christmas was present and educated on proper kitchen modifications to maximize safety at home. Pt participated in washing dishes and countertop. Pt frequently dropped items with right hand and required cues to not bend down to retrieve them. At end of tx pt was returned to room and transferred back to recliner with safety belt and family present.   Therapy Documentation Precautions:  Precautions Precautions: Fall Precaution Comments: hx of syncope Restrictions Weight Bearing Restrictions: No   Pain: No c/o pain during session    ADL: ADL ADL Comments: See functional navigator    See Function Navigator for Current Functional Status.   Therapy/Group: Individual Therapy  Georgean Spainhower A [place] 09/17/2016, 7:44 PM

## 2016-09-17 NOTE — Progress Notes (Signed)
Subjective/Complaints: Pt sitting up in bed.  She does not have any complaints and is smiling.  ROS: No N/V/D, no facial pain or HA, no CP or SOB  Objective: Vital Signs: Blood pressure (!) 149/53, pulse 67, temperature 97.9 F (36.6 C), temperature source Oral, resp. rate 17, SpO2 100 %. No results found. No results found for this or any previous visit (from the past 72 hour(s)).   HEENT: periorbital bruising and edema.  Cardio: RRR and no murmur Resp: CTA B/L and unlabored GI: BS positive and NT, ND Skin:   Bruise maxillary. Warm and dry.  Neuro: Alert/Oriented Motor: 4-4+/5 in BUE and BLE grossly Musc/Skel:  No edema, no tenderness in extremities. Gen NAD. Vital signs reviewed.    Assessment/Plan: 1. Functional deficits secondary to TBI/SDH/SAH which require 3+ hours per day of interdisciplinary therapy in a comprehensive inpatient rehab setting. Physiatrist is providing close team supervision and 24 hour management of active medical problems listed below. Physiatrist and rehab team continue to assess barriers to discharge/monitor patient progress toward functional and medical goals. FIM: Function - Bathing Position: Shower Body parts bathed by patient: Right arm, Left arm, Chest, Abdomen, Front perineal area, Buttocks, Right upper leg, Left upper leg, Right lower leg, Left lower leg Body parts bathed by helper: Back Assist Level: Touching or steadying assistance(Pt > 75%)  Function- Upper Body Dressing/Undressing What is the patient wearing?: Bra, Pull over shirt/dress Bra - Perfomed by patient: Thread/unthread right bra strap, Hook/unhook bra (pull down sports bra), Thread/unthread left bra strap Bra - Perfomed by helper: Hook/unhook bra (pull down sports bra) Pull over shirt/dress - Perfomed by patient: Thread/unthread right sleeve, Thread/unthread left sleeve, Put head through opening, Pull shirt over trunk Assist Level: Supervision or verbal cues, Set up Set up :  To obtain clothing/put away Function - Lower Body Dressing/Undressing What is the patient wearing?: Pants, Underwear, Socks, Shoes Position: Wheelchair/chair at sink Underwear - Performed by patient: Thread/unthread right underwear leg, Thread/unthread left underwear leg, Pull underwear up/down Underwear - Performed by helper: Thread/unthread right underwear leg, Thread/unthread left underwear leg Pants- Performed by patient: Thread/unthread left pants leg, Thread/unthread right pants leg, Pull pants up/down Pants- Performed by helper: Thread/unthread right pants leg, Thread/unthread left pants leg Socks - Performed by helper: Don/doff right sock, Don/doff left sock Shoes - Performed by patient: Don/doff right shoe, Don/doff left shoe Assist for footwear: Partial/moderate assist Assist for lower body dressing: Touching or steadying assistance (Pt > 75%)  Function - Toileting Toileting steps completed by patient: Performs perineal hygiene Toileting steps completed by helper: Adjust clothing prior to toileting, Adjust clothing after toileting Toileting Assistive Devices: Grab bar or rail Assist level: Touching or steadying assistance (Pt.75%)  Function - Air cabin crew transfer assistive device: Grab bar, Walker Assist level to toilet: Touching or steadying assistance (Pt > 75%) Assist level from toilet: Touching or steadying assistance (Pt > 75%)  Function - Chair/bed transfer Chair/bed transfer method: Ambulatory Chair/bed transfer assist level: Supervision or verbal cues Chair/bed transfer assistive device: Armrests, Printmaker - Locomotion: Wheelchair Will patient use wheelchair at discharge?: No Function - Locomotion: Ambulation Assistive device: Walker-rolling Max distance: 150 ft Assist level: Supervision or verbal cues Assist level: Supervision or verbal cues Assist level: Supervision or verbal cues Assist level: Supervision or verbal cues Assist level:  Touching or steadying assistance (Pt > 75%)  Function - Comprehension Comprehension: Auditory Comprehension assist level: Understands basic 75 - 89% of the time/ requires cueing 10 -  24% of the time  Function - Expression Expression: Verbal Expression assist level: Expresses basic 75 - 89% of the time/requires cueing 10 - 24% of the time. Needs helper to occlude trach/needs to repeat words.  Function - Social Interaction Social Interaction assist level: Interacts appropriately 75 - 89% of the time - Needs redirection for appropriate language or to initiate interaction.  Function - Problem Solving Problem solving assist level: Solves basic 75 - 89% of the time/requires cueing 10 - 24% of the time  Function - Memory Memory assist level: Recognizes or recalls 75 - 89% of the time/requires cueing 10 - 24% of the time Patient normally able to recall (first 3 days only): Current season  Medical Problem List and Plan: 1.  Traumatic TBI /SDH/SAH as well as maxillofacial fractures secondary to syncopal event.  Cont CIR  2.  DVT Prophylaxis/Anticoagulation: SCDs. Monitor for any signs of DVT 3. Pain Management: Flexeril as needed for muscle spasms as prior to admission. 4. Seizure prophylaxis. Keppra 500 mg twice a day. EEG negative 5. Neuropsych: This patient is capable of making decisions on her own behalf. 6. Skin/Wound Care: Routine skin checks 7. Fluids/Electrolytes/Nutrition: Routine I&Os   Dietitian consult 8. CAD/NSTEMI status post stenting.   Plan to resume aspirin in 1 week (~Monday) at 81 mg daily and Plavix in 2 weeks.   No chest pain or shortness of breath 9. Hypertension.   Lisinopril 10 mg daily  Lopressor 12.5 mg twice a day, increased to 25. Monitor with increased mobility  Fairly well controlled at present Vitals:   09/16/16 2117 09/17/16 0610  BP: 136/60 (!) 149/53  Pulse: 64 67  Resp:  17  Temp:  97.9 F (36.6 C)  10. Hypothyroidism. Synthroid. 11.  Hyperlipidemia.Zetia/Lipitor   LOS (Days) 4 A FACE TO FACE EVALUATION WAS PERFORMED  Ankit Lorie Phenix 09/17/2016, 8:12 AM

## 2016-09-17 NOTE — Progress Notes (Signed)
Physical Therapy Session Note  Patient Details  Name: Kristin Shaffer MRN: [id number] Date of Birth: Jan 15, 1931  Today's Date: 09/17/2016 PT ConcurrentTime: 1100-1200  60 min  Short Term Goals: Week 1:  PT Short Term Goal 1 (Week 1): = LTGs due to anticpated LOS  Skilled Therapeutic Interventions/Progress Updates:    Patient asleep in recliner with son Bruce in room. Patient requesting to toilet, ambulated to and from bathroom using RW with supervision and performed hand hygiene at sink. Session focused on gait training in controlled and community/outdoor environments using RW on rehab unit, on/off elevators, over thresholds, up/down curb and on uneven brick and concrete surfaces with supervision requiring 2 seated rest breaks. Patient required mod verbal cues for safe use of RW throughout session and max verbal cues for safe hand placement with sit <> stand transfers using RW and multiple attempts to stand from lower surfaces. Patient also noted to have some difficulty placing/keeping RUE on RW hand grip. Performed NuStep using BUE/BLE at level 5 x 10 min for strengthening and endurance. Engaged in 2 rounds of horseshoes while standing on foam pad with single UE support on RW for dynamic balance training and retrieved horseshoes from ground with use of reacher. Patient left sitting in recliner with needs in reach and son in room. Patient demonstrated increased difficulty with communication and delayed processing compared to the last time she was treated by this clinician, repeating one word answers and unable to answer questions with a complete sentence as well as difficulty with RUE on RW, Dr. Posey Pronto, RN Chelsea, and patient's son notified of changes.   Therapy Documentation Precautions:  Precautions Precautions: Fall Precaution Comments: hx of syncope Restrictions Weight Bearing Restrictions: No Pain: Pain Assessment Pain Assessment: Faces Faces Pain Scale: No hurt  See Function  Navigator for Current Functional Status.   Therapy/Group: Individual Therapy  [name] 09/17/2016, 12:25 PM

## 2016-09-18 ENCOUNTER — Inpatient Hospital Stay (HOSPITAL_COMMUNITY): Payer: Medicare Other | Admitting: Occupational Therapy

## 2016-09-18 ENCOUNTER — Inpatient Hospital Stay (HOSPITAL_COMMUNITY): Payer: Medicare Other | Admitting: Physical Therapy

## 2016-09-18 ENCOUNTER — Inpatient Hospital Stay (HOSPITAL_COMMUNITY): Payer: Medicare Other | Admitting: Speech Pathology

## 2016-09-18 DIAGNOSIS — S069X0S Unspecified intracranial injury without loss of consciousness, sequela: Secondary | ICD-10-CM

## 2016-09-18 DIAGNOSIS — G3189 Other specified degenerative diseases of nervous system: Secondary | ICD-10-CM

## 2016-09-18 DIAGNOSIS — F09 Unspecified mental disorder due to known physiological condition: Secondary | ICD-10-CM

## 2016-09-18 DIAGNOSIS — G479 Sleep disorder, unspecified: Secondary | ICD-10-CM

## 2016-09-18 MED ORDER — LEVETIRACETAM 250 MG PO TABS
250.0000 mg | ORAL_TABLET | Freq: Two times a day (BID) | ORAL | Status: DC
  Administered 2016-09-18 – 2016-09-25 (×14): 250 mg via ORAL
  Filled 2016-09-18 (×14): qty 1

## 2016-09-18 MED ORDER — TRAZODONE HCL 50 MG PO TABS
50.0000 mg | ORAL_TABLET | Freq: Every day | ORAL | Status: DC
  Administered 2016-09-18: 50 mg via ORAL
  Filled 2016-09-18: qty 1

## 2016-09-18 NOTE — Plan of Care (Signed)
Problem: RH SKIN INTEGRITY Goal: RH STG SKIN FREE OF INFECTION/BREAKDOWN Pt will remain free of skin breakdown and infection with mod I assist   Outcome: Progressing No additional skin breakdown noted

## 2016-09-18 NOTE — Progress Notes (Signed)
Occupational Therapy Session Note  Patient Details  Name: Kristin Shaffer MRN: [id number] Date of Birth: [birth year]  Today's Date: 09/18/2016  Session 1 OT Individual Time: 0900-1011 OT Individual Time Calculation (min): 71 min   Session 2 OT Individual Time: 5825-1898 OT Individual Time Calculation (min): 30 min     Short Term Goals: Week 1:  OT Short Term Goal 1 (Week 1): LTG=STG 2/2 estimated short LOS  Skilled Therapeutic Interventions/Progress Updates:    Session 1 1:1 OT session focused on modified bathing/dressing, functional mobility, cognitive retraining. Pt initially alert, oriented, and appropriately conversational. After ~30 minutes through session, pt less alert, confused, slow to respond to questions- often would not respond to questions and would laugh inappropriately. Pt was perseverating during ADLs, restless, and demonstrated R sided coordination deficits during ADL tasks. Pt's daughter noticed change in mental status as well. Per pt's daughter, pt was alert, oriented, and almost her normal self- this morning. Pt's daughter reports change in cognition after morning meds. Pt's change in mental status greatly affected her ability to safely participate in ADLs today. RN notified. Pt left seated in w.c with daughter present and needs met.  Session 2 1:1 OT session focused on fine-motor coordination, cognitive retraining, and functional transfers. Pt continued to have confusion during OT session. Pt ambulated to bathroom with Min A, voided successfully, then ambulated to the sink to wash her hands. Pt able to sequence grooming task with supervision and without Vc. Pt then ambulated to therapy gym and completed 9 hole peg test and bead threading activity. Pt required Max cues to follow command to thread and unthread beads one-at a time. Pt unable to follow multi-step command, requiring OT to grade activity and give pt single bead at a time. Pt unable to appropriately answer OT  questions and would perseverate on words. Difficulty maintaining attention to task in moderately distracting envvironment. 9 hole peg test score almost doubled in time from last week. Pt returned to room at end of session and left with needs met.  10/23: L hand- 30 seconds R hand- 42 seconds 10/20: L hand -19 seconds.  R hand- 27 seconds.   Therapy Documentation Precautions:  Precautions Precautions: Fall Precaution Comments: hx of syncope Restrictions Weight Bearing Restrictions: No Pain:  none/denies pain ADL: ADL ADL Comments: See functional navigator     See Function Navigator for Current Functional Status.   Therapy/Group: Individual Therapy  [name] 09/18/2016, 4:04 PM

## 2016-09-18 NOTE — Progress Notes (Signed)
Subjective/Complaints: Pt laying in bed this AM.  Son at bedside, who notes that pt became more distracted as day went on yesterday, but is more alert and interactive this morning.  He notes pt still sleeps poorly.    ROS: No N/V/D, no facial pain or HA, no CP or SOB  Objective: Vital Signs: Blood pressure (!) 160/52, pulse 68, temperature 98.1 F (36.7 C), temperature source Oral, resp. rate 18, SpO2 95 %. No results found. No results found for this or any previous visit (from the past 72 hour(s)).   HEENT: periorbital bruising and edema (improving).  Cardio: RRR and no murmur Resp: CTA B/L and unlabored GI: BS positive and NT, ND Skin:   Bruise maxillary. Warm and dry.  Neuro: Alert/Oriented Motor: 4+/5 in BUE and BLE grossly (right stronger than left) Musc/Skel:  No edema, no tenderness in extremities. Gen NAD. Vital signs reviewed.    Assessment/Plan: 1. Functional deficits secondary to TBI/SDH/SAH which require 3+ hours per day of interdisciplinary therapy in a comprehensive inpatient rehab setting. Physiatrist is providing close team supervision and 24 hour management of active medical problems listed below. Physiatrist and rehab team continue to assess barriers to discharge/monitor patient progress toward functional and medical goals. FIM: Function - Bathing Position: Shower Body parts bathed by patient: Right arm, Left arm, Chest, Abdomen, Front perineal area, Buttocks, Right upper leg, Left upper leg Body parts bathed by helper: Back Assist Level: Touching or steadying assistance(Pt > 75%)  Function- Upper Body Dressing/Undressing What is the patient wearing?: Bra, Pull over shirt/dress Bra - Perfomed by patient: Thread/unthread right bra strap, Thread/unthread left bra strap, Hook/unhook bra (pull down sports bra) Bra - Perfomed by helper: Hook/unhook bra (pull down sports bra) Pull over shirt/dress - Perfomed by patient: Thread/unthread right sleeve,  Thread/unthread left sleeve, Put head through opening, Pull shirt over trunk Assist Level: Set up, More than reasonable time Set up : To obtain clothing/put away Function - Lower Body Dressing/Undressing What is the patient wearing?: Pants, Underwear, Socks, Shoes Position: Sitting EOB Underwear - Performed by patient: Thread/unthread right underwear leg, Thread/unthread left underwear leg, Pull underwear up/down Underwear - Performed by helper: Thread/unthread right underwear leg, Thread/unthread left underwear leg Pants- Performed by patient: Thread/unthread right pants leg, Thread/unthread left pants leg, Pull pants up/down, Fasten/unfasten pants Pants- Performed by helper: Thread/unthread right pants leg, Thread/unthread left pants leg Socks - Performed by patient: Don/doff right sock, Don/doff left sock Socks - Performed by helper: Don/doff right sock, Don/doff left sock Shoes - Performed by patient: Don/doff right shoe, Don/doff left shoe Assist for footwear: Supervision/touching assist Assist for lower body dressing: Touching or steadying assistance (Pt > 75%)  Function - Toileting Toileting steps completed by patient: Performs perineal hygiene Toileting steps completed by helper: Adjust clothing prior to toileting, Adjust clothing after toileting Toileting Assistive Devices: Grab bar or rail Assist level: Supervision or verbal cues  Function Midwife transfer assistive device: Elevated toilet seat/BSC over toilet, Walker Assist level to toilet: Supervision or verbal cues Assist level from toilet: Supervision or verbal cues  Function - Chair/bed transfer Chair/bed transfer method: Ambulatory Chair/bed transfer assist level: Touching or steadying assistance (Pt > 75%) Chair/bed transfer assistive device: Armrests, Walker Chair/bed transfer details: Verbal cues for technique, Verbal cues for precautions/safety, Verbal cues for safe use of DME/AE  Function -  Locomotion: Wheelchair Will patient use wheelchair at discharge?: No Function - Locomotion: Ambulation Assistive device: Walker-rolling Max distance: 150 ft Assist level:  Supervision or verbal cues Assist level: Supervision or verbal cues Assist level: Supervision or verbal cues Assist level: Supervision or verbal cues Assist level: Supervision or verbal cues  Function - Comprehension Comprehension: Auditory Comprehension assist level: Understands basic 75 - 89% of the time/ requires cueing 10 - 24% of the time  Function - Expression Expression: Verbal Expression assist level: Expresses basic 75 - 89% of the time/requires cueing 10 - 24% of the time. Needs helper to occlude trach/needs to repeat words.  Function - Social Interaction Social Interaction assist level: Interacts appropriately 75 - 89% of the time - Needs redirection for appropriate language or to initiate interaction.  Function - Problem Solving Problem solving assist level: Solves basic 75 - 89% of the time/requires cueing 10 - 24% of the time  Function - Memory Memory assist level: Recognizes or recalls 75 - 89% of the time/requires cueing 10 - 24% of the time Patient normally able to recall (first 3 days only): Current season  Medical Problem List and Plan: 1.  Traumatic TBI /SDH/SAH as well as maxillofacial fractures secondary to syncopal event.  Cont CIR  2.  DVT Prophylaxis/Anticoagulation: SCDs. Monitor for any signs of DVT 3. Pain Management: Flexeril as needed for muscle spasms as prior to admission. 4. Seizure prophylaxis. Keppra 500 mg twice a day. EEG negative 5. Neuropsych: This patient is capable of making decisions on her own behalf. 6. Skin/Wound Care: Routine skin checks 7. Fluids/Electrolytes/Nutrition: Routine I&Os   Dietitian consult 8. CAD/NSTEMI status post stenting.   Plan to resume aspirin in ~1 week at 81 mg daily and Plavix in 2 weeks.   Will plan to start tomorrow  No chest pain or  shortness of breath 9. Hypertension.   Lisinopril 10 mg daily  Lopressor 12.5 mg twice a day, increased to 25.   Labile over last 24 hours, will cont to monitor Vitals:   09/18/16 0546 09/18/16 0749  BP: (!) 172/57 (!) 160/52  Pulse: 71 68  Resp: 18   Temp: 98.1 F (36.7 C)   10. Hypothyroidism. Synthroid. 11. Hyperlipidemia.Zetia/Lipitor 12. Sleep disturbance  Resulting in fatigue and increasing cognitive difficulties as day progresses  Trazodone scheduled  LOS (Days) 5 A FACE TO FACE EVALUATION WAS PERFORMED  Ankit Lorie Phenix 09/18/2016, 8:46 AM

## 2016-09-18 NOTE — Progress Notes (Signed)
Speech Language Pathology Daily Session Note  Patient Details  Name: Kristin Shaffer MRN: [id number] Date of Birth: Dec 21, 1930  Today's Date: 09/18/2016 SLP Individual Time: 1340-1435 SLP Individual Time Calculation (min): 55 min   Short Term Goals:Week 1: SLP Short Term Goal 1 (Week 1): STG=LTG due to ELOS   Skilled Therapeutic Interventions: Pt was seen for skilled ST targeting cognitive goals.  Pt with marked cognitive decline in comparison to initial evaluation.  Pt now needed max assist multimodal cues to initiate verbal expression and respond to questions appropriately.  Pt also  laughing inappropriately in response to basic questions.  Pt needed min verbal cues for safe walker use while ambulating around the unit and was able to recall route back to her room with supervision.  Family reports that these cognitive changes seem to be centered around administration of Keppra.   Family also endorsed that pt will have 24/7 supervision at discharge in the event that these cognitive changes are more persistent and malignant in nature.  Discussed with RN and PA who is adjusting medications accordingly.  Pt handed off to primary OT.  Continue per current plan of care.       Function:  Eating Eating              Cognition Comprehension Comprehension assist level: Understands basic 50 - 74% of the time/ requires cueing 25 - 49% of the time  Expression   Expression assist level: Expresses basic 25 - 49% of the time/requires cueing 50 - 75% of the time. Uses single words/gestures.  Social Interaction Social Interaction assist level: Interacts appropriately 50 - 74% of the time - May be physically or verbally inappropriate.  Problem Solving Problem solving assist level: Solves basic 25 - 49% of the time - needs direction more than half the time to initiate, plan or complete simple activities  Memory Memory assist level: Recognizes or recalls 25 - 49% of the time/requires cueing 50 - 75% of  the time    Pain Pain Assessment Pain Assessment: No/denies pain  Therapy/Group: Individual Therapy  Zyree Traynham, Selinda Orion 09/18/2016, 4:10 PM

## 2016-09-18 NOTE — Progress Notes (Signed)
Physical Therapy Session Note  Patient Details  Name: Kristin Shaffer MRN: [id number] Date of Birth: 1931/11/15  Today's Date: 09/18/2016 PT Individual Time: 1035-1158 PT Individual Time Calculation (min): 83 min   Short Term Goals: Week 1:  PT Short Term Goal 1 (Week 1): = LTGs due to anticpated LOS  Skilled Therapeutic Interventions/Progress Updates:    Patient in wheelchair with daughter present upon arrival. Discussed with patient and daughter that patient was close to baseline cognition this morning but after receiving morning medications, patient less alert, more confused, with R side coordination and strength deficits during OT session. Patient with continued confusion and difficulty answering questions, responding with one-word answers or repeating question back to therapist, RUE weakness, difficulty following commands, RN aware. Patient required multiple attempts to achieve sit <> stand from wheelchair with min A. Gait training using RW 2 x 150 ft + 300 ft with max verbal/tactile cues for obstacle negotiation on R with supervision overall. Session focused on seated/standing activities to address functional bimanual tasks and R sided coordination including putting pillowcases on pillows with more than reasonable time (approx 3 min each), folding towels, and basic card matching task with max multimodal cues for attention to task, R attention, organization, and following basic 1 step commands. In sitting, completed basic 3D pipetree puzzle with pieces preselected with mod multimodal cues and more than reasonable time and increased difficulty with gross grasp/release RUE. Standing with RW, instructed in sorting food item bean bags into fruits/veggies with 70% accuracy and max multimodal cues for attention to task and use of RUE. Seated BP initially 157/135, rechecked with max cues to "relax" RUE with second reading = 125/62, HR 97% Sp02 on room air, and HR 64. Stair training up/down 12 stairs using  2 rails with supervision. In day room, patient instructed in ambulating with RW to water flowers on countertop, requiring max multimodal cues to total assist to complete task due to perseveration on spinning flower pots and patient unable to follow basic commands. Patient left sitting edge of bed to eat lunch with daughter in room, RN updated on continued decline in cognition/R strength.   Therapy Documentation Precautions:  Precautions Precautions: Fall Precaution Comments: hx of syncope Restrictions Weight Bearing Restrictions: No Vital Signs: Therapy Vitals Pulse Rate: 64 BP: 125/62 Patient Position (if appropriate): Sitting Oxygen Therapy SpO2: 97 % O2 Device: Not Delivered Pain: Pain Assessment Pain Assessment: Faces Faces Pain Scale: No hurt   See Function Navigator for Current Functional Status.   Therapy/Group: Individual Therapy  [name] 09/18/2016, 12:18 PM

## 2016-09-18 NOTE — Plan of Care (Signed)
Problem: RH PAIN MANAGEMENT Goal: RH STG PAIN MANAGED AT OR BELOW PT'S PAIN GOAL <4  Outcome: Progressing No c/o pain     

## 2016-09-19 ENCOUNTER — Inpatient Hospital Stay (HOSPITAL_COMMUNITY): Payer: Medicare Other | Admitting: Speech Pathology

## 2016-09-19 ENCOUNTER — Inpatient Hospital Stay (HOSPITAL_COMMUNITY): Payer: Medicare Other | Admitting: Occupational Therapy

## 2016-09-19 ENCOUNTER — Inpatient Hospital Stay (HOSPITAL_COMMUNITY): Payer: Medicare Other | Admitting: Physical Therapy

## 2016-09-19 ENCOUNTER — Inpatient Hospital Stay (HOSPITAL_COMMUNITY): Payer: Medicare Other

## 2016-09-19 LAB — URINALYSIS, ROUTINE W REFLEX MICROSCOPIC
BILIRUBIN URINE: NEGATIVE
Glucose, UA: NEGATIVE mg/dL
Ketones, ur: NEGATIVE mg/dL
NITRITE: POSITIVE — AB
PH: 6 (ref 5.0–8.0)
Protein, ur: NEGATIVE mg/dL
SPECIFIC GRAVITY, URINE: 1.01 (ref 1.005–1.030)

## 2016-09-19 LAB — URINE MICROSCOPIC-ADD ON

## 2016-09-19 MED ORDER — TRAZODONE HCL 50 MG PO TABS
100.0000 mg | ORAL_TABLET | Freq: Every day | ORAL | Status: DC
  Administered 2016-09-19 – 2016-09-24 (×6): 100 mg via ORAL
  Filled 2016-09-19 (×7): qty 2

## 2016-09-19 MED ORDER — ASPIRIN EC 81 MG PO TBEC
81.0000 mg | DELAYED_RELEASE_TABLET | Freq: Every day | ORAL | Status: DC
  Administered 2016-09-19 – 2016-09-25 (×7): 81 mg via ORAL
  Filled 2016-09-19 (×7): qty 1

## 2016-09-19 NOTE — Progress Notes (Signed)
Pt restless during the night of 10/23. Restlessness increased after administration of Keppra. Pt continuously climbed out of bed and removed her pajama top, exposing herself. When spoken to and/or during reorientation attempts, pt would laugh inappropriately. Pt monitored overnight for any more signs of confusion. Annita Brod, RN  09/19/2016  7:46 AM

## 2016-09-19 NOTE — Plan of Care (Signed)
Problem: RH Balance Goal: LTG Patient will maintain dynamic standing with ADLs (OT) LTG:  Patient will maintain dynamic standing balance with assist during activities of daily living (OT)   Goals downgraded 2/2 increased confusion -ESD 10/24  Problem: RH Grooming Goal: LTG Patient will perform grooming w/assist,cues/equip (OT) LTG: Patient will perform grooming with assist, with/without cues using equipment (OT)  Goals downgraded 2/2 increased confusion - ESD 10/24  Problem: RH Bathing Goal: LTG Patient will bathe with assist, cues/equipment (OT) LTG: Patient will bathe specified number of body parts with assist with/without cues using equipment (position)  (OT)  Goals downgraded 2/2 increased confusion - ESD 10/24  Problem: RH Dressing Goal: LTG Patient will perform upper body dressing (OT) LTG Patient will perform upper body dressing with assist, with/without cues (OT).  Goals downgraded 2/2 increased confusion - ESD 10/24 Goal: LTG Patient will perform lower body dressing w/assist (OT) LTG: Patient will perform lower body dressing with assist, with/without cues in positioning using equipment (OT)  Goals downgraded 2/2 increased confusion - ESD 10/24  Problem: RH Toileting Goal: LTG Patient will perform toileting w/assist, cues/equip (OT) LTG: Patient will perform toiletiing (clothes management/hygiene) with assist, with/without cues using equipment (OT)  Goals downgraded 2/2 increased confusion - ESD 10/24  Problem: RH Simple Meal Prep Goal: LTG Patient will perform simple meal prep w/assist (OT) LTG: Patient will perform simple meal prep with assistance, with/without cues (OT).  Goals downgraded 2/2 increased confusion - ESD 10/24  Problem: RH Laundry Goal: LTG Patient will perform laundry w/assist, cues (OT) LTG: Patient will perform laundry with assistance, with/without cues (OT).  Goals downgraded 2/2 increased confusion - ESD 10/24  Problem: RH Toilet Transfers Goal:  LTG Patient will perform toilet transfers w/assist (OT) LTG: Patient will perform toilet transfers with assist, with/without cues using equipment (OT)  Goals downgraded 2/2 increased confusion - ESD 10/24  Problem: RH Tub/Shower Transfers Goal: LTG Patient will perform tub/shower transfers w/assist (OT) LTG: Patient will perform tub/shower transfers with assist, with/without cues using equipment (OT)  Goals downgraded 2/2 increased confusion - ESD 10/24  Problem: RH Memory Goal: LTG Patient will demonstrate ability for day to day (OT) LTG:  Patient will demonstrate ability for day to day recall/carryover during activities of daily living with assist  (OT)  Goals downgraded 2/2 increased confusion - ESD 10/24  Comments: Goals downgraded 2/2 increased confusion - ESD 10/24

## 2016-09-19 NOTE — Progress Notes (Signed)
Physical Therapy Session Note  Patient Details  Name: TEMPLE HAZELIP MRN: [id number] Date of Birth: 21-Jan-1931  Today's Date: 09/19/2016 PT Individual Time: 1000-1100 and 1300-1400 PT Individual Time Calculation (min): 60 min and 60 min    Short Term Goals: Week 1:  PT Short Term Goal 1 (Week 1): = LTGs due to anticpated LOS  Skilled Therapeutic Interventions/Progress Updates:    Treatment 1: Patient asleep in bed upon arrival, easily awakened to voice. Patient initially appeared confused but was oriented throughout session although she continues to have difficulty with initiation of verbal expression and inappropriate laughter. Prior to administration of morning medication, no RUE weakness/incoordination noted during session. Patient required 2 attempts and min A for initial sit > stand from bed and supervision for remainder of sit <> stand transfers throughout session with uncontrolled descent. Patient toileted using RW with supervision and stood at sink for hand hygiene and brushing hair without UE support with supervision. Gait training 2 x 150 ft using RW with supervision with cues for safe use of RW. Reassessed Berg Balance Scale with score of 32/56, see details below, with no change noted from initial assessment on 09/14/16 and continued increased risk of falls. Patient required multiple seated rest breaks throughout session. Seated EOM, engaged in cognitive task of n-back (with face cards removed) to address working memory and attention to task with max verbal/visual cues and increased time and total A without cues. Patient left sitting in recliner with family present.   Treatment 2: Patient in recliner with son in room. Session focused on functional ambulation and safety using RW in room for toileting and from room to and from day room with cues for R attention and obstacle negotiation on R, attention in highly distracting environment with pumpkin painting task, standing tolerance x 6 min  using RW with supervision while engaging in naming task with cues to describe object, and dynamic standing balance with RW as needed for cornhole bean bag toss with supervision-min A. Patient required cues for taking seated rest breaks when fatigued. Patient unable to answer basic yes/no questions regarding activities completed with inappropriate laughter throughout session. Patient left sitting in recliner with quick release belt and call bell in lap.   Therapy Documentation Precautions:  Precautions Precautions: Fall Precaution Comments: hx of syncope Restrictions Weight Bearing Restrictions: No Vital Signs: Therapy Vitals Pulse Rate: 72 BP: (!) 134/52 Pain: Pain Assessment Pain Assessment: Faces Faces Pain Scale: No hurt Balance: Balance Balance Assessed: Yes Standardized Balance Assessment Standardized Balance Assessment: Berg Balance Test Berg Balance Test Sit to Stand: Able to stand  independently using hands Standing Unsupported: Able to stand safely 2 minutes Sitting with Back Unsupported but Feet Supported on Floor or Stool: Able to sit safely and securely 2 minutes Stand to Sit: Sits independently, has uncontrolled descent Transfers: Able to transfer with verbal cueing and /or supervision Standing Unsupported with Eyes Closed: Able to stand 10 seconds with supervision Standing Ubsupported with Feet Together: Needs help to attain position but able to stand for 30 seconds with feet together From Standing, Reach Forward with Outstretched Arm: Reaches forward but needs supervision From Standing Position, Pick up Object from Floor: Able to pick up shoe, needs supervision From Standing Position, Turn to Look Behind Over each Shoulder: Looks behind from both sides and weight shifts well Turn 360 Degrees: Needs close supervision or verbal cueing Standing Unsupported, Alternately Place Feet on Step/Stool: Able to complete >2 steps/needs minimal assist Standing Unsupported, One  Foot in  Front: Able to plae foot ahead of the other independently and hold 30 seconds Standing on One Leg: Tries to lift leg/unable to hold 3 seconds but remains standing independently Total Score: 32/56  See Function Navigator for Current Functional Status.   Therapy/Group: Individual Therapy  [name] 09/19/2016, 12:12 PM

## 2016-09-19 NOTE — Plan of Care (Signed)
Problem: RH SAFETY Goal: RH STG ADHERE TO SAFETY PRECAUTIONS W/ASSISTANCE/DEVICE STG Adhere to Safety Precautions With Assistance/Device. Supervision  Outcome: Not Progressing Patient remains impulsive. Family supervision at [facility].

## 2016-09-19 NOTE — Progress Notes (Signed)
Occupational Therapy Session Note  Patient Details  Name: Kristin Shaffer MRN: [id number] Date of Birth: 20-Aug-1931  Today's Date: 09/19/2016 OT Individual Time: 1440-1535 OT Individual Time Calculation (min): 55 min     Short Term Goals: Week 1:  OT Short Term Goal 1 (Week 1): LTG=STG 2/2 estimated short LOS  Skilled Therapeutic Interventions/Progress Updates:   1:1 OT session focused on cognitive retraining, sequencing, problem solving, visuospatial skills, and fine-,motor coordination. OT utilized 3 step sequence cards picturing everyday activities. Pt able to sequence 3 out of 5 sets without cues. She required Min questioning cues to correct the 4th set, and was unable to correct 5th set with Max cues. Utilized "tall-stacker pegs" and peg board w/ color photo for pt to copy pattern. Pt able to copy simple alternating color horizontal pattern without cues, but required min questioning cues to problem solve and orient pegs on correct row. Second pattern had horizontal and vertical rows with 4 colors. Pt with increased difficulty and confusion w/ task. OT graded activity by covering up portions of picture to complete parts of pattern in segments. Pt with increased success with OT intervention, but continued to require Max questioning cues to problem solve and complete activity. Pt completed same pattern 4 days prior in OT session with minimal difficulty and min questioning cues. Pt returned to room at end of session with cues for safety w/ RW and stand>sit. Pt left with safety belt on and son present.   Therapy Documentation Precautions:  Precautions Precautions: Fall Precaution Comments: hx of syncope Restrictions Weight Bearing Restrictions: No Pain: Pain Assessment Pain Assessment: 0-10 Pain Score: 0-No pain ADL: ADL ADL Comments: See functional navigator   See Function Navigator for Current Functional Status.   Therapy/Group: Individual Therapy  [name] 09/19/2016,  3:48 PM

## 2016-09-19 NOTE — Progress Notes (Addendum)
Subjective/Complaints: Pt sitting up in bed this AM, working with SLP.  Pt's son again expresses concerns about patient's cognition.  Spoke to son again about sleep hygiene and effects on brain injury.  Keppra also decreased overnight.  Per nursing report, pt did not sleep well last night either, although, this may have some what improved.   ROS: No N/V/D, no facial pain or HA, no CP or SOB  Objective: Vital Signs: Blood pressure (!) 143/46, pulse 73, temperature 98.2 F (36.8 C), temperature source Oral, resp. rate 17, SpO2 98 %. No results found. No results found for this or any previous visit (from the past 72 hour(s)).   HEENT: Periorbital bruising and edema (improving).  Cardio: RRR. No JVD Resp: CTA B/L and unlabored GI: BS positive and NT, ND Skin:   Bruise maxillary. Warm and dry.  Neuro: Alert, Oriented x3. Somewhat slowed, but able to answer most questions.  Motor: 4+/5 throughout  Musc/Skel:  No edema, no tenderness in extremities. Gen NAD. Vital signs reviewed.    Assessment/Plan: 1. Functional deficits secondary to TBI/SDH/SAH which require 3+ hours per day of interdisciplinary therapy in a comprehensive inpatient rehab setting. Physiatrist is providing close team supervision and 24 hour management of active medical problems listed below. Physiatrist and rehab team continue to assess barriers to discharge/monitor patient progress toward functional and medical goals. FIM: Function - Bathing Position: Shower Body parts bathed by patient: Right arm, Left arm, Chest, Abdomen, Front perineal area, Buttocks, Right upper leg, Left upper leg Body parts bathed by helper: Back Assist Level: Touching or steadying assistance(Pt > 75%)  Function- Upper Body Dressing/Undressing What is the patient wearing?: Bra, Pull over shirt/dress Bra - Perfomed by patient: Thread/unthread right bra strap, Thread/unthread left bra strap, Hook/unhook bra (pull down sports bra) Bra -  Perfomed by helper: Hook/unhook bra (pull down sports bra) Pull over shirt/dress - Perfomed by patient: Thread/unthread right sleeve, Thread/unthread left sleeve, Put head through opening, Pull shirt over trunk Assist Level: Set up, Supervision or verbal cues, More than reasonable time Set up : To obtain clothing/put away Function - Lower Body Dressing/Undressing What is the patient wearing?: Pants, Underwear, Socks, Shoes Position: Wheelchair/chair at sink Underwear - Performed by patient: Thread/unthread left underwear leg Underwear - Performed by helper: Thread/unthread right underwear leg, Pull underwear up/down Pants- Performed by patient: Thread/unthread left pants leg Pants- Performed by helper: Thread/unthread right pants leg, Pull pants up/down Socks - Performed by patient: Don/doff right sock, Don/doff left sock Socks - Performed by helper: Don/doff right sock, Don/doff left sock Shoes - Performed by patient: Don/doff right shoe, Don/doff left shoe Shoes - Performed by helper: Don/doff left shoe, Don/doff right shoe Assist for footwear: Partial/moderate assist Assist for lower body dressing: Touching or steadying assistance (Pt > 75%)  Function - Toileting Toileting steps completed by patient: Performs perineal hygiene Toileting steps completed by helper: Adjust clothing prior to toileting, Adjust clothing after toileting Toileting Assistive Devices: Grab bar or rail Assist level: Supervision or verbal cues  Function Midwife transfer assistive device: Grab bar, Elevated toilet seat/BSC over toilet Assist level to toilet: Touching or steadying assistance (Pt > 75%) Assist level from toilet: Supervision or verbal cues  Function - Chair/bed transfer Chair/bed transfer method: Ambulatory Chair/bed transfer assist level: Supervision or verbal cues Chair/bed transfer assistive device: Armrests, Walker Chair/bed transfer details: Verbal cues for technique, Verbal  cues for precautions/safety, Verbal cues for safe use of DME/AE  Function - Locomotion: Wheelchair  Will patient use wheelchair at discharge?: No Function - Locomotion: Ambulation Assistive device: Walker-rolling Max distance: 150 ft Assist level: Supervision or verbal cues Assist level: Supervision or verbal cues Assist level: Supervision or verbal cues Assist level: Supervision or verbal cues Assist level: Supervision or verbal cues  Function - Comprehension Comprehension: Auditory Comprehension assist level: Understands basic 50 - 74% of the time/ requires cueing 25 - 49% of the time  Function - Expression Expression: Verbal Expression assist level: Expresses basic 25 - 49% of the time/requires cueing 50 - 75% of the time. Uses single words/gestures.  Function - Social Interaction Social Interaction assist level: Interacts appropriately 50 - 74% of the time - May be physically or verbally inappropriate.  Function - Problem Solving Problem solving assist level: Solves basic 25 - 49% of the time - needs direction more than half the time to initiate, plan or complete simple activities  Function - Memory Memory assist level: Recognizes or recalls 25 - 49% of the time/requires cueing 50 - 75% of the time Patient normally able to recall (first 3 days only): That he or she is in a hospital  Medical Problem List and Plan: 1.  Traumatic TBI /SDH/SAH as well as maxillofacial fractures secondary to syncopal event.  Cont CIR   Adjusting sleep meds, Keppra, UA/cx for ?cognitive changes 2.  DVT Prophylaxis/Anticoagulation: SCDs. Monitor for any signs of DVT 3. Pain Management: Flexeril as needed for muscle spasms as prior to admission. 4. Seizure prophylaxis. Keppra 500 mg twice a day, decreased to 250 BID on 10/23 per family insistance. EEG negative 5. Neuropsych: This patient is capable of making decisions on her own behalf. 6. Skin/Wound Care: Routine skin checks 7.  Fluids/Electrolytes/Nutrition: Routine I&Os   Dietitian consult 8. CAD/NSTEMI status post stenting.   Resumed aspirin 10/24 and Plavix in 1 weeks, monitor for bleed.   No chest pain or shortness of breath 9. Hypertension.   Lisinopril 10 mg daily  Lopressor 12.5 mg twice a day, increased to 25.   Improving, will cont to monitor Vitals:   09/18/16 1300 09/19/16 0530  BP: (!) 126/50 (!) 143/46  Pulse: 66 73  Resp:  17  Temp: 97.5 F (36.4 C) 98.2 F (36.8 C)  10. Hypothyroidism. Synthroid. 11. Hyperlipidemia.Zetia/Lipitor 12. Sleep disturbance  Resulting in fatigue and increasing cognitive difficulties as day progresses  Trazodone scheduled, increased to 100 10/24, likely cause of change in cognition, however, Keppra also decreased on 10/23  LOS (Days) 6 A FACE TO FACE EVALUATION WAS PERFORMED  Ankit Lorie Phenix 09/19/2016, 9:25 AM

## 2016-09-19 NOTE — Progress Notes (Signed)
Speech Language Pathology Daily Session Notes  Patient Details  Name: ELIESE SHOJI MRN: [id number] Date of Birth: [birth year]  Today's Date: 09/19/2016  Session 1 SLP Individual Time: [id number] SLP Individual Time Calculation (min): 60 min  Session 2 SLP Individual Time: 1130-1200 SLP Individual Time Calculation (min): 30 min     Short Term Goals: Week 1: SLP Short Term Goal 1 (Week 1): STG=LTG due to ELOS   Skilled Therapeutic Interventions:   Session 1 Skilled treatment session focused on addressing cognition goals. SLP facilitated session by providing Min question cues to utilize external aid for use of schedule or newspaper for orientation to day and date.  Patient also required Mod question cues to recall reason for fall and hospitalization.   Patient was able to verbally identify current deficits with extra time and Min question cues; however, patient's utterances were limited to brief phrases.  As a result, a structured naming task was initiated.  Patient named 4 items within a given category in one minute, which is significantly below with norm of 11. As a result discussed adding a word finding goal with patient and son.  They were in agreement.  Care plan modified to include a goal for word finding.   Session 2 Skilled treatment session focused on addressing new word finding goal. SLP facilitated session by providing education regarding effective word finding strategies during a structured categorical naming task. Patient required Max cues to utilize visualization, description, and synonyms during task.  Verbal expression was characterized by 1-2 word verbal utterances that were frequently repetitive in nature.  RN aware of of change from this morning and family's concern.  Continue with current plan of care.    Function:  Cognition Comprehension Comprehension assist level: Understands basic 50 - 74% of the time/ requires cueing 25 - 49% of the time  Expression   Expression  assist level: Expresses basic 25 - 49% of the time/requires cueing 50 - 75% of the time. Uses single words/gestures.  Social Interaction Social Interaction assist level: Interacts appropriately 50 - 74% of the time - May be physically or verbally inappropriate.  Problem Solving Problem solving assist level: Solves basic 25 - 49% of the time - needs direction more than half the time to initiate, plan or complete simple activities  Memory Memory assist level: Recognizes or recalls 25 - 49% of the time/requires cueing 50 - 75% of the time    Pain Pain Assessment Pain Assessment: No/denies pain Faces Pain Scale: No hurt x2  Therapy/Group: Individual Therapy x2  Carmelia Roller., Luck [phone]  Lee 09/19/2016, 12:36 PM

## 2016-09-20 ENCOUNTER — Inpatient Hospital Stay (HOSPITAL_COMMUNITY): Payer: Medicare Other | Admitting: Occupational Therapy

## 2016-09-20 ENCOUNTER — Inpatient Hospital Stay (HOSPITAL_COMMUNITY): Payer: Medicare Other | Admitting: Speech Pathology

## 2016-09-20 ENCOUNTER — Inpatient Hospital Stay (HOSPITAL_COMMUNITY): Payer: Medicare Other | Admitting: Physical Therapy

## 2016-09-20 DIAGNOSIS — N179 Acute kidney failure, unspecified: Secondary | ICD-10-CM

## 2016-09-20 DIAGNOSIS — I629 Nontraumatic intracranial hemorrhage, unspecified: Secondary | ICD-10-CM

## 2016-09-20 DIAGNOSIS — N390 Urinary tract infection, site not specified: Secondary | ICD-10-CM

## 2016-09-20 LAB — CBC WITH DIFFERENTIAL/PLATELET
Basophils Absolute: 0.1 10*3/uL (ref 0.0–0.1)
Basophils Relative: 1 %
Eosinophils Absolute: 0.3 10*3/uL (ref 0.0–0.7)
Eosinophils Relative: 3 %
HEMATOCRIT: 37.8 % (ref 36.0–46.0)
HEMOGLOBIN: 12.7 g/dL (ref 12.0–15.0)
LYMPHS ABS: 2.5 10*3/uL (ref 0.7–4.0)
Lymphocytes Relative: 24 %
MCH: 30.2 pg (ref 26.0–34.0)
MCHC: 33.6 g/dL (ref 30.0–36.0)
MCV: 90 fL (ref 78.0–100.0)
MONOS PCT: 9 %
Monocytes Absolute: 0.9 10*3/uL (ref 0.1–1.0)
NEUTROS ABS: 6.7 10*3/uL (ref 1.7–7.7)
NEUTROS PCT: 63 %
Platelets: 321 10*3/uL (ref 150–400)
RBC: 4.2 MIL/uL (ref 3.87–5.11)
RDW: 14 % (ref 11.5–15.5)
WBC: 10.4 10*3/uL (ref 4.0–10.5)

## 2016-09-20 LAB — BASIC METABOLIC PANEL
ANION GAP: 10 (ref 5–15)
BUN: 25 mg/dL — ABNORMAL HIGH (ref 6–20)
CHLORIDE: 102 mmol/L (ref 101–111)
CO2: 25 mmol/L (ref 22–32)
Calcium: 9.4 mg/dL (ref 8.9–10.3)
Creatinine, Ser: 1.15 mg/dL — ABNORMAL HIGH (ref 0.44–1.00)
GFR calc non Af Amer: 43 mL/min — ABNORMAL LOW (ref >60)
GFR, EST AFRICAN AMERICAN: 50 mL/min — AB (ref >60)
Glucose, Bld: 119 mg/dL — ABNORMAL HIGH (ref 65–99)
Potassium: 4.6 mmol/L (ref 3.5–5.1)
Sodium: 137 mmol/L (ref 135–145)

## 2016-09-20 MED ORDER — NITROFURANTOIN MONOHYD MACRO 100 MG PO CAPS
100.0000 mg | ORAL_CAPSULE | Freq: Two times a day (BID) | ORAL | Status: DC
  Administered 2016-09-20 – 2016-09-25 (×11): 100 mg via ORAL
  Filled 2016-09-20 (×11): qty 1

## 2016-09-20 MED ORDER — ENSURE ENLIVE PO LIQD
237.0000 mL | Freq: Two times a day (BID) | ORAL | Status: DC
  Administered 2016-09-20 – 2016-09-24 (×7): 237 mL via ORAL

## 2016-09-20 NOTE — Progress Notes (Signed)
Physical Therapy Session Note  Patient Details  Name: Kristin Shaffer MRN: [id number] Date of Birth: June 01, 1931  Today's Date: 09/20/2016 PT Individual Time: 1300-1353 PT Individual Time Calculation (min): 53 min    Short Term Goals: Week 1:  PT Short Term Goal 1 (Week 1): = LTGs due to anticpated LOS  Skilled Therapeutic Interventions/Progress Updates:    Patient awake in bed upon arrival, requiring increased time and cues to initiate sitting EOB with supervision and patient eventually c/o being "super tired." Patient able to name visitors present but unable to answer basic questions throughout session. Gait training using RW with min cues for R attention including controlled environment, up/down ramp, and across uneven woodchip surface with supervision and simulated car transfer to sedan height using RW with min A. Seated EOM, patient put pillow cases on 2 pillows with increased time for functional use RUE. In seated position, engaged in dual cognitive task with ball toss and structured naming task within a category with increased time and max cues due to repeating items and mod faded to max assist to complete task. Performed seated Kinetron at 10 cm/sec, 5 trials of 30 sec on/30 sec rest progressed to 2 min without rest for NMR and endurance. Patient ambulated back to room using RW with cues to keep RW closer to body due to RW drifting to left and patient keeping RLE outside of RW. Patient continues to require max multimodal cues for safe hand placement with sit <> stand transfers using RWw. Patient agreeable to sit up in recliner to await next session with son Bruce in room and call bell in reach.   Therapy Documentation Precautions:  Precautions Precautions: Fall Precaution Comments: hx of syncope Restrictions Weight Bearing Restrictions: No Pain:  Faces No hurt  See Function Navigator for Current Functional Status.   Therapy/Group: Individual Therapy  [name] 09/20/2016, 2:11 PM

## 2016-09-20 NOTE — Progress Notes (Signed)
Nutrition Follow-up  DOCUMENTATION CODES:   Not applicable  INTERVENTION:  Provide Ensure Enlive po BID, each supplement provides 350 kcal and 20 grams of protein  Encourage adequate PO intake.   NUTRITION DIAGNOSIS:   Increased nutrient needs related to  (acute injury) as evidenced by estimated needs; ongoing  GOAL:   Patient will meet greater than or equal to 90% of their needs; progressing  MONITOR:   PO intake, Supplement acceptance, Labs, Weight trends, Skin, I & O's  REASON FOR ASSESSMENT:   Consult Assessment of nutrition requirement/status  ASSESSMENT:   82 y.o. right handed female with history of CAD status post NSTEMI with stenting maintained on Plavix and aspirin, hypertension, pulmonary fibrosis. Presented 09/09/2016 after syncopal episode while working at [employer]. Denied any chest pain or shortness of breath. Cranial CT scan showed extensive subarachnoid hemorrhage throughout the right sylvian fissure and lateral left temporal lobe. Hemorrhagic contusion anterior right temporal tip and anterior frontal lobes right greater than left. Large right periorbital hematoma with extensive inflammatory changes. Comminuted anterior maxillary sinus fracture with blood throughout the right maxillary sinus. Minimally displaced frontal process maxillary fracture right nasal bone fracture.  Meal completion has been varied from 25-100%. Pt currently has Ensure ordered and has been consuming them. RD to increase Ensure to BID to aid in caloric and protein needs as meal completion has been more varied recently.   Labs and medications reviewed.   Diet Order:  Diet Heart Room service appropriate? Yes; Fluid consistency: Thin  Skin:   (Abrasion on eye, face, and neck brusing)  Last BM:  10/23  Height:   Ht Readings from Last 1 Encounters:  09/11/16 5\' 6"  (1.676 m)    Weight:   Wt Readings from Last 1 Encounters:  09/20/16 173 lb 1 oz (78.5 kg)    Ideal Body Weight:   59 kg  BMI:  Body mass index is 27.93 kg/m.  Estimated Nutritional Needs:   Kcal:  1750-1900  Protein:  75-85 grams  Fluid:  1.7 - 1.9 L/day  EDUCATION NEEDS:   No education needs identified at this time  Corrin Parker, MS, RD, LDN Pager # [phone] After hours/ weekend pager # [phone]

## 2016-09-20 NOTE — Progress Notes (Signed)
Occupational Therapy Session Note  Patient Details  Name: Kristin Shaffer MRN: [id number] Date of Birth: [birth year]  Today's Date: 09/20/2016  Session 1 OT Individual Time: 1000-1100 OT Individual Time Calculation (min): 60 min   Session 2 OT Individual Time: 1430-1530 OT Individual Time Calculation (min): 60 min    Short Term Goals: Week 1:  OT Short Term Goal 1 (Week 1): LTG=STG 2/2 estimated short LOS  Skilled Therapeutic Interventions/Progress Updates:    Session 1 OT session focused on modified bathing/dressing, task sequencing, initiation, and attention. Asked pt's family to step out during OT session to minimize environmental distractions. Pt w/ difficulty verbally responding to questions, but able to follow 50% commands with instructional cues. Pt w/ difficulty sequencing and transitioning from tasks, requiring OT intervention. Backward chaining utilized during LB dressing to increase success with task. Pt continued to have R UE coordination deficits during ADLs, dropping objects often.  At end of session, pt left seated in recliner chair with safety belt on and family present.   Session 2 1:1 OT session focused on cognitive training, sequencing, problem solving, attention, in-hand manipulation, and fine motor coordination. Pt able to follow two step commands today with min instructional cues, increased appropriate conversation when prompted. Utilized number scavenger hunt in moderately distracting hallway environment. Min cues to locate numbers on R side, able to sequence 9/10 numbers without assistance. Utilized graded beading activity and worked on Data processing manager. Pt w/ difficulty w/ translation and rotation of objects. Per pt's son, pt had difficulty managing fork today. OT provided pt with built-up handle to use while eating supper and educated pt's son on techniques to best assist his mother. Pt left supine in bed with alarm on and son present.      Therapy Documentation Precautions:  Precautions Precautions: Fall Precaution Comments: hx of syncope Restrictions Weight Bearing Restrictions: No Pain: Pain Assessment Pain Assessment: No/denies pain ADL: ADL ADL Comments: See functional navigator  See Function Navigator for Current Functional Status.   Therapy/Group: Individual Therapy  [name] 09/20/2016, 3:59 PM

## 2016-09-20 NOTE — Patient Care Conference (Signed)
Inpatient RehabilitationTeam Conference and Plan of Care Update Date: 09/20/2016   Time: 2:15 PM    Patient Name: Kristin Shaffer      Medical Record Number: [id number]  Date of Birth: 06-09-1931 Sex: Female         Room/Bed: 4M03C/4M03C-01 Payor Info: Payor: MEDICARE / Plan: MEDICARE PART A AND B / Product Type: *No Product type* /    Admitting Diagnosis: TBI  Admit Date/Time:  09/13/2016  3:42 PM Admission Comments: No comment available   Primary Diagnosis:  <principal problem not specified> Principal Problem: <principal problem not specified>  Patient Active Problem List   Diagnosis Date Noted  . Intracranial hemorrhage ([place])   . AKI (acute kidney injury) ([place])   . Acute lower UTI   . Sleep disturbance   . 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 Labile blood pressure   . Fall 09/13/2016  . Syncope   . Closed fracture of maxillary sinus ([place])   . Subarachnoid hemorrhage ([place])   . Subdural hematoma ([place])   . Seizure prophylaxis   . Coronary artery disease involving coronary bypass graft of native heart without angina pectoris   . Benign essential HTN   . Facial fracture ([place]) 09/09/2016  . TBI (traumatic brain injury) ([place]) 09/09/2016  . Pulmonary fibrosis ([place]) 05/22/2016  . DOE (dyspnea on exertion) 04/14/2016  . Right flank pain 04/14/2016  . Pulmonary hypertension 04/14/2016  . Muscle spasm of back 09/15/2015  . Myalgia 03/23/2015  . Intermediate coronary syndrome ([place]) 12/07/2013  . Hyperlipidemia 12/03/2013  . Presence of drug coated stent in right coronary artery 03/22/2013  . NSTEMI (non-ST elevated myocardial infarction) ([place]) 03/21/2013  . Unstable angina 03/20/2013  . Hypertensive urgency -Accelerated Hypertension 03/20/2013  . GERD (gastroesophageal reflux disease) 03/20/2013  . Hypothyroidism 03/20/2013    Expected Discharge Date: Expected Discharge Date: 09/27/16  Team Members Present: Physician leading  conference: Dr. Delice Lesch Social Worker Present: Ovidio Kin, LCSW Nurse Present: Other (comment) Drucie Opitz) PT Present: Carney Living, PT OT Present: Other (comment) Grayland Ormond DOe-OT) SLP Present: Windell Moulding, SLP PPS Coordinator present : Daiva Nakayama, RN, CRRN     Current Status/Progress Goal Weekly Team Focus  Medical   Traumatic TBI /SDH/SAH as well as maxillofacial fractures secondary to syncopal event, with waxing/waning symptoms  Improve mobility, safety, cognition, sleep, UTI  See above   Bowel/Bladder   continent of bowel and bladder; LBM 10/23  pt to remain continent of bowel and bladder  monitor for changes in continence   Swallow/Nutrition/ Hydration             ADL's   Mod A, decline in cognition,  Goals downgraded to Min A/supervision 2/2 cognitive decline  Cognitive retraining, balance, safety awareness, modified bathing/dressing, family education    Mobility   supervision-min A, fluctuating cognition  downgraded to supervision overall due to cognitive decline  cognitive remediation, safety, R attention, R NMR, standing balance, activity tolerance, generalized strengthening, family education   Communication   max assist to respond to questions, perseverative, echolalic  min assist   functional communication   Safety/Cognition/ Behavioral Observations  max assist, significant decline in function ? UTI versus Keppra  supervision; downgraded   basic cognition    Pain   no complaint of pain  3  monitor for nonverbal cues of pain   Skin   bruising and/or edema to R orbital, face, neck, LUE, LLE, R foot pad near big toe  pt  will not develop additional skin breakdown  monitor for changes in skin integrity      *See Care Plan and progress notes for long and short-term goals.  Barriers to Discharge: Inconsistent sleep, ?UTI, cognition    Possible Resolutions to Barriers:  Therapies, empiric abx, head CT with expected evolution    Discharge  Planning/Teaching Needs:  Home with son and daughter in-law staying with for a short time. Concerned about pt's cognitive decline the past few days. MD is aware.      Team Discussion:  Pt has cognitively declined since Sunday-goals downgraded to supervision as a result. MD is aware and is treating for UTI and CT scan normal. Son concerned about this, he is here daily. MD adjusting does of keppra-decreasing. Wait and see if clears after treatment for UTI begins working.  Revisions to Treatment Plan:  Downgraded goals to supervision level   Continued Need for Acute Rehabilitation Level of Care: The patient requires daily medical management by a physician with specialized training in physical medicine and rehabilitation for the following conditions: Daily direction of a multidisciplinary physical rehabilitation program to ensure safe treatment while eliciting the highest outcome that is of practical value to the patient.: Yes Daily medical management of patient stability for increased activity during participation in an intensive rehabilitation regime.: Yes Daily analysis of laboratory values and/or radiology reports with any subsequent need for medication adjustment of medical intervention for : Neurological problems;Other  Waverley Krempasky, Gardiner Rhyme 09/20/2016, 4:07 PM

## 2016-09-20 NOTE — Progress Notes (Signed)
Subjective/Complaints: Pt sitting up in bed.  Head CT ordered yesterday, per family insistence.  Labs ordered as well.  Daughter at bedside.  Reviewed results with pt and family.  Pt remains stable having slept better last night than previous nights.   ROS: Denies N/V/D, no facial pain or HA, no CP or SOB  Objective: Vital Signs: Blood pressure (!) 148/52, pulse 71, temperature 97.8 F (36.6 C), temperature source Oral, resp. rate 18, SpO2 96 %. Ct Head Wo Contrast  Result Date: 09/19/2016 CLINICAL DATA:  Intracranial hemorrhage. EXAM: CT HEAD WITHOUT CONTRAST TECHNIQUE: Contiguous axial images were obtained from the base of the skull through the vertex without intravenous contrast. COMPARISON:  09/11/2016 FINDINGS: Brain: 2 cm right frontal lobe hemorrhage demonstrates expected mild interval decrease in density without significant change in size. Mild surrounding edema has minimally increased. Subdural hematoma along the falx has decreased in size and density. Small subdural hematoma over the left frontoparietal convexity demonstrates expected interval decreased density and measures up to 6 mm in thickness. Maximal thickness of this hematoma has not significantly changed from the prior study, though there has been some redistribution in the position of the blood products. There is a small amount of residual subarachnoid hemorrhage which has decreased in attenuation (most notable in the left parietal region). No definite residual intraventricular hemorrhage. There is no evidence of new intracranial hemorrhage, acute large territory infarct, or midline shift. The ventricles are unchanged in size. The basilar cisterns are patent. Vascular: Calcified atherosclerosis at the skullbase. Skull: No focal osseous lesion. Sinuses/Orbits: Right maxillary sinus and nasal bone fractures are again noted. There is mild right maxillary sinus mucosal thickening without residual layering fluid. Small bilateral mastoid  effusions. Prior bilateral cataract extraction. Other: Right periorbital and facial soft tissue hematoma is again noted. IMPRESSION: 1. Expected evolution of hemorrhagic right frontal lobe contusion. Minimally increased edema. No midline shift. 2. Evolving subdural hematomas over the left frontoparietal convexity and along the falx, stable to slightly decreased in size. 3. Resolving subarachnoid hemorrhage. 4. No new intracranial abnormality. Electronically Signed   By: Logan Bores M.D.   On: 09/19/2016 17:47   Results for orders placed or performed during the hospital encounter of 09/13/16 (from the past 72 hour(s))  Urinalysis, Routine w reflex microscopic (not at [facility])     Status: Abnormal   Collection Time: 09/19/16  6:44 PM  Result Value Ref Range   Color, Urine YELLOW YELLOW   APPearance CLOUDY (A) CLEAR   Specific Gravity, Urine 1.010 1.005 - 1.030   pH 6.0 5.0 - 8.0   Glucose, UA NEGATIVE NEGATIVE mg/dL   Hgb urine dipstick SMALL (A) NEGATIVE   Bilirubin Urine NEGATIVE NEGATIVE   Ketones, ur NEGATIVE NEGATIVE mg/dL   Protein, ur NEGATIVE NEGATIVE mg/dL   Nitrite POSITIVE (A) NEGATIVE   Leukocytes, UA LARGE (A) NEGATIVE  Urine microscopic-add on     Status: Abnormal   Collection Time: 09/19/16  6:44 PM  Result Value Ref Range   Squamous Epithelial / LPF 0-5 (A) NONE SEEN   WBC, UA TOO NUMEROUS TO COUNT 0 - 5 WBC/hpf   RBC / HPF 0-5 0 - 5 RBC/hpf   Bacteria, UA MANY (A) NONE SEEN  Basic metabolic panel     Status: Abnormal   Collection Time: 09/20/16  3:48 AM  Result Value Ref Range   Sodium 137 135 - 145 mmol/L   Potassium 4.6 3.5 - 5.1 mmol/L   Chloride 102 101 - 111  mmol/L   CO2 25 22 - 32 mmol/L   Glucose, Bld 119 (H) 65 - 99 mg/dL   BUN 25 (H) 6 - 20 mg/dL   Creatinine, Ser 1.15 (H) 0.44 - 1.00 mg/dL   Calcium 9.4 8.9 - 10.3 mg/dL   GFR calc non Af Amer 43 (L) >60 mL/min   GFR calc Af Amer 50 (L) >60 mL/min    Comment: (NOTE) The eGFR has been calculated using the  CKD EPI equation. This calculation has not been validated in all clinical situations. eGFR's persistently <60 mL/min signify possible Chronic Kidney Disease.    Anion gap 10 5 - 15  CBC with Differential/Platelet     Status: None   Collection Time: 09/20/16  3:48 AM  Result Value Ref Range   WBC 10.4 4.0 - 10.5 K/uL   RBC 4.20 3.87 - 5.11 MIL/uL   Hemoglobin 12.7 12.0 - 15.0 g/dL   HCT 37.8 36.0 - 46.0 %   MCV 90.0 78.0 - 100.0 fL   MCH 30.2 26.0 - 34.0 pg   MCHC 33.6 30.0 - 36.0 g/dL   RDW 14.0 11.5 - 15.5 %   Platelets 321 150 - 400 K/uL   Neutrophils Relative % 63 %   Neutro Abs 6.7 1.7 - 7.7 K/uL   Lymphocytes Relative 24 %   Lymphs Abs 2.5 0.7 - 4.0 K/uL   Monocytes Relative 9 %   Monocytes Absolute 0.9 0.1 - 1.0 K/uL   Eosinophils Relative 3 %   Eosinophils Absolute 0.3 0.0 - 0.7 K/uL   Basophils Relative 1 %   Basophils Absolute 0.1 0.0 - 0.1 K/uL     HEENT: Periorbital bruising and edema (improving).  Cardio: RRR, No JVD. Resp: CTA B/L and unlabored GI: BS positive and NT, ND Skin:   Bruise maxillary. Warm and dry.  Neuro: Alert, Oriented x3, some difficulty with specific date. Somewhat slowed, but answer most questions.  Motor: 4+/5 throughout  Musc/Skel:  No edema, no tenderness in extremities. Gen NAD. Vital signs reviewed.    Assessment/Plan: 1. Functional deficits secondary to TBI/SDH/SAH which require 3+ hours per day of interdisciplinary therapy in a comprehensive inpatient rehab setting. Physiatrist is providing close team supervision and 24 hour management of active medical problems listed below. Physiatrist and rehab team continue to assess barriers to discharge/monitor patient progress toward functional and medical goals. FIM: Function - Bathing Position: Shower Body parts bathed by patient: Right arm, Left arm, Chest, Abdomen, Front perineal area, Buttocks, Right upper leg, Left upper leg Body parts bathed by helper: Back Assist Level: Touching  or steadying assistance(Pt > 75%)  Function- Upper Body Dressing/Undressing What is the patient wearing?: Bra, Pull over shirt/dress Bra - Perfomed by patient: Thread/unthread right bra strap, Thread/unthread left bra strap, Hook/unhook bra (pull down sports bra) Bra - Perfomed by helper: Hook/unhook bra (pull down sports bra) Pull over shirt/dress - Perfomed by patient: Thread/unthread right sleeve, Thread/unthread left sleeve, Put head through opening, Pull shirt over trunk Assist Level: Set up, Supervision or verbal cues, More than reasonable time Set up : To obtain clothing/put away Function - Lower Body Dressing/Undressing What is the patient wearing?: Pants, Underwear, Socks, Shoes Position: Wheelchair/chair at sink Underwear - Performed by patient: Thread/unthread left underwear leg Underwear - Performed by helper: Thread/unthread right underwear leg, Pull underwear up/down Pants- Performed by patient: Thread/unthread left pants leg Pants- Performed by helper: Thread/unthread right pants leg, Pull pants up/down Socks - Performed by patient: Don/doff right sock,  Don/doff left sock Socks - Performed by helper: Don/doff right sock, Don/doff left sock Shoes - Performed by patient: Don/doff right shoe, Don/doff left shoe Shoes - Performed by helper: Don/doff left shoe, Don/doff right shoe Assist for footwear: Partial/moderate assist Assist for lower body dressing: Touching or steadying assistance (Pt > 75%)  Function - Toileting Toileting steps completed by patient: Performs perineal hygiene Toileting steps completed by helper: Adjust clothing prior to toileting, Adjust clothing after toileting Toileting Assistive Devices: Grab bar or rail Assist level: Supervision or verbal cues  Function - Air cabin crew transfer assistive device: Grab bar, Elevated toilet seat/BSC over toilet Assist level to toilet: Touching or steadying assistance (Pt > 75%) Assist level from toilet:  Supervision or verbal cues  Function - Chair/bed transfer Chair/bed transfer method: Ambulatory Chair/bed transfer assist level: Supervision or verbal cues Chair/bed transfer assistive device: Armrests, Walker Chair/bed transfer details: Verbal cues for technique, Verbal cues for precautions/safety, Verbal cues for safe use of DME/AE  Function - Locomotion: Wheelchair Will patient use wheelchair at discharge?: No Function - Locomotion: Ambulation Assistive device: Walker-rolling Max distance: 150 ft Assist level: Supervision or verbal cues Assist level: Supervision or verbal cues Assist level: Supervision or verbal cues Assist level: Supervision or verbal cues Assist level: Supervision or verbal cues  Function - Comprehension Comprehension: Auditory Comprehension assist level: Understands basic 50 - 74% of the time/ requires cueing 25 - 49% of the time  Function - Expression Expression: Verbal Expression assist level: Expresses basic 50 - 74% of the time/requires cueing 25 - 49% of the time. Needs to repeat parts of sentences.  Function - Social Interaction Social Interaction assist level: Interacts appropriately 50 - 74% of the time - May be physically or verbally inappropriate.  Function - Problem Solving Problem solving assist level: Solves basic 25 - 49% of the time - needs direction more than half the time to initiate, plan or complete simple activities  Function - Memory Memory assist level: Recognizes or recalls 25 - 49% of the time/requires cueing 50 - 75% of the time Patient normally able to recall (first 3 days only): Current season  Medical Problem List and Plan: 1.  Traumatic TBI /SDH/SAH as well as maxillofacial fractures secondary to syncopal event.  Cont CIR   Poor sleep +/- UTI likely contributing to progressive cognitive fatigue during day  Keppra decreased per family insistence for cognition  Head CT (per family insistence) reviewed from 10/24, expected  evolution 2.  DVT Prophylaxis/Anticoagulation: SCDs. Monitor for any signs of DVT 3. Pain Management: Flexeril as needed for muscle spasms as prior to admission. 4. Seizure prophylaxis. Keppra 500 mg twice a day, decreased to 250 BID on 10/23 per family insistance. EEG negative 5. Neuropsych: This patient is capable of making decisions on her own behalf. 6. Skin/Wound Care: Routine skin checks 7. Fluids/Electrolytes/Nutrition: Routine I&Os   Dietitian consult 8. CAD/NSTEMI status post stenting.   Cont aspirin 10/24   Plavix in 1 weeks, monitor for increasing bleed.   No chest pain or shortness of breath 9. Hypertension.   Lisinopril 10 mg daily  Lopressor 12.5 mg twice a day, increased to 25.   Improving, will cont to monitor Vitals:   09/19/16 1535 09/20/16 0529  BP: (!) 115/50 (!) 148/52  Pulse: 67 71  Resp: 18 18  Temp: 97.4 F (36.3 C) 97.8 F (36.6 C)  10. Hypothyroidism. Synthroid. 11. Hyperlipidemia.Zetia/Lipitor 12. Sleep disturbance  Resulting in fatigue and increasing cognitive difficulties as day progresses  Trazodone scheduled, increased to 100 10/24, improved with increased dose 13. UTI  UA with bac, Ucx pending  Empiric macrobid started 10/25 14. AKI  Cr. 1.15 on 10/25  Encourage fluid intake  LOS (Days) 7 A FACE TO FACE EVALUATION WAS PERFORMED  Toneisha Savary Lorie Phenix 09/20/2016, 8:18 AM

## 2016-09-20 NOTE — Progress Notes (Signed)
Social Work Elease Hashimoto, LCSW Social Worker Signed   Patient Care Conference Date of Service: 09/20/2016  4:07 PM      Hide copied text Hover for attribution information Inpatient RehabilitationTeam Conference and Plan of Care Update Date: 09/20/2016   Time: 2:15 PM      Patient Name: Kristin Shaffer      Medical Record Number: [id number]  Date of Birth: 1931-10-30 Sex: Female         Room/Bed: 4M03C/4M03C-01 Payor Info: Payor: MEDICARE / Plan: MEDICARE PART A AND B / Product Type: *No Product type* /     Admitting Diagnosis: TBI  Admit Date/Time:  09/13/2016  3:42 PM Admission Comments: No comment available    Primary Diagnosis:  <principal problem not specified> Principal Problem: <principal problem not specified>       Patient Active Problem List    Diagnosis Date Noted  . Intracranial hemorrhage ([place])    . AKI (acute kidney injury) ([place])    . Acute lower UTI    . Sleep disturbance    . 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 Labile blood pressure    . Fall 09/13/2016  . Syncope    . Closed fracture of maxillary sinus ([place])    . Subarachnoid hemorrhage ([place])    . Subdural hematoma ([place])    . Seizure prophylaxis    . Coronary artery disease involving coronary bypass graft of native heart without angina pectoris    . Benign essential HTN    . Facial fracture ([place]) 09/09/2016  . TBI (traumatic brain injury) ([place]) 09/09/2016  . Pulmonary fibrosis ([place]) 05/22/2016  . DOE (dyspnea on exertion) 04/14/2016  . Right flank pain 04/14/2016  . Pulmonary hypertension 04/14/2016  . Muscle spasm of back 09/15/2015  . Myalgia 03/23/2015  . Intermediate coronary syndrome ([place]) 12/07/2013  . Hyperlipidemia 12/03/2013  . Presence of drug coated stent in right coronary artery 03/22/2013  . NSTEMI (non-ST elevated myocardial infarction) ([place]) 03/21/2013  . Unstable angina 03/20/2013  . Hypertensive urgency -Accelerated Hypertension  03/20/2013  . GERD (gastroesophageal reflux disease) 03/20/2013  . Hypothyroidism 03/20/2013      Expected Discharge Date: Expected Discharge Date: 09/27/16   Team Members Present: Physician leading conference: Dr. Delice Lesch Social Worker Present: Ovidio Kin, LCSW Nurse Present: Other (comment) Drucie Opitz) PT Present: Carney Living, PT OT Present: Other (comment) Grayland Ormond DOe-OT) SLP Present: Windell Moulding, SLP PPS Coordinator present : Daiva Nakayama, RN, CRRN       Current Status/Progress Goal Weekly Team Focus  Medical   Traumatic TBI /SDH/SAH as well as maxillofacial fractures secondary to syncopal event, with waxing/waning symptoms  Improve mobility, safety, cognition, sleep, UTI  See above   Bowel/Bladder   continent of bowel and bladder; LBM 10/23  pt to remain continent of bowel and bladder  monitor for changes in continence   Swallow/Nutrition/ Hydration             ADL's   Mod A, decline in cognition,  Goals downgraded to Min A/supervision 2/2 cognitive decline  Cognitive retraining, balance, safety awareness, modified bathing/dressing, family education    Mobility   supervision-min A, fluctuating cognition  downgraded to supervision overall due to cognitive decline  cognitive remediation, safety, R attention, R NMR, standing balance, activity tolerance, generalized strengthening, family education   Communication   max assist to respond to questions, perseverative, echolalic  min assist   functional communication   Safety/Cognition/ Behavioral  Observations max assist, significant decline in function ? UTI versus Keppra  supervision; downgraded   basic cognition    Pain   no complaint of pain  3  monitor for nonverbal cues of pain   Skin   bruising and/or edema to R orbital, face, neck, LUE, LLE, R foot pad near big toe  pt will not develop additional skin breakdown  monitor for changes in skin integrity       *See Care Plan and progress notes for long and  short-term goals.   Barriers to Discharge: Inconsistent sleep, ?UTI, cognition   Possible Resolutions to Barriers:  Therapies, empiric abx, head CT with expected evolution   Discharge Planning/Teaching Needs:  Home with son and daughter in-law staying with for a short time. Concerned about pt's cognitive decline the past few days. MD is aware.      Team Discussion:  Pt has cognitively declined since Sunday-goals downgraded to supervision as a result. MD is aware and is treating for UTI and CT scan normal. Son concerned about this, he is here daily. MD adjusting does of keppra-decreasing. Wait and see if clears after treatment for UTI begins working.  Revisions to Treatment Plan:  Downgraded goals to supervision level    Continued Need for Acute Rehabilitation Level of Care: The patient requires daily medical management by a physician with specialized training in physical medicine and rehabilitation for the following conditions: Daily direction of a multidisciplinary physical rehabilitation program to ensure safe treatment while eliciting the highest outcome that is of practical value to the patient.: Yes Daily medical management of patient stability for increased activity during participation in an intensive rehabilitation regime.: Yes Daily analysis of laboratory values and/or radiology reports with any subsequent need for medication adjustment of medical intervention for : Neurological problems;Other   Elease Hashimoto 09/20/2016, 4:07 PM       Patient ID: Yevonne Pax, female   DOB: [birth year], 82 y.o.   MRN: [id number]

## 2016-09-20 NOTE — Progress Notes (Signed)
Patient restless at beginning of shift.  Kicking off blankets, attempting to get out of bed unassisted.  Medicated at 2317 with Acetaminophen for complaint of "back hurting".  Patient then with decreased restlessness and sleeping soundly.  Toileted this morning without incident.  Remains continent.  Sleeping quietly at this time without sign of discomfort.    Fredna Dow M

## 2016-09-20 NOTE — Progress Notes (Signed)
Physical Therapy Session Note  Patient Details  Name: ANGELISE ZBOROWSKI MRN: [id number] Date of Birth: 07-14-1931  Today's Date: 09/20/2016 PT Individual Time: 1126-1158 PT Individual Time Calculation (min): 32 min    Short Term Goals: Week 1:  PT Short Term Goal 1 (Week 1): = LTGs due to anticpated LOS    Therapy Documentation Precautions:  Precautions Precautions: Fall Precaution Comments: hx of syncope Restrictions Weight Bearing Restrictions: No   Patient received seated in recliner chair. Patient reports fatigue from previous sessions.   Nu-step 10 minutes level 4  Patient negotiated 16 steps with bilateral handrails close supervision. Patient performed stairs with a step to pattern. Verbal cues for pacing and hand placement on rail.   Patient ambulated 200 feet with close supervision with RW to and from room and therapy gym. Verbal cues for upright posture, environmental awareness and obstacle negotiation.   Standing there ex: B heel raises 3x10 Mini squats 20x.  Patient not responsive to simple questions. Able to follow one step instructions with minimal cues. Patient able to navigate from therapy gym to room with cues for topographical orientation. Patient however appears to having difficulty engaging in therapy as well as expression of needs. Patient returned to room at end of session with all needs sitting in the recliner and quick release belt engaged.     See Function Navigator for Current Functional Status.   Therapy/Group: Individual Therapy  Retta Diones 09/20/2016, 12:06 PM

## 2016-09-20 NOTE — Progress Notes (Signed)
Social Work Patient ID: Kristin Shaffer, female   DOB: Sep 13, 1931, 82 y.o.   MRN: [id number]  Met with pt and son who was here to inform team conference goals-supervision level and target discharge date 11/1. Son concerned about his Mom's mental decline and has spoken with the MD and is aware being treated for UTI and Keppra dose being decreased. The hope is she will return to how she was doing upon day of Evaluation and prior to Sunday when she started declining. He will be there with her upon discharge along with his wife, but once she does well he hopes she will not need 24 hr supervision. Will work on discharge Needs, son plans to be here to learn her care.

## 2016-09-20 NOTE — Progress Notes (Signed)
Speech Language Pathology Daily Session Note  Patient Details  Name: Kristin Shaffer MRN: [id number] Date of Birth: 25-Mar-1931  Today's Date: 09/20/2016 SLP Individual Time: 0830-0930 SLP Individual Time Calculation (min): 60 min   Short Term Goals: Week 1: SLP Short Term Goal 1 (Week 1): STG=LTG due to ELOS   Skilled Therapeutic Interventions:   Skilled treatment session focused on addressing cognitive-linguistic goals. SLP facilitated session by providing extra time and choice of three for orientation questions, patient then able to accurately identify information.  Patient transferred to chair with Min verbal cues for recall and utilization of hand placement.  Patient participated in a structured categorical naming task with Max encouragement to utilize word finding strategies and Mod assist sentence completion and phonemic cues for naming accuracy.  Spontaneous verbal expression characterized by word-phrase level today; however, minimal.  Aware of +UTI and hopeful that with treatment long term goals will be achievable.     Function:   Cognition Comprehension Comprehension assist level: Understands basic 50 - 74% of the time/ requires cueing 25 - 49% of the time  Expression   Expression assist level: Expresses basic 25 - 49% of the time/requires cueing 50 - 75% of the time. Uses single words/gestures.  Social Interaction Social Interaction assist level: Interacts appropriately 50 - 74% of the time - May be physically or verbally inappropriate.  Problem Solving Problem solving assist level: Solves basic 25 - 49% of the time - needs direction more than half the time to initiate, plan or complete simple activities  Memory Memory assist level: Recognizes or recalls 50 - 74% of the time/requires cueing 25 - 49% of the time    Pain Pain Assessment Pain Assessment: No/denies pain  Therapy/Group: Individual Therapy  Carmelia Roller., CCC-SLP [id number]  [place] 09/20/2016,  4:37 PM

## 2016-09-21 ENCOUNTER — Inpatient Hospital Stay (HOSPITAL_COMMUNITY): Payer: Medicare Other | Admitting: Occupational Therapy

## 2016-09-21 ENCOUNTER — Inpatient Hospital Stay (HOSPITAL_COMMUNITY): Payer: Medicare Other

## 2016-09-21 ENCOUNTER — Inpatient Hospital Stay (HOSPITAL_COMMUNITY): Payer: Medicare Other | Admitting: Physical Therapy

## 2016-09-21 ENCOUNTER — Inpatient Hospital Stay (HOSPITAL_COMMUNITY): Payer: Medicare Other | Admitting: Speech Pathology

## 2016-09-21 DIAGNOSIS — Z298 Encounter for other specified prophylactic measures: Secondary | ICD-10-CM

## 2016-09-21 DIAGNOSIS — I214 Non-ST elevation (NSTEMI) myocardial infarction: Secondary | ICD-10-CM

## 2016-09-21 NOTE — Progress Notes (Signed)
Occupational Therapy Session Note  Patient Details  Name: Kristin Shaffer MRN: [id number] Date of Birth: 03/22/1931  Today's Date: 09/21/2016 OT Individual Time: 0900-0930 OT Individual Time Calculation (min): 30 min     Short Term Goals: Week 1:  OT Short Term Goal 1 (Week 1): LTG=STG 2/2 estimated short LOS  Skilled Therapeutic Interventions/Progress Updates:   1:1 cognitive retraining. Pt ambulated to the gym with close supervision with RW to a mildly distracting environment to work on cogntive table top tasks that included following written directions, sequencing a task with 4 steps, completing category list, and orientation. Pt required the most level of Assistance with sequencing.  All other tasks with min A with more than reasonable amt of time. Pt did perform better with written instructions instead of verbal. Pt returned to room and resting comfortably in recliner with quick release belt.   Therapy Documentation Precautions:  Precautions Precautions: Fall Precaution Comments: hx of syncope Restrictions Weight Bearing Restrictions: No Pain: Pain Assessment Pain Assessment: Faces Pain Score: 0-No pain Faces Pain Scale: No hurt  See Function Navigator for Current Functional Status.   Therapy/Group: Individual Therapy  Willeen Cass [facility] 09/21/2016, 9:50 AM

## 2016-09-21 NOTE — Progress Notes (Signed)
Physical Therapy Weekly Progress Note  Patient Details  Name: Kristin Shaffer MRN: [id number] Date of Birth: 1931-07-17  Beginning of progress report period: September 14, 2016 End of progress report period: September 21, 2016  Today's Date: 09/21/2016 PT Individual Time: 0805-0858 PT Individual Time Calculation (min): 53 min   Patient has met 2 of 11 long term goals. Patient currently requires close supervision and up to min A from low surfaces for functional mobility with cues for safe use of RW. Since evaluation, patient has demonstrated a decline in cognitive function with impaired awareness, impaired attention, RUE coordination/weakness, R inattention, impaired functional communication, impaired memory, impaired sequencing, impaired problem solving, and decreased safety awareness. Family education ongoing.   Patient continues to demonstrate the following deficits: muscle weakness, decreased cardiorespiratory endurance, decreased standing balance, decreased postural control, decreased balance strategies, decreased safety awareness, decreased awareness, decreased coordination, impaired memory and therefore will continue to benefit from skilled PT intervention to enhance overall performance with activity tolerance, balance, postural control, ability to compensate for deficits, functional use of  right upper extremity, attention, awareness and coordination.  See Patient's Care Plan for progression toward long term goals.  Patient not progressing toward long term goals.  See goal revision.  Plan of care revisions: Goals downgraded to supervision overall due to fluctuating cognition and memory and awareness goals added.   Skilled Therapeutic Interventions/Progress Updates:   Patient in recliner with son present upon arrival. Patient with minimal verbalizations this session and did not answer any questions or initiate conversation. Patient toileted and performed hand hygiene, oral hygiene, and brushing  hair standing at sink with supervision using RW with mod cues for sequencing grooming tasks. Patient's family selected clothing and patient required max-total multimodal cues for safety awareness, sequencing tasks, and orienting clothing with no awareness of errors. Patient with increased shortness of breath with dressing tasks requiring cues for rest breaks. Sit <> stand with RW with patient following cues for safe hand placement more consistently this date with supervision overall and min A from low bed. Gait using RW to and from gym with supervision. Patient donned pillowcases within reasonable amount of time using BUE, improved from yesterday. Patient engaged in seated tabletop 24-piece puzzle with total A for organization, problem solving, and error recognition with patient following commands less than 10% of available opportunities. Patient left sitting in recliner with quick release belt and call bell in lap.   Therapy Documentation Precautions: Precautions Precautions: Fall Precaution Comments: hx of syncope Restrictions Weight Bearing Restrictions: No Pain: Pain Assessment Pain Assessment: Faces Pain Score: 0-No pain Faces Pain Scale: No hurt   See Function Navigator for Current Functional Status.  Therapy/Group: Individual Therapy  [name] 09/21/2016, 9:04 AM

## 2016-09-21 NOTE — Progress Notes (Signed)
Occupational Therapy Weekly Progress Note  Patient Details  Name: Kristin Shaffer MRN: [id number] Date of Birth: 10/07/1931  Beginning of progress report period: September 14, 2016 End of progress report period: September 21, 2016  Today's Date: 09/21/2016  Session 1 OT Individual Time: 1000-1100 OT Individual Time Calculation (min): 60 min   Session 2 OT Individual Time: 1345-1415 OT Individual Time Calculation (min): 30 min   Patient has met 0 of 11 long term goals. Short term goals not set due to estimated length of stay. Pt currently requires overall Min A to supervision with Mod/Max cues for sequencing, transitions, and safety awareness during ADLs/self-care tasks. Pt with cognitive decline since evaluation as evident by diminished awareness, R inattention, R UE coordination deficits, difficulty problem solving simple tasks, and decreased communication. Patient continues to demonstrate the following deficits: muscle weakness, decreased coordination, decreased attention to right, decreased initiation, decreased attention, decreased awareness, decreased problem solving, decreased safety awareness, decreased memory and delayed processing and decreased standing balance and decreased balance strategies and therefore will continue to benefit from skilled OT intervention to enhance overall performance with BADL.  See Patient's Care Plan for progression toward long term goals.  Patient not progressing toward long term goals.  See goal revision..  Plan of care revisions: Goals downgraded to supervision at this time. .   Skilled Therapeutic Interventions/Progress Updates:    Session 1 1:1 OT session focused on problem solving, sequencing, multi-step tasks, and sorting within functional tasks. Pt sorted 3 different kitchen utensils with written labels and increased time. Pt/therapist collaboration on ingredients needed to make familiar food. Mod instructional cues to complete 7 item grocery list in  moderate distracting environment. OT provided pt with 5 step written recipe. Difficulty w/ multi-step task, OT graded activity by showing 1 step at a time; improved ability to follow written directions with less distractions. Pt ambulated back to room and was left seated in recliner with safety belt in place and son present.  Session 2 OT session focused on cognitive retraining, memory, sequencing, and transitions. Pt ambulated to therapy apartment with RW and close supervision. In minimally distracting environment, pt able to sequence and complete familiar household tasks with min A overall. Pt perseverative on dish washing task, but able to transition to the next step with increased time and min questioning cues. Pt returned to room at end of session and left seated in recliner with safety belt on and family present.   Therapy Documentation Precautions:  Precautions Precautions: Fall Precaution Comments: hx of syncope Restrictions Weight Bearing Restrictions: No Pain: Pain Assessment Pain Assessment: Faces Pain Score: 0-No pain Faces Pain Scale: No hurt ADL: ADL ADL Comments: See functional navigator  See Function Navigator for Current Functional Status.   Therapy/Group: Individual Therapy  [name] 09/21/2016, 3:39 PM

## 2016-09-21 NOTE — Progress Notes (Addendum)
Physical Therapy Note  Patient Details  Name: Kristin Shaffer MRN: [id number] Date of Birth: July 25, 1931 Today's Date: 09/21/2016  1530-1600, 30 min individual tx Pain: none reported  Sit> stand from recliner with close supervision to RW, safely.  Gait iwht RW to/from Ortho gym with close supervision.  neuromuscular re-education via forced use, cues for balance strategies standing on compliant Airex mat, statically with min assist, during heel/toe raises with mod assist. Poor eccentric control noted stand> sit. Static standing in high squatting position during folding laundry x 4 minutes.  Blocked sit>< stand practice without use of UEs, from low mat, with improving eccentric control. Seated heel raises for calf activation, 20 x 1. Pt left resting in bed with alarm set and all needs within reach.   Kiernan Atkerson 09/21/2016, 3:44 PM

## 2016-09-21 NOTE — Progress Notes (Signed)
Subjective/Complaints: Pt sitting up in her chair this AM.  She states she slept well.  Answers questions appropriately and would like to know when she can go home.  No family in room today.  No reported issues with sleep.  ROS: Denies N/V/D, no facial pain or HA, no CP or SOB  Objective: Vital Signs: Blood pressure (!) 134/50, pulse 65, temperature 97.6 F (36.4 C), temperature source Oral, resp. rate 18, weight 78.5 kg (173 lb 1 oz), SpO2 98 %. Ct Head Wo Contrast  Result Date: 09/19/2016 CLINICAL DATA:  Intracranial hemorrhage. EXAM: CT HEAD WITHOUT CONTRAST TECHNIQUE: Contiguous axial images were obtained from the base of the skull through the vertex without intravenous contrast. COMPARISON:  09/11/2016 FINDINGS: Brain: 2 cm right frontal lobe hemorrhage demonstrates expected mild interval decrease in density without significant change in size. Mild surrounding edema has minimally increased. Subdural hematoma along the falx has decreased in size and density. Small subdural hematoma over the left frontoparietal convexity demonstrates expected interval decreased density and measures up to 6 mm in thickness. Maximal thickness of this hematoma has not significantly changed from the prior study, though there has been some redistribution in the position of the blood products. There is a small amount of residual subarachnoid hemorrhage which has decreased in attenuation (most notable in the left parietal region). No definite residual intraventricular hemorrhage. There is no evidence of new intracranial hemorrhage, acute large territory infarct, or midline shift. The ventricles are unchanged in size. The basilar cisterns are patent. Vascular: Calcified atherosclerosis at the skullbase. Skull: No focal osseous lesion. Sinuses/Orbits: Right maxillary sinus and nasal bone fractures are again noted. There is mild right maxillary sinus mucosal thickening without residual layering fluid. Small bilateral  mastoid effusions. Prior bilateral cataract extraction. Other: Right periorbital and facial soft tissue hematoma is again noted. IMPRESSION: 1. Expected evolution of hemorrhagic right frontal lobe contusion. Minimally increased edema. No midline shift. 2. Evolving subdural hematomas over the left frontoparietal convexity and along the falx, stable to slightly decreased in size. 3. Resolving subarachnoid hemorrhage. 4. No new intracranial abnormality. Electronically Signed   By: Logan Bores M.D.   On: 09/19/2016 17:47   Results for orders placed or performed during the hospital encounter of 09/13/16 (from the past 72 hour(s))  Urinalysis, Routine w reflex microscopic (not at [facility])     Status: Abnormal   Collection Time: 09/19/16  6:44 PM  Result Value Ref Range   Color, Urine YELLOW YELLOW   APPearance CLOUDY (A) CLEAR   Specific Gravity, Urine 1.010 1.005 - 1.030   pH 6.0 5.0 - 8.0   Glucose, UA NEGATIVE NEGATIVE mg/dL   Hgb urine dipstick SMALL (A) NEGATIVE   Bilirubin Urine NEGATIVE NEGATIVE   Ketones, ur NEGATIVE NEGATIVE mg/dL   Protein, ur NEGATIVE NEGATIVE mg/dL   Nitrite POSITIVE (A) NEGATIVE   Leukocytes, UA LARGE (A) NEGATIVE  Urine microscopic-add on     Status: Abnormal   Collection Time: 09/19/16  6:44 PM  Result Value Ref Range   Squamous Epithelial / LPF 0-5 (A) NONE SEEN   WBC, UA TOO NUMEROUS TO COUNT 0 - 5 WBC/hpf   RBC / HPF 0-5 0 - 5 RBC/hpf   Bacteria, UA MANY (A) NONE SEEN  Basic metabolic panel     Status: Abnormal   Collection Time: 09/20/16  3:48 AM  Result Value Ref Range   Sodium 137 135 - 145 mmol/L   Potassium 4.6 3.5 - 5.1 mmol/L  Chloride 102 101 - 111 mmol/L   CO2 25 22 - 32 mmol/L   Glucose, Bld 119 (H) 65 - 99 mg/dL   BUN 25 (H) 6 - 20 mg/dL   Creatinine, Ser 1.15 (H) 0.44 - 1.00 mg/dL   Calcium 9.4 8.9 - 10.3 mg/dL   GFR calc non Af Amer 43 (L) >60 mL/min   GFR calc Af Amer 50 (L) >60 mL/min    Comment: (NOTE) The eGFR has been calculated  using the CKD EPI equation. This calculation has not been validated in all clinical situations. eGFR's persistently <60 mL/min signify possible Chronic Kidney Disease.    Anion gap 10 5 - 15  CBC with Differential/Platelet     Status: None   Collection Time: 09/20/16  3:48 AM  Result Value Ref Range   WBC 10.4 4.0 - 10.5 K/uL   RBC 4.20 3.87 - 5.11 MIL/uL   Hemoglobin 12.7 12.0 - 15.0 g/dL   HCT 37.8 36.0 - 46.0 %   MCV 90.0 78.0 - 100.0 fL   MCH 30.2 26.0 - 34.0 pg   MCHC 33.6 30.0 - 36.0 g/dL   RDW 14.0 11.5 - 15.5 %   Platelets 321 150 - 400 K/uL   Neutrophils Relative % 63 %   Neutro Abs 6.7 1.7 - 7.7 K/uL   Lymphocytes Relative 24 %   Lymphs Abs 2.5 0.7 - 4.0 K/uL   Monocytes Relative 9 %   Monocytes Absolute 0.9 0.1 - 1.0 K/uL   Eosinophils Relative 3 %   Eosinophils Absolute 0.3 0.0 - 0.7 K/uL   Basophils Relative 1 %   Basophils Absolute 0.1 0.0 - 0.1 K/uL     HEENT: Periorbital bruising and edema (improving).  Cardio: RRR, No JVD. Resp: CTA B/L and unlabored GI: BS positive and NT, ND Skin:   Bruise maxillary, improving. Warm and dry.  Neuro: Alert and Oriented x3, continues to have some difficulty with specific date. Somewhat slowed, but answer most questions.  Motor: 4+/5 throughout  Musc/Skel:  No edema, no tenderness in extremities. Gen NAD. Vital signs reviewed.    Assessment/Plan: 1. Functional deficits secondary to TBI/SDH/SAH which require 3+ hours per day of interdisciplinary therapy in a comprehensive inpatient rehab setting. Physiatrist is providing close team supervision and 24 hour management of active medical problems listed below. Physiatrist and rehab team continue to assess barriers to discharge/monitor patient progress toward functional and medical goals. FIM: Function - Bathing Position: Shower Body parts bathed by patient: Right arm, Left arm, Chest, Abdomen, Front perineal area, Buttocks, Right upper leg, Left upper leg Body parts bathed  by helper: Back Assist Level: Touching or steadying assistance(Pt > 75%)  Function- Upper Body Dressing/Undressing What is the patient wearing?: Bra, Pull over shirt/dress Bra - Perfomed by patient: Thread/unthread right bra strap, Thread/unthread left bra strap Bra - Perfomed by helper: Hook/unhook bra (pull down sports bra) Pull over shirt/dress - Perfomed by patient: Thread/unthread right sleeve, Thread/unthread left sleeve, Put head through opening, Pull shirt over trunk Assist Level: Touching or steadying assistance(Pt > 75%), Set up Set up : To obtain clothing/put away Function - Lower Body Dressing/Undressing What is the patient wearing?: Pants, Socks, Shoes, Underwear Position: Wheelchair/chair at sink Underwear - Performed by patient: Thread/unthread left underwear leg, Pull underwear up/down Underwear - Performed by helper: Thread/unthread right underwear leg Pants- Performed by patient: Pull pants up/down, Thread/unthread left pants leg Pants- Performed by helper: Thread/unthread right pants leg Socks - Performed by patient: Don/doff  right sock, Don/doff left sock Socks - Performed by helper: Don/doff left sock, Don/doff right sock Shoes - Performed by patient: Don/doff right shoe, Don/doff left shoe Shoes - Performed by helper: Don/doff left shoe, Don/doff right shoe Assist for footwear: Partial/moderate assist Assist for lower body dressing: Touching or steadying assistance (Pt > 75%)  Function - Toileting Toileting steps completed by patient: Performs perineal hygiene, Adjust clothing after toileting, Adjust clothing prior to toileting Toileting steps completed by helper: Adjust clothing prior to toileting, Adjust clothing after toileting Toileting Assistive Devices: Grab bar or rail Assist level: Touching or steadying assistance (Pt.75%)  Function - Air cabin crew transfer assistive device: Walker, Grab bar Assist level to toilet: Supervision or verbal  cues Assist level from toilet: Supervision or verbal cues  Function - Chair/bed transfer Chair/bed transfer method: Ambulatory Chair/bed transfer assist level: Supervision or verbal cues Chair/bed transfer assistive device: Armrests, Walker Chair/bed transfer details: Verbal cues for technique, Verbal cues for precautions/safety, Verbal cues for safe use of DME/AE  Function - Locomotion: Wheelchair Will patient use wheelchair at discharge?: No Function - Locomotion: Ambulation Assistive device: Walker-rolling Max distance: 150 ft Assist level: Supervision or verbal cues Assist level: Supervision or verbal cues Assist level: Supervision or verbal cues Assist level: Supervision or verbal cues Assist level: Supervision or verbal cues  Function - Comprehension Comprehension: Auditory Comprehension assist level: Understands basic 50 - 74% of the time/ requires cueing 25 - 49% of the time  Function - Expression Expression: Verbal Expression assist level: Expresses basic 25 - 49% of the time/requires cueing 50 - 75% of the time. Uses single words/gestures.  Function - Social Interaction Social Interaction assist level: Interacts appropriately 50 - 74% of the time - May be physically or verbally inappropriate.  Function - Problem Solving Problem solving assist level: Solves basic 25 - 49% of the time - needs direction more than half the time to initiate, plan or complete simple activities  Function - Memory Memory assist level: Recognizes or recalls 50 - 74% of the time/requires cueing 25 - 49% of the time Patient normally able to recall (first 3 days only): Current season, That he or she is in a hospital  Medical Problem List and Plan: 1.  Traumatic TBI /SDH/SAH as well as maxillofacial fractures secondary to syncopal event.  Cont CIR   Poor sleep +/- UTI likely contributing to progressive cognitive fatigue during day  Keppra decreased per family insistence for cognition.  Will  speak to Neurosurg regarding duration of treatment.  Head CT (per family insistence) reviewed from 10/24, expected evolution 2.  DVT Prophylaxis/Anticoagulation: SCDs. Monitor for any signs of DVT 3. Pain Management: Flexeril as needed for muscle spasms as prior to admission. 4. Seizure prophylaxis. Keppra 500 mg twice a day, decreased to 250 BID on 10/23 per family insistance. EEG negative 5. Neuropsych: This patient is capable of making decisions on her own behalf. 6. Skin/Wound Care: Routine skin checks 7. Fluids/Electrolytes/Nutrition: Routine I&Os   Dietitian consult 8. CAD/NSTEMI status post stenting.   Cont aspirin 10/24   Plavix in 1 weeks, monitor for increasing bleed.   No chest pain or shortness of breath 9. Hypertension.   Lisinopril 10 mg daily  Lopressor 12.5 mg twice a day, increased to 25.   Improving, will cont to monitor Vitals:   09/20/16 1447 09/21/16 0537  BP: (!) 147/55 (!) 134/50  Pulse: 67 65  Resp: 18 18  Temp: 97.9 F (36.6 C) 97.6 F (36.4 C)  10. Hypothyroidism. Synthroid. 11. Hyperlipidemia.Zetia/Lipitor 12. Sleep disturbance  Resulting in fatigue and increasing cognitive difficulties as day progresses  Trazodone scheduled, increased to 100 10/24, improved with increased dose 13. UTI  UA with bac, Ucx remains pending  Empiric macrobid started 10/25 14. AKI  Cr. 1.15 on 10/25  Encourage fluid intake  LOS (Days) 8 A FACE TO FACE EVALUATION WAS PERFORMED   Lorie Phenix 09/21/2016, 7:34 AM

## 2016-09-21 NOTE — Progress Notes (Signed)
Speech Language Pathology Weekly Progress and Session Note  Patient Details  Name: Kristin Shaffer MRN: [id number] Date of Birth: [birth year]  Beginning of progress report period: September 14, 2016 End of progress report period: September 21, 2016  Today's Date: 09/21/2016 SLP Individual Time: [id number] SLP Individual Time Calculation (min): 45 min   Short Term Goals: Week 1: SLP Short Term Goal 1 (Week 1): STG=LTG due to ELOS  SLP Short Term Goal 1 - Progress (Week 1): Revised due to lack of progress    New Short Term Goals: Week 2: SLP Short Term Goal 1 (Week 2): STG=LTG due to ELOS   Weekly Progress Updates: Patient's goals have been revised due to lack of progress towards current goals. Currently pt requires Max A verbal cues for basic cognitive tasks such as orientation, simple problem solving, awareness, recall of new information and functional communication. Pt would benefit from continues skilled SLP intervention to maximize functional independence prior to discharge with 24 hour supervision.    Intensity: Minumum of 1-2 x/day, 30 to 90 minutes Frequency: 3 to 5 out of 7 days Duration/Length of Stay: 7 days Treatment/Interventions: Cognitive remediation/compensation;Cueing hierarchy;Functional tasks;Internal/external aids;Patient/family education   Daily Session  Skilled Therapeutic Interventions: Skilled treatment focused on cognitive goals. SLP facilitated session by providing Max A verbal cues for recall of orientation information. Multiple choices were not helpful with recall of information related to time. Pt required Max A verbal cues for problem solving basic daily situations.  Pt only spoke when addressed by SLP and was not able to engage in any word finding strategies this session. When speaking, her responses were limited to word level. Pt returned to room, tranferred to recliner and left with son present in room.        Function:     Cognition Comprehension  Comprehension assist level: Understands basic 50 - 74% of the time/ requires cueing 25 - 49% of the time  Expression   Expression assist level: Expresses basis less than 25% of the time/requires cueing >75% of the time.  Social Interaction Social Interaction assist level: Interacts appropriately 25 - 49% of time - Needs frequent redirection.  Problem Solving Problem solving assist level: Solves basic 25 - 49% of the time - needs direction more than half the time to initiate, plan or complete simple activities  Memory Memory assist level: Recognizes or recalls 50 - 74% of the time/requires cueing 25 - 49% of the time   General    Pain Pain Assessment Pain Assessment: Faces Pain Score: 0-No pain Faces Pain Scale: No hurt  Therapy/Group: Individual Therapy  Tanuj Mullens 09/21/2016, 3:47 PM

## 2016-09-22 ENCOUNTER — Inpatient Hospital Stay (HOSPITAL_COMMUNITY): Payer: Medicare Other | Admitting: Physical Therapy

## 2016-09-22 ENCOUNTER — Inpatient Hospital Stay (HOSPITAL_COMMUNITY): Payer: Medicare Other | Admitting: Speech Pathology

## 2016-09-22 ENCOUNTER — Inpatient Hospital Stay (HOSPITAL_COMMUNITY): Payer: Medicare Other | Admitting: Occupational Therapy

## 2016-09-22 NOTE — Progress Notes (Signed)
Discuss with cardiology services in regards to planning for 30 day event monitor on discharge. This will be completed prior to her discharge home

## 2016-09-22 NOTE — Progress Notes (Signed)
Speech Language Pathology Daily Session Notes  Patient Details  Name: Kristin Shaffer MRN: [id number] Date of Birth: April 27, 1931  Today's Date: 09/22/2016  Session 1: SLP Individual Time: 0830-0900 SLP Individual Time Calculation (min): 30 min  Session 2: SLP Individual Time: 1330-1410 SLP Individual Time Calculation (min): 40 min   Short Term Goals: Week 2: SLP Short Term Goal 1 (Week 2): STG=LTG due to ELOS  Skilled Therapeutic Interventions:   Session 1: Skilled treatment session focused on cognitive-linguistic goals. SLP facilitated session by providing Min A question cues for problem solving with 4 step picture sequencing cards and to verbally describe the actions within the cards at the phrase level. Patient with intermittent hesitation but did not demonstrate any difficulty with word-finding. Patient left upright in bed with all needs within reach. Continue with current plan of care.   Session 2: Skilled treatment session focused on cognitive-linguistic goals. SLP facilitated session by providing Min A question cues for problem solving with 6 step picture sequencing cards and to verbally describe the actions within the cards at the phrase level. Patient with 1 instance of word-finding difficulty in which patient required Min A phonemic cues. Patient with increased ability to participate in a conversation for ~4 turns. Patient ambulated back to her room from the SLP office with the RW with supervision. Patient left upright in recliner with family present. Continue with current plan of care.   Function:   Cognition Comprehension Comprehension assist level: Understands basic 75 - 89% of the time/ requires cueing 10 - 24% of the time  Expression   Expression assist level: Expresses basic 50 - 74% of the time/requires cueing 25 - 49% of the time. Needs to repeat parts of sentences.  Social Interaction Social Interaction assist level: Interacts appropriately 50 - 74% of the time - May  be physically or verbally inappropriate.  Problem Solving Problem solving assist level: Solves basic 25 - 49% of the time - needs direction more than half the time to initiate, plan or complete simple activities  Memory Memory assist level: Recognizes or recalls 25 - 49% of the time/requires cueing 50 - 75% of the time    Pain No/Denies Pain   Therapy/Group: Individual Therapy  Cherry Wittwer 09/22/2016, 3:04 PM

## 2016-09-22 NOTE — Progress Notes (Signed)
Subjective/Complaints: Pt seen sitting up in bed this AM, eating breakfast.  Per report, nursing, and family, pt slept well overnight.  Daughter and son with several questions regarding, TBI, seizures, and cognition.    ROS: Denies N/V/D, no facial pain or HA, no CP or SOB  Objective: Vital Signs: Blood pressure (!) 149/50, pulse (!) 59, temperature 97.9 F (36.6 C), temperature source Oral, resp. rate 18, weight 78.5 kg (173 lb 1 oz), SpO2 97 %. No results found. Results for orders placed or performed during the hospital encounter of 09/13/16 (from the past 72 hour(s))  Urine culture     Status: None (Preliminary result)   Collection Time: 09/19/16  6:44 PM  Result Value Ref Range   Specimen Description URINE, CLEAN CATCH    Special Requests NONE    Culture CULTURE REINCUBATED FOR BETTER GROWTH    Report Status PENDING   Urinalysis, Routine w reflex microscopic (not at [facility])     Status: Abnormal   Collection Time: 09/19/16  6:44 PM  Result Value Ref Range   Color, Urine YELLOW YELLOW   APPearance CLOUDY (A) CLEAR   Specific Gravity, Urine 1.010 1.005 - 1.030   pH 6.0 5.0 - 8.0   Glucose, UA NEGATIVE NEGATIVE mg/dL   Hgb urine dipstick SMALL (A) NEGATIVE   Bilirubin Urine NEGATIVE NEGATIVE   Ketones, ur NEGATIVE NEGATIVE mg/dL   Protein, ur NEGATIVE NEGATIVE mg/dL   Nitrite POSITIVE (A) NEGATIVE   Leukocytes, UA LARGE (A) NEGATIVE  Urine microscopic-add on     Status: Abnormal   Collection Time: 09/19/16  6:44 PM  Result Value Ref Range   Squamous Epithelial / LPF 0-5 (A) NONE SEEN   WBC, UA TOO NUMEROUS TO COUNT 0 - 5 WBC/hpf   RBC / HPF 0-5 0 - 5 RBC/hpf   Bacteria, UA MANY (A) NONE SEEN  Basic metabolic panel     Status: Abnormal   Collection Time: 09/20/16  3:48 AM  Result Value Ref Range   Sodium 137 135 - 145 mmol/L   Potassium 4.6 3.5 - 5.1 mmol/L   Chloride 102 101 - 111 mmol/L   CO2 25 22 - 32 mmol/L   Glucose, Bld 119 (H) 65 - 99 mg/dL   BUN 25 (H) 6 - 20  mg/dL   Creatinine, Ser 1.15 (H) 0.44 - 1.00 mg/dL   Calcium 9.4 8.9 - 10.3 mg/dL   GFR calc non Af Amer 43 (L) >60 mL/min   GFR calc Af Amer 50 (L) >60 mL/min    Comment: (NOTE) The eGFR has been calculated using the CKD EPI equation. This calculation has not been validated in all clinical situations. eGFR's persistently <60 mL/min signify possible Chronic Kidney Disease.    Anion gap 10 5 - 15  CBC with Differential/Platelet     Status: None   Collection Time: 09/20/16  3:48 AM  Result Value Ref Range   WBC 10.4 4.0 - 10.5 K/uL   RBC 4.20 3.87 - 5.11 MIL/uL   Hemoglobin 12.7 12.0 - 15.0 g/dL   HCT 37.8 36.0 - 46.0 %   MCV 90.0 78.0 - 100.0 fL   MCH 30.2 26.0 - 34.0 pg   MCHC 33.6 30.0 - 36.0 g/dL   RDW 14.0 11.5 - 15.5 %   Platelets 321 150 - 400 K/uL   Neutrophils Relative % 63 %   Neutro Abs 6.7 1.7 - 7.7 K/uL   Lymphocytes Relative 24 %   Lymphs Abs 2.5  0.7 - 4.0 K/uL   Monocytes Relative 9 %   Monocytes Absolute 0.9 0.1 - 1.0 K/uL   Eosinophils Relative 3 %   Eosinophils Absolute 0.3 0.0 - 0.7 K/uL   Basophils Relative 1 %   Basophils Absolute 0.1 0.0 - 0.1 K/uL     HEENT: Periorbital bruising and edema (improving).  Cardio: RRR, No JVD. Resp: CTA B/L and unlabored GI: BS positive and NT, ND Skin:   Bruise maxillary, improving. Warm and dry.  Neuro: Alert and Oriented x3, continues to have some difficulty with specific date. Somewhat slowed, but answers questions.  Motor: 4+/5 throughout  Musc/Skel:  No edema, no tenderness in extremities. Gen NAD. Vital signs reviewed.    Assessment/Plan: 1. Functional deficits secondary to TBI/SDH/SAH which require 3+ hours per day of interdisciplinary therapy in a comprehensive inpatient rehab setting. Physiatrist is providing close team supervision and 24 hour management of active medical problems listed below. Physiatrist and rehab team continue to assess barriers to discharge/monitor patient progress toward functional  and medical goals. FIM: Function - Bathing Position: Shower Body parts bathed by patient: Right arm, Left arm, Chest, Abdomen, Front perineal area, Buttocks, Right upper leg, Left upper leg Body parts bathed by helper: Back Assist Level: Touching or steadying assistance(Pt > 75%)  Function- Upper Body Dressing/Undressing What is the patient wearing?: Bra, Pull over shirt/dress Bra - Perfomed by patient: Thread/unthread right bra strap, Thread/unthread left bra strap Bra - Perfomed by helper: Hook/unhook bra (pull down sports bra) Pull over shirt/dress - Perfomed by patient: Thread/unthread right sleeve, Thread/unthread left sleeve, Put head through opening, Pull shirt over trunk Assist Level: Supervision or verbal cues, Set up Set up : To obtain clothing/put away Function - Lower Body Dressing/Undressing What is the patient wearing?: Pants, Socks, Shoes, Underwear Position: Sitting EOB Underwear - Performed by patient: Thread/unthread left underwear leg, Pull underwear up/down Underwear - Performed by helper: Thread/unthread right underwear leg Pants- Performed by patient: Pull pants up/down, Thread/unthread left pants leg, Thread/unthread right pants leg Pants- Performed by helper: Thread/unthread right pants leg Socks - Performed by patient: Don/doff right sock, Don/doff left sock Socks - Performed by helper: Don/doff left sock, Don/doff right sock Shoes - Performed by patient: Don/doff right shoe, Don/doff left shoe Shoes - Performed by helper: Don/doff left shoe, Don/doff right shoe Assist for footwear: Supervision/touching assist Assist for lower body dressing: Supervision or verbal cues  Function - Toileting Toileting steps completed by patient: Performs perineal hygiene, Adjust clothing after toileting, Adjust clothing prior to toileting Toileting steps completed by helper: Adjust clothing prior to toileting, Adjust clothing after toileting Toileting Assistive Devices: Grab bar  or rail Assist level: Supervision or verbal cues  Function - Air cabin crew transfer assistive device: Walker, Elevated toilet seat/BSC over toilet Assist level to toilet: Supervision or verbal cues Assist level from toilet: Supervision or verbal cues  Function - Chair/bed transfer Chair/bed transfer method: Ambulatory Chair/bed transfer assist level: Supervision or verbal cues Chair/bed transfer assistive device: Armrests, Walker Chair/bed transfer details: Verbal cues for technique, Verbal cues for precautions/safety, Verbal cues for safe use of DME/AE  Function - Locomotion: Wheelchair Will patient use wheelchair at discharge?: No Function - Locomotion: Ambulation Assistive device: Walker-rolling Max distance: 100 Assist level: Supervision or verbal cues Assist level: Supervision or verbal cues Assist level: Supervision or verbal cues Assist level: Supervision or verbal cues Assist level: Supervision or verbal cues  Function - Comprehension Comprehension: Auditory Comprehension assist level: Understands basic 50 -  74% of the time/ requires cueing 25 - 49% of the time  Function - Expression Expression: Verbal Expression assist level: Expresses basis less than 25% of the time/requires cueing >75% of the time.  Function - Social Interaction Social Interaction assist level: Interacts appropriately 25 - 49% of time - Needs frequent redirection.  Function - Problem Solving Problem solving assist level: Solves basic 25 - 49% of the time - needs direction more than half the time to initiate, plan or complete simple activities  Function - Memory Memory assist level: Recognizes or recalls 50 - 74% of the time/requires cueing 25 - 49% of the time Patient normally able to recall (first 3 days only): Current season, That he or she is in a hospital  Medical Problem List and Plan: 1.  Traumatic TBI /SDH/SAH as well as maxillofacial fractures secondary to syncopal  event.  Cont CIR   Poor sleep +/- UTI likely contributing to progressive cognitive fatigue during day. Pt slept better last 2 nights and had a better day yesterday.  Spent significant time with family today addressing questions.    Keppra decreased per family insistence for cognition.  Will speak to Neurosurg regarding duration of treatment.  Head CT (per family insistence) reviewed from 10/24, expected evolution 2.  DVT Prophylaxis/Anticoagulation: SCDs. Monitor for any signs of DVT 3. Pain Management: Flexeril as needed for muscle spasms as prior to admission. 4. Seizure prophylaxis. Keppra 500 mg twice a day, decreased to 250 BID on 10/23 per family insistance. EEG negative 5. Neuropsych: This patient is capable of making decisions on her own behalf. 6. Skin/Wound Care: Routine skin checks 7. Fluids/Electrolytes/Nutrition: Routine I&Os   Dietitian consult 8. CAD/NSTEMI status post stenting.   Cont aspirin 10/24   Plavix in 1 weeks, monitor for increasing bleed.   No chest pain or shortness of breath 9. Hypertension.   Lisinopril 10 mg daily  Lopressor 12.5 mg twice a day, increased to 25.   Stable, will cont to monitor Vitals:   09/21/16 1347 09/22/16 0543  BP: (!) 114/49 (!) 149/50  Pulse: 67 (!) 59  Resp: 18 18  Temp: 97.8 F (36.6 C) 97.9 F (36.6 C)  10. Hypothyroidism. Synthroid. 11. Hyperlipidemia.Zetia/Lipitor 12. Sleep disturbance  Resulting in fatigue and increasing cognitive difficulties as day progresses  Trazodone scheduled, increased to 100 10/24, improved with increased dose 13. UTI  UA with bac, Ucx remains pending  Empiric macrobid started 10/25 14. AKI  Cr. 1.15 on 10/25  Encourage fluid intake  >35 minutes spent with pt and family with 46 of time spent educating, counseling, and answering questions regarding seizures, brain injury, cognition, bleeding, and recovery.  LOS (Days) 9 A FACE TO FACE EVALUATION WAS PERFORMED  Sydnie Sigmund Lorie Phenix 09/22/2016, 7:52 AM

## 2016-09-22 NOTE — Progress Notes (Signed)
Social Work Patient ID: Kristin Shaffer, female   DOB: [birth year], 82 y.o.   MRN: [id number]  Met with son who reports Mom is much better today. He did get to talk with the MD and have his questions answered. Hopefully she will improve each day forward and get back to the level she was before her decline.

## 2016-09-22 NOTE — Progress Notes (Signed)
Patient slept well for the night but was impulsive & fails to call for assistance, bed alarm on. Refuses to answer questions & so was difficult to assess her orientation.

## 2016-09-22 NOTE — Plan of Care (Signed)
Problem: RH SAFETY Goal: RH STG ADHERE TO SAFETY PRECAUTIONS W/ASSISTANCE/DEVICE STG Adhere to Safety Precautions With Assistance/Device. Supervision  Outcome: Not Progressing Impulsive, fails to call for assist

## 2016-09-22 NOTE — Progress Notes (Signed)
Occupational Therapy Session Note  Patient Details  Name: Kristin Shaffer MRN: [id number] Date of Birth: 06/11/1931  Today's Date: 09/22/2016 OT Individual Time: 0900-1000 OT Individual Time Calculation (min): 60 min     Short Term Goals: Week 1:  OT Short Term Goal 1 (Week 1): LTG=STG 2/2 estimated short LOS  Skilled Therapeutic Interventions/Progress Updates:   1:1 Self care retraining at shower level. Pt able to navigate around room with Rw with close supervision. Pt able to sequence bathing With decr amt of cuing and more automatic. Pt also able to dress from clothes laid out on table on her right with supervision with in normal amt of time without cues for sequence or clothing orientation. Pt also able to brush teeth with no cuing today as well!! Pt ambulated to gym with increased speed (decr fall risk). Pt engaged in UB/ UE coordination and strengthening playing with zoom-ball in sitting and standing. Pt able to maintain quick pace and bilateral coordination. Pt returned to room and into recliner with safety belt.  Pt with improved cognition and verbal communication today compared to the last couple days.   2nd session 14:15-15:00 (102min) 1:1 Cognitive retraining with focus on verbal functional communication, socialization, navigation/wayfinding with a map task, and functional problem solving. Pt able to complete map task with min A with extra time for problem solving. Left in recliner to rest.   Therapy Documentation Precautions:  Precautions Precautions: Fall Precaution Comments: hx of syncope Restrictions Weight Bearing Restrictions: No Pain:  no c/o pain in either session  See Function Navigator for Current Functional Status.   Therapy/Group: Individual Therapy  Willeen Cass [facility] 09/22/2016, 3:04 PM

## 2016-09-22 NOTE — Progress Notes (Signed)
Physical Therapy Session Note  Patient Details  Name: Kristin Shaffer MRN: [id number] Date of Birth: May 11, 1931  Today's Date: 09/22/2016 PT Individual Time: 1107-1200 and 1300-1330 PT Individual Time Calculation (min): 53 min and 30 min   Short Term Goals: Week 2:  PT Short Term Goal 1 (Week 2): = LTGs due to anticipated LOS  Skilled Therapeutic Interventions/Progress Updates:    Treatment 1: Patient in recliner tweezing eyebrows with daughter present. Patient and daughter as well as OT and SLP reporting improvement in participation and communication this date. Discussed discharge planning, DME needs, and f/u HHPT. Patient independently requested to toilet and ambulated to bathroom for toileting and sink for hand hygiene using RW with distant supervision and no cues. Gait training using RW 2 x 150 ft with supervision and improved gait speed. Performed NuStep using BUE/BLE at level 5 x 10 min for strengthening and endurance with improved pace. Seated EOM, patient instructed in completing same 24 piece jigsaw puzzle as yesterday, demonstrating improved attention to task and ability to follow 1-step commands but still requiring max-total multimodal cues to complete puzzle. Patient ambulated back to room and left sitting in recliner with daughter present. Patient initially more conversational but by end of session patient speaking at word level only.     Treatment 2: Patient in recliner upon arrival, able to answer questions at word level. Patient ambulated throughout rehab unit from room > BI gym > therapy gym > speech office using RW with supervision and performed sit <> stand transfers using RW with supervision and min verbal cues for safe hand placement. Engaged in Josephine to challenge dynamic standing balance and attention with mode A for training progressed to mode A reading words off T-scope with improvement from score of 18 and stating 3 words to score of 20 and stating 8 words. Stair  training up/down 12 (6") stairs using 2 rails with supervision. Patient performed path finding to speech office with supervision, left with SLP for next session.   Therapy Documentation Precautions:  Precautions Precautions: Fall Precaution Comments: hx of syncope Restrictions Weight Bearing Restrictions: No Pain: Pain Assessment Pain Assessment: No/denies pain Pain Score: 0-No pain   See Function Navigator for Current Functional Status.   Therapy/Group: Individual Therapy  [name] 09/22/2016, 12:09 PM

## 2016-09-22 NOTE — Progress Notes (Addendum)
Social Work Patient ID: Kristin Shaffer, female   DOB: [birth year], 82 y.o.   MRN: [id number]  Team feels pt is back to supervision level and would be ready as long as medically ready for discharge 10/30 after therapies. MD feels as long as she has a good weekend and remains medically stable she would be ready on Monday after therapies. Have informed daughter who is here and she is agreeable to this. Discussed  Equipment and follow up needs, will go ahead and order equipment for home and home health. Pt would rather be home and feels she would do better at home this is really tiring her out. Will see how the weekend goes.

## 2016-09-23 ENCOUNTER — Inpatient Hospital Stay (HOSPITAL_COMMUNITY): Payer: Medicare Other

## 2016-09-23 ENCOUNTER — Inpatient Hospital Stay (HOSPITAL_COMMUNITY): Payer: Medicare Other | Admitting: Physical Therapy

## 2016-09-23 LAB — URINE CULTURE

## 2016-09-23 NOTE — Progress Notes (Signed)
Physical Therapy Session Note  Patient Details  Name: KASONDRA SANDLER MRN: [id number] Date of Birth: Apr 26, 1931  Today's Date: 09/23/2016 PT Individual Time: 1500-1544 PT Individual Time Calculation (min): 44 min    Short Term Goals: Week 1:  PT Short Term Goal 1 (Week 1): = LTGs due to anticpated LOS  Skilled Therapeutic Interventions/Progress Updates:     Patient received sleeping in recliner; aroused easily. PT instructed patient in gait training for 179ft with RW and supervision Assist. Min cues from PT for direction in familiar environmemt.  Matching game while standing on airex. With mod cues for proper procedure and location of   Searching for 4 cones through rehab gym with RW. Patient able to remember color of 4 cones 75% of the time and able to recall with min cues from PT.   Gait in gift shop for 259ft with supervision Assist from PT. Additional gait after prolonged rest break for 75 ft.    WC mobility to return to room with BUE/BLE propulsion while attempting to locate room. PT provided mod cues for prolem solving strategies for use of signs and elevator use.   Patient left sitting in recliner with call bell in reach.    Therapy Documentation Precautions:  Precautions Precautions: Fall Precaution Comments: hx of syncope Restrictions Weight Bearing Restrictions: No General:   Vital Signs: Therapy Vitals Temp: 97.4 F (36.3 C) Temp Source: Oral Pulse Rate: 71 Resp: 18 BP: (!) 121/46 Patient Position (if appropriate): Sitting Oxygen Therapy SpO2: 93 % O2 Device: Not Delivered   See Function Navigator for Current Functional Status.   Therapy/Group: Individual Therapy  Lorie Phenix 09/23/2016, 3:50 PM

## 2016-09-23 NOTE — Progress Notes (Signed)
Occupational Therapy Session Note  Patient Details  Name: Kristin Shaffer MRN: [id number] Date of Birth: 27-Mar-1931  Today's Date: 09/23/2016 OT Individual Time: 1300-1400 OT Individual Time Calculation (min): 60 min   Short Term Goals: Week 1:  OT Short Term Goal 1 (Week 1): LTG=STG 2/2 estimated short LOS  Skilled Therapeutic Interventions/Progress Updates:   ADL-retraining with focus on improved safety awareness, attention, problem-solving and family education (son and daughter present).   Pt received seated in recliner, poorly groomed but receptive for bathing/dressing task at tub/shower as presented as a new challenge.  Daughter stated she would assist with pt's hair care after session.   Pt minimally expressive with therapist after setup to provide RW.    Pt able to follow simple commands but remained minimally expressive and would not request assist when challenged by task or problem during entire session.   Pt performed mobility and transfers with close supervision, ignored cues to sit for safety while undressing, and proceeded through shower with poor awareness, attention and thoroughness at a rapid pace, again ignoring cues to slow down and requiring need for OT intervention to intercept and control use of hand shower to minimize overspray.    Pt recovered to shower chair in bathroom to dress with close supervision to problem-solve with lacing feet through her pants, pulling up underwear and pants completely, and lacing hands through her shirt sleeves.   Pt ambulated back to room with min assist to manage RW.  Pt recovered in recliner out-of-breath at end of session and rested as OT advised family on need for close supervision, 24/7 monitoring, use of alarms/monitors commercially available, and recommendations relating to performance of home making tasks.   Therapy session ended early d/t pt fatigue with need for rest to recover.     Therapy Documentationre Precautions:   Precautions Precautions: Fall Precaution Comments: hx of syncope Restrictionser Weight Bearing Restrictions: No   Vital Signs: Therapy Vitals Temp: 97.4 F (36.3 C) Temp Source: Oral Pulse Rate: 71 Resp: 18 BP: (!) 121/46 Patient Position (if appropriate): Sitting Oxygen Therapy SpO2: 93 % O2 Device: Not Delivered   Pain: No/denies pain     See Function Navigator for Current Functional Status.   Therapy/Group: Individual Therapy  Wynona Duhamel 09/23/2016, 3:35 PM

## 2016-09-23 NOTE — Progress Notes (Signed)
Subjective/Complaints: Pt slept fairly well. Eating breakfast now. C/o back pain---tylenol did help it last night   ROS: Denies N/V/D, no facial pain or HA, no CP or SOB  Objective: Vital Signs: Blood pressure 133/66, pulse 71, temperature 98 F (36.7 C), temperature source Oral, resp. rate 18, weight 78.5 kg (173 lb 1 oz), SpO2 99 %. No results found. No results found for this or any previous visit (from the past 72 hour(s)).   HEENT:Right Periorbital bruising and edema (improving).  Cardio: RRR, No JVD. Resp: CTA B/L and unlabored GI: BS positive and NT, ND Skin:   Bruise maxillary, improving. Warm and dry otherwise  Neuro: Alert and Oriented x3, continues to have some difficulty with specific date. Somewhat slowed, but answers questions.  Motor: 4+/5 throughout  Musc/Skel:  No edema, no tenderness in extremities. Gen NAD.  Marland Kitchen    Assessment/Plan: 1. Functional deficits secondary to TBI/SDH/SAH which require 3+ hours per day of interdisciplinary therapy in a comprehensive inpatient rehab setting. Physiatrist is providing close team supervision and 24 hour management of active medical problems listed below. Physiatrist and rehab team continue to assess barriers to discharge/monitor patient progress toward functional and medical goals. FIM: Function - Bathing Position: Shower Body parts bathed by patient: Right arm, Left arm, Chest, Abdomen, Front perineal area, Buttocks, Right upper leg, Left upper leg, Right lower leg, Left lower leg Body parts bathed by helper: Back Assist Level: Supervision or verbal cues  Function- Upper Body Dressing/Undressing What is the patient wearing?: Bra, Pull over shirt/dress Bra - Perfomed by patient: Thread/unthread right bra strap, Thread/unthread left bra strap, Hook/unhook bra (pull down sports bra) Bra - Perfomed by helper: Hook/unhook bra (pull down sports bra) Pull over shirt/dress - Perfomed by patient: Thread/unthread right sleeve,  Thread/unthread left sleeve, Put head through opening, Pull shirt over trunk Assist Level: Supervision or verbal cues, Set up Set up : To obtain clothing/put away Function - Lower Body Dressing/Undressing What is the patient wearing?: Pants, Socks, Shoes, Underwear Position: Sitting EOB Underwear - Performed by patient: Thread/unthread left underwear leg, Pull underwear up/down, Thread/unthread right underwear leg Underwear - Performed by helper: Thread/unthread right underwear leg Pants- Performed by patient: Pull pants up/down, Thread/unthread left pants leg, Thread/unthread right pants leg Pants- Performed by helper: Thread/unthread right pants leg Socks - Performed by patient: Don/doff right sock, Don/doff left sock Socks - Performed by helper: Don/doff left sock, Don/doff right sock Shoes - Performed by patient: Don/doff right shoe, Don/doff left shoe Shoes - Performed by helper: Don/doff left shoe, Don/doff right shoe Assist for footwear: Supervision/touching assist Assist for lower body dressing: Supervision or verbal cues  Function - Toileting Toileting steps completed by patient: Performs perineal hygiene, Adjust clothing after toileting, Adjust clothing prior to toileting Toileting steps completed by helper: Adjust clothing after toileting Toileting Assistive Devices: Grab bar or rail Assist level: Supervision or verbal cues  Function Midwife transfer assistive device: Pension scheme manager level to toilet: Supervision or verbal cues Assist level from toilet: Supervision or verbal cues  Function - Chair/bed transfer Chair/bed transfer method: Ambulatory Chair/bed transfer assist level: Supervision or verbal cues Chair/bed transfer assistive device: Armrests, Walker Chair/bed transfer details: Verbal cues for technique, Verbal cues for precautions/safety, Verbal cues for safe use of DME/AE  Function - Locomotion: Wheelchair Will patient use wheelchair at  discharge?: No Function - Locomotion: Ambulation Assistive device: Walker-rolling Max distance: 300 ft Assist level: Supervision or verbal cues Assist level: Supervision or verbal  cues Assist level: Supervision or verbal cues Assist level: Supervision or verbal cues Assist level: Supervision or verbal cues  Function - Comprehension Comprehension: Auditory Comprehension assist level: Understands basic 75 - 89% of the time/ requires cueing 10 - 24% of the time  Function - Expression Expression: Verbal Expression assist level: Expresses basic 50 - 74% of the time/requires cueing 25 - 49% of the time. Needs to repeat parts of sentences.  Function - Social Interaction Social Interaction assist level: Interacts appropriately 50 - 74% of the time - May be physically or verbally inappropriate.  Function - Problem Solving Problem solving assist level: Solves basic 25 - 49% of the time - needs direction more than half the time to initiate, plan or complete simple activities  Function - Memory Memory assist level: Recognizes or recalls 25 - 49% of the time/requires cueing 50 - 75% of the time Patient normally able to recall (first 3 days only): Current season, That he or she is in a hospital  Medical Problem List and Plan: 1.  Traumatic TBI /SDH/SAH as well as maxillofacial fractures secondary to syncopal event.  Cont CIR   -sleeping better/improvements in cognition.    Keppra decreased per family insistence for cognition.   Head CT (per family insistence) reviewed from 10/24, expected evolution 2.  DVT Prophylaxis/Anticoagulation: SCDs. Monitor for any signs of DVT 3. Pain Management: Flexeril as needed for muscle spasms as prior to admission.  -tylenol prn also.   -offered Kpad 4. Seizure prophylaxis. Keppra 500 mg twice a day, decreased to 250 BID on 10/23 per family insistance. EEG negative 5. Neuropsych: This patient is capable of making decisions on her own behalf. 6. Skin/Wound  Care: Routine skin checks 7. Fluids/Electrolytes/Nutrition: Routine I&Os   Dietitian consult 8. CAD/NSTEMI status post stenting.   Cont aspirin 10/24   Plavix in 1 weeks, monitor for increasing bleed.   No chest pain or shortness of breath 9. Hypertension.   Lisinopril 10 mg daily  Lopressor 12.5 mg twice a day, increased to 25.   Stable on current regimen Vitals:   09/22/16 2057 09/23/16 0558  BP: (!) 143/50 133/66  Pulse: 77 71  Resp:  18  Temp:  98 F (36.7 C)  10. Hypothyroidism. Synthroid. 11. Hyperlipidemia.Zetia/Lipitor 12. Sleep disturbance  Resulting in fatigue and increasing cognitive difficulties as day progresses  Trazodone scheduled, increased to 100 10/24, improving with increased dose 13. UTI  UA with bac, Ucx with 100k E coli  Empiric macrobid started 10/25 (sens pending) 14. AKI  Cr. 1.15 on 10/25  push fluid intake      LOS (Days) 10 A FACE TO FACE EVALUATION WAS PERFORMED  Delvin Hedeen T 09/23/2016, 8:20 AM

## 2016-09-23 NOTE — Progress Notes (Signed)
Physical Therapy Session Note  Patient Details  Name: Kristin Shaffer MRN: [id number] Date of Birth: 01-13-1931  Today's Date: 09/23/2016 PT Individual Time: 1103-1200 PT Individual Time Calculation (min): 57 min    Short Term Goals: Week 2:  PT Short Term Goal 1 (Week 2): = LTGs due to anticipated LOS  Skilled Therapeutic Interventions/Progress Updates:   Pt presented in chair with family present. Per family request pt in same clothes as yesterday. Changed clothes with modA 2/2 time management, however pt demonstrating F+ balance while standing to pull up underwear and pants . Pt ambulated 174ft with cues for keeping close to RW and erect posture. Engaged in connect four while in standing with cues for completion of task and sequencing. Played x 3 bouts with improved carryover by 3rd game.  NuStep L5 x 10 minutes for increased endurance with pt demonstrating mild exertion upon completion.  Ambulated gym to day room to pt's room with seated rests with pt leading directions with pt able to appropriately guide PTA to appropriate locations. Pt initiated treatment with single word answers however as treatment progressed pt able to respond with 3-4 word phrases that were appropriate in manner.  Pt left in chair with QRB in place and family/friends present.   Therapy Documentation Precautions:  Precautions Precautions: Fall Precaution Comments: hx of syncope Restrictions Weight Bearing Restrictions: No General:   Vital Signs: Therapy Vitals Pulse Rate: 72 BP: (!) 133/45 Pain:   Mobility:   Locomotion :    Trunk/Postural Assessment :    Balance:   Exercises:   Other Treatments:     See Function Navigator for Current Functional Status.   Therapy/Group: Individual Therapy  Treva Huyett 09/23/2016, 12:21 PM

## 2016-09-24 ENCOUNTER — Inpatient Hospital Stay (HOSPITAL_COMMUNITY): Payer: Medicare Other | Admitting: Physical Therapy

## 2016-09-24 ENCOUNTER — Inpatient Hospital Stay (HOSPITAL_COMMUNITY): Payer: Medicare Other

## 2016-09-24 NOTE — Progress Notes (Signed)
Occupational Therapy Session Note  Patient Details  Name: Kristin Shaffer MRN: [id number] Date of Birth: 1931/08/21  Today's Date: 09/24/2016 OT Individual Time: [id number] OT Individual Time Calculation (min): 45 min     Short Term Goals: Week 1:  OT Short Term Goal 1 (Week 1): LTG=STG 2/2 estimated short LOS  Skilled Therapeutic Interventions/Progress Updates:   ADL-retraining at shower level with focus on improved awareness, attention, and problem-solving during assisted BADL.   Pt received seated in recliner with QRB applied and with son and grandson visiting her in her room.   With moderate vc, pt was able to recall previous session with writer and agreed to complete BADL again using walk-in shower in her room.   After setup to place RW, pt ambulated to bathroom with SBA but again required close supervision modulate pace of activity and to manage supplies and materials during entire session.   Pt is unable to problem-solve use of hand shower during task and is unaware of overspray with no observed carry-over of skills.   With OT managing hand shower and providing cues and assist to apply soap to wash cloth, pt proceeds through bathing task at a rapid pace, often repeating cleaning her legs and feet.   Pt dressed seated on bedside commode over toilet as therapist handed each item to her.   Pt then ambulated back to her recliner and waited as OT provided setup for grooming at sink,   Pt groomed standing and returned to recliner at end of session as family returned back to her room.   OT advised pt's son of recommendation for pt's daughter to assist with bathing on pt's last day in therapy with therapist providing SBA for safety.  Therapy Documentation Precautions:  Precautions Precautions: Fall Precaution Comments: hx of syncope Restrictions Weight Bearing Restrictions: No  Vital Signs: Therapy Vitals Pulse Rate: 73 Resp: 18 BP: (!) 130/59 Patient Position (if appropriate):  Sitting Oxygen Therapy SpO2: 98 %   Pain: No/denies pain     ADL: ADL ADL Comments: See functional navigator  See Function Navigator for Current Functional Status.   Therapy/Group: Individual Therapy  Virat Prather 09/24/2016, 11:06 AM

## 2016-09-24 NOTE — Plan of Care (Signed)
Problem: RH SAFETY Goal: RH STG ADHERE TO SAFETY PRECAUTIONS W/ASSISTANCE/DEVICE STG Adhere to Safety Precautions With Assistance/Device. Supervision  Outcome: Not Progressing Continues to get OOB without assistance

## 2016-09-24 NOTE — Progress Notes (Signed)
Subjective/Complaints: Up in bed. Had a good night. Denies pain. No problems per RN  ROS: Pt denies fever, rash/itching, headache, blurred or double vision, nausea, vomiting, abdominal pain, diarrhea, chest pain, shortness of breath, palpitations, dysuria, dizziness, neck or back pain, bleeding, anxiety, or depression   Objective: Vital Signs: Blood pressure (!) 141/49, pulse 73, temperature 98.1 F (36.7 C), temperature source Oral, resp. rate 18, weight 78.5 kg (173 lb 1 oz), SpO2 93 %. No results found. No results found for this or any previous visit (from the past 72 hour(s)).   HEENT:Right Periorbital bruising and edema (improving).  Cardio: RRR, No JVD. Resp: CTA B/L and unlabored, no tenderness GI: BS positive and NT, ND Skin:   Bruise maxillary, improving. Warm and dry otherwise  Neuro: Alert and Oriented x3, knew she was going home this week Somewhat slowed, but answers questions.  Motor: 4+/5 throughout  Musc/Skel:  No edema, no tenderness in extremities. Gen NAD.  Marland Kitchen    Assessment/Plan: 1. Functional deficits secondary to TBI/SDH/SAH which require 3+ hours per day of interdisciplinary therapy in a comprehensive inpatient rehab setting. Physiatrist is providing close team supervision and 24 hour management of active medical problems listed below. Physiatrist and rehab team continue to assess barriers to discharge/monitor patient progress toward functional and medical goals. FIM: Function - Bathing Position: Shower Body parts bathed by patient: Right arm, Left arm, Chest, Abdomen, Front perineal area, Buttocks, Right upper leg, Left upper leg, Right lower leg, Left lower leg Body parts bathed by helper: Back Assist Level: Supervision or verbal cues  Function- Upper Body Dressing/Undressing What is the patient wearing?: Bra, Pull over shirt/dress Bra - Perfomed by patient: Thread/unthread right bra strap, Thread/unthread left bra strap, Hook/unhook bra (pull down  sports bra) Bra - Perfomed by helper: Hook/unhook bra (pull down sports bra) Pull over shirt/dress - Perfomed by patient: Thread/unthread right sleeve, Thread/unthread left sleeve, Put head through opening, Pull shirt over trunk Assist Level: Supervision or verbal cues, Set up Set up : To obtain clothing/put away Function - Lower Body Dressing/Undressing What is the patient wearing?: Pants, Socks Position: Sitting EOB Underwear - Performed by patient: Thread/unthread left underwear leg, Pull underwear up/down, Thread/unthread right underwear leg Underwear - Performed by helper: Thread/unthread right underwear leg Pants- Performed by patient: Pull pants up/down, Thread/unthread left pants leg, Thread/unthread right pants leg Pants- Performed by helper: Thread/unthread right pants leg Socks - Performed by patient:  (did not want to take socks off) Socks - Performed by helper: Don/doff left sock, Don/doff right sock Shoes - Performed by patient: Don/doff right shoe, Don/doff left shoe Shoes - Performed by helper: Don/doff left shoe, Don/doff right shoe Assist for footwear: Supervision/touching assist Assist for lower body dressing: Supervision or verbal cues  Function - Toileting Toileting steps completed by patient: Adjust clothing prior to toileting, Performs perineal hygiene, Adjust clothing after toileting Toileting steps completed by helper: Adjust clothing after toileting Toileting Assistive Devices: Grab bar or rail Assist level: Touching or steadying assistance (Pt.75%)  Function - Air cabin crew transfer assistive device: Walker, Grab bar Assist level to toilet: Supervision or verbal cues Assist level from toilet: Supervision or verbal cues  Function - Chair/bed transfer Chair/bed transfer method: Ambulatory Chair/bed transfer assist level: Supervision or verbal cues Chair/bed transfer assistive device: Armrests, Walker Chair/bed transfer details: Verbal cues for  technique, Verbal cues for precautions/safety, Verbal cues for safe use of DME/AE  Function - Locomotion: Wheelchair Will patient use wheelchair at discharge?: No  Function - Locomotion: Ambulation Assistive device: Walker-rolling Max distance: 300 ft Assist level: Supervision or verbal cues Assist level: Supervision or verbal cues Assist level: Supervision or verbal cues Assist level: Supervision or verbal cues Assist level: Supervision or verbal cues  Function - Comprehension Comprehension: Auditory Comprehension assist level: Understands basic 50 - 74% of the time/ requires cueing 25 - 49% of the time  Function - Expression Expression: Verbal Expression assist level: Expresses basic 50 - 74% of the time/requires cueing 25 - 49% of the time. Needs to repeat parts of sentences.  Function - Social Interaction Social Interaction assist level: Interacts appropriately with others - No medications needed.  Function - Problem Solving Problem solving assist level: Solves basic 50 - 74% of the time/requires cueing 25 - 49% of the time  Function - Memory Memory assist level: Recognizes or recalls 25 - 49% of the time/requires cueing 50 - 75% of the time Patient normally able to recall (first 3 days only): Current season, That he or she is in a hospital  Medical Problem List and Plan: 1.  Traumatic TBI /SDH/SAH as well as maxillofacial fractures secondary to syncopal event.  Cont CIR   -sleeping better/improvements in cognition overall--likely related to better sleep, rx of UTI, and decrease of keppra 2.  DVT Prophylaxis/Anticoagulation: SCDs. Monitor for any signs of DVT 3. Pain Management: Flexeril as needed for muscle spasms as prior to admission.  -tylenol prn also.   -offered Kpad 4. Seizure prophylaxis. Keppra 500 mg twice a day, decreased to 250 BID on 10/23 per family insistance. EEG negative 5. Neuropsych: This patient is capable of making decisions on her own behalf. 6.  Skin/Wound Care: Routine skin checks 7. Fluids/Electrolytes/Nutrition: Routine I&Os   Dietitian consult 8. CAD/NSTEMI status post stenting.   Cont aspirin 10/24   Plavix in 1 week, monitor for increasing bleed.   No chest pain or shortness of breath 9. Hypertension.   Lisinopril 10 mg daily  Lopressor 12.5 mg twice a day, increased to 25.   Stable on current regimen Vitals:   09/23/16 2047 09/24/16 0527  BP: (!) 136/54 (!) 141/49  Pulse: 64 73  Resp:  18  Temp:  98.1 F (36.7 C)  10. Hypothyroidism. Synthroid. 11. Hyperlipidemia.Zetia/Lipitor 12. Sleep disturbance  Resulting in fatigue and increasing cognitive difficulties as day progresses  Trazodone scheduled, increased to 100 10/24, improved with increased dose 13. UTI   Ucx with 100k E coli--sens to macrobid (started 10/25) 14. AKI  Cr. 1.15 on 10/25  push fluid intake      LOS (Days) 11 A FACE TO FACE EVALUATION WAS PERFORMED  Kristin Shaffer 09/24/2016, 8:16 AM

## 2016-09-24 NOTE — Discharge Summary (Signed)
Discharge summary job # [id number]

## 2016-09-24 NOTE — Progress Notes (Signed)
Physical Therapy Session Note  Patient Details  Name: Kristin Shaffer MRN: [id number] Date of Birth: 1931/09/10  Today's Date: 09/24/2016 PT Individual Time: 0850-0916 PT Individual Time Calculation (min): 26 min    Short Term Goals: Week 2:  PT Short Term Goal 1 (Week 2): = LTGs due to anticipated LOS  Skilled Therapeutic Interventions/Progress Updates:    Pt received in bed & agreeable to tx, noting 8/10 back pain & RN made aware. Pt transferred supine>sitting EOB with hospital bed features & supervision overall. Pt required significantly extra time to don non-skid slipper socks with supervision overall. Pt ambulated room>gym with RW & supervision without cuing for path finding. Utilized nu-step with all 4 extremities for endurance & strengthen training up to level 6 x 7 minutes. Pt completed car transfer with supervision & RW from low van seat height. At end of session pt left sitting in recliner with QRB in place, BLE elevated & all needs within reach.  Pt required cuing for safe hand placement with sit<>stand transfers with poor carryover throughout session. Pt required cuing to ambulate within base of RW as pt attempted to push it too far out in front of her.   Therapy Documentation Precautions:  Precautions Precautions: Fall Precaution Comments: hx of syncope Restrictions Weight Bearing Restrictions: No   See Function Navigator for Current Functional Status.   Therapy/Group: Individual Therapy  Waunita Schooner 09/24/2016, 9:42 AM

## 2016-09-25 ENCOUNTER — Inpatient Hospital Stay (HOSPITAL_COMMUNITY): Payer: Medicare Other

## 2016-09-25 ENCOUNTER — Inpatient Hospital Stay (HOSPITAL_COMMUNITY): Payer: Medicare Other | Admitting: Speech Pathology

## 2016-09-25 ENCOUNTER — Inpatient Hospital Stay (HOSPITAL_COMMUNITY): Payer: Medicare Other | Admitting: Occupational Therapy

## 2016-09-25 ENCOUNTER — Encounter (HOSPITAL_COMMUNITY): Payer: Self-pay | Admitting: Physical Medicine and Rehabilitation

## 2016-09-25 MED ORDER — DOCUSATE SODIUM 100 MG PO CAPS: 100.0000 mg | ORAL_CAPSULE | Freq: Two times a day (BID) | ORAL | 0 refills | Status: DC

## 2016-09-25 MED ORDER — METOPROLOL TARTRATE 25 MG PO TABS: 25.0000 mg | ORAL_TABLET | Freq: Two times a day (BID) | ORAL | 0 refills | Status: DC

## 2016-09-25 MED ORDER — TRAZODONE HCL 100 MG PO TABS: 100.0000 mg | ORAL_TABLET | Freq: Every day | ORAL | 0 refills | Status: DC

## 2016-09-25 MED ORDER — PANTOPRAZOLE SODIUM 40 MG PO TBEC: 40.0000 mg | DELAYED_RELEASE_TABLET | Freq: Every day | ORAL | 0 refills | Status: DC

## 2016-09-25 MED ORDER — NITROFURANTOIN MONOHYD MACRO 100 MG PO CAPS: 100.0000 mg | ORAL_CAPSULE | Freq: Two times a day (BID) | ORAL | 0 refills | Status: DC

## 2016-09-25 MED ORDER — BACITRACIN ZINC 500 UNIT/GM EX OINT: TOPICAL_OINTMENT | Freq: Two times a day (BID) | CUTANEOUS | 0 refills | Status: DC

## 2016-09-25 MED ORDER — LEVETIRACETAM 250 MG PO TABS
250.0000 mg | ORAL_TABLET | Freq: Two times a day (BID) | ORAL | 0 refills | Status: DC
Start: 2016-09-25 — End: 2016-12-29

## 2016-09-25 MED ORDER — CLOPIDOGREL BISULFATE 75 MG PO TABS: 75.0000 mg | ORAL_TABLET | Freq: Every day | ORAL | 0 refills | Status: DC

## 2016-09-25 NOTE — Progress Notes (Signed)
Physical Therapy Discharge Summary  Patient Details  Name: Kristin Shaffer MRN: [id number] Date of Birth: 1931/04/08  Today's Date: 09/25/2016 PT Individual Time: 0903-1018 PT Individual Time Calculation (min): 75 min  Session focused on completing grad day activities/preparing for discharge as well as addressing neuro re-ed for balance retraining. Pt completed all functional mobility tasks including gait with RW, stairs, simulated car transfer, gait over ramp and mulched surface to simulate outdoor mobility, stair negotiation, transfers, and bed mobility and transfers in ADL apartment at supervision level. Pt able to verbalize why she was here, what her deficits were, and why she is using a RW for her mobility. readministered standardized balance assessments to indicate fall risk (see below) and discussed results with patient. Engaged in neuro re-ed for balance re-training while on compliant surface without UE to reach outside BOS and perform cognitive task (steady assist for balance). General endurance and strengthening on Nustep x 10 min on level 6. Gait training without AD to challenge balance with overall min assist including through obstacle course to simulate home and community mobility. Pt's family present upon return to room and deny any concerns in regards to d/c today.   Patient has met 10 of 11 long term goals due to improved activity tolerance, improved balance, ability to compensate for deficits, improved attention and improved awareness.  Patient to discharge at an ambulatory level Supervision with RW.  Patient's care partner is independent to provide the necessary cognitive and supervision assistance at discharge.  Reasons goals not met: Furniture transfer goal was still set at modified independent but pt is overall supervision due to cognitive impairments.  Recommendation:  Patient will benefit from ongoing skilled PT services in home health setting to continue to advance safe  functional mobility, address ongoing impairments in cognition, balance, endurance, and minimize fall risk.  Equipment: RW  Reasons for discharge: treatment goals met and discharge from hospital  Patient/family agrees with progress made and goals achieved: Yes  PT Discharge Precautions/RestrictionsPrecautions Precautions: Fall Restrictions Weight Bearing Restrictions: No  Pain  Denies pain. Cognition Overall Cognitive Status: Impaired/Different from baseline [facility] Scales of Cognitive Functioning: Purposeful/appropriate  See ST dc summary for complete cognitive assessment. Sensation Sensation Light Touch: Appears Intact Proprioception: Appears Intact Coordination Gross Motor Movements are Fluid and Coordinated: Yes Motor  Motor Motor: Within Functional Limits     Trunk/Postural Assessment  Cervical Assessment Cervical Assessment: Within Functional Limits (slightly forward head) Thoracic Assessment Thoracic Assessment:  (rounded shoulders; milkd kyphosis) Lumbar Assessment Lumbar Assessment:  (posterior pelvic tilt)  Balance  Gait velocity = 0.58 m/sec  Balance Balance Assessed: Yes Berg Balance Test Sit to Stand: Able to stand  independently using hands Standing Unsupported: Able to stand safely 2 minutes Sitting with Back Unsupported but Feet Supported on Floor or Stool: Able to sit safely and securely 2 minutes Stand to Sit: Sits safely with minimal use of hands Transfers: Able to transfer safely, definite need of hands Standing Unsupported with Eyes Closed: Able to stand 10 seconds with supervision Standing Ubsupported with Feet Together: Able to place feet together independently and stand for 1 minute with supervision From Standing, Reach Forward with Outstretched Arm: Reaches forward but needs supervision From Standing Position, Pick up Object from Floor: Able to pick up shoe, needs supervision From Standing Position, Turn to Look Behind Over each  Shoulder: Looks behind from both sides and weight shifts well Turn 360 Degrees: Needs close supervision or verbal cueing Standing Unsupported, Alternately Place Feet on  Step/Stool: Able to complete >2 steps/needs minimal assist Standing Unsupported, One Foot in Front: Able to plae foot ahead of the other independently and hold 30 seconds Standing on One Leg: Tries to lift leg/unable to hold 3 seconds but remains standing independently Total Score: 38 Dynamic Standing Balance Dynamic Standing - Level of Assistance: 5: Stand by assistance    Five times Sit to Stand Test (FTSS) Method: Use a straight back chair with a solid seat that is 16-18" high. Ask participant to sit on the chair with arms folded across their chest.   Instructions: "Stand up and sit down as quickly as possible 5 times, keeping your arms folded across your chest."   Measurement: Stop timing when the participant stands the 5th time.  TIME: __16.8____ (in seconds)  Times > 13.6 seconds is associated with increased disability and morbidity (Guralnik, 2000) Times > 15 seconds is predictive of recurrent falls in healthy individuals aged 72 and older (Buatois, et al., 2008) Normal performance values in community dwelling individuals aged 61 and older (Bohannon, 2006): o 60-69 years: 11.4 seconds o 70-79 years: 12.6 seconds o 80-89 years: 14.8 seconds  MCID: ? 2.3 seconds for Vestibular Disorders (Meretta, 2006)   Extremity Assessment   see OT d/c summary for UE details   RLE Assessment RLE Assessment: Within Functional Limits LLE Assessment LLE Assessment: Within Functional Limits   See Function Navigator for Current Functional Status.  Canary Brim Ivory Broad, PT, DPT  09/25/2016, 10:47 AM

## 2016-09-25 NOTE — Progress Notes (Signed)
Occupational Therapy Discharge Summary  Patient Details  Name: Kristin Shaffer MRN: [id number] Date of Birth: 06/03/1931  Today's Date: 09/25/2016 OT Individual Time: 0800-0900 OT Individual Time Calculation (min): 60 min     Patient has met 11 of 11 long term goals due to improved activity tolerance, improved balance, improved attention, improved awareness and improved coordination.  Patient to discharge at overall Supervision level.  Patient's care partner is independent to provide the necessary physical and cognitive assistance at discharge.    Reasons goals not met: n/a  Recommendation:  Patient will benefit from ongoing skilled OT services in home health setting to continue to advance functional skills in the area of BADL and iADL.  Equipment: tub/transfer bench  Reasons for discharge: treatment goals met and discharge from hospital  Patient/family agrees with progress made and goals achieved: Yes  Skilled OT interventions:  1:1 OT session focused on patient/family education, functional transfers, home safety awareness, modified bathing/dressing, sequencing, and problem solving. Pt completed bathing/dressing with  daughter-in-law present and provided supervision and cues for safety when appropriate. Pt with improved ability to problem solve with tall-stack peg image copy task, requiring only min questioning cues to correct errors. Pt with noted improvement in R motor coordination, completing 9-hold peg test with only slight difference in R>L coordination. R -28.02, L 24.48. Pt is ready to dc home with supervision with ADL tasks.  ' OT Discharge Precautions/Restrictions Precautions Precautions: Fall Restrictions Weight Bearing Restrictions: No Pain Pain Assessment Pain Assessment: No/denies pain Pain Score: 0-No pain ADL ADL Upper Body Bathing: Supervision/safety Where Assessed-Upper Body Bathing: Shower Lower Body Bathing: Supervision/safety Where Assessed-Lower Body  Bathing: Shower Upper Body Dressing: Supervision/safety Where Assessed-Upper Body Dressing: Edge of bed Lower Body Dressing: Supervision/safety Where Assessed-Lower Body Dressing: Edge of bed Toileting: Supervision/safety Where Assessed-Toileting: Glass blower/designer: Close supervision Toilet Transfer Method: Ambulating Tub/Shower Transfer: Close supervison Clinical cytogeneticist Method: Optometrist: Radio broadcast assistant ADL Comments: See Surveyor, mining Overall Cognitive Status: Impaired/Different from baseline Arousal/Alertness: Awake/alert [place] [employer] of Cognitive Functioning: Purposeful/appropriate Sensation Sensation Light Touch: Appears Intact Proprioception: Appears Intact Coordination Gross Motor Movements are Fluid and Coordinated: Yes Fine Motor Movements are Fluid and Coordinated: Yes Motor  Motor Motor: Within Functional Limits Balance Dynamic Standing Balance Dynamic Standing - Balance Support: During functional activity Dynamic Standing - Level of Assistance: 5: Stand by assistance Extremity/Trunk Assessment RUE Assessment RUE Assessment: Within Functional Limits LUE Assessment LUE Assessment: Within Functional Limits   See Function Navigator for Current Functional Status.  Daneen Schick Kacey Dysert 09/25/2016, 12:12 PM

## 2016-09-25 NOTE — Discharge Instructions (Signed)
Inpatient Rehab Discharge Instructions  Kristin Shaffer Discharge date and time: 09/25/16   Activities/Precautions/ Functional Status: Activity: no lifting, driving, or strenuous exercise till cleared by  MD Diet: cardiac diet Wound Care: none needed   Functional status:  ___ No restrictions     ___ Walk up steps independently _X__ 24/7 supervision/assistance   ___ Walk up steps with assistance ___ Intermittent supervision/assistance  ___ Bathe/dress independently _X__ Walk with walker    _X__ Bathe/dress with assistance ___ Walk Independently    ___ Shower independently ___ Walk with assistance    ___ Shower with assistance _X__ No alcohol     ___ Return to work/school ________  Special Instructions: 1.  Family to assist with medication administration and financial tasks.  2. Needs 24 hours supervision for safety 3. Do not use Mobic, hydrocodone or Flexeril. See new medication list.    COMMUNITY REFERRALS UPON DISCHARGE:    Home Health:   PT, OT, RN, [place]   Date of last service:09/25/2016  Medical Equipment/Items Ordered:ROLLING WALKER, [place] FROM ACUTE  Agency/Supplier:[facility]   [phone]   GENERAL COMMUNITY RESOURCES FOR PATIENT/FAMILY: Support Groups:BI SUPPORT GROUP SECOND Tuesday OF [place] @ 7:00-8:00 PM  ON THE REHAB [place] [phone]   My questions have been answered and I understand these instructions. I will adhere to these goals and the provided educational materials after my discharge from the hospital.  Patient/Caregiver Signature _______________________________ Date __________  Clinician Signature _______________________________________ Date __________  Please bring this form and your medication list with you to all your follow-up doctor's appointments.

## 2016-09-25 NOTE — Progress Notes (Signed)
Speech Language Pathology Discharge Summary  Patient Details  Name: Kristin Shaffer MRN: [id number] Date of Birth: [birth year]  Today's Date: 09/25/2016 SLP Individual Time: 1035-1100 SLP Individual Time Calculation (min): 25 min  Skilled Therapeutic Interventions:  Pt was seen for skilled ST targeting cognitive goals.  SLP administered portions of the MoCA to measure progress from initial evaluation.  While pt is not yet back to baseline known from initial evaluation due to cognitive decline s/p UTI and medication effects, pt has demonstrated improvement since last week and was fully oriented and able to attend to all tasks with minimal assistance for redirection.  Pt was able to recall 4 out of 5 words on delayed recall subtest independently which improved to 5 out of 5 recall with min verbal cues.  Pt needed mod-max assist for task organization and awareness of errors on visuospatial/executive functioning subtests. Pt scored 2 out of 3 on serial subtraction subtest with errors most notable for decreased working memory for mental math calculations.  No cues needed for functional word finding both during evaluation and during functional conversations with SLP although her response time remains delayed.  Pt was left in recliner at the end of today's therapy session with quick release belt donned.  Pt is ready for discharge this afternoon.      Patient has met 4 of 4 long term goals.  Patient to discharge at overall Min;Mod level.  Reasons goals not met:     Clinical Impression/Discharge Summary:   Pt made slow, functional gains this reporting period and has met 4 out of 4 short term goals; however it should be mentioned that all pt's goals had to be downgraded shortly after initial evaluation secondary to cognitive decline in the setting of UTI and medication effects.  Pt is currently an overall min-mod assist level for basic tasks due to moderate cognitive impairment s/p TBI characterized by  decreased selective attention to tasks, decreased retrieval of information and poor recall of new information, decreased functional problem solving, decreased intellectual awareness of deficits, and decreased safety awareness.  Pt currently at a Rancho Level VIII at time of discharge.  Pt is discharging home with 24/7 supervision from family.  Pt and family education is complete at this time.  Given that pt was independent prior to admission, further ST intervention is warranted at next level of care to address cognitive function in order to facilitate return to pt's previous level of independence.    Care Partner:  Caregiver Able to Provide Assistance: Yes  Type of Caregiver Assistance: Physical;Cognitive  Recommendation:  24 hour supervision/assistance;Home Health SLP;Outpatient SLP  Rationale for SLP Follow Up: Maximize functional communication;Maximize cognitive function and independence;Reduce caregiver burden   Equipment: none recommended by SLP    Reasons for discharge: Discharged from hospital   Patient/Family Agrees with Progress Made and Goals Achieved: Yes   Function:  Eating Eating                 Cognition Comprehension Comprehension assist level: Follows basic conversation/direction with extra time/assistive device  Expression   Expression assist level: Expresses basic 90% of the time/requires cueing < 10% of the time.  Social Interaction Social Interaction assist level: Interacts appropriately 90% of the time - Needs monitoring or encouragement for participation or interaction.  Problem Solving Problem solving assist level: Solves basic 50 - 74% of the time/requires cueing 25 - 49% of the time  Memory Memory assist level: Recognizes or recalls 50 - 74% of the time/requires  cueing 25 - 49% of the time   Emilio Math 09/25/2016, 3:57 PM

## 2016-09-25 NOTE — Progress Notes (Signed)
Social Work  Discharge Note  The overall goal for the admission was met for:   Discharge location: Yes-HOME WITH SON AND DAUGHTER IN-LAW STAYING WITH AND PROVIDING 24 HR CARE  Length of Stay: Yes-12 DAYS  Discharge activity level: Yes-SUPERVISION LEVEL  Home/community participation: Yes  Services provided included: MD, RD, PT, OT, SLP, RN, CM, TR, Pharmacy and SW  Financial Services: Medicare and Private Insurance: [employer]  Follow-up services arranged: Home Health: [facility]-PT,OT,RN,SP, DME: [facility]-ROLLING WALKER & TUB BENCH and Patient/Family has no preference for HH/DME agencies  Comments (or additional information):CHILDREN HAVE BEEN HERE AND TRAINED IN THERAPIES, COMFORTABLE WITH PROVIDING 24 HR SUPERVISION LEVEL  Patient/Family verbalized understanding of follow-up arrangements: Yes  Individual responsible for coordination of the follow-up plan: BRUCE-SON  Confirmed correct DME delivered: Dupree, Rebecca G 09/25/2016    Dupree, Rebecca G 

## 2016-09-25 NOTE — Plan of Care (Signed)
Problem: RH Furniture Transfers Goal: LTG Patient will perform furniture transfers w/assist (OT/PT LTG: Patient will perform furniture transfers  with assistance (OT/PT).  Outcome: Not Met (add Reason) Supervision overall for cognition.

## 2016-09-25 NOTE — Progress Notes (Signed)
Pt discharged to home with family. Discharge instructions given and patient discharged with all belongings

## 2016-09-25 NOTE — Discharge Summary (Signed)
Kristin Shaffer, Kristin Shaffer               ACCOUNT NO.:  [id number]  MEDICAL RECORD NO.:  [id number]  LOCATION:  4M03C                        FACILITY:  [facility]  PHYSICIAN:  Delice Lesch, MD        DATE OF BIRTH:  06/19/1931  DATE OF ADMISSION:  09/13/2016 DATE OF DISCHARGE:  09/25/2016                              DISCHARGE SUMMARY   DISCHARGE DIAGNOSES: 1. Traumatic brain injury, subdural hematoma-subarachnoid hemorrhage     as well as maxillofacial fractures secondary to syncopal event. 2. SCDs for deep venous thrombosis prophylaxis. 3. Pain management. 4. Seizure prophylaxis. 5. Coronary artery disease with non-ST elevation myocardial     infarction. 6. Hypertension. 7. Hypothyroidism. 8. Hyperlipidemia. 9. Sleep insomnia. 10.E. coli urinary tract infection. 11.Acute kidney injury.  HISTORY OF PRESENT ILLNESS:  This is an 82 year old right-handed female, history of CAD, status post NSTEMI with stenting maintained on Plavix and aspirin.  Per chart review, She lives alone in [place], [place] independent with occasional cane and still driving.  Son and daughter in the area work.  Presented on September 09, 2016, after syncopal episode while working at [employer].  Denied any chest pain or shortness of breath.  Cranial CT scan showed extensive subarachnoid hemorrhage throughout the right sylvian fissure and lateral left temporal lobe.  Hemorrhagic contusion anterior right temporal tip and anterior frontal lobes, right greater than left.  A large right periorbital hematoma with extensive inflammatory changes.  Comminuted anterior maxillary sinus fracture with blood throughout the right maxillary sinus.  Minimally displaced frontal process maxillary fracture right nasal bone fracture.  CT cervical spine negative.  Troponin negative.  Neurosurgery consulted, advised conservative care.  Advised to resume aspirin in 1 week and Plavix in 2 weeks.  Dr. Wilburn Cornelia, also advised  conservative care and multiple facial fractures.  Echocardiogram for workup of syncope showed ejection fraction of 70%.  No wall motion abnormalities.  EEG severe diffuse cerebral dysfunction.  No seizure activity.  Maintained on Keppra for seizure prophylaxis.  Cardiology followup.  Plan for 30 day event monitor.  The patient was admitted for a comprehensive rehab program.  PAST MEDICAL HISTORY:  See discharge diagnoses.  SOCIAL HISTORY:  Lives alone, independent with occasional cane prior to admission.  Son and daughter in the area.  Functional history upon admission.  The patient minimal assist to mod assist, ambulate 80 feet rolling walker; minimal assist sit to stand; min to mod assist activities of daily living.  PHYSICAL EXAMINATION:  VITAL SIGNS:  Blood pressure 155/62, pulse 88, temperature 98, and respirations 19. GENERAL:  This was an alert female.  She was oriented to person and place as well as time. HEENT:  Multiple bruises and ecchymosis to the face.  Pupils round and reactive to light. LUNGS:  Clear to auscultation.  No wheeze. CARDIAC: Regular rhythm without murmur. ABDOMEN:  Soft, nontender.  Good bowel sounds.  Bilateral strength 4/5 hip flexors, 4- out of 5 knee extension, 4+ out of 5 ankle dorsiflexion and ankle plantar flexion.  REHABILITATION HOSPITAL COURSE:  The patient was admitted to inpatient rehab services with therapies initiated on a 3-hour daily basis consisting of physical therapy, occupational therapy,  and rehabilitation nursing.  The following issues were addressed during the patient's rehabilitation stay.  Pertaining to Kristin Shaffer's traumatic TBI, subdural hematoma-subarachnoid hemorrhage remained stable.  Followup conservative care per Dr. Cyndy Freeze.  Advised to resume aspirin, which had been restarted at 81 mg daily.  She would resume Plavix in approximately 1 week for history of CAD with stenting.  SCDs for DVT prophylaxis.  Pain management with  the use of Tylenol as needed.  She remained on Keppra for seizure prophylaxis.  EEG negative.  Keppra was decreased to 250 mg twice daily at family's insistence, felt to be causing some confusion. Cranial CT scan was completed that showed no acute changes.  Blood pressure is well controlled.  No chest pain or shortness of breath. Noted sleep disturbance insomnia, trazodone was initiated increased to 100 mg.  she was sleeping much better at night.  She was also treated for an E. coli urinary tract infection with Macrobid initiated on September 20, 2016.  Mild acute renal insufficiency, creatinine 1.15.  The patient received weekly collaborative interdisciplinary team conferences to discuss estimated length of stay, family teaching, and any barriers to her discharge.  She was ambulating from her room to the gym with rolling walker, supervision without cuing for path finding.  Utilized NuStep with all 4 extremities for endurance and strengthening as well as energy conservation techniques.  Required some cuing for safe hand placement for sit to stand transfers.  The patient could gather her belongings for activities of daily living and homemaking needing some extra time to complete tasks.  Working on improved awareness and attention.  Her cognition was improving. The patient required close supervision to modulate her pace of activity and to manage supply of the materials during entire session. All issues in regard to the need for supervision for the patient were discussed with family.  She was discharged to home.  DISCHARGE MEDICATIONS:  Included: 1. Aspirin 81 mg p.o. daily. 2. Lipitor 20 mg p.o. daily. 3. Colace 100 mg p.o. b.i.d. 4. Zetia 5 mg p.o. daily. 5. Keppra 250 mg p.o. b.i.d. 6. Synthroid 100 mcg p.o. daily. 7. Lisinopril 10 mg p.o. daily. 8. Lopressor 25 mg p.o. b.i.d. 9. Macrobid 100 mg p.o. every 12 hours, completing a 7-day course. 10.Protonix 40 mg p.o. daily. 11.Trazodone  100 mg p.o. at bedtime, hold for sedation. 12.Nitroglycerin as needed. She would follow up with Dr. Delice Lesch at the outpatient rehab service office as directed; Dr. Marland Kitchen Ditty, Neurosurgery, call for appointment; Dr. Jerrell Belfast, call for appointment; Dr. Lauree Chandler, Cardiology Service, in regard to a 30-day monitor; and Dr. Juanita Craver, medical management.     Lauraine Rinne, P.A.   ______________________________ Delice Lesch, MD    DA/MEDQ  D:  09/24/2016  T:  09/25/2016  Job:  [id number]  cc:   Early Chars. Wilburn Cornelia, M.D. [facility] Juanita Craver, M.D. Delice Lesch, MD Lauree Chandler, MD

## 2016-09-25 NOTE — Progress Notes (Signed)
Subjective/Complaints: Pt working with OT this AM.  She slept well overnight and appears more alert with quicker responses.  She states she is ready to go home.    ROS: Denies CP, SOB, N/V/D.   Objective: Vital Signs: Blood pressure (!) 146/57, pulse 64, temperature 97.7 F (36.5 C), temperature source Oral, resp. rate 18, weight 78.5 kg (173 lb 1 oz), SpO2 95 %. No results found. No results found for this or any previous visit (from the past 72 hour(s)).   HEENT: Right Periorbital bruising and edema (improving).  Cardio: RRR, No JVD. Resp: CTA B/L and unlabored, no tenderness GI: BS positive and NT, ND Skin:   Bruise maxillary, improving. Warm and dry otherwise  Neuro: Alert and Oriented x3 Motor: 4+/5 throughout  Musc/Skel:  No edema, no tenderness in extremities. Gen NAD.  Vital signs reviewed.   Assessment/Plan: 1. Functional deficits secondary to TBI/SDH/SAH which require 3+ hours per day of interdisciplinary therapy in a comprehensive inpatient rehab setting. Physiatrist is providing close team supervision and 24 hour management of active medical problems listed below. Physiatrist and rehab team continue to assess barriers to discharge/monitor patient progress toward functional and medical goals. FIM: Function - Bathing Position: Shower Body parts bathed by patient: Right arm, Left arm, Chest, Abdomen, Front perineal area, Buttocks, Right upper leg, Left upper leg, Right lower leg, Left lower leg Body parts bathed by helper: Back Assist Level: Supervision or verbal cues  Function- Upper Body Dressing/Undressing What is the patient wearing?: Bra, Pull over shirt/dress Bra - Perfomed by patient: Thread/unthread right bra strap, Thread/unthread left bra strap, Hook/unhook bra (pull down sports bra) Bra - Perfomed by helper: Hook/unhook bra (pull down sports bra) Pull over shirt/dress - Perfomed by patient: Thread/unthread right sleeve, Thread/unthread left sleeve, Put  head through opening, Pull shirt over trunk Assist Level: Supervision or verbal cues, Set up Set up : To obtain clothing/put away Function - Lower Body Dressing/Undressing What is the patient wearing?: Pants, Underwear, Socks, Shoes Position: Sitting EOB Underwear - Performed by patient: Thread/unthread right underwear leg, Thread/unthread left underwear leg, Pull underwear up/down Underwear - Performed by helper: Thread/unthread right underwear leg Pants- Performed by patient: Thread/unthread right pants leg, Thread/unthread left pants leg, Pull pants up/down Pants- Performed by helper: Thread/unthread right pants leg Non-skid slipper socks- Performed by patient: Don/doff right sock, Don/doff left sock Socks - Performed by patient: Don/doff right sock, Don/doff left sock Socks - Performed by helper: Don/doff left sock, Don/doff right sock Shoes - Performed by patient: Don/doff right shoe, Don/doff left shoe Shoes - Performed by helper: Don/doff left shoe, Don/doff right shoe Assist for footwear: Supervision/touching assist Assist for lower body dressing: Set up, Supervision or verbal cues Set up : To obtain clothing/put away  Function - Toileting Toileting steps completed by patient: Adjust clothing prior to toileting, Performs perineal hygiene, Adjust clothing after toileting Toileting steps completed by helper: Adjust clothing after toileting Toileting Assistive Devices: Grab bar or rail Assist level: Supervision or verbal cues  Function - Toilet Transfers Toilet transfer assistive device: Walker, Elevated toilet seat/BSC over toilet Assist level to toilet: Supervision or verbal cues Assist level from toilet: Supervision or verbal cues  Function - Chair/bed transfer Chair/bed transfer method: Ambulatory Chair/bed transfer assist level: Supervision or verbal cues Chair/bed transfer assistive device: Armrests, Walker Chair/bed transfer details: Verbal cues for technique, Verbal  cues for precautions/safety, Verbal cues for safe use of DME/AE  Function - Locomotion: Wheelchair Will patient use wheelchair at  discharge?: No Function - Locomotion: Ambulation Assistive device: Walker-rolling Max distance: 100 ft Assist level: Supervision or verbal cues Assist level: Supervision or verbal cues Assist level: Supervision or verbal cues Assist level: Supervision or verbal cues Assist level: Supervision or verbal cues  Function - Comprehension Comprehension: Auditory Comprehension assist level: Follows basic conversation/direction with extra time/assistive device  Function - Expression Expression: Verbal Expression assist level: Expresses basic 90% of the time/requires cueing < 10% of the time.  Function - Social Interaction Social Interaction assist level: Interacts appropriately 90% of the time - Needs monitoring or encouragement for participation or interaction.  Function - Problem Solving Problem solving assist level: Solves basic 25 - 49% of the time - needs direction more than half the time to initiate, plan or complete simple activities  Function - Memory Memory assist level: Recognizes or recalls 75 - 89% of the time/requires cueing 10 - 24% of the time Patient normally able to recall (first 3 days only): Current season, That he or she is in a hospital  Medical Problem List and Plan: 1.  Traumatic TBI /SDH/SAH as well as maxillofacial fractures secondary to syncopal event.  D/c today after therapies  Cognition improving  Will see pt for transitional care management in 1-2 weeks 2.  DVT Prophylaxis/Anticoagulation: SCDs. Monitor for any signs of DVT 3. Pain Management: Flexeril as needed for muscle spasms as prior to admission.  -tylenol prn also.   -offered Kpad 4. Seizure prophylaxis. Keppra 500 mg twice a day, decreased to 250 BID on 10/23 per family insistance. EEG negative 5. Neuropsych: This patient is capable of making decisions on her own  behalf. 6. Skin/Wound Care: Routine skin checks 7. Fluids/Electrolytes/Nutrition: Routine I&Os   Dietitian consult 8. CAD/NSTEMI status post stenting.   Cont aspirin, started 10/24   Will plan to start Plavix at discharge    No chest pain or shortness of breath 9. Hypertension.   Lisinopril 10 mg daily  Lopressor 12.5 mg twice a day, increased to 25.   Relatively controlled  Vitals:   09/25/16 0743 09/25/16 0744  BP: (!) 146/57 (!) 146/57  Pulse: 64   Resp:    Temp:    10. Hypothyroidism. Synthroid. 11. Hyperlipidemia.Zetia/Lipitor 12. Sleep disturbance  Resulting in fatigue and increasing cognitive difficulties as day progresses  Trazodone scheduled, increased to 100 10/24, improved with increased dose 13. UTI   Ucx with 100k E coli--sens to macrobid (started 10/25) 14. AKI  Cr. 1.15 on 10/25  push fluid intake      LOS (Days) 12 A FACE TO FACE EVALUATION WAS PERFORMED  Mauri Tolen Lorie Phenix 09/25/2016, 8:51 AM

## 2016-09-27 ENCOUNTER — Telehealth: Payer: Self-pay | Admitting: *Deleted

## 2016-09-27 NOTE — Telephone Encounter (Signed)
Transitional care call completed, appointment confirmed, address confirmed, new visit packet sent  Transitional Care Questions  Questions for our staff to ask patients on Transitional care 48 hour phone call:   1. Are you/is patient experiencing any problems since coming home? No Are there any questions regarding any aspect of care? No  2. Are there any questions regarding medications administration/dosing? No Are meds being taken as prescribed?  Yes Patient should review meds with caller to confirm   3. Have there been any falls? No  4. Has Home Health been to the house and/or have they contacted you? Yes, physical therapist and a nurse  If not, have you tried to contact them? Can we help you contact them?   5. Are bowels and bladder emptying properly? Yes Are there any unexpected incontinence issues? No If applicable, is patient following bowel/bladder programs?  6. Any fevers, problems with breathing, unexpected pain? No   7. Are there any skin problems or new areas of breakdown? No  8. Has the patient/family member arranged specialty MD follow up (ie cardiology/neurology/renal/surgical/etc)? Yes Can we help arrange?  No   9. Does the patient need any other services or support that we can help arrange? No  10. Are caregivers following through as expected in assisting the patient? Yes   11. Has the patient quit smoking, drinking alcohol, or using drugs as recommended? Patient does not smoke, drink, or use illicit drugs

## 2016-10-02 ENCOUNTER — Other Ambulatory Visit: Payer: Self-pay | Admitting: Neurological Surgery

## 2016-10-02 ENCOUNTER — Telehealth: Payer: Self-pay | Admitting: Internal Medicine

## 2016-10-02 DIAGNOSIS — S0990XA Unspecified injury of head, initial encounter: Secondary | ICD-10-CM

## 2016-10-02 NOTE — Telephone Encounter (Signed)
I am having trouble reaching this patient when dialing available number. Phone disconnected x2 attempts. Will reattempt call later.

## 2016-10-02 NOTE — Telephone Encounter (Signed)
Please call,needs to know if pt needs to be on Plavix.She had a fall,been in the hospital. They stopped her [place] discharged the doctor put her back on her Plavix. A few days later she went to see Pa,he told her to stop her Plavix. Pt have not stopped her Plavix ,because the doctor at the hospital told her to take it. Please call to advise.

## 2016-10-02 NOTE — Telephone Encounter (Signed)
Yes, please continue Plavix. MCr

## 2016-10-02 NOTE — Telephone Encounter (Signed)
Was able to speak with son of patient, Darnell Level, listed DPR. Confirms that as instructed after leaving hospital, patient has been taking her Plavix daily.  She received conflicting recommendations by a PA at PCP office to hold plavix until instructed by cardiology to resume. However, he noted patient was cleared by neurology to restart med at discharge from hospital after successive CTs all showed neg for intracranial bleed. Informed him I would get clarification from Dr. Sallyanne Kuster, covering Dr. Lysbeth Penner assignment, but that in this case I think cardiology would affirm hospital discharge instructions.

## 2016-10-02 NOTE — Telephone Encounter (Signed)
Recommendations communicated to caller, who voiced understanding and thanks.

## 2016-10-02 NOTE — Telephone Encounter (Signed)
Call drops/hangs without connecting.

## 2016-10-04 ENCOUNTER — Encounter: Payer: Medicare Other | Attending: Physical Medicine & Rehabilitation | Admitting: Physical Medicine & Rehabilitation

## 2016-10-04 ENCOUNTER — Encounter: Payer: Self-pay | Admitting: Physical Medicine & Rehabilitation

## 2016-10-04 VITALS — BP 156/74 | HR 55 | Resp 14

## 2016-10-04 DIAGNOSIS — Z801 Family history of malignant neoplasm of trachea, bronchus and lung: Secondary | ICD-10-CM | POA: Insufficient documentation

## 2016-10-04 DIAGNOSIS — Z87440 Personal history of urinary (tract) infections: Secondary | ICD-10-CM | POA: Insufficient documentation

## 2016-10-04 DIAGNOSIS — Z7982 Long term (current) use of aspirin: Secondary | ICD-10-CM | POA: Insufficient documentation

## 2016-10-04 DIAGNOSIS — F068 Other specified mental disorders due to known physiological condition: Secondary | ICD-10-CM | POA: Diagnosis not present

## 2016-10-04 DIAGNOSIS — Z809 Family history of malignant neoplasm, unspecified: Secondary | ICD-10-CM | POA: Diagnosis not present

## 2016-10-04 DIAGNOSIS — M1990 Unspecified osteoarthritis, unspecified site: Secondary | ICD-10-CM | POA: Diagnosis not present

## 2016-10-04 DIAGNOSIS — Z955 Presence of coronary angioplasty implant and graft: Secondary | ICD-10-CM | POA: Diagnosis not present

## 2016-10-04 DIAGNOSIS — N390 Urinary tract infection, site not specified: Secondary | ICD-10-CM | POA: Diagnosis not present

## 2016-10-04 DIAGNOSIS — Z87820 Personal history of traumatic brain injury: Secondary | ICD-10-CM | POA: Insufficient documentation

## 2016-10-04 DIAGNOSIS — E039 Hypothyroidism, unspecified: Secondary | ICD-10-CM | POA: Diagnosis not present

## 2016-10-04 DIAGNOSIS — Z8249 Family history of ischemic heart disease and other diseases of the circulatory system: Secondary | ICD-10-CM | POA: Diagnosis not present

## 2016-10-04 DIAGNOSIS — I2510 Atherosclerotic heart disease of native coronary artery without angina pectoris: Secondary | ICD-10-CM | POA: Diagnosis not present

## 2016-10-04 DIAGNOSIS — I219 Acute myocardial infarction, unspecified: Secondary | ICD-10-CM | POA: Insufficient documentation

## 2016-10-04 DIAGNOSIS — I252 Old myocardial infarction: Secondary | ICD-10-CM | POA: Diagnosis not present

## 2016-10-04 DIAGNOSIS — G479 Sleep disorder, unspecified: Secondary | ICD-10-CM | POA: Diagnosis not present

## 2016-10-04 DIAGNOSIS — E7800 Pure hypercholesterolemia, unspecified: Secondary | ICD-10-CM | POA: Diagnosis not present

## 2016-10-04 DIAGNOSIS — I10 Essential (primary) hypertension: Secondary | ICD-10-CM | POA: Insufficient documentation

## 2016-10-04 DIAGNOSIS — S069X0S Unspecified intracranial injury without loss of consciousness, sequela: Secondary | ICD-10-CM

## 2016-10-04 DIAGNOSIS — Z9842 Cataract extraction status, left eye: Secondary | ICD-10-CM | POA: Diagnosis not present

## 2016-10-04 DIAGNOSIS — R55 Syncope and collapse: Secondary | ICD-10-CM | POA: Diagnosis not present

## 2016-10-04 DIAGNOSIS — K219 Gastro-esophageal reflux disease without esophagitis: Secondary | ICD-10-CM | POA: Insufficient documentation

## 2016-10-04 DIAGNOSIS — F419 Anxiety disorder, unspecified: Secondary | ICD-10-CM | POA: Insufficient documentation

## 2016-10-04 DIAGNOSIS — Z85828 Personal history of other malignant neoplasm of skin: Secondary | ICD-10-CM | POA: Diagnosis not present

## 2016-10-04 DIAGNOSIS — Z9841 Cataract extraction status, right eye: Secondary | ICD-10-CM | POA: Insufficient documentation

## 2016-10-04 NOTE — Progress Notes (Signed)
Subjective:    Patient ID: Kristin Shaffer, female    DOB: 01-12-1931, 82 y.o.   MRN: [id number]  HPI  82 year old right-handed female, history of CAD, status post NSTEMI presents for transitional care management after TBI. DATE OF ADMISSION:  09/13/2016 DATE OF DISCHARGE:  09/25/2016  Presents with son, who provides history. At that time her cognition was improving and she believes it has continued to improve.  She does not have pain.  She is still taking Keppra. She is now taking ASA and Plavix.  She is taking BP meds. She is sleeping well with medication.  She completed course for UTI.  She had another UTI after and was started on another medication. She has an appointment with Neurosurg on 10/12/16.   She saw ENT and was released.  She has an appointment with Cards.  She saw her PCP.  Overall pt and son state pt is doing well.    DME: Bench seat, bedside commode Mobility: Walker (does not use), cane for safety Therapies: Discharged from PT/OT, Cont SLP  Pain Inventory Average Pain 0 Pain Right Now 0 My pain is no pain  In the last 24 hours, has pain interfered with the following? General activity 0 Relation with others 0 Enjoyment of life 0 What TIME of day is your pain at its worst? no pain Sleep (in general) NA  Pain is worse with: no pain Pain improves with: no pain Relief from Meds: no pain  Mobility use a cane  Function retired  Neuro/Psych No problems in this area  Prior Studies Any changes since last visit?  no  Physicians involved in your care Any changes since last visit?  no   Family History  Problem Relation Age of Onset  . Dementia Mother   . Lung cancer Father   . CAD Father 39  . Cancer Brother    Social History   Social History  . Marital status: Widowed    Spouse name: N/A  . Number of children: N/A  . Years of education: N/A   Occupational History  . retired     Social History Main Topics  . Smoking status: Never Smoker  .  Smokeless tobacco: Never Used  . Alcohol use No  . Drug use: No  . Sexual activity: No   Other Topics Concern  . None   Social History Narrative   Lives alone.    Past Surgical History:  Procedure Laterality Date  . APPENDECTOMY  2003  . BREAST BIOPSY Bilateral 1990s   "total of 5; 2 on one side, 3 on the other; all benign" (03/20/2013)  . CATARACT EXTRACTION W/ INTRAOCULAR LENS  IMPLANT, BILATERAL Bilateral ~ 2008  . CORONARY ANGIOPLASTY  12/08/2013  . CORONARY ANGIOPLASTY WITH STENT PLACEMENT  03/20/2013   NSTEMI - subtotal occlusion of mid RCA - PCI of mid RCA of long tubular 40-60% lesion - 2 tandem 95-99% subtotal occlusions - 3 overlapping Xience Xpedition DES (Dr. Roni Bread)   . [place] OF UTERUS  1956?  Marland Kitchen LEFT HEART CATHETERIZATION WITH CORONARY ANGIOGRAM N/A 03/20/2013   Procedure: LEFT HEART CATHETERIZATION WITH CORONARY ANGIOGRAM;  Surgeon: Sanda Klein, MD;  Location: [facility] CATH LAB;  Service: Cardiovascular;  Laterality: N/A;  . LEFT HEART CATHETERIZATION WITH CORONARY ANGIOGRAM N/A 12/08/2013   Procedure: LEFT HEART CATHETERIZATION WITH CORONARY ANGIOGRAM;  Surgeon: Blane Ohara, MD;  Location: [facility] CATH LAB;  Service: Cardiovascular;  Laterality: N/A;  . SKIN CANCER EXCISION     "  1 off right arm; 2 off each leg" (03/20/2013)  . TRANSTHORACIC ECHOCARDIOGRAM  03/2013   EF [id number], grade 1 diastolic dysfunction; mild MR; calcifed MV annulus; LA mildly dilated   Past Medical History:  Diagnosis Date  . Anxiety   . Arthritis    "different places; not bad" (03/20/2013)  . Coronary artery disease   . Exertional shortness of breath   . GERD (gastroesophageal reflux disease)   . High cholesterol   . Hypertension   . Hypothyroidism   . Myocardial infarction    "Dr's saw evidence I might have had a heart attack" (03/20/2013)  . NSTEMI (non-ST elevated myocardial infarction) ([place]) 03/20/2013   cath - mid RCA  . Pneumonia 2012  . Skin cancer    "both legs; right  arm" (03/20/2013)   BP (!) 156/74   Pulse (!) 55   Resp 14   SpO2 97%   Opioid Risk Score:   Fall Risk Score:  `1  Depression screen PHQ 2/9  Depression screen PHQ 2/9 10/04/2016  Decreased Interest 0  Down, Depressed, Hopeless 0  PHQ - 2 Score 0  Altered sleeping 0  Tired, decreased energy 0  Change in appetite 0  Feeling bad or failure about yourself  0  Trouble concentrating 0  Moving slowly or fidgety/restless 0  Suicidal thoughts 0  PHQ-9 Score 0    Review of Systems  Neurological: Negative for weakness and headaches.  Psychiatric/Behavioral: Negative for agitation and dysphoric mood.  All other systems reviewed and are negative.      Objective:   Physical Exam HEENT: Right Periorbital bruising and edema (significantly improved).  Cardio: RRR. No JVD. Resp: CTA B/L and unlabored, no tenderness GI: BS positive and NT, ND Skin:   Bruise maxillary, continues to improve. Warm and dry otherwise  Neuro: Alert and Oriented x3, more alert, interactive and appropriate.  Motor: 4+-5/5 throughout  Musc/Skel:  No edema, no tenderness in extremities. Gen NAD.  Vital signs reviewed.     Assessment & Plan:  82 year old right-handed female, history of CAD, status post NSTEMI presents for transitional care management after TBI.  1.  Traumatic TBI /SDH/SAH as well as maxillofacial fractures secondary to syncopal event.  Cognition continues to improve  Cont seizure prophylaxis.   Cont meds  Follow up Neurosurg  Cont SLP  2. CAD/NSTEMI status post stenting.              Cont meds  Cont follow up with Cards  3. Hypertension.              Elevated today, however, per pt and son WNL at home, cont meds, cont to monitor  4. Sleep disturbance  Will wean Trazodone to 50mg  x7 days, then d/c  5. Recurrent UTI  Complete course of abx  Meds reviewed Referral reviewed All questions answered

## 2016-10-05 ENCOUNTER — Ambulatory Visit
Admission: RE | Admit: 2016-10-05 | Discharge: 2016-10-05 | Disposition: A | Discharge status: Home / Self Care | Payer: Medicare Other | Source: Ambulatory Visit | Attending: Neurological Surgery | Admitting: Neurological Surgery

## 2016-10-05 DIAGNOSIS — S0990XA Unspecified injury of head, initial encounter: Secondary | ICD-10-CM

## 2016-10-06 DIAGNOSIS — S02401D Maxillary fracture, unspecified, subsequent encounter for fracture with routine healing: Secondary | ICD-10-CM

## 2016-10-06 DIAGNOSIS — S022XXD Fracture of nasal bones, subsequent encounter for fracture with routine healing: Secondary | ICD-10-CM | POA: Diagnosis not present

## 2016-10-06 DIAGNOSIS — S062X0D Diffuse traumatic brain injury without loss of consciousness, subsequent encounter: Secondary | ICD-10-CM | POA: Diagnosis not present

## 2016-10-06 DIAGNOSIS — S065X0D Traumatic subdural hemorrhage without loss of consciousness, subsequent encounter: Secondary | ICD-10-CM | POA: Diagnosis not present

## 2016-10-25 ENCOUNTER — Other Ambulatory Visit: Payer: Self-pay | Admitting: Internal Medicine

## 2016-11-15 ENCOUNTER — Telehealth: Payer: Self-pay | Admitting: Internal Medicine

## 2016-11-15 MED ORDER — EZETIMIBE 10 MG PO TABS: 5.0000 mg | ORAL_TABLET | Freq: Every day | ORAL | 1 refills | Status: DC

## 2016-11-15 NOTE — Telephone Encounter (Signed)
Patient taking Zetia and states medicine is expensive and wants to know if she needs to continue to take before she orders it.

## 2016-11-15 NOTE — Telephone Encounter (Signed)
Patient states she needs refill for zetia but was unsure if she needed to continue taking this. Informed her that her statin was increased at her visit last year based on lab results, so it is likely she would need to continue on this. She requested #30 to be sent to [place]. Rx(s) sent to pharmacy electronically. Patient scheduled for 6 month visit 12/08/16.

## 2016-11-24 ENCOUNTER — Other Ambulatory Visit: Payer: Self-pay | Admitting: Internal Medicine

## 2016-12-08 ENCOUNTER — Ambulatory Visit: Payer: Medicare Other | Admitting: Internal Medicine

## 2016-12-22 ENCOUNTER — Ambulatory Visit (INDEPENDENT_AMBULATORY_CARE_PROVIDER_SITE_OTHER)
Admission: RE | Admit: 2016-12-22 | Discharge: 2016-12-22 | Disposition: A | Discharge status: Home / Self Care | Payer: Medicare Other | Source: Ambulatory Visit | Attending: Pulmonary Disease | Admitting: Pulmonary Disease

## 2016-12-22 ENCOUNTER — Ambulatory Visit: Payer: Medicare Other | Admitting: Pulmonary Disease

## 2016-12-22 DIAGNOSIS — J849 Interstitial pulmonary disease, unspecified: Secondary | ICD-10-CM | POA: Diagnosis not present

## 2016-12-29 ENCOUNTER — Encounter: Payer: Self-pay | Admitting: Pulmonary Disease

## 2016-12-29 ENCOUNTER — Ambulatory Visit (INDEPENDENT_AMBULATORY_CARE_PROVIDER_SITE_OTHER): Payer: Medicare Other | Admitting: Pulmonary Disease

## 2016-12-29 VITALS — BP 136/64 | HR 79 | Ht 64.5 in | Wt 180.0 lb

## 2016-12-29 DIAGNOSIS — K219 Gastro-esophageal reflux disease without esophagitis: Secondary | ICD-10-CM

## 2016-12-29 DIAGNOSIS — J849 Interstitial pulmonary disease, unspecified: Secondary | ICD-10-CM

## 2016-12-29 DIAGNOSIS — J8410 Pulmonary fibrosis, unspecified: Secondary | ICD-10-CM | POA: Diagnosis not present

## 2016-12-29 DIAGNOSIS — R059 Cough, unspecified: Secondary | ICD-10-CM

## 2016-12-29 DIAGNOSIS — I10 Essential (primary) hypertension: Secondary | ICD-10-CM | POA: Diagnosis not present

## 2016-12-29 DIAGNOSIS — R05 Cough: Secondary | ICD-10-CM

## 2016-12-29 LAB — PULMONARY FUNCTION TEST
DL/VA % pred: 84 %
DL/VA: 4.12 ml/min/mmHg/L
DLCO UNC % PRED: 69 %
DLCO UNC: 17.28 ml/min/mmHg
DLCO cor % pred: 67 %
DLCO cor: 16.74 ml/min/mmHg
FEF 25-75 PRE: 2.96 L/s
FEF 25-75 Post: 1.8 L/sec
FEF2575-%Change-Post: -39 %
FEF2575-%PRED-POST: 146 %
FEF2575-%Pred-Pre: 240 %
FEV1-%Change-Post: -12 %
FEV1-%PRED-POST: 92 %
FEV1-%PRED-PRE: 105 %
FEV1-POST: 1.71 L
FEV1-PRE: 1.94 L
FEV1FVC-%Change-Post: 2 %
FEV1FVC-%PRED-PRE: 125 %
FEV6-%Change-Post: -14 %
FEV6-%PRED-POST: 77 %
FEV6-%Pred-Pre: 90 %
FEV6-Post: 1.82 L
FEV6-Pre: 2.13 L
FEV6FVC-%PRED-POST: 106 %
FEV6FVC-%Pred-Pre: 106 %
FVC-%Change-Post: -14 %
FVC-%Pred-Post: 72 %
FVC-%Pred-Pre: 85 %
FVC-Post: 1.82 L
FVC-Pre: 2.13 L
PRE FEV1/FVC RATIO: 91 %
PRE FEV6/FVC RATIO: 100 %
Post FEV1/FVC ratio: 94 %
Post FEV6/FVC ratio: 100 %
RV % PRED: 79 %
RV: 2 L
TLC % PRED: 85 %
TLC: 4.38 L

## 2016-12-29 MED ORDER — LOSARTAN POTASSIUM 50 MG PO TABS: 50.0000 mg | ORAL_TABLET | Freq: Every day | ORAL | 2 refills | Status: DC

## 2016-12-29 NOTE — Assessment & Plan Note (Signed)
This has been a lifelong problem for her. Today we discussed lifestyle modification and taking over-the-counter famotidine. She said that omeprazole wasn't helpful in the past.

## 2016-12-29 NOTE — Assessment & Plan Note (Signed)
Nonspecific findings on CT scan of her chest. However, this problem is worsening as her PFTs have declined. I explained to them at length today that her findings on CT chest are not consistent with usual interstitial pneumonitis. Based on her clinical history I suspect acid reflux is contributing if not the only cause.  Plan: Repeat on infection test in 6 months Gastroesophageal reflux disease lifestyle modification changes recommended today Famotidine twice a day

## 2016-12-29 NOTE — Progress Notes (Signed)
Subjective:    Patient ID: Kristin Shaffer, female    DOB: May 13, 1931, 83 y.o.   MRN: [id number]  Synopsis: Referred in 2017 for evaluation of non-specific ILD.  She has acid reflux.    HPI Chief Complaint  Patient presents with  . Follow-up    review pft.  pt states she has been doing well, does note sob with exertion.     She was hospitalized and has been exercising some since then.  She is limited in her mobility since.  She is not limited in terms of shortness of breath.  She says that sometimes she will notice shortness of breath if she moves heavy objects.  Her son notes that she moves fast all the time.  She is not limiting activity due to breath.  She has a dry cough primarily in the evenings.  The cough doesn't seem to correlate with meals.  It isn't necessarily worse when she lies flat but she says that it is worse when she goes to bed.  No sinus congestion.  She has acid reflux.   She was given macrobid in October, but she has not taken this since then.     Past Medical History:  Diagnosis Date  . Anxiety   . Arthritis    "different places; not bad" (03/20/2013)  . Coronary artery disease   . Exertional shortness of breath   . GERD (gastroesophageal reflux disease)   . High cholesterol   . Hypertension   . Hypothyroidism   . Myocardial infarction    "Dr's saw evidence I might have had a heart attack" (03/20/2013)  . NSTEMI (non-ST elevated myocardial infarction) ([place]) 03/20/2013   cath - mid RCA  . Pneumonia 2012  . Skin cancer    "both legs; right arm" (03/20/2013)      Review of Systems  Constitutional: Negative for chills, fatigue and fever.  HENT: Negative for rhinorrhea, sinus pain and sinus pressure.   Respiratory: Positive for cough. Negative for choking and shortness of breath.   Cardiovascular: Negative for chest pain, palpitations and leg swelling.       Objective:   Physical Exam Vitals:   12/29/16 1044  BP: 136/64  Pulse: 79  SpO2: 98%    Weight: 180 lb (81.6 kg)  Height: 5' 4.5" (1.638 m)  RA  Gen: well appearing HENT: OP clear, TM's clear, neck supple PULM: CTA B, normal percussion CV: RRR, no mgr, trace edema GI: BS+, soft, nontender Derm: no cyanosis or rash Psyche: normal mood and affect   2017 CT chest with fine groundglass throughout, mild bronchiolectasis, some interlobular septal thickening in the periphery  Other 05/2016 ANA, CCP, RA, SCL 70, Jo-1, SSA, SSB, RF, and aldolase [place] all negative 05/2016 PFT> Ratio normal, FVC 2.47L (98% pred), TLC 4.94 (96% pred), DLCO 18.77 (75% pred) January 2018 high-resolution CT scan of the chest with some mild interstitial changes in the bases, favored to represent nonspecific interstitial pneumonia, 3 vessel CAD  October 2017 hospital discharge summary reviewed: She had traumatic brain injury with subdural hematoma and subarachnoid hemorrhage as well as multiple facial fractures.  PFT February 2018 pulmonary function testing ratio 94%, FVC 2.13 L 85% predicted, total lung capacity 4.38 L 85% predicted, DLCO 17.28 69% predicted     Assessment & Plan:  Cough She has a nighttime dry cough with I think is likely related to acid reflux. However, lisinopril massive be contributing. Stop lisinopril. Start losartan.  Essential hypertension As above.  GERD (gastroesophageal reflux disease) This has been a lifelong problem for her. Today we discussed lifestyle modification and taking over-the-counter famotidine. She said that omeprazole wasn't helpful in the past.  Pulmonary fibrosis ([place]) Nonspecific findings on CT scan of her chest. However, this problem is worsening as her PFTs have declined. I explained to them at length today that her findings on CT chest are not consistent with usual interstitial pneumonitis. Based on her clinical history I suspect acid reflux is contributing if not the only cause.  Plan: Repeat on infection test in 6 months Gastroesophageal  reflux disease lifestyle modification changes recommended today Famotidine twice a day     Current Outpatient Prescriptions:  .  aspirin EC 81 MG tablet, Take 81 mg by mouth daily., Disp: , Rfl:  .  atorvastatin (LIPITOR) 20 MG tablet, Take 1 tablet (20 mg total) by mouth daily., Disp: 90 tablet, Rfl: 3 .  Ca Carbonate-Mag Hydroxide (ROLAIDS) 550-110 MG CHEW, Chew 1 tablet by mouth daily as needed (heartburn). , Disp: , Rfl:  .  CALCIUM-VITAMIN D PO, Take 1 tablet by mouth daily., Disp: , Rfl:  .  clopidogrel (PLAVIX) 75 MG tablet, Take 1 tablet (75 mg total) by mouth daily., Disp: 30 tablet, Rfl: 0 .  ezetimibe (ZETIA) 10 MG tablet, Take 0.5 tablets (5 mg total) by mouth daily., Disp: 30 tablet, Rfl: 1 .  levothyroxine (SYNTHROID, LEVOTHROID) 100 MCG tablet, Take 100 mcg by mouth daily before breakfast., Disp: , Rfl:  .  lisinopril (PRINIVIL,ZESTRIL) 10 MG tablet, TAKE 1 TABLET BY MOUTH EVERY DAY, Disp: 90 tablet, Rfl: 2 .  Magnesium Oxide 250 MG TABS, Take 1 tablet by mouth daily., Disp: , Rfl:  .  metoprolol tartrate (LOPRESSOR) 25 MG tablet, TAKE 1/2 TABLET TWICE DAILY, Disp: 90 tablet, Rfl: 2 .  nitroGLYCERIN (NITROSTAT) 0.4 MG SL tablet, Place 1 tablet (0.4 mg total) under the tongue every 5 (five) minutes as needed for chest pain., Disp: 30 tablet, Rfl: 2 .  losartan (COZAAR) 50 MG tablet, Take 1 tablet (50 mg total) by mouth daily., Disp: 90 tablet, Rfl: 2

## 2016-12-29 NOTE — Patient Instructions (Addendum)
Follow the gastroesophageal reflux lifestyle modification sheet we gave you Take famotidine over-the-counter twice a day We will arrange for a lung function test in 6 months Stop taking lisinopril Start taking losartan I will see you back in 6 months or sooner if needed

## 2016-12-29 NOTE — Assessment & Plan Note (Signed)
As above.

## 2016-12-29 NOTE — Assessment & Plan Note (Signed)
She has a nighttime dry cough with I think is likely related to acid reflux. However, lisinopril massive be contributing. Stop lisinopril. Start losartan.

## 2017-01-05 ENCOUNTER — Encounter: Payer: Medicare Other | Admitting: Registered Nurse

## 2017-01-15 ENCOUNTER — Encounter: Payer: Self-pay | Admitting: Internal Medicine

## 2017-01-15 ENCOUNTER — Ambulatory Visit (INDEPENDENT_AMBULATORY_CARE_PROVIDER_SITE_OTHER): Payer: Medicare Other | Admitting: Internal Medicine

## 2017-01-15 VITALS — BP 120/68 | HR 65 | Ht 62.0 in | Wt 183.0 lb

## 2017-01-15 DIAGNOSIS — I2510 Atherosclerotic heart disease of native coronary artery without angina pectoris: Secondary | ICD-10-CM | POA: Diagnosis not present

## 2017-01-15 DIAGNOSIS — I272 Pulmonary hypertension, unspecified: Secondary | ICD-10-CM

## 2017-01-15 DIAGNOSIS — R0609 Other forms of dyspnea: Secondary | ICD-10-CM | POA: Diagnosis not present

## 2017-01-15 NOTE — Progress Notes (Signed)
OFFICE NOTE  Chief Complaint:  Hospital follow-up  Primary Care Physician: Woody Seller, MD  HPI:  Kristin Shaffer is an 83 year old Caucasian female with a history of hyperlipidemia and hypothyroidism. She was seen by myself on March 20, 2013, and at that time was found to be in hypertensive urgency with a blood pressure of 230/94. She was admitted to [facility] for blood pressure control and cardiac catheterization for unstable angina. She did in fact have a non-ST-elevation myocardial infarction with a mildly elevated troponin of 0.67. She underwent coronary angiography, which revealed a subtotal occlusion of the mid right coronary artery. She then underwent a successful complex PCI of the entire mid RCA encompassing a long tubular 40% to 60% lesion that preceded 2 tandem 95% and 99% subtotal occlusions. This was completed using 3 overlapping Xience Xpedition drug-eluting stents. She was placed on aspirin and Brilinta, and was discharged. She subsequently returned to [facility] on April 26 to the emergency room with chest pain. Patient states that it was a "sharp, stabbing, and burning" pain. It was 10 out of 10 in intensity. She took 4 baby aspirin at home, but did not have any nitroglycerin. EMS gave her one, which provided her some relief, and midway to the ER, they gave her another one, which essentially resolved the pain. Patient reports feeling weak, but that is improving compared to when she was discharged. She has had no chest pain since the episode on the 26th. She was provided with a prescription at the ER for sublingual nitroglycerin. She did undergo an echocardiogram which showed preserved LV systolic function and an EF of 60-65%. There was stage I diastolic dysfunction and aortic sclerosis with mild central aortic regurgitation. Otherwise no significant abnormalities.  She also had carotid and renal Dopplers. The carotid Dopplers indicated a small amount of  bilateral plaque. The renal Dopplers did not indicate any renal artery stenosis.  Recently she had lipid profile performed which showed a particle number of 1783, with LDL C. of 143.  Based on this I recommended increasing her Lipitor from 20-40 mg.  She did make this change, but as noted pain and weakness in her joints, especially when she wakes up in the morning which takes about 30 minutes to improve. This sounds a lot like arthritis, but she really feels it is related to her cholesterol medicine.  Overall, at her last office visit I felt she was doing fairly well. Unfortunately about 5 days after that visit she started to have acute onset chest discomfort with radiation of pain into her left arm. She presented to the emergency department and was found to have unstable angina. Cardiac catheterization was performed which demonstrated 99% in-stent stenosis at the previously placed mid RCA stent.  I am interested as to why she presented with acute unstable angina, and not progressive anginal symptoms with exertion. She certainly had absolutely no chest pain or arm pain just a few days prior to the presentation, which is more consistent with an acute coronary syndrome.  Kristin Shaffer returns today for follow-up. She reports that she has had to take nitroglycerin twice for chest discomfort. Wants while riding in a car and another time at rest. Both episodes sound more like reflux however she did have improvement after about 15 minutes. She recently is been having problems with bruising on aspirin and Effient and is concerned about being on strong blood thinners. Her last stent placement was in 11/2013. Dual antiplatelet  therapy was recommended for at least a year.  I saw Kristin Shaffer back in the office today. She is complaining of some weakness and fatigue. She also soreness in her muscles, particularly when she gets up in the morning. Some of it seems to be joint soreness which is concerning for osteoarthritis. She  clearly has signs of osteoarthritis in her DIP joints and there is some crepitus in her knees. It is however possible that some of her symptoms could be related to Lipitor. She is on combination Lipitor and fenofibrate, which is slightly higher risk of developing myalgias. Unfortunately, her cholesterol has been really well controlled.   Kristin Shaffer returns today for follow-up. She recently was planting some irises in her lawn and is complaining of some back pain and spasm. In the past she had some relief from Flexeril, however does not currently have any. She denies any cardiac chest pain. She does get some occasional reflux symptoms associated with certain foods.  Kristin Shaffer see him back in the office again today. She has some intermittent chest discomfort. The symptoms seem atypical and are not like the chest and left arm pain she had in the past. She recently saw her primary care provider who noted some possible fluid in the right lung base. I do hear some faint crackles today however it sounds to me more like atelectasis or even fibrosis. She reports some shortness of breath. She also thinks she may be having reflux symptoms but is hesitant to take omeprazole because she says that she feels that could cause her chest pain. I reassured her that it would not.  04/14/2016  Kristin Shaffer was seen back today in follow-up for review of her echocardiogram. This is essentially unchanged compared to her prior study in 2014. Her pulmonary pressure is elevated at 45 mmHg. LVEF is 60-65%. There is mild diastolic dysfunction. Although her primary pressure is elevated, this is not likely a significant cause of her shortness of breath given the mild elevation. I am concerned however that she may have underlying pulmonary fibrosis. There is evidence of this by chest x-ray in 2015 and she has some dry crackles on exam. I do not believe there is interstitial edema. She is also complaining now of right flank pain. She has some  CVA tenderness but has had no fever, chills or shaking chills. She recently had 2 UTIs apparently which eventually improved with Cipro. She denies any chest pain.  05/22/2016  Kristin Shaffer returns today for follow-up. She underwent CT scan of the chest due to some interstitial changes on her chest x-ray and right flank/lower right posterior chest pain. She reports that this chest pain has improved and resolved although she still notes some shortness of breath with exertion. The CT scan shows a pattern of rather widespread groundglass attenuation most evident in the mid to lower lung fields. There are patchy areas of mild subpleural reticulation noted as well. This is concerning for either non-specific interstitial pneumonia or possibly IPF. Based on these findings, I would recommend a pulmonary evaluation. There is no evidence of a pneumonia. Her BNP is low at 40 and I did not suspect this is congestive heart failure or pulmonary edema. Finally she underwent abdominal ultrasound which demonstrated moderate aortoiliac atherosclerosis which will need follow-up annually.  01/15/2018  Kristin Shaffer returns today for follow-up. She's recently had a number of medical problems include a hospitalization for intracerebral hemorrhage. This was a result of trauma falling down stairs. Fortunately this resolved  completely. She did see Dr. Curt Jews for possible nonspecific interstitial pneumonia but he feels that her radiographic findings an abnormal PFTs may be attributable to chronic aspiration with GERD. She does report shortness of breath with moderate exertion. Blood pressure today was initially elevated 188/71 however recheck was 120/68. She was switched from lisinopril to losartan for persistent cough however is noted no change. She is on over-the-counter H2 blocker for her reflux.  PMHx:  Past Medical History:  Diagnosis Date  . Anxiety   . Arthritis    "different places; not bad" (03/20/2013)  . Coronary artery  disease   . Exertional shortness of breath   . GERD (gastroesophageal reflux disease)   . High cholesterol   . Hypertension   . Hypothyroidism   . Myocardial infarction    "Dr's saw evidence I might have had a heart attack" (03/20/2013)  . NSTEMI (non-ST elevated myocardial infarction) ([place]) 03/20/2013   cath - mid RCA  . Pneumonia 2012  . Skin cancer    "both legs; right arm" (03/20/2013)    Past Surgical History:  Procedure Laterality Date  . APPENDECTOMY  2003  . BREAST BIOPSY Bilateral 1990s   "total of 5; 2 on one side, 3 on the other; all benign" (03/20/2013)  . CATARACT EXTRACTION W/ INTRAOCULAR LENS  IMPLANT, BILATERAL Bilateral ~ 2008  . CORONARY ANGIOPLASTY  12/08/2013  . CORONARY ANGIOPLASTY WITH STENT PLACEMENT  03/20/2013   NSTEMI - subtotal occlusion of mid RCA - PCI of mid RCA of long tubular 40-60% lesion - 2 tandem 95-99% subtotal occlusions - 3 overlapping Xience Xpedition DES (Dr. Roni Bread)   . [place] OF UTERUS  1956?  Marland Kitchen LEFT HEART CATHETERIZATION WITH CORONARY ANGIOGRAM N/A 03/20/2013   Procedure: LEFT HEART CATHETERIZATION WITH CORONARY ANGIOGRAM;  Surgeon: Sanda Klein, MD;  Location: [facility] CATH LAB;  Service: Cardiovascular;  Laterality: N/A;  . LEFT HEART CATHETERIZATION WITH CORONARY ANGIOGRAM N/A 12/08/2013   Procedure: LEFT HEART CATHETERIZATION WITH CORONARY ANGIOGRAM;  Surgeon: Blane Ohara, MD;  Location: [facility] CATH LAB;  Service: Cardiovascular;  Laterality: N/A;  . SKIN CANCER EXCISION     "1 off right arm; 2 off each leg" (03/20/2013)  . TRANSTHORACIC ECHOCARDIOGRAM  03/2013   EF 123456, grade 1 diastolic dysfunction; mild MR; calcifed MV annulus; LA mildly dilated    FAMHx:  Family History  Problem Relation Age of Onset  . Dementia Mother   . Lung cancer Father   . CAD Father 19  . Cancer Brother     SOCHx:   reports that she has never smoked. She has never used smokeless tobacco. She reports that she does not drink alcohol or use  drugs.  ALLERGIES:  Allergies  Allergen Reactions  . Atorvastatin Other (See Comments)    Leg weakness and cramps  . Hydrocodone-Acetaminophen Nausea And Vomiting  . Omeprazole Other (See Comments)    Made acid reflux worse  . Zocor [Simvastatin] Other (See Comments)    Trouble Walking, Leg weakness and cramping  . Latex Hives and Rash    ROS: Pertinent items noted in HPI and remainder of comprehensive ROS otherwise negative.  HOME MEDS: Current Outpatient Prescriptions  Medication Sig Dispense Refill  . aspirin EC 81 MG tablet Take 81 mg by mouth daily.    Marland Kitchen atorvastatin (LIPITOR) 20 MG tablet Take 1 tablet (20 mg total) by mouth daily. 90 tablet 3  . Ca Carbonate-Mag Hydroxide (ROLAIDS) 550-110 MG CHEW Chew 1 tablet by mouth  daily as needed (heartburn).     . CALCIUM-VITAMIN D PO Take 1 tablet by mouth daily.    . clopidogrel (PLAVIX) 75 MG tablet Take 1 tablet (75 mg total) by mouth daily. 30 tablet 0  . ezetimibe (ZETIA) 10 MG tablet Take 0.5 tablets (5 mg total) by mouth daily. 30 tablet 1  . levothyroxine (SYNTHROID, LEVOTHROID) 100 MCG tablet Take 100 mcg by mouth daily before breakfast.    . losartan (COZAAR) 50 MG tablet Take 1 tablet (50 mg total) by mouth daily. 90 tablet 2  . Magnesium Oxide 250 MG TABS Take 1 tablet by mouth daily.    . metoprolol tartrate (LOPRESSOR) 25 MG tablet TAKE 1/2 TABLET TWICE DAILY 90 tablet 2  . nitroGLYCERIN (NITROSTAT) 0.4 MG SL tablet Place 1 tablet (0.4 mg total) under the tongue every 5 (five) minutes as needed for chest pain. 30 tablet 2   No current facility-administered medications for this visit.     LABS/IMAGING: No results found for this or any previous visit (from the past 48 hour(s)). No results found.  VITALS: BP 120/68   Pulse 65   Ht 5\' 2"  (1.575 m)   Wt 183 lb (83 kg)   BMI 33.47 kg/m   EXAM: General appearance: alert and no distress Neck: no carotid bruit and no JVD Lungs: clear to auscultation  bilaterally Heart: regular rate and rhythm, S1, S2 normal, no murmur, click, rub or gallop Abdomen: soft, non-tender; bowel sounds normal; no masses,  no organomegaly Extremities: extremities normal, atraumatic, no cyanosis or edema Pulses: 2+ and symmetric Skin: Skin color, texture, turgor normal. No rashes or lesions Neurologic: Grossly normal  EKG: Normal sinus rhythm at 65  ASSESSMENT: 1. Coronary artery disease status post recent and STEMI, PCI to the RCA with 4 overlapping Xience drug-eluting stents - now with recent UA and ISR 2. Hypertension 3. Hypothyroidism 4. GERD 5. Dyslipidemia - now on lipitor, zetia and fenofibrate 6. Low back spasm 7. Progressive DOE - possible pulmonary fibrosis on CT 8. Pulmonary hypertension  9. Right flank pain 10. Moderate aorto-iliac atherosclerosis 11. Recent traumatic ICH  PLAN: 1.   Kristin Shaffer recently had a traumatic ICH, but has recovered. Her dyspnea and cough persists, despite treatment for GERD and changing her ACE-I to ARB. Blood pressure was intially elevated today, but improved markedly on recheck. No medicine changes today, f/u in 6 months.  Pixie Casino, MD, [facility] Attending Cardiologist Eldon C Lamar Meter 01/15/2017, 5:10 PM

## 2017-01-15 NOTE — Patient Instructions (Signed)
Your physician wants you to follow-up in: 6 MONTHS WITH DR HILTY You will receive a reminder letter in the mail two months in advance. If you don't receive a letter, please call our office to schedule the follow-up appointment.   If you need a refill on your cardiac medications before your next appointment, please call your pharmacy.  

## 2017-02-02 ENCOUNTER — Other Ambulatory Visit: Payer: Self-pay | Admitting: Internal Medicine

## 2017-04-25 ENCOUNTER — Other Ambulatory Visit: Payer: Self-pay | Admitting: Internal Medicine

## 2017-04-27 ENCOUNTER — Ambulatory Visit (HOSPITAL_COMMUNITY)
Admission: RE | Admit: 2017-04-27 | Discharge: 2017-04-27 | Disposition: A | Discharge status: Home / Self Care | Payer: Medicare Other | Source: Ambulatory Visit | Attending: Cardiology | Admitting: Cardiology

## 2017-04-27 ENCOUNTER — Other Ambulatory Visit: Payer: Self-pay | Admitting: *Deleted

## 2017-04-27 DIAGNOSIS — I779 Disorder of arteries and arterioles, unspecified: Secondary | ICD-10-CM | POA: Diagnosis present

## 2017-04-27 DIAGNOSIS — I700 Atherosclerosis of aorta: Secondary | ICD-10-CM | POA: Diagnosis not present

## 2017-04-27 DIAGNOSIS — I2510 Atherosclerotic heart disease of native coronary artery without angina pectoris: Secondary | ICD-10-CM | POA: Insufficient documentation

## 2017-04-27 DIAGNOSIS — I723 Aneurysm of iliac artery: Secondary | ICD-10-CM | POA: Insufficient documentation

## 2017-04-27 DIAGNOSIS — E785 Hyperlipidemia, unspecified: Secondary | ICD-10-CM | POA: Diagnosis not present

## 2017-04-27 DIAGNOSIS — I739 Peripheral vascular disease, unspecified: Secondary | ICD-10-CM | POA: Diagnosis not present

## 2017-04-27 DIAGNOSIS — I10 Essential (primary) hypertension: Secondary | ICD-10-CM | POA: Insufficient documentation

## 2017-04-27 DIAGNOSIS — I7409 Other arterial embolism and thrombosis of abdominal aorta: Secondary | ICD-10-CM

## 2017-04-27 MED ORDER — NITROGLYCERIN 0.4 MG SL SUBL: 0.4000 mg | SUBLINGUAL_TABLET | SUBLINGUAL | 6 refills | Status: DC | PRN

## 2017-05-03 ENCOUNTER — Other Ambulatory Visit: Payer: Self-pay | Admitting: *Deleted

## 2017-05-03 DIAGNOSIS — I7409 Other arterial embolism and thrombosis of abdominal aorta: Secondary | ICD-10-CM

## 2017-05-03 DIAGNOSIS — I779 Disorder of arteries and arterioles, unspecified: Secondary | ICD-10-CM

## 2017-06-29 ENCOUNTER — Ambulatory Visit (INDEPENDENT_AMBULATORY_CARE_PROVIDER_SITE_OTHER): Payer: Medicare Other | Admitting: Pulmonary Disease

## 2017-06-29 ENCOUNTER — Encounter: Payer: Self-pay | Admitting: Pulmonary Disease

## 2017-06-29 VITALS — BP 124/62 | HR 68 | Ht 64.5 in | Wt 189.0 lb

## 2017-06-29 DIAGNOSIS — I2510 Atherosclerotic heart disease of native coronary artery without angina pectoris: Secondary | ICD-10-CM | POA: Diagnosis not present

## 2017-06-29 DIAGNOSIS — J8410 Pulmonary fibrosis, unspecified: Secondary | ICD-10-CM

## 2017-06-29 LAB — PULMONARY FUNCTION TEST
DL/VA % pred: 85 %
DL/VA: 4.16 ml/min/mmHg/L
DLCO COR % PRED: 65 %
DLCO cor: 16.29 ml/min/mmHg
DLCO unc % pred: 67 %
DLCO unc: 16.72 ml/min/mmHg
FEF 25-75 Post: 2.96 L/sec
FEF 25-75 Pre: 3.54 L/sec
FEF2575-%Change-Post: -16 %
FEF2575-%PRED-PRE: 295 %
FEF2575-%Pred-Post: 246 %
FEV1-%CHANGE-POST: -3 %
FEV1-%Pred-Post: 107 %
FEV1-%Pred-Pre: 111 %
FEV1-POST: 1.97 L
FEV1-PRE: 2.04 L
FEV1FVC-%CHANGE-POST: 0 %
FEV1FVC-%Pred-Pre: 127 %
FEV6-%CHANGE-POST: -4 %
FEV6-%PRED-PRE: 94 %
FEV6-%Pred-Post: 90 %
FEV6-PRE: 2.2 L
FEV6-Post: 2.1 L
FEV6FVC-%PRED-PRE: 106 %
FEV6FVC-%Pred-Post: 106 %
FVC-%CHANGE-POST: -4 %
FVC-%PRED-PRE: 89 %
FVC-%Pred-Post: 85 %
FVC-PRE: 2.2 L
FVC-Post: 2.1 L
POST FEV6/FVC RATIO: 100 %
Post FEV1/FVC ratio: 94 %
Pre FEV1/FVC ratio: 93 %
Pre FEV6/FVC Ratio: 100 %
RV % pred: 77 %
RV: 1.95 L
TLC % pred: 84 %
TLC: 4.32 L

## 2017-06-29 NOTE — Progress Notes (Signed)
PFT done today. 

## 2017-06-29 NOTE — Patient Instructions (Signed)
For your pulmonary fibrosis: We will get another lung function test in one year Let us know if you have any increasing shortness of breath, cough or other respiratory problems Get a flu shot in the fall  We will see you back in one year or sooner if needed

## 2017-06-29 NOTE — Progress Notes (Signed)
Subjective:    Patient ID: Kristin Shaffer, female    DOB: 09/11/1931, 83 y.o.   MRN: [id number]  Synopsis: Referred in 2017 for evaluation of non-specific ILD.  She has acid reflux.    HPI Chief Complaint  Patient presents with  . Follow-up    review pft.  pt c/o baseline sob with exertion.       Kristin Shaffer says she has been about the same.  She doesn't recall any new respiratory symptoms.  She is still carrying out the groceries regularly.  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  She is not exposed to thsi anymore.  She says that when she carries out the garbage can 100 yards she gets dyspnea.  She says she still says very active.     Past Medical History:  Diagnosis Date  . Anxiety   . Arthritis    "different places; not bad" (03/20/2013)  . Coronary artery disease   . Exertional shortness of breath   . GERD (gastroesophageal reflux disease)   . High cholesterol   . Hypertension   . Hypothyroidism   . Myocardial infarction [facility])    "Dr's saw evidence I might have had a heart attack" (03/20/2013)  . NSTEMI (non-ST elevated myocardial infarction) ([place]) 03/20/2013   cath - mid RCA  . Pneumonia 2012  . Skin cancer    "both legs; right arm" (03/20/2013)      Review of Systems  Constitutional: Negative for chills, fatigue and fever.  HENT: Negative for rhinorrhea, sinus pain and sinus pressure.   Respiratory: Positive for cough. Negative for choking and shortness of breath.   Cardiovascular: Negative for chest pain, palpitations and leg swelling.       Objective:   Physical Exam Vitals:   06/29/17 1132  BP: 124/62  Pulse: 68  SpO2: 95%  Weight: 189 lb (85.7 kg)  Height: 5' 4.5" (1.638 m)  RA  Gen: well appearing HENT: OP clear, TM's clear, neck supple PULM: Crackles bases B, normal percussion CV: RRR, no mgr, trace edema GI: BS+, soft, nontender Derm: no cyanosis or rash Psyche: normal mood and affect   2017 CT chest with fine  groundglass throughout, mild bronchiolectasis, some interlobular septal thickening in the periphery  Other 05/2016 ANA, CCP, RA, SCL 70, Jo-1, SSA, SSB, RF, and aldolase Centromere all negative 05/2016 PFT> Ratio normal, FVC 2.47L (98% pred), TLC 4.94 (96% pred), DLCO 18.77 (75% pred) January 2018 high-resolution CT scan of the chest with some mild interstitial changes in the bases, favored to represent nonspecific interstitial pneumonia, 3 vessel CAD  October 2017 hospital discharge summary reviewed: She had traumatic brain injury with subdural hematoma and subarachnoid hemorrhage as well as multiple facial fractures.  PFT February 2018 pulmonary function testing ratio 94%, FVC 2.13 L 85% predicted, total lung capacity 4.38 L 85% predicted, DLCO 17.28 69% predicted August 2018 ratio 94%, FVC 2.20 L 89% predicted, total lung capacity 4.32 L 84% predicted, DLCO 16.72 67% predicted     Assessment & Plan:  Pulmonary fibrosis ([place]) - Plan: Pulmonary function test  Discussion: Kristin Shaffer has a nonspecific form of pulmonary fibrosis which on imaging is not consistent with idiopathic pulmonary fibrosis. Fortunately, we've seen no interval worsening in lung function testing which is reassuring that we are not dealing with idiopathic pulmonary fibrosis. I believe that her pulmonary fibrosis is either due to pesticide exposure as a child or due to recurrent silent  aspiration. Regardless, it's not progressing which is good. We'll see her back in a year with another lung function test at that point. I told her that if she has an increase in symptoms she needs to come back and see me sooner than that.  Plan: For your pulmonary fibrosis: We will get another lung function test in one year Let us know if you have any increasing shortness of breath, cough or other respiratory problems Get a flu shot in the fall  We will see you back in one year or sooner if needed  > 50% of this 26 minute visit spent face to  face    Current Outpatient Prescriptions:  .  aspirin EC 81 MG tablet, Take 81 mg by mouth daily., Disp: , Rfl:  .  atorvastatin (LIPITOR) 20 MG tablet, Take 1 tablet (20 mg total) by mouth daily., Disp: 90 tablet, Rfl: 3 .  Ca Carbonate-Mag Hydroxide (ROLAIDS) 550-110 MG CHEW, Chew 1 tablet by mouth daily as needed (heartburn). , Disp: , Rfl:  .  CALCIUM-VITAMIN D PO, Take 1 tablet by mouth daily., Disp: , Rfl:  .  clopidogrel (PLAVIX) 75 MG tablet, TAKE 1 TABLET EVERY DAY, Disp: 90 tablet, Rfl: 2 .  ezetimibe (ZETIA) 10 MG tablet, TAKE 1/2 TABLET EVERY DAY, Disp: 45 tablet, Rfl: 1 .  levothyroxine (SYNTHROID, LEVOTHROID) 100 MCG tablet, Take 100 mcg by mouth daily before breakfast., Disp: , Rfl:  .  losartan (COZAAR) 50 MG tablet, Take 1 tablet (50 mg total) by mouth daily., Disp: 90 tablet, Rfl: 2 .  Magnesium Oxide 250 MG TABS, Take 1 tablet by mouth daily., Disp: , Rfl:  .  metoprolol tartrate (LOPRESSOR) 25 MG tablet, TAKE 1/2 TABLET TWICE DAILY, Disp: 90 tablet, Rfl: 2 .  nitroGLYCERIN (NITROSTAT) 0.4 MG SL tablet, Place 1 tablet (0.4 mg total) under the tongue every 5 (five) minutes as needed for chest pain., Disp: 25 tablet, Rfl: 6

## 2017-07-20 ENCOUNTER — Ambulatory Visit: Payer: Medicare Other | Admitting: Internal Medicine

## 2017-08-01 ENCOUNTER — Other Ambulatory Visit: Payer: Self-pay | Admitting: Internal Medicine

## 2017-08-16 ENCOUNTER — Other Ambulatory Visit: Payer: Self-pay | Admitting: Internal Medicine

## 2017-08-16 ENCOUNTER — Other Ambulatory Visit: Payer: Self-pay | Admitting: Pulmonary Disease

## 2017-09-10 ENCOUNTER — Other Ambulatory Visit: Payer: Self-pay | Admitting: Internal Medicine

## 2017-09-28 ENCOUNTER — Encounter: Payer: Self-pay | Admitting: Internal Medicine

## 2017-09-28 ENCOUNTER — Ambulatory Visit: Payer: Medicare Other | Admitting: Internal Medicine

## 2017-09-28 ENCOUNTER — Ambulatory Visit (INDEPENDENT_AMBULATORY_CARE_PROVIDER_SITE_OTHER): Payer: Medicare Other | Admitting: Internal Medicine

## 2017-09-28 VITALS — BP 140/78 | HR 55 | Ht 64.5 in | Wt 186.0 lb

## 2017-09-28 DIAGNOSIS — I2510 Atherosclerotic heart disease of native coronary artery without angina pectoris: Secondary | ICD-10-CM | POA: Diagnosis not present

## 2017-09-28 DIAGNOSIS — Z79899 Other long term (current) drug therapy: Secondary | ICD-10-CM | POA: Diagnosis not present

## 2017-09-28 DIAGNOSIS — E782 Mixed hyperlipidemia: Secondary | ICD-10-CM | POA: Diagnosis not present

## 2017-09-28 DIAGNOSIS — J8410 Pulmonary fibrosis, unspecified: Secondary | ICD-10-CM | POA: Diagnosis not present

## 2017-09-28 LAB — COMPREHENSIVE METABOLIC PANEL
ALBUMIN: 4.6 g/dL (ref 3.5–4.7)
ALT: 19 IU/L (ref 0–32)
AST: 44 IU/L — ABNORMAL HIGH (ref 0–40)
Albumin/Globulin Ratio: 2.2 (ref 1.2–2.2)
Alkaline Phosphatase: 139 IU/L — ABNORMAL HIGH (ref 39–117)
BILIRUBIN TOTAL: 0.7 mg/dL (ref 0.0–1.2)
BUN / CREAT RATIO: 17 (ref 12–28)
BUN: 14 mg/dL (ref 8–27)
CALCIUM: 9.5 mg/dL (ref 8.7–10.3)
CO2: 24 mmol/L (ref 20–29)
Chloride: 103 mmol/L (ref 96–106)
Creatinine, Ser: 0.83 mg/dL (ref 0.57–1.00)
GFR, EST AFRICAN AMERICAN: 75 mL/min/{1.73_m2} (ref >59)
GFR, EST NON AFRICAN AMERICAN: 65 mL/min/{1.73_m2} (ref >59)
GLUCOSE: 99 mg/dL (ref 65–99)
Globulin, Total: 2.1 g/dL (ref 1.5–4.5)
Potassium: 4.3 mmol/L (ref 3.5–5.2)
Sodium: 142 mmol/L (ref 134–144)
TOTAL PROTEIN: 6.7 g/dL (ref 6.0–8.5)

## 2017-09-28 LAB — LIPID PANEL
CHOL/HDL RATIO: 2.5 ratio (ref 0.0–4.4)
Cholesterol, Total: 169 mg/dL (ref 100–199)
HDL: 67 mg/dL (ref >39)
LDL Calculated: 82 mg/dL (ref 0–99)
TRIGLYCERIDES: 100 mg/dL (ref 0–149)
VLDL Cholesterol Cal: 20 mg/dL (ref 5–40)

## 2017-09-28 NOTE — Progress Notes (Signed)
OFFICE NOTE  Chief Complaint:  Routine follow-up  Primary Care Physician: Christain Sacramento, MD  HPI:  Kristin Shaffer is an 83 year old Caucasian female with a history of hyperlipidemia and hypothyroidism. She was seen by myself on March 20, 2013, and at that time was found to be in hypertensive urgency with a blood pressure of 230/94. She was admitted to [facility] for blood pressure control and cardiac catheterization for unstable angina. She did in fact have a non-ST-elevation myocardial infarction with a mildly elevated troponin of 0.67. She underwent coronary angiography, which revealed a subtotal occlusion of the mid right coronary artery. She then underwent a successful complex PCI of the entire mid RCA encompassing a long tubular 40% to 60% lesion that preceded 2 tandem 95% and 99% subtotal occlusions. This was completed using 3 overlapping Xience Xpedition drug-eluting stents. She was placed on aspirin and Brilinta, and was discharged. She subsequently returned to [facility] on April 26 to the emergency room with chest pain. Patient states that it was a "sharp, stabbing, and burning" pain. It was 10 out of 10 in intensity. She took 4 baby aspirin at home, but did not have any nitroglycerin. EMS gave her one, which provided her some relief, and midway to the ER, they gave her another one, which essentially resolved the pain. Patient reports feeling weak, but that is improving compared to when she was discharged. She has had no chest pain since the episode on the 26th. She was provided with a prescription at the ER for sublingual nitroglycerin. She did undergo an echocardiogram which showed preserved LV systolic function and an EF of 60-65%. There was stage I diastolic dysfunction and aortic sclerosis with mild central aortic regurgitation. Otherwise no significant abnormalities.  She also had carotid and renal Dopplers. The carotid Dopplers indicated a small amount of bilateral  plaque. The renal Dopplers did not indicate any renal artery stenosis.  Recently she had lipid profile performed which showed a particle number of 1783, with LDL C. of 143.  Based on this I recommended increasing her Lipitor from 20-40 mg.  She did make this change, but as noted pain and weakness in her joints, especially when she wakes up in the morning which takes about 30 minutes to improve. This sounds a lot like arthritis, but she really feels it is related to her cholesterol medicine.  Overall, at her last office visit I felt she was doing fairly well. Unfortunately about 5 days after that visit she started to have acute onset chest discomfort with radiation of pain into her left arm. She presented to the emergency department and was found to have unstable angina. Cardiac catheterization was performed which demonstrated 99% in-stent stenosis at the previously placed mid RCA stent.  I am interested as to why she presented with acute unstable angina, and not progressive anginal symptoms with exertion. She certainly had absolutely no chest pain or arm pain just a few days prior to the presentation, which is more consistent with an acute coronary syndrome.  Kristin Shaffer returns today for follow-up. She reports that she has had to take nitroglycerin twice for chest discomfort. Wants while riding in a car and another time at rest. Both episodes sound more like reflux however she did have improvement after about 15 minutes. She recently is been having problems with bruising on aspirin and Effient and is concerned about being on strong blood thinners. Her last stent placement was in 11/2013. Dual antiplatelet therapy  was recommended for at least a year.  I saw Kristin Shaffer back in the office today. She is complaining of some weakness and fatigue. She also soreness in her muscles, particularly when she gets up in the morning. Some of it seems to be joint soreness which is concerning for osteoarthritis. She clearly  has signs of osteoarthritis in her DIP joints and there is some crepitus in her knees. It is however possible that some of her symptoms could be related to Lipitor. She is on combination Lipitor and fenofibrate, which is slightly higher risk of developing myalgias. Unfortunately, her cholesterol has been really well controlled.   Kristin Shaffer returns today for follow-up. She recently was planting some irises in her lawn and is complaining of some back pain and spasm. In the past she had some relief from Flexeril, however does not currently have any. She denies any cardiac chest pain. She does get some occasional reflux symptoms associated with certain foods.  Kristin Shaffer see him back in the office again today. She has some intermittent chest discomfort. The symptoms seem atypical and are not like the chest and left arm pain she had in the past. She recently saw her primary care provider who noted some possible fluid in the right lung base. I do hear some faint crackles today however it sounds to me more like atelectasis or even fibrosis. She reports some shortness of breath. She also thinks she may be having reflux symptoms but is hesitant to take omeprazole because she says that she feels that could cause her chest pain. I reassured her that it would not.  04/14/2016  Kristin Shaffer was seen back today in follow-up for review of her echocardiogram. This is essentially unchanged compared to her prior study in 2014. Her pulmonary pressure is elevated at 45 mmHg. LVEF is 60-65%. There is mild diastolic dysfunction. Although her primary pressure is elevated, this is not likely a significant cause of her shortness of breath given the mild elevation. I am concerned however that she may have underlying pulmonary fibrosis. There is evidence of this by chest x-ray in 2015 and she has some dry crackles on exam. I do not believe there is interstitial edema. She is also complaining now of right flank pain. She has some CVA  tenderness but has had no fever, chills or shaking chills. She recently had 2 UTIs apparently which eventually improved with Cipro. She denies any chest pain.  05/22/2016  Mrs. Forry returns today for follow-up. She underwent CT scan of the chest due to some interstitial changes on her chest x-ray and right flank/lower right posterior chest pain. She reports that this chest pain has improved and resolved although she still notes some shortness of breath with exertion. The CT scan shows a pattern of rather widespread groundglass attenuation most evident in the mid to lower lung fields. There are patchy areas of mild subpleural reticulation noted as well. This is concerning for either non-specific interstitial pneumonia or possibly IPF. Based on these findings, I would recommend a pulmonary evaluation. There is no evidence of a pneumonia. Her BNP is low at 40 and I did not suspect this is congestive heart failure or pulmonary edema. Finally she underwent abdominal ultrasound which demonstrated moderate aortoiliac atherosclerosis which will need follow-up annually.  01/15/2018  Mrs. Lutzke returns today for follow-up. She's recently had a number of medical problems include a hospitalization for intracerebral hemorrhage. This was a result of trauma falling down stairs. Fortunately this resolved completely.  She did see Dr. Curt Jews for possible nonspecific interstitial pneumonia but he feels that her radiographic findings an abnormal PFTs may be attributable to chronic aspiration with GERD. She does report shortness of breath with moderate exertion. Blood pressure today was initially elevated 188/71 however recheck was 120/68. She was switched from lisinopril to losartan for persistent cough however is noted no change. She is on over-the-counter H2 blocker for her reflux.  09/28/2017  Quillian Quince was seen today in follow-up.  Overall she seems to be doing well.  She saw Dr. Lake Bells for pulmonary fibrosis.  He is  thought that this may be related to prior pesticide exposure or silent aspiration.  He does not feel like he has changed significantly and may not be IPF.  She denies any recurrent chest pain.  She is recovered from her intracerebral hemorrhage.  She is now back on aspirin and Plavix.  She has not had recent lipid testing.  She is n.p.o. today.  Blood pressure is top normal 140/78.  PMHx:  Past Medical History:  Diagnosis Date  . Anxiety   . Arthritis    "different places; not bad" (03/20/2013)  . Coronary artery disease   . Exertional shortness of breath   . GERD (gastroesophageal reflux disease)   . High cholesterol   . Hypertension   . Hypothyroidism   . Myocardial infarction [facility])    "Dr's saw evidence I might have had a heart attack" (03/20/2013)  . NSTEMI (non-ST elevated myocardial infarction) ([place]) 03/20/2013   cath - mid RCA  . Pneumonia 2012  . Skin cancer    "both legs; right arm" (03/20/2013)    Past Surgical History:  Procedure Laterality Date  . APPENDECTOMY  2003  . BREAST BIOPSY Bilateral 1990s   "total of 5; 2 on one side, 3 on the other; all benign" (03/20/2013)  . CATARACT EXTRACTION W/ INTRAOCULAR LENS  IMPLANT, BILATERAL Bilateral ~ 2008  . CORONARY ANGIOPLASTY  12/08/2013  . CORONARY ANGIOPLASTY WITH STENT PLACEMENT  03/20/2013   NSTEMI - subtotal occlusion of mid RCA - PCI of mid RCA of long tubular 40-60% lesion - 2 tandem 95-99% subtotal occlusions - 3 overlapping Xience Xpedition DES (Dr. Roni Bread)   . [place] OF UTERUS  1956?  Marland Kitchen LEFT HEART CATHETERIZATION WITH CORONARY ANGIOGRAM N/A 03/20/2013   Procedure: LEFT HEART CATHETERIZATION WITH CORONARY ANGIOGRAM;  Surgeon: Sanda Klein, MD;  Location: [facility] CATH LAB;  Service: Cardiovascular;  Laterality: N/A;  . LEFT HEART CATHETERIZATION WITH CORONARY ANGIOGRAM N/A 12/08/2013   Procedure: LEFT HEART CATHETERIZATION WITH CORONARY ANGIOGRAM;  Surgeon: Blane Ohara, MD;  Location: [facility] CATH LAB;   Service: Cardiovascular;  Laterality: N/A;  . SKIN CANCER EXCISION     "1 off right arm; 2 off each leg" (03/20/2013)  . TRANSTHORACIC ECHOCARDIOGRAM  03/2013   EF 36-62%, grade 1 diastolic dysfunction; mild MR; calcifed MV annulus; LA mildly dilated    FAMHx:  Family History  Problem Relation Age of Onset  . Dementia Mother   . Lung cancer Father   . CAD Father 13  . Cancer Brother     SOCHx:   reports that she has never smoked. She has never used smokeless tobacco. She reports that she does not drink alcohol or use drugs.  ALLERGIES:  Allergies  Allergen Reactions  . Hydrocodone-Acetaminophen Nausea And Vomiting  . Omeprazole Other (See Comments)    Made acid reflux worse  . Zocor [Simvastatin] Other (See Comments)  Trouble Walking, Leg weakness and cramping  . Latex Hives and Rash    ROS: Pertinent items noted in HPI and remainder of comprehensive ROS otherwise negative.  HOME MEDS: Current Outpatient Prescriptions  Medication Sig Dispense Refill  . aspirin EC 81 MG tablet Take 81 mg by mouth daily.    Marland Kitchen atorvastatin (LIPITOR) 20 MG tablet TAKE 1 TABLET (20 MG TOTAL) BY MOUTH DAILY. 90 tablet 3  . CALCIUM-VITAMIN D PO Take 1 tablet by mouth daily.    . clopidogrel (PLAVIX) 75 MG tablet TAKE 1 TABLET EVERY DAY 90 tablet 2  . ezetimibe (ZETIA) 10 MG tablet TAKE 1/2 TABLET EVERY DAY 45 tablet 1  . levothyroxine (SYNTHROID, LEVOTHROID) 100 MCG tablet Take 100 mcg by mouth daily before breakfast.    . losartan (COZAAR) 50 MG tablet TAKE 1 TABLET (50 MG TOTAL) BY MOUTH DAILY. 90 tablet 2  . Magnesium Oxide 250 MG TABS Take 1 tablet by mouth daily.    . metoprolol tartrate (LOPRESSOR) 25 MG tablet TAKE 1/2 TABLET TWICE DAILY 90 tablet 0  . nitroGLYCERIN (NITROSTAT) 0.4 MG SL tablet Place 1 tablet (0.4 mg total) under the tongue every 5 (five) minutes as needed for chest pain. 25 tablet 6   No current facility-administered medications for this visit.     LABS/IMAGING: No  results found for this or any previous visit (from the past 48 hour(s)). No results found.  VITALS: BP 140/78   Pulse (!) 55   Ht 5' 4.5" (1.638 m)   Wt 186 lb (84.4 kg)   BMI 31.43 kg/m   EXAM: General appearance: alert and no distress Neck: no carotid bruit and no JVD Lungs: clear to auscultation bilaterally Heart: regular rate and rhythm, S1, S2 normal, no murmur, click, rub or gallop Abdomen: soft, non-tender; bowel sounds normal; no masses,  no organomegaly Extremities: extremities normal, atraumatic, no cyanosis or edema Pulses: 2+ and symmetric Skin: Skin color, texture, turgor normal. No rashes or lesions Neurologic: Grossly normal  EKG: Sinus bradycardia at 55-personally reviewed  ASSESSMENT: 1. Coronary artery disease status post recent and STEMI, PCI to the RCA with 4 overlapping Xience drug-eluting stents - now with recent UA and ISR (2015) 2. Hypertension 3. Hypothyroidism 4. GERD 5. Dyslipidemia - now on lipitor, zetia and fenofibrate 6. Low back spasm 7. Progressive DOE - possible pulmonary fibrosis on CT 8. Pulmonary hypertension  9. Right flank pain 10. Moderate aorto-iliac atherosclerosis 11. Recent traumatic ICH  PLAN: 1.   Mrs. Magnussen is doing well.  She denies any recurrent anginal chest pain.  She is back on aspirin and Plavix after recent traumatic intracerebral hemorrhage.  Her thyroid managed by her PCP.  She has not had a recent lipid profile although fenofibrate was discontinued.  Will check a lipid profile and adjust her medications accordingly.  Fortunately her pulmonary fibrosis is stable and she is followed by pulmonary for this.  Follow-up annually or sooner as necessary.  Pixie Casino, MD, [facility] Attending Cardiologist [place] [facility] 09/28/2017, 11:03 AM

## 2017-09-28 NOTE — Patient Instructions (Signed)
Medication Instructions:  NO CHANGES If you need a refill on your cardiac medications before your next appointment, please call your pharmacy.  Labwork: FASTING LIPID  PANEL AND CMET HERE IN OUR OFFICE AT [facility]  Follow-Up: Your physician wants you to follow-up in: WITH DR HILTY IN 12 MONTHS You should receive a reminder letter in the mail two months in advance. If you do not receive a letter, please call our office September 2019 to schedule the November 2019 follow-up appointment.   Thank you for choosing [facility] at [facility]!!

## 2017-11-14 ENCOUNTER — Other Ambulatory Visit: Payer: Self-pay | Admitting: Internal Medicine

## 2018-03-11 ENCOUNTER — Other Ambulatory Visit: Payer: Self-pay | Admitting: Internal Medicine

## 2018-05-01 ENCOUNTER — Encounter (HOSPITAL_COMMUNITY): Payer: Medicare Other

## 2018-05-24 ENCOUNTER — Ambulatory Visit (HOSPITAL_COMMUNITY)
Admission: RE | Admit: 2018-05-24 | Discharge: 2018-05-24 | Disposition: A | Discharge status: Home / Self Care | Payer: Medicare Other | Source: Ambulatory Visit | Attending: Cardiovascular Disease | Admitting: Cardiovascular Disease

## 2018-05-24 DIAGNOSIS — I779 Disorder of arteries and arterioles, unspecified: Secondary | ICD-10-CM

## 2018-05-24 DIAGNOSIS — I10 Essential (primary) hypertension: Secondary | ICD-10-CM | POA: Insufficient documentation

## 2018-05-24 DIAGNOSIS — I7409 Other arterial embolism and thrombosis of abdominal aorta: Secondary | ICD-10-CM | POA: Diagnosis not present

## 2018-05-24 DIAGNOSIS — E785 Hyperlipidemia, unspecified: Secondary | ICD-10-CM | POA: Insufficient documentation

## 2018-05-27 ENCOUNTER — Other Ambulatory Visit: Payer: Self-pay | Admitting: *Deleted

## 2018-05-27 DIAGNOSIS — I779 Disorder of arteries and arterioles, unspecified: Secondary | ICD-10-CM

## 2018-05-27 DIAGNOSIS — I7409 Other arterial embolism and thrombosis of abdominal aorta: Secondary | ICD-10-CM

## 2018-06-24 ENCOUNTER — Other Ambulatory Visit: Payer: Self-pay | Admitting: Pulmonary Disease

## 2018-07-08 ENCOUNTER — Telehealth: Payer: Self-pay | Admitting: Pulmonary Disease

## 2018-07-08 NOTE — Telephone Encounter (Signed)
Attempted to call patient, no answer, left message to call back.  

## 2018-07-09 MED ORDER — LOSARTAN POTASSIUM 50 MG PO TABS: 50.0000 mg | ORAL_TABLET | Freq: Every day | ORAL | 0 refills | Status: DC

## 2018-07-09 NOTE — Telephone Encounter (Signed)
Patient returning call - she can be reached at [phone]

## 2018-07-09 NOTE — Telephone Encounter (Signed)
Pt requesting refill of losartan.  Pt has not been seen since 06/2017.  Scheduled for a rov in 07/2018, 1 refill sent to last until that appt.  Pt aware that she must keep this OV for any further refills.  Nothing further needed.

## 2018-07-09 NOTE — Telephone Encounter (Signed)
Called and spoke with patient regarding concerns with RX Losartan 50mg  from pharmacy The pharmacy called pt prior to me calling her this morning At this time everything has been taken care of, nothing further needed at this time.

## 2018-07-29 ENCOUNTER — Other Ambulatory Visit: Payer: Self-pay | Admitting: Internal Medicine

## 2018-08-16 ENCOUNTER — Encounter: Payer: Self-pay | Admitting: Pulmonary Disease

## 2018-08-16 ENCOUNTER — Ambulatory Visit (INDEPENDENT_AMBULATORY_CARE_PROVIDER_SITE_OTHER): Payer: Medicare Other | Admitting: Pulmonary Disease

## 2018-08-16 VITALS — BP 134/82 | HR 60 | Ht 64.5 in | Wt 192.2 lb

## 2018-08-16 DIAGNOSIS — J8410 Pulmonary fibrosis, unspecified: Secondary | ICD-10-CM | POA: Diagnosis not present

## 2018-08-16 DIAGNOSIS — J849 Interstitial pulmonary disease, unspecified: Secondary | ICD-10-CM | POA: Diagnosis not present

## 2018-08-16 NOTE — Progress Notes (Signed)
Subjective:    Patient ID: Kristin Shaffer, female    DOB: 07/26/1931, 84 y.o.   MRN: [id number]  Synopsis: Referred in 2017 for evaluation of non-specific ILD.  She has acid reflux.    HPI Chief Complaint  Patient presents with  . Follow-up    Pt states she has been doing good since last visit.  Denies any complaints of cough or SOB. States she does have occ CP but contributes that to acid reflux.   Kristin Shaffer says that this has been a good year for her.  She has not had worsening shortness of breath.  She says that when she carries her garbage out to the curb she will notice some shortness of breath from time to time but this is not very frequent.  Both she and her son feel that she has not had worsening shortness of breath symptoms.  She does not have a cough.  She says that stopping lisinopril really helped with this.   Past Medical History:  Diagnosis Date  . Anxiety   . Arthritis    "different places; not bad" (03/20/2013)  . Coronary artery disease   . Exertional shortness of breath   . GERD (gastroesophageal reflux disease)   . High cholesterol   . Hypertension   . Hypothyroidism   . Myocardial infarction [facility])    "Dr's saw evidence I might have had a heart attack" (03/20/2013)  . NSTEMI (non-ST elevated myocardial infarction) ([place]) 03/20/2013   cath - mid RCA  . Pneumonia 2012  . Skin cancer    "both legs; right arm" (03/20/2013)      Review of Systems  Constitutional: Negative for chills, fatigue and fever.  HENT: Negative for rhinorrhea, sinus pressure and sinus pain.   Respiratory: Positive for cough. Negative for choking and shortness of breath.   Cardiovascular: Negative for chest pain, palpitations and leg swelling.       Objective:   Physical Exam Vitals:   08/16/18 1137  BP: 134/82  Pulse: 60  SpO2: 96%  Weight: 192 lb 3.2 oz (87.2 kg)  Height: 5' 4.5" (1.638 m)  RA  Gen: well appearing HENT: OP clear, TM's clear, neck supple PULM: Few  crackles bases B, normal percussion CV: RRR, no mgr, trace edema GI: BS+, soft, nontender Derm: no cyanosis or rash Psyche: normal mood and affect   Impaging 2017 CT chest with fine groundglass throughout, mild bronchiolectasis, some interlobular septal thickening in the periphery  January 2018 high-resolution CT scan of the chest with some mild interstitial changes in the bases, favored to represent nonspecific interstitial pneumonia, 3 vessel CAD  Lab: 05/2016 ANA, CCP, RA, SCL 70, Jo-1, SSA, SSB, RF, and aldolase Centromere all negative   October 2017 hospital discharge summary reviewed: She had traumatic brain injury with subdural hematoma and subarachnoid hemorrhage as well as multiple facial fractures.  PFT 05/2016 PFT> Ratio normal, FVC 2.47L (98% pred), TLC 4.94 (96% pred), DLCO 18.77 (75% pred) February 2018 pulmonary function testing ratio 94%, FVC 2.13 L 85% predicted, total lung capacity 4.38 L 85% predicted, DLCO 17.28 69% predicted August 2018 ratio 94%, FVC 2.20 L 89% predicted, total lung capacity 4.32 L 84% predicted, DLCO 16.72 67% predicted     Assessment & Plan:  Pulmonary fibrosis (HCC)  ILD (interstitial lung disease) ([place])  Discussion: This has been a stable interval for [place].  We will need to reschedule her lung function test but by clinical symptoms there is no evidence that  the ill-defined interstitial lung disease has worsened.  Plan: Interstitial lung disease: As I described today I do not have a formal diagnosis for this but fortunately the symptoms do not seem to be worsening We will repeat a lung function test to make sure there is no evidence of progression Call me if you have worsening shortness of breath or cough We will check your oxygen level while walking today Get a high-dose flu shot when available Practice good hand hygiene  I will plan on seeing you back in 1 year or sooner if needed   Current Outpatient Medications:  .  aspirin  EC 81 MG tablet, Take 81 mg by mouth daily., Disp: , Rfl:  .  atorvastatin (LIPITOR) 20 MG tablet, Take 1 tablet (20 mg total) by mouth daily. KEEP OV., Disp: 90 tablet, Rfl: 0 .  CALCIUM-VITAMIN D PO, Take 1 tablet by mouth daily., Disp: , Rfl:  .  clopidogrel (PLAVIX) 75 MG tablet, TAKE 1 TABLET EVERY DAY, Disp: 90 tablet, Rfl: 3 .  ezetimibe (ZETIA) 10 MG tablet, TAKE 1/2 TABLET EVERY DAY, Disp: 45 tablet, Rfl: 3 .  levothyroxine (SYNTHROID, LEVOTHROID) 100 MCG tablet, Take 100 mcg by mouth daily before breakfast., Disp: , Rfl:  .  losartan (COZAAR) 50 MG tablet, Take 1 tablet (50 mg total) by mouth daily., Disp: 90 tablet, Rfl: 0 .  Magnesium Oxide 250 MG TABS, Take 1 tablet by mouth daily., Disp: , Rfl:  .  metoprolol tartrate (LOPRESSOR) 25 MG tablet, Take 0.5 tablets (12.5 mg total) by mouth 2 (two) times daily., Disp: 90 tablet, Rfl: 3 .  pantoprazole (PROTONIX) 20 MG tablet, Take by mouth., Disp: , Rfl:  .  nitroGLYCERIN (NITROSTAT) 0.4 MG SL tablet, Place 1 tablet (0.4 mg total) under the tongue every 5 (five) minutes as needed for chest pain. (Patient not taking: Reported on 08/16/2018), Disp: 25 tablet, Rfl: 6

## 2018-08-16 NOTE — Addendum Note (Signed)
Addended by: Lorretta Harp on: 08/16/2018 12:09 PM   Modules accepted: Orders

## 2018-08-16 NOTE — Patient Instructions (Signed)
Interstitial lung disease: As I described today I do not have a formal diagnosis for this but fortunately the symptoms do not seem to be worsening We will repeat a lung function test to make sure there is no evidence of progression Call me if you have worsening shortness of breath or cough We will check your oxygen level while walking today Get a high-dose flu shot when available Practice good hand hygiene  I will plan on seeing you back in 1 year or sooner if needed

## 2018-08-22 ENCOUNTER — Ambulatory Visit (INDEPENDENT_AMBULATORY_CARE_PROVIDER_SITE_OTHER): Payer: Medicare Other | Admitting: Pulmonary Disease

## 2018-08-22 DIAGNOSIS — J8410 Pulmonary fibrosis, unspecified: Secondary | ICD-10-CM | POA: Diagnosis not present

## 2018-08-22 DIAGNOSIS — Z23 Encounter for immunization: Secondary | ICD-10-CM | POA: Diagnosis not present

## 2018-08-22 LAB — PULMONARY FUNCTION TEST
DL/VA % PRED: 92 %
DL/VA: 4.51 ml/min/mmHg/L
DLCO UNC % PRED: 69 %
DLCO UNC: 17.34 ml/min/mmHg
FEF 25-75 POST: 2.6 L/s
FEF 25-75 PRE: 2.72 L/s
FEF2575-%Change-Post: -4 %
FEF2575-%PRED-POST: 228 %
FEF2575-%PRED-PRE: 238 %
FEV1-%Change-Post: 3 %
FEV1-%Pred-Post: 100 %
FEV1-%Pred-Pre: 97 %
FEV1-POST: 1.8 L
FEV1-Pre: 1.75 L
FEV1FVC-%Change-Post: 2 %
FEV1FVC-%PRED-PRE: 124 %
FEV6-%CHANGE-POST: 0 %
FEV6-%PRED-PRE: 84 %
FEV6-%Pred-Post: 85 %
FEV6-POST: 1.94 L
FEV6-Pre: 1.93 L
FEV6FVC-%PRED-POST: 106 %
FEV6FVC-%Pred-Pre: 106 %
FVC-%CHANGE-POST: 0 %
FVC-%PRED-POST: 80 %
FVC-%Pred-Pre: 79 %
FVC-PRE: 1.93 L
FVC-Post: 1.94 L
POST FEV6/FVC RATIO: 100 %
PRE FEV1/FVC RATIO: 91 %
Post FEV1/FVC ratio: 93 %
Pre FEV6/FVC Ratio: 100 %
RV % pred: 81 %
RV: 2.06 L
TLC % pred: 88 %
TLC: 4.56 L

## 2018-08-22 NOTE — Progress Notes (Signed)
PFT today.Kristin Shaffer

## 2018-08-23 IMAGING — CT CT CHEST HIGH RESOLUTION W/O CM
2 of 5 series · 15 of 36 positions shown, 18 images · non-contrast
Comparison: Chest CT 04/21/2016.

CLINICAL DATA: 85-year-old female with clinical suspicion of
interstitial lung disease. Followup study.

EXAM:
CT CHEST WITHOUT CONTRAST
TECHNIQUE: Multidetector CT imaging of the chest was performed following the
standard protocol without intravenous contrast. High resolution
imaging of the lungs, as well as inspiratory and expiratory imaging,
was performed.

[Series 2: high resolution · axial · 0.59mm/px · z∈[-307,-33]mm · 12 of 151 slices shown, 15 images]
[im 7/151  mediastinal]
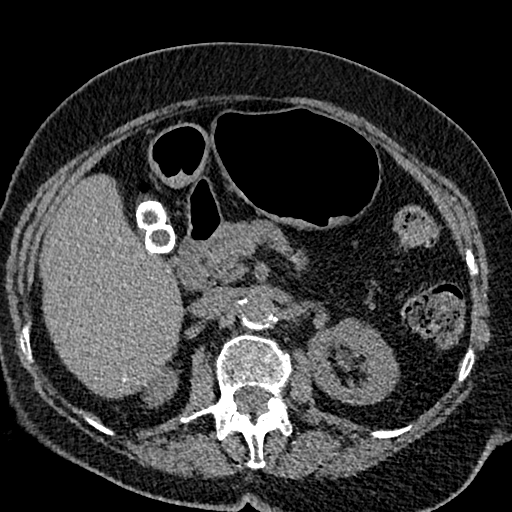
[im 7/151  lung]
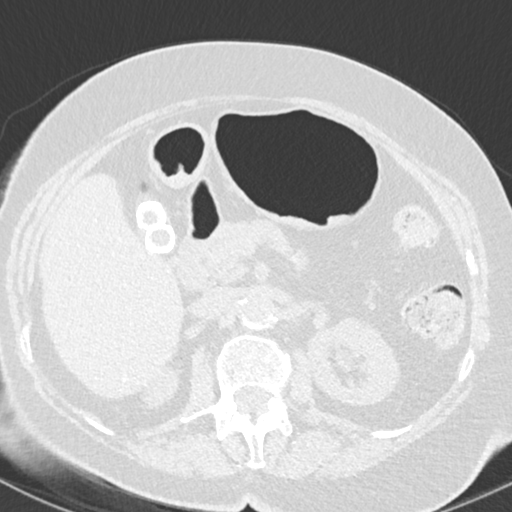
[im 20/151  lung]
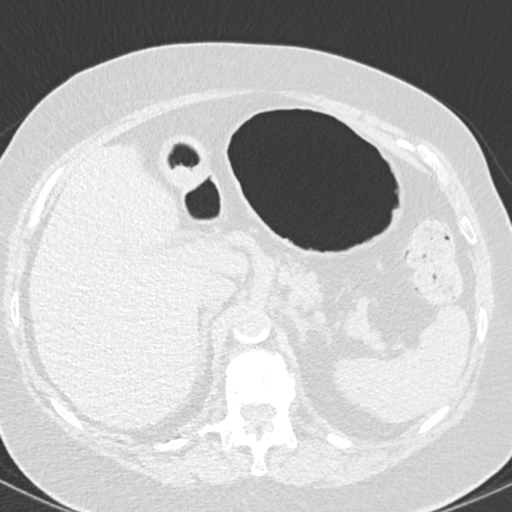
[im 33/151  lung]
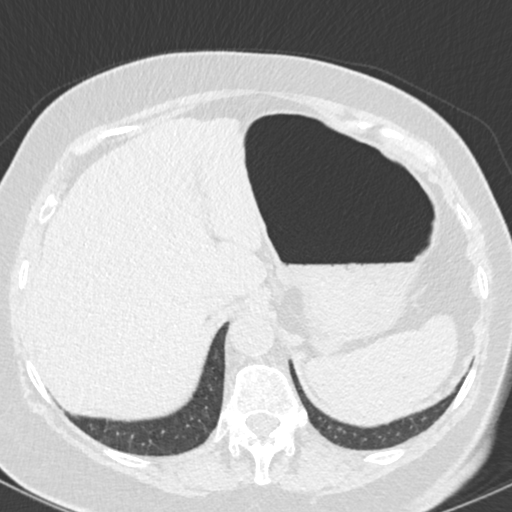
[im 46/151  lung]
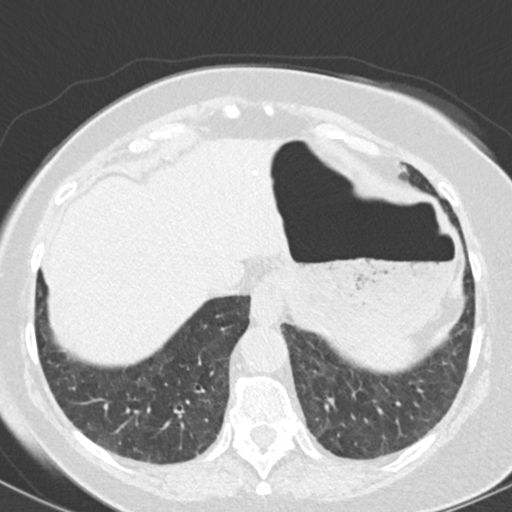
[im 59/151  mediastinal]
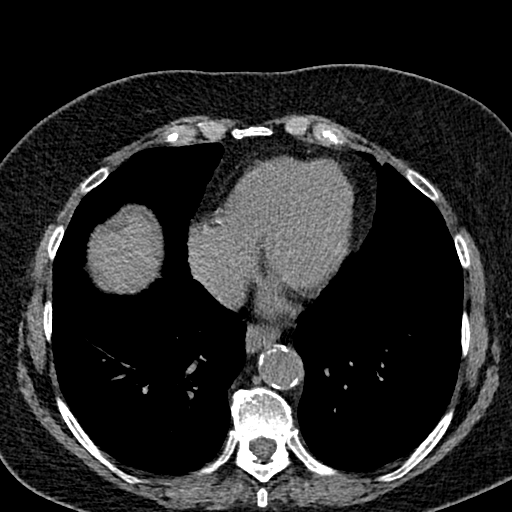
[im 59/151  lung]
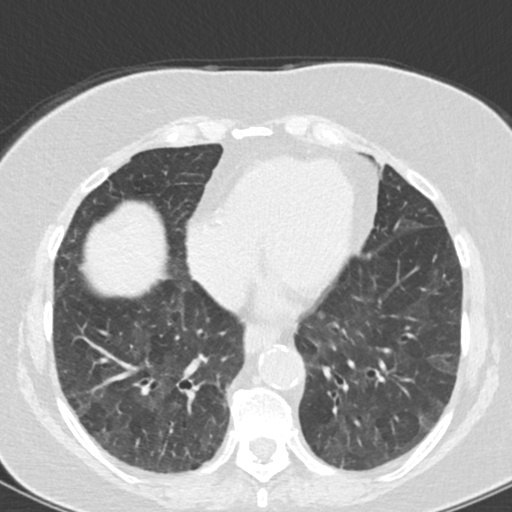
[im 72/151  lung]
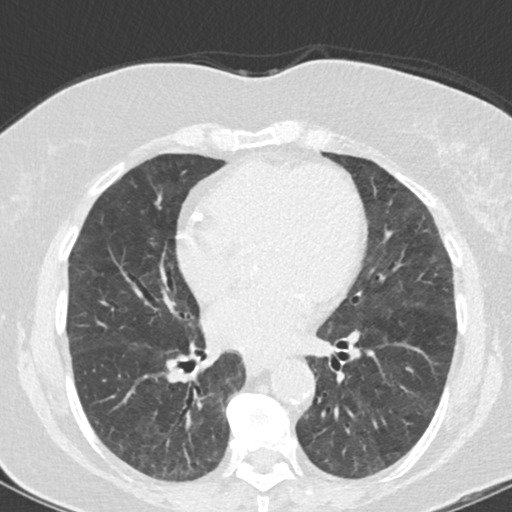
[im 79/151  lung]
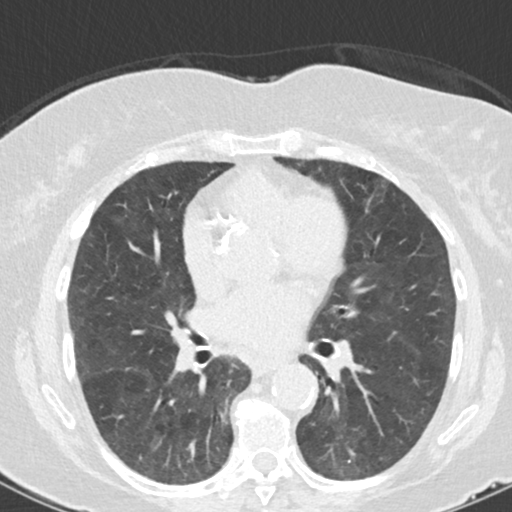
[im 92/151  lung]
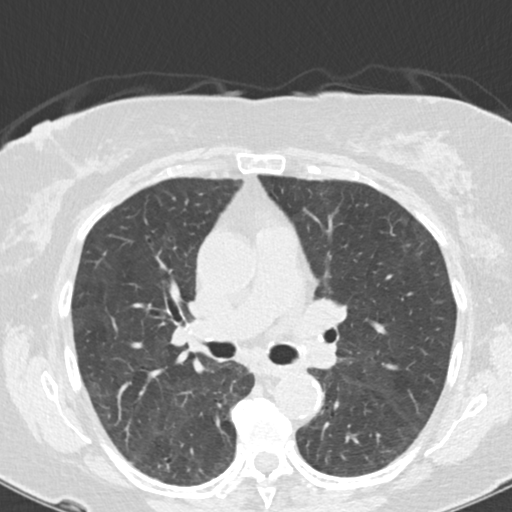
[im 105/151  mediastinal]
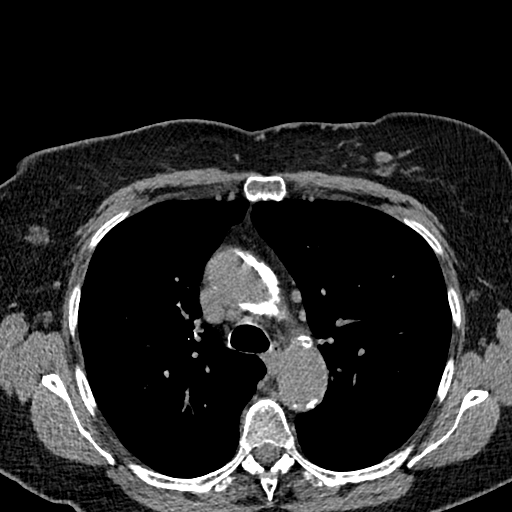
[im 105/151  lung]
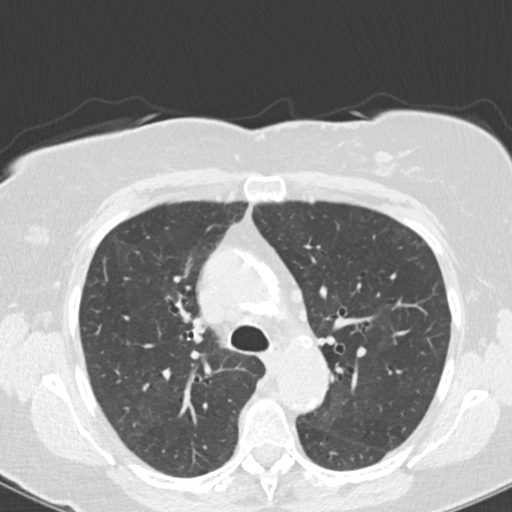
[im 118/151  lung]
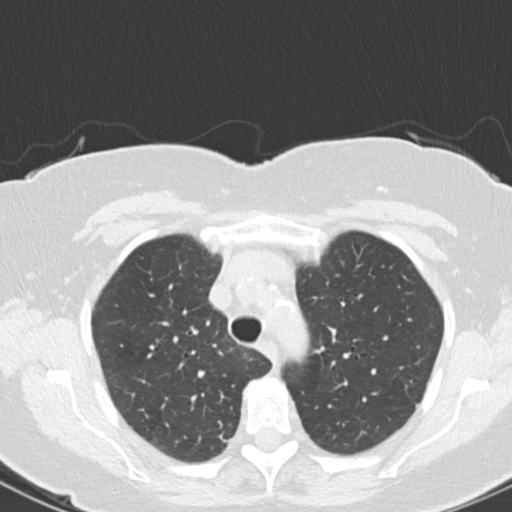
[im 131/151  lung]
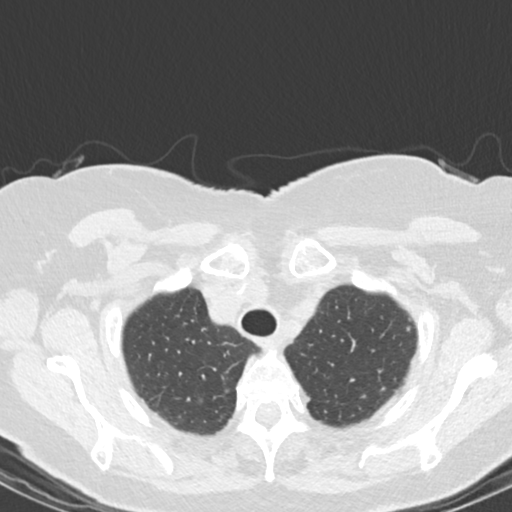
[im 144/151  lung]
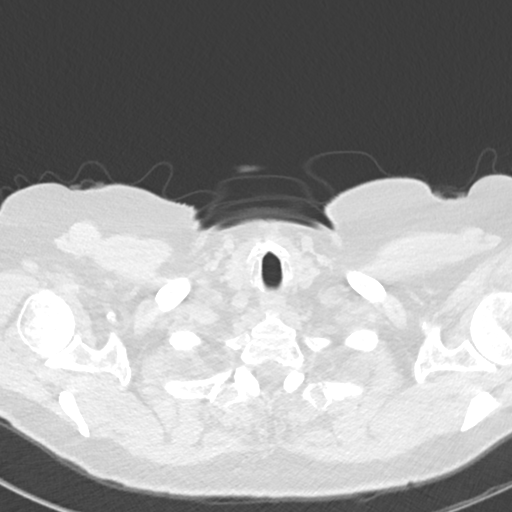

[Series 8: coronal · coronal · 0.59mm/px · 3 of 131 slices shown]
[im 27/131  lung]
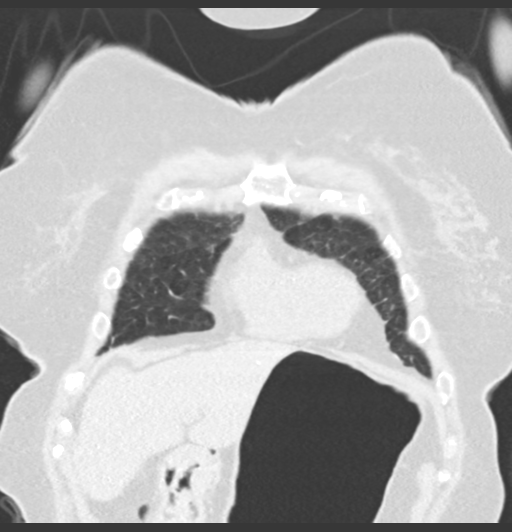
[im 53/131  lung]
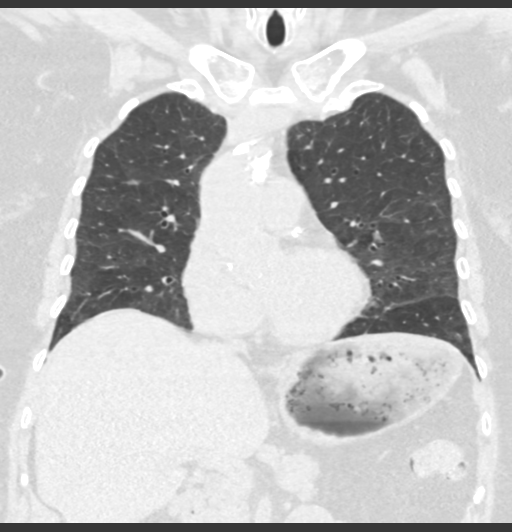
[im 79/131  lung]
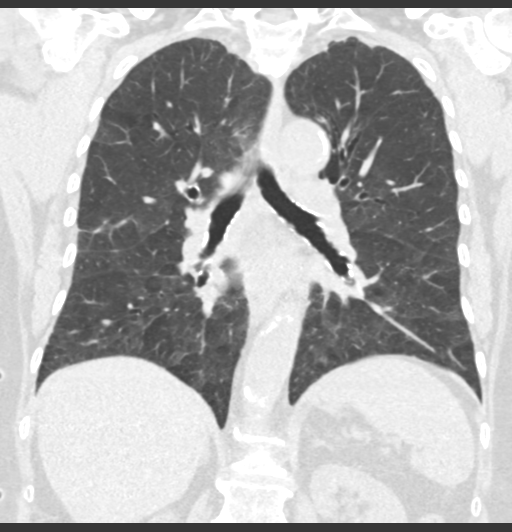

[15 of 36 positions shown; findings below may reference images not displayed]

FINDINGS: Cardiovascular: Heart size is normal. There is no significant
pericardial fluid, thickening or pericardial calcification. There is
aortic atherosclerosis, as well as atherosclerosis of the great
vessels of the mediastinum and the coronary arteries, including
calcified atherosclerotic plaque in the left main, left anterior
descending, left circumflex and right coronary arteries.
Calcifications of the aortic valve and mitral annulus.

Mediastinum/Nodes: No pathologically enlarged mediastinal or hilar
lymph nodes. Please note that accurate exclusion of hilar adenopathy
is limited on noncontrast CT scans. Esophagus is unremarkable in
appearance. No axillary lymphadenopathy.

Lungs/Pleura: Compared to the prior study today's examination
demonstrates subtle worsening of diffuse patchy areas of
ground-glass attenuation throughout the lungs bilaterally. There is
some very mild septal thickening, most evident in the lung bases.
Scattered areas of mild cylindrical bronchiectasis are also noted
throughout the lungs bilaterally. Inspiratory and expiratory imaging
demonstrates moderate air trapping, indicative of small airways
disease. No acute consolidative airspace disease. No pleural
effusions. No suspicious appearing pulmonary nodules or masses are
noted.

Upper Abdomen: Aortic atherosclerosis. Rim calcified gallstones in
the neck of the gallbladder. Small calcified granulomas scattered
throughout the liver.

Musculoskeletal: Old compression fracture of superior endplate of T9
is unchanged. There is a new compression fracture of superior
endplate of L1, with approximately 25% loss of anterior vertebral
body height. There are no aggressive appearing lytic or blastic
lesions noted in the visualized portions of the skeleton. Healing
nondisplaced fractures of the anterior right second and third ribs
and the anterolateral right fifth and sixth ribs are new compared to
the prior examination. There are no aggressive appearing lytic or
blastic lesions noted in the visualized portions of the skeleton.
IMPRESSION: 1. High-resolution images do demonstrate subtle changes suggestive
of interstitial lung disease, and the spectrum of findings at this
time is favored to reflect nonspecific interstitial pneumonia
(NSIP). Repeat high-resolution chest CT is recommended in 12 months
to assess for temporal changes in the appearance of the lung
parenchyma.
2. Aortic atherosclerosis, in addition to left main and 3 vessel
coronary artery disease.
3. Cholelithiasis.
4. New compression fracture of superior endplate of L1 with 25% loss
of anterior vertebral body height, and multiple new healing
right-sided rib fractures, as above.

## 2018-08-27 ENCOUNTER — Telehealth: Payer: Self-pay | Admitting: Pulmonary Disease

## 2018-08-27 NOTE — Telephone Encounter (Signed)
Was able to talk to patient regarding patient's PFT results.  They verbalized an understanding of what was discussed. No further questions at this time.

## 2018-08-27 NOTE — Telephone Encounter (Signed)
Called and spoke with patient, she is requesting results from PFT.  BQ please advise, thank you.

## 2018-08-27 NOTE — Telephone Encounter (Signed)
It was OK, will discuss more next visit

## 2018-08-29 ENCOUNTER — Encounter: Payer: Self-pay | Admitting: Internal Medicine

## 2018-08-29 ENCOUNTER — Ambulatory Visit (INDEPENDENT_AMBULATORY_CARE_PROVIDER_SITE_OTHER): Payer: Medicare Other | Admitting: Internal Medicine

## 2018-08-29 VITALS — BP 122/64 | HR 63 | Ht 65.0 in | Wt 190.6 lb

## 2018-08-29 DIAGNOSIS — E782 Mixed hyperlipidemia: Secondary | ICD-10-CM | POA: Diagnosis not present

## 2018-08-29 DIAGNOSIS — I2510 Atherosclerotic heart disease of native coronary artery without angina pectoris: Secondary | ICD-10-CM

## 2018-08-29 DIAGNOSIS — J8410 Pulmonary fibrosis, unspecified: Secondary | ICD-10-CM | POA: Diagnosis not present

## 2018-08-29 NOTE — Progress Notes (Signed)
OFFICE NOTE  Chief Complaint:  Routine follow-up  Primary Care Physician: Nickola Major, MD  HPI:  Kristin Shaffer is an 84 year old Caucasian female with a history of hyperlipidemia and hypothyroidism. She was seen by myself on March 20, 2013, and at that time was found to be in hypertensive urgency with a blood pressure of 230/94. She was admitted to [facility] for blood pressure control and cardiac catheterization for unstable angina. She did in fact have a non-ST-elevation myocardial infarction with a mildly elevated troponin of 0.67. She underwent coronary angiography, which revealed a subtotal occlusion of the mid right coronary artery. She then underwent a successful complex PCI of the entire mid RCA encompassing a long tubular 40% to 60% lesion that preceded 2 tandem 95% and 99% subtotal occlusions. This was completed using 3 overlapping Xience Xpedition drug-eluting stents. She was placed on aspirin and Brilinta, and was discharged. She subsequently returned to [facility] on April 26 to the emergency room with chest pain. Patient states that it was a "sharp, stabbing, and burning" pain. It was 10 out of 10 in intensity. She took 4 baby aspirin at home, but did not have any nitroglycerin. EMS gave her one, which provided her some relief, and midway to the ER, they gave her another one, which essentially resolved the pain. Patient reports feeling weak, but that is improving compared to when she was discharged. She has had no chest pain since the episode on the 26th. She was provided with a prescription at the ER for sublingual nitroglycerin. She did undergo an echocardiogram which showed preserved LV systolic function and an EF of 60-65%. There was stage I diastolic dysfunction and aortic sclerosis with mild central aortic regurgitation. Otherwise no significant abnormalities.  She also had carotid and renal Dopplers. The carotid Dopplers indicated a small amount of  bilateral plaque. The renal Dopplers did not indicate any renal artery stenosis.  Recently she had lipid profile performed which showed a particle number of 1783, with LDL C. of 143.  Based on this I recommended increasing her Lipitor from 20-40 mg.  She did make this change, but as noted pain and weakness in her joints, especially when she wakes up in the morning which takes about 30 minutes to improve. This sounds a lot like arthritis, but she really feels it is related to her cholesterol medicine.  Overall, at her last office visit I felt she was doing fairly well. Unfortunately about 5 days after that visit she started to have acute onset chest discomfort with radiation of pain into her left arm. She presented to the emergency department and was found to have unstable angina. Cardiac catheterization was performed which demonstrated 99% in-stent stenosis at the previously placed mid RCA stent.  I am interested as to why she presented with acute unstable angina, and not progressive anginal symptoms with exertion. She certainly had absolutely no chest pain or arm pain just a few days prior to the presentation, which is more consistent with an acute coronary syndrome.  Kristin Shaffer returns today for follow-up. She reports that she has had to take nitroglycerin twice for chest discomfort. Wants while riding in a car and another time at rest. Both episodes sound more like reflux however she did have improvement after about 15 minutes. She recently is been having problems with bruising on aspirin and Effient and is concerned about being on strong blood thinners. Her last stent placement was in 11/2013. Dual antiplatelet therapy  was recommended for at least a year.  I saw Kristin Shaffer back in the office today. She is complaining of some weakness and fatigue. She also soreness in her muscles, particularly when she gets up in the morning. Some of it seems to be joint soreness which is concerning for osteoarthritis. She  clearly has signs of osteoarthritis in her DIP joints and there is some crepitus in her knees. It is however possible that some of her symptoms could be related to Lipitor. She is on combination Lipitor and fenofibrate, which is slightly higher risk of developing myalgias. Unfortunately, her cholesterol has been really well controlled.   Kristin Shaffer returns today for follow-up. She recently was planting some irises in her lawn and is complaining of some back pain and spasm. In the past she had some relief from Flexeril, however does not currently have any. She denies any cardiac chest pain. She does get some occasional reflux symptoms associated with certain foods.  Kristin Shaffer see him back in the office again today. She has some intermittent chest discomfort. The symptoms seem atypical and are not like the chest and left arm pain she had in the past. She recently saw her primary care provider who noted some possible fluid in the right lung base. I do hear some faint crackles today however it sounds to me more like atelectasis or even fibrosis. She reports some shortness of breath. She also thinks she may be having reflux symptoms but is hesitant to take omeprazole because she says that she feels that could cause her chest pain. I reassured her that it would not.  04/14/2016  Kristin Shaffer was seen back today in follow-up for review of her echocardiogram. This is essentially unchanged compared to her prior study in 2014. Her pulmonary pressure is elevated at 45 mmHg. LVEF is 60-65%. There is mild diastolic dysfunction. Although her primary pressure is elevated, this is not likely a significant cause of her shortness of breath given the mild elevation. I am concerned however that she may have underlying pulmonary fibrosis. There is evidence of this by chest x-ray in 2015 and she has some dry crackles on exam. I do not believe there is interstitial edema. She is also complaining now of right flank pain. She has some  CVA tenderness but has had no fever, chills or shaking chills. She recently had 2 UTIs apparently which eventually improved with Cipro. She denies any chest pain.  05/22/2016  Kristin Shaffer returns today for follow-up. She underwent CT scan of the chest due to some interstitial changes on her chest x-ray and right flank/lower right posterior chest pain. She reports that this chest pain has improved and resolved although she still notes some shortness of breath with exertion. The CT scan shows a pattern of rather widespread groundglass attenuation most evident in the mid to lower lung fields. There are patchy areas of mild subpleural reticulation noted as well. This is concerning for either non-specific interstitial pneumonia or possibly IPF. Based on these findings, I would recommend a pulmonary evaluation. There is no evidence of a pneumonia. Her BNP is low at 40 and I did not suspect this is congestive heart failure or pulmonary edema. Finally she underwent abdominal ultrasound which demonstrated moderate aortoiliac atherosclerosis which will need follow-up annually.  01/15/2018  Kristin Shaffer returns today for follow-up. She's recently had a number of medical problems include a hospitalization for intracerebral hemorrhage. This was a result of trauma falling down stairs. Fortunately this resolved completely.  She did see Dr. Curt Jews for possible nonspecific interstitial pneumonia but he feels that her radiographic findings an abnormal PFTs may be attributable to chronic aspiration with GERD. She does report shortness of breath with moderate exertion. Blood pressure today was initially elevated 188/71 however recheck was 120/68. She was switched from lisinopril to losartan for persistent cough however is noted no change. She is on over-the-counter H2 blocker for her reflux.  09/28/2017  Kristin Shaffer was seen today in follow-up.  Overall she seems to be doing well.  She saw Dr. Lake Bells for pulmonary fibrosis.  He is  thought that this may be related to prior pesticide exposure or silent aspiration.  He does not feel like he has changed significantly and may not be IPF.  She denies any recurrent chest pain.  She is recovered from her intracerebral hemorrhage.  She is now back on aspirin and Plavix.  She has not had recent lipid testing.  She is n.p.o. today.  Blood pressure is top normal 140/78.  08/29/2018  Kristin Shaffer is seen today in follow-up.  Overall she seems to be doing well.  She had seen Dr. Lake Bells for pulmonary fibrosis and he felt that she did not need any further therapies but wanted to follow-up with her.  She does not have an appointment I encouraged her to reach out.  She denies any chest pain.  Blood pressure is well controlled today 122/64.  EKG shows sinus rhythm.  PMHx:  Past Medical History:  Diagnosis Date  . Anxiety   . Arthritis    "different places; not bad" (03/20/2013)  . Coronary artery disease   . Exertional shortness of breath   . GERD (gastroesophageal reflux disease)   . High cholesterol   . Hypertension   . Hypothyroidism   . Myocardial infarction [facility])    "Dr's saw evidence I might have had a heart attack" (03/20/2013)  . NSTEMI (non-ST elevated myocardial infarction) ([place]) 03/20/2013   cath - mid RCA  . Pneumonia 2012  . Skin cancer    "both legs; right arm" (03/20/2013)    Past Surgical History:  Procedure Laterality Date  . APPENDECTOMY  2003  . BREAST BIOPSY Bilateral 1990s   "total of 5; 2 on one side, 3 on the other; all benign" (03/20/2013)  . CATARACT EXTRACTION W/ INTRAOCULAR LENS  IMPLANT, BILATERAL Bilateral ~ 2008  . CORONARY ANGIOPLASTY  12/08/2013  . CORONARY ANGIOPLASTY WITH STENT PLACEMENT  03/20/2013   NSTEMI - subtotal occlusion of mid RCA - PCI of mid RCA of long tubular 40-60% lesion - 2 tandem 95-99% subtotal occlusions - 3 overlapping Xience Xpedition DES (Dr. Roni Bread)   . [place] OF UTERUS  1956?  Marland Kitchen LEFT HEART CATHETERIZATION WITH  CORONARY ANGIOGRAM N/A 03/20/2013   Procedure: LEFT HEART CATHETERIZATION WITH CORONARY ANGIOGRAM;  Surgeon: Sanda Klein, MD;  Location: [facility] CATH LAB;  Service: Cardiovascular;  Laterality: N/A;  . LEFT HEART CATHETERIZATION WITH CORONARY ANGIOGRAM N/A 12/08/2013   Procedure: LEFT HEART CATHETERIZATION WITH CORONARY ANGIOGRAM;  Surgeon: Blane Ohara, MD;  Location: [facility] C[facility] LAB;  Service: Cardiovascular;  Laterality: N/A;  . SKIN CANCER EXCISION     "1 off right arm; 2 off each leg" (03/20/2013)  . TRANSTHORACIC ECHOCARDIOGRAM  03/2013   EF 76-73%, grade 1 diastolic dysfunction; mild MR; calcifed MV annulus; LA mildly dilated    FAMHx:  Family History  Problem Relation Age of Onset  . Dementia Mother   . Lung cancer Father   .  CAD Father 51  . Cancer Brother     SOCHx:   reports that she has never smoked. She has never used smokeless tobacco. She reports that she does not drink alcohol or use drugs.  ALLERGIES:  Allergies  Allergen Reactions  . Hydrocodone-Acetaminophen Nausea And Vomiting  . Omeprazole Other (See Comments)    Made acid reflux worse  . Zocor [Simvastatin] Other (See Comments)    Trouble Walking, Leg weakness and cramping  . Latex Hives and Rash    ROS: Pertinent items noted in HPI and remainder of comprehensive ROS otherwise negative.  HOME MEDS: Current Outpatient Medications  Medication Sig Dispense Refill  . aspirin EC 81 MG tablet Take 81 mg by mouth daily.    Marland Kitchen atorvastatin (LIPITOR) 20 MG tablet Take 1 tablet (20 mg total) by mouth daily. KEEP OV. 90 tablet 0  . CALCIUM-VITAMIN D PO Take 1 tablet by mouth daily.    . clopidogrel (PLAVIX) 75 MG tablet TAKE 1 TABLET EVERY DAY 90 tablet 3  . ezetimibe (ZETIA) 10 MG tablet TAKE 1/2 TABLET EVERY DAY 45 tablet 3  . levothyroxine (SYNTHROID, LEVOTHROID) 100 MCG tablet Take 100 mcg by mouth daily before breakfast.    . losartan (COZAAR) 50 MG tablet Take 1 tablet (50 mg total) by mouth daily. 90 tablet 0    . Magnesium Oxide 250 MG TABS Take 1 tablet by mouth daily.    . metoprolol tartrate (LOPRESSOR) 25 MG tablet Take 0.5 tablets (12.5 mg total) by mouth 2 (two) times daily. (Patient taking differently: Take 25 mg by mouth 2 (two) times daily. Take one tablet in the morning and one tablet at night) 90 tablet 3  . nitroGLYCERIN (NITROSTAT) 0.4 MG SL tablet Place 1 tablet (0.4 mg total) under the tongue every 5 (five) minutes as needed for chest pain. 25 tablet 6  . pantoprazole (PROTONIX) 20 MG tablet Take by mouth.     No current facility-administered medications for this visit.     LABS/IMAGING: No results found for this or any previous visit (from the past 48 hour(s)). No results found.  VITALS: BP 122/64   Pulse 63   Ht 5\' 5"  (1.651 m)   Wt 190 lb 9.6 oz (86.5 kg)   BMI 31.72 kg/m   EXAM: General appearance: alert and no distress Neck: no carotid bruit and no JVD Lungs: clear to auscultation bilaterally Heart: regular rate and rhythm, S1, S2 normal, no murmur, click, rub or gallop Abdomen: soft, non-tender; bowel sounds normal; no masses,  no organomegaly Extremities: extremities normal, atraumatic, no cyanosis or edema Pulses: 2+ and symmetric Skin: Skin color, texture, turgor normal. No rashes or lesions Neurologic: Grossly normal  EKG: Sinus rhythm at 63-personally reviewed  ASSESSMENT: 1. Coronary artery disease status post recent and STEMI, PCI to the RCA with 4 overlapping Xience drug-eluting stents - now with recent UA and ISR (2015) 2. Hypertension 3. Hypothyroidism 4. GERD 5. Dyslipidemia - now on lipitor, zetia and fenofibrate 6. Low back spasm 7. Progressive DOE - possible pulmonary fibrosis on CT 8. Pulmonary hypertension  9. Right flank pain 10. Moderate aorto-iliac atherosclerosis 11. Recent traumatic ICH  PLAN: 1.   Kristin Shaffer seems to be doing well without any recurrent chest pain.  Her shortness of breath was attributable to possible pulmonary  fibrosis and she has been seen by Dr. Lake Bells.  Blood pressure is well controlled at this point.  She denies any worsening shortness of breath.  Weight has  been stable if not a few pounds lower.  Overall doing well.  No changes to her medicines.  Follow-up sooner as necessary.  Pixie Casino, MD, [facility], [place] Director of the Advanced Lipid Disorders &  Cardiovascular Risk Reduction Clinic Diplomate of the American Board of Clinical Lipidology Attending Cardiologist  Direct Dial: [phone]  Fax: [phone]  Website:  [email or web address] 08/29/2018, 4:35 PM

## 2018-08-29 NOTE — Patient Instructions (Signed)
Your physician recommends that you return for lab work FASTING to check cholesterol   Your physician wants you to follow-up in: ONE YEAR with Dr. Hilty. You will receive a reminder letter in the mail two months in advance. If you don't receive a letter, please call our office to schedule the follow-up appointment.   

## 2018-08-30 ENCOUNTER — Encounter: Payer: Self-pay | Admitting: Internal Medicine

## 2018-09-13 LAB — LIPID PANEL
Chol/HDL Ratio: 2.6 ratio (ref 0.0–4.4)
Cholesterol, Total: 171 mg/dL (ref 100–199)
HDL: 65 mg/dL (ref >39)
LDL Calculated: 77 mg/dL (ref 0–99)
Triglycerides: 144 mg/dL (ref 0–149)
VLDL Cholesterol Cal: 29 mg/dL (ref 5–40)

## 2018-09-16 ENCOUNTER — Encounter: Payer: Self-pay | Admitting: Internal Medicine

## 2018-09-20 ENCOUNTER — Telehealth: Payer: Self-pay | Admitting: Internal Medicine

## 2018-09-20 NOTE — Telephone Encounter (Signed)
New Message  ° ° °Patient calling about lab results. °

## 2018-09-20 NOTE — Telephone Encounter (Signed)
PT AWARE OF LAB RESULTS./CY 

## 2018-10-01 ENCOUNTER — Telehealth: Payer: Self-pay | Admitting: Pulmonary Disease

## 2018-10-01 ENCOUNTER — Other Ambulatory Visit: Payer: Self-pay | Admitting: Internal Medicine

## 2018-10-01 MED ORDER — LOSARTAN POTASSIUM 50 MG PO TABS: 50.0000 mg | ORAL_TABLET | Freq: Every day | ORAL | 3 refills | Status: DC

## 2018-10-01 NOTE — Telephone Encounter (Signed)
ATC pt- no answer with no option to leave voicemail.  Will call back 

## 2018-10-01 NOTE — Telephone Encounter (Signed)
°*  STAT* If patient is at the pharmacy, call can be transferred to refill team.   1. Which medications need to be refilled? (please list name of each medication and dose if known)  New prescription for Losartan   2. Which pharmacy/location (including street and city if local pharmacy) is medication to be sent to?[facility]  3. Do they need a 30 day or 90 day supply? 90 and refills

## 2018-10-03 NOTE — Telephone Encounter (Signed)
Patient returned call, [phone]

## 2018-10-03 NOTE — Telephone Encounter (Signed)
Spoke with pt, she states she already received this RX. Nothing further is needed.

## 2018-10-03 NOTE — Telephone Encounter (Signed)
  ATC pt, no answer. Left message for pt to call back.  This Rx was given to pt by Dr. Debara Pickett.   losartan (COZAAR) 50 MG tablet [[id number]]   Order Details  Dose: 50 mg Route: Oral Frequency: Daily  Note to Pharmacy:  Must keep office visit for future refills. Thanks!      Dispense Quantity: 90 tablet Refills: 3 Fills remaining: --        Sig: Take 1 tablet (50 mg total) by mouth daily.       Written Date: 10/01/18 Expiration Date: 10/01/19    Start Date: 10/01/18 End Date: --         Ordering Provider: Pixie Casino, MD DEA #:  [id number] NPI:  [id number]   Authorizing Provider: Pixie Casino, MD DEA #:  [id number] NPI:  [id number]   Ordering User:  Waylan Rocher, LPN            Original Order:  losartan (COZAAR) 50 MG tablet [[id number]]    Pharmacy:  [place], [place] #:  --    Pharmacy Comments:  --       Fill quantity remaining:  -- Fill quantity used:  -- Next fill due: --

## 2019-01-21 ENCOUNTER — Other Ambulatory Visit: Payer: Self-pay | Admitting: Internal Medicine

## 2019-02-22 ENCOUNTER — Other Ambulatory Visit: Payer: Self-pay | Admitting: Internal Medicine

## 2019-02-24 NOTE — Telephone Encounter (Signed)
RX Refill 

## 2019-03-24 ENCOUNTER — Ambulatory Visit (HOSPITAL_BASED_OUTPATIENT_CLINIC_OR_DEPARTMENT_OTHER)
Admission: EM | Admit: 2019-03-24 | Discharge: 2019-03-24 | Disposition: A | Discharge status: Home / Self Care | Payer: Medicare Other | Attending: Emergency Medicine | Admitting: Emergency Medicine

## 2019-03-24 ENCOUNTER — Other Ambulatory Visit: Payer: Self-pay

## 2019-03-24 ENCOUNTER — Emergency Department (HOSPITAL_BASED_OUTPATIENT_CLINIC_OR_DEPARTMENT_OTHER): Payer: Medicare Other

## 2019-03-24 ENCOUNTER — Encounter (HOSPITAL_COMMUNITY)
Admission: EM | Disposition: A | Discharge status: Home / Self Care | Payer: Self-pay | Source: Home / Self Care | Attending: Emergency Medicine

## 2019-03-24 ENCOUNTER — Emergency Department (HOSPITAL_COMMUNITY): Payer: Medicare Other | Admitting: Certified Registered"

## 2019-03-24 ENCOUNTER — Encounter (HOSPITAL_BASED_OUTPATIENT_CLINIC_OR_DEPARTMENT_OTHER): Payer: Self-pay | Admitting: Emergency Medicine

## 2019-03-24 DIAGNOSIS — M1990 Unspecified osteoarthritis, unspecified site: Secondary | ICD-10-CM | POA: Insufficient documentation

## 2019-03-24 DIAGNOSIS — J342 Deviated nasal septum: Secondary | ICD-10-CM | POA: Insufficient documentation

## 2019-03-24 DIAGNOSIS — F419 Anxiety disorder, unspecified: Secondary | ICD-10-CM | POA: Insufficient documentation

## 2019-03-24 DIAGNOSIS — I700 Atherosclerosis of aorta: Secondary | ICD-10-CM | POA: Insufficient documentation

## 2019-03-24 DIAGNOSIS — Z885 Allergy status to narcotic agent status: Secondary | ICD-10-CM | POA: Diagnosis not present

## 2019-03-24 DIAGNOSIS — I10 Essential (primary) hypertension: Secondary | ICD-10-CM | POA: Insufficient documentation

## 2019-03-24 DIAGNOSIS — Z9841 Cataract extraction status, right eye: Secondary | ICD-10-CM | POA: Insufficient documentation

## 2019-03-24 DIAGNOSIS — S025XXA Fracture of tooth (traumatic), initial encounter for closed fracture: Secondary | ICD-10-CM | POA: Insufficient documentation

## 2019-03-24 DIAGNOSIS — Z955 Presence of coronary angioplasty implant and graft: Secondary | ICD-10-CM | POA: Diagnosis not present

## 2019-03-24 DIAGNOSIS — Z809 Family history of malignant neoplasm, unspecified: Secondary | ICD-10-CM | POA: Insufficient documentation

## 2019-03-24 DIAGNOSIS — Z9842 Cataract extraction status, left eye: Secondary | ICD-10-CM | POA: Insufficient documentation

## 2019-03-24 DIAGNOSIS — N179 Acute kidney failure, unspecified: Secondary | ICD-10-CM | POA: Diagnosis not present

## 2019-03-24 DIAGNOSIS — Z23 Encounter for immunization: Secondary | ICD-10-CM | POA: Diagnosis not present

## 2019-03-24 DIAGNOSIS — S63289A Dislocation of proximal interphalangeal joint of unspecified finger, initial encounter: Secondary | ICD-10-CM

## 2019-03-24 DIAGNOSIS — Z888 Allergy status to other drugs, medicaments and biological substances status: Secondary | ICD-10-CM | POA: Insufficient documentation

## 2019-03-24 DIAGNOSIS — Z8249 Family history of ischemic heart disease and other diseases of the circulatory system: Secondary | ICD-10-CM | POA: Diagnosis not present

## 2019-03-24 DIAGNOSIS — W19XXXA Unspecified fall, initial encounter: Secondary | ICD-10-CM

## 2019-03-24 DIAGNOSIS — S01511A Laceration without foreign body of lip, initial encounter: Secondary | ICD-10-CM

## 2019-03-24 DIAGNOSIS — M19042 Primary osteoarthritis, left hand: Secondary | ICD-10-CM | POA: Insufficient documentation

## 2019-03-24 DIAGNOSIS — Y92009 Unspecified place in unspecified non-institutional (private) residence as the place of occurrence of the external cause: Secondary | ICD-10-CM | POA: Insufficient documentation

## 2019-03-24 DIAGNOSIS — Z961 Presence of intraocular lens: Secondary | ICD-10-CM | POA: Diagnosis not present

## 2019-03-24 DIAGNOSIS — W1839XA Other fall on same level, initial encounter: Secondary | ICD-10-CM | POA: Insufficient documentation

## 2019-03-24 DIAGNOSIS — G479 Sleep disorder, unspecified: Secondary | ICD-10-CM | POA: Insufficient documentation

## 2019-03-24 DIAGNOSIS — Z7902 Long term (current) use of antithrombotics/antiplatelets: Secondary | ICD-10-CM | POA: Insufficient documentation

## 2019-03-24 DIAGNOSIS — Z820 Family history of epilepsy and other diseases of the nervous system: Secondary | ICD-10-CM | POA: Insufficient documentation

## 2019-03-24 DIAGNOSIS — Z87820 Personal history of traumatic brain injury: Secondary | ICD-10-CM | POA: Insufficient documentation

## 2019-03-24 DIAGNOSIS — E7800 Pure hypercholesterolemia, unspecified: Secondary | ICD-10-CM | POA: Insufficient documentation

## 2019-03-24 DIAGNOSIS — Z85828 Personal history of other malignant neoplasm of skin: Secondary | ICD-10-CM | POA: Diagnosis not present

## 2019-03-24 DIAGNOSIS — M19071 Primary osteoarthritis, right ankle and foot: Secondary | ICD-10-CM | POA: Insufficient documentation

## 2019-03-24 DIAGNOSIS — Z79899 Other long term (current) drug therapy: Secondary | ICD-10-CM | POA: Insufficient documentation

## 2019-03-24 DIAGNOSIS — K219 Gastro-esophageal reflux disease without esophagitis: Secondary | ICD-10-CM | POA: Diagnosis not present

## 2019-03-24 DIAGNOSIS — Z801 Family history of malignant neoplasm of trachea, bronchus and lung: Secondary | ICD-10-CM | POA: Insufficient documentation

## 2019-03-24 DIAGNOSIS — Z7982 Long term (current) use of aspirin: Secondary | ICD-10-CM | POA: Insufficient documentation

## 2019-03-24 DIAGNOSIS — M4802 Spinal stenosis, cervical region: Secondary | ICD-10-CM | POA: Insufficient documentation

## 2019-03-24 DIAGNOSIS — I252 Old myocardial infarction: Secondary | ICD-10-CM | POA: Diagnosis not present

## 2019-03-24 DIAGNOSIS — Y9389 Activity, other specified: Secondary | ICD-10-CM | POA: Insufficient documentation

## 2019-03-24 DIAGNOSIS — I2510 Atherosclerotic heart disease of native coronary artery without angina pectoris: Secondary | ICD-10-CM | POA: Diagnosis not present

## 2019-03-24 DIAGNOSIS — Z91040 Latex allergy status: Secondary | ICD-10-CM | POA: Insufficient documentation

## 2019-03-24 DIAGNOSIS — M19012 Primary osteoarthritis, left shoulder: Secondary | ICD-10-CM | POA: Insufficient documentation

## 2019-03-24 DIAGNOSIS — E039 Hypothyroidism, unspecified: Secondary | ICD-10-CM | POA: Insufficient documentation

## 2019-03-24 DIAGNOSIS — K8020 Calculus of gallbladder without cholecystitis without obstruction: Secondary | ICD-10-CM | POA: Insufficient documentation

## 2019-03-24 DIAGNOSIS — S62627A Displaced fracture of medial phalanx of left little finger, initial encounter for closed fracture: Secondary | ICD-10-CM | POA: Diagnosis not present

## 2019-03-24 DIAGNOSIS — J8410 Pulmonary fibrosis, unspecified: Secondary | ICD-10-CM | POA: Insufficient documentation

## 2019-03-24 HISTORY — PX: LACERATION REPAIR: SHX5284

## 2019-03-24 LAB — BASIC METABOLIC PANEL
Anion gap: 6 (ref 5–15)
BUN: 18 mg/dL (ref 8–23)
CO2: 26 mmol/L (ref 22–32)
Calcium: 9.3 mg/dL (ref 8.9–10.3)
Chloride: 106 mmol/L (ref 98–111)
Creatinine, Ser: 0.8 mg/dL (ref 0.44–1.00)
GFR calc Af Amer: >60 mL/min (ref >60)
GFR calc non Af Amer: >60 mL/min (ref >60)
Glucose, Bld: 125 mg/dL — ABNORMAL HIGH (ref 70–99)
Potassium: 4.1 mmol/L (ref 3.5–5.1)
Sodium: 138 mmol/L (ref 135–145)

## 2019-03-24 LAB — CBC WITH DIFFERENTIAL/PLATELET
Abs Immature Granulocytes: 0.03 10*3/uL (ref 0.00–0.07)
Basophils Absolute: 0 10*3/uL (ref 0.0–0.1)
Basophils Relative: 0 %
Eosinophils Absolute: 0.2 10*3/uL (ref 0.0–0.5)
Eosinophils Relative: 3 %
HCT: 42.3 % (ref 36.0–46.0)
Hemoglobin: 13.8 g/dL (ref 12.0–15.0)
Immature Granulocytes: 0 %
Lymphocytes Relative: 21 %
Lymphs Abs: 1.5 10*3/uL (ref 0.7–4.0)
MCH: 31 pg (ref 26.0–34.0)
MCHC: 32.6 g/dL (ref 30.0–36.0)
MCV: 95.1 fL (ref 80.0–100.0)
Monocytes Absolute: 0.6 10*3/uL (ref 0.1–1.0)
Monocytes Relative: 8 %
Neutro Abs: 4.8 10*3/uL (ref 1.7–7.7)
Neutrophils Relative %: 68 %
Platelets: 180 10*3/uL (ref 150–400)
RBC: 4.45 MIL/uL (ref 3.87–5.11)
RDW: 13.4 % (ref 11.5–15.5)
WBC: 7.1 10*3/uL (ref 4.0–10.5)
nRBC: 0 % (ref 0.0–0.2)

## 2019-03-24 LAB — PROTIME-INR
INR: 0.9 (ref 0.8–1.2)
Prothrombin Time: 12.4 seconds (ref 11.4–15.2)

## 2019-03-24 SURGERY — REPAIR, LACERATION, 2 OR MORE
Anesthesia: General | Site: Face | Laterality: Right

## 2019-03-24 MED ORDER — CEFAZOLIN SODIUM-DEXTROSE 2-4 GM/100ML-% IV SOLN
INTRAVENOUS | Status: AC
  Filled 2019-03-24: qty 100

## 2019-03-24 MED ORDER — ONDANSETRON HCL 4 MG/2ML IJ SOLN
INTRAMUSCULAR | Status: DC | PRN
  Administered 2019-03-24: 4 mg via INTRAVENOUS

## 2019-03-24 MED ORDER — LIDOCAINE 2% (20 MG/ML) 5 ML SYRINGE
INTRAMUSCULAR | Status: DC | PRN
  Administered 2019-03-24: 40 mg via INTRAVENOUS

## 2019-03-24 MED ORDER — FENTANYL CITRATE (PF) 100 MCG/2ML IJ SOLN
25.0000 ug | INTRAMUSCULAR | Status: DC | PRN
  Administered 2019-03-24: 25 ug via INTRAVENOUS

## 2019-03-24 MED ORDER — SODIUM CHLORIDE 0.9 % IV SOLN
INTRAVENOUS | Status: DC | PRN
  Administered 2019-03-24: 40 ug/min via INTRAVENOUS

## 2019-03-24 MED ORDER — DEXAMETHASONE SODIUM PHOSPHATE 10 MG/ML IJ SOLN
INTRAMUSCULAR | Status: DC | PRN
  Administered 2019-03-24: 10 mg via INTRAVENOUS

## 2019-03-24 MED ORDER — LABETALOL HCL 5 MG/ML IV SOLN
INTRAVENOUS | Status: AC
  Filled 2019-03-24: qty 4

## 2019-03-24 MED ORDER — DOUBLE ANTIBIOTIC 500-10000 UNIT/GM EX OINT
TOPICAL_OINTMENT | CUTANEOUS | Status: AC
  Filled 2019-03-24: qty 1

## 2019-03-24 MED ORDER — FENTANYL CITRATE (PF) 250 MCG/5ML IJ SOLN
INTRAMUSCULAR | Status: AC
  Filled 2019-03-24: qty 5

## 2019-03-24 MED ORDER — LACTATED RINGERS IV SOLN
INTRAVENOUS | Status: DC
  Administered 2019-03-24: 17:00:00 via INTRAVENOUS

## 2019-03-24 MED ORDER — PROPOFOL 10 MG/ML IV BOLUS
INTRAVENOUS | Status: DC | PRN
  Administered 2019-03-24 (×2): 20 mg via INTRAVENOUS
  Administered 2019-03-24: 130 mg via INTRAVENOUS

## 2019-03-24 MED ORDER — LACTATED RINGERS IV SOLN
INTRAVENOUS | Status: DC | PRN
  Administered 2019-03-24: 17:00:00 via INTRAVENOUS

## 2019-03-24 MED ORDER — LIDOCAINE HCL (PF) 1 % IJ SOLN
10.0000 mL | Freq: Once | INTRAMUSCULAR | Status: AC
  Administered 2019-03-24: 14:00:00 10 mL via INTRADERMAL
  Filled 2019-03-24: qty 10

## 2019-03-24 MED ORDER — LIDOCAINE-EPINEPHRINE 1 %-1:100000 IJ SOLN
INTRAMUSCULAR | Status: AC
  Filled 2019-03-24: qty 1

## 2019-03-24 MED ORDER — FENTANYL CITRATE (PF) 250 MCG/5ML IJ SOLN
INTRAMUSCULAR | Status: DC | PRN
  Administered 2019-03-24 (×2): 50 ug via INTRAVENOUS

## 2019-03-24 MED ORDER — LIDOCAINE HCL (PF) 1 % IJ SOLN
INTRAMUSCULAR | Status: AC
  Filled 2019-03-24: qty 30

## 2019-03-24 MED ORDER — LABETALOL HCL 5 MG/ML IV SOLN
INTRAVENOUS | Status: DC | PRN
  Administered 2019-03-24: 5 mg via INTRAVENOUS

## 2019-03-24 MED ORDER — FENTANYL CITRATE (PF) 100 MCG/2ML IJ SOLN
INTRAMUSCULAR | Status: AC
  Filled 2019-03-24: qty 2

## 2019-03-24 MED ORDER — FENTANYL CITRATE (PF) 100 MCG/2ML IJ SOLN
12.5000 ug | Freq: Once | INTRAMUSCULAR | Status: AC
  Administered 2019-03-24: 12.5 ug via INTRAVENOUS
  Filled 2019-03-24: qty 2

## 2019-03-24 MED ORDER — ONDANSETRON HCL 4 MG/2ML IJ SOLN: 4.0000 mg | Freq: Once | INTRAMUSCULAR | Status: DC | PRN

## 2019-03-24 MED ORDER — TETANUS-DIPHTH-ACELL PERTUSSIS 5-2.5-18.5 LF-MCG/0.5 IM SUSP
0.5000 mL | Freq: Once | INTRAMUSCULAR | Status: AC
  Administered 2019-03-24: 16:00:00 0.5 mL via INTRAMUSCULAR
  Filled 2019-03-24: qty 0.5

## 2019-03-24 MED ORDER — CEPHALEXIN 500 MG PO CAPS: 500.0000 mg | ORAL_CAPSULE | Freq: Three times a day (TID) | ORAL | 0 refills | Status: AC

## 2019-03-24 MED ORDER — SUCCINYLCHOLINE CHLORIDE 20 MG/ML IJ SOLN
INTRAMUSCULAR | Status: DC | PRN
  Administered 2019-03-24: 130 mg via INTRAVENOUS

## 2019-03-24 MED ORDER — CEFAZOLIN SODIUM-DEXTROSE 2-4 GM/100ML-% IV SOLN
2.0000 g | Freq: Once | INTRAVENOUS | Status: AC
  Administered 2019-03-24: 2 g via INTRAVENOUS

## 2019-03-24 SURGICAL SUPPLY — 40 items
BLADE SURG 15 STRL LF DISP TIS (BLADE) IMPLANT
BLADE SURG 15 STRL SS (BLADE)
CANISTER SUCT 3000ML PPV (MISCELLANEOUS) ×3 IMPLANT
CLEANER TIP ELECTROSURG 2X2 (MISCELLANEOUS) ×3 IMPLANT
CORDS BIPOLAR (ELECTRODE) IMPLANT
COVER SURGICAL LIGHT HANDLE (MISCELLANEOUS) ×3 IMPLANT
COVER WAND RF STERILE (DRAPES) ×3 IMPLANT
DRAPE HALF SHEET 40X57 (DRAPES) IMPLANT
ELECT COATED BLADE 2.86 ST (ELECTRODE) ×3 IMPLANT
ELECT NEEDLE TIP 2.8 STRL (NEEDLE) IMPLANT
ELECT REM PT RETURN 9FT ADLT (ELECTROSURGICAL) ×3
ELECTRODE REM PT RTRN 9FT ADLT (ELECTROSURGICAL) ×1 IMPLANT
EVACUATOR SILICONE 100CC (DRAIN) IMPLANT
GLOVE BIOGEL M 7.0 STRL (GLOVE) ×6 IMPLANT
GOWN STRL REUS W/ TWL LRG LVL3 (GOWN DISPOSABLE) ×2 IMPLANT
GOWN STRL REUS W/TWL LRG LVL3 (GOWN DISPOSABLE) ×4
KIT BASIN OR (CUSTOM PROCEDURE TRAY) ×3 IMPLANT
KIT TURNOVER KIT B (KITS) ×3 IMPLANT
LOCATOR NERVE 3 VOLT (DISPOSABLE) IMPLANT
NEEDLE HYPO 25GX1X1/2 BEV (NEEDLE) IMPLANT
NS IRRIG 1000ML POUR BTL (IV SOLUTION) ×3 IMPLANT
PAD ARMBOARD 7.5X6 YLW CONV (MISCELLANEOUS) ×6 IMPLANT
PENCIL BUTTON HOLSTER BLD 10FT (ELECTRODE) ×3 IMPLANT
STAPLER VISISTAT 35W (STAPLE) IMPLANT
SUT CHROMIC 4 0 P 3 18 (SUTURE) IMPLANT
SUT ETHILON 3 0 PS 1 (SUTURE) IMPLANT
SUT ETHILON 5 0 P 3 18 (SUTURE)
SUT ETHILON 5 0 PS 2 18 (SUTURE) IMPLANT
SUT NYLON ETHILON 5-0 P-3 1X18 (SUTURE) IMPLANT
SUT PLAIN 5 0 P 3 18 (SUTURE) IMPLANT
SUT SILK 2 0 PERMA HAND 18 BK (SUTURE) IMPLANT
SUT SILK 3 0 REEL (SUTURE) IMPLANT
SUT VIC AB 3-0 PS2 18 (SUTURE)
SUT VIC AB 3-0 PS2 18XBRD (SUTURE) IMPLANT
SUT VIC AB 4-0 P-3 18X BRD (SUTURE) IMPLANT
SUT VIC AB 4-0 P3 18 (SUTURE)
SUT VICRYL ABS 5-0 PS5 18IN (SUTURE) IMPLANT
TOWEL OR 17X24 6PK STRL BLUE (TOWEL DISPOSABLE) ×3 IMPLANT
TRAY ENT MC OR (CUSTOM PROCEDURE TRAY) ×3 IMPLANT
WATER STERILE IRR 1000ML POUR (IV SOLUTION) ×3 IMPLANT

## 2019-03-24 NOTE — H&P (Signed)
Kristin Shaffer is an 85 y.o. female.   Chief Complaint: complex lip laceration HPI: Pt adm from [facility] for treatment of lip laceration  Past Medical History:  Diagnosis Date  . Anxiety   . Arthritis    "different places; not bad" (03/20/2013)  . Coronary artery disease   . Exertional shortness of breath   . GERD (gastroesophageal reflux disease)   . High cholesterol   . Hypertension   . Hypothyroidism   . Myocardial infarction [facility])    "Dr's saw evidence I might have had a heart attack" (03/20/2013)  . NSTEMI (non-ST elevated myocardial infarction) ([place]) 03/20/2013   cath - mid RCA  . Pneumonia 2012  . Skin cancer    "both legs; right arm" (03/20/2013)    Past Surgical History:  Procedure Laterality Date  . APPENDECTOMY  2003  . BREAST BIOPSY Bilateral 1990s   "total of 5; 2 on one side, 3 on the other; all benign" (03/20/2013)  . CATARACT EXTRACTION W/ INTRAOCULAR LENS  IMPLANT, BILATERAL Bilateral ~ 2008  . CORONARY ANGIOPLASTY  12/08/2013  . CORONARY ANGIOPLASTY WITH STENT PLACEMENT  03/20/2013   NSTEMI - subtotal occlusion of mid RCA - PCI of mid RCA of long tubular 40-60% lesion - 2 tandem 95-99% subtotal occlusions - 3 overlapping Xience Xpedition DES (Dr. Roni Bread)   . [place] OF UTERUS  1956?  Marland Kitchen LEFT HEART CATHETERIZATION WITH CORONARY ANGIOGRAM N/A 03/20/2013   Procedure: LEFT HEART CATHETERIZATION WITH CORONARY ANGIOGRAM;  Surgeon: Sanda Klein, MD;  Location: [place] CATH LAB;  Service: Cardiovascular;  Laterality: N/A;  . LEFT HEART CATHETERIZATION WITH CORONARY ANGIOGRAM N/A 12/08/2013   Procedure: LEFT HEART CATHETERIZATION WITH CORONARY ANGIOGRAM;  Surgeon: Blane Ohara, MD;  Location: [facility] C[facility] LAB;  Service: Cardiovascular;  Laterality: N/A;  . SKIN CANCER EXCISION     "1 off right arm; 2 off each leg" (03/20/2013)  . TRANSTHORACIC ECHOCARDIOGRAM  03/2013   EF 42-59%, grade 1 diastolic dysfunction; mild MR; calcifed MV annulus; LA mildly dilated     Family History  Problem Relation Age of Onset  . Dementia Mother   . Lung cancer Father   . CAD Father 70  . Cancer Brother    Social History:  reports that she has never smoked. She has never used smokeless tobacco. She reports that she does not drink alcohol or use drugs.  Allergies:  Allergies  Allergen Reactions  . Hydrocodone-Acetaminophen Nausea And Vomiting  . Omeprazole Other (See Comments)    Made acid reflux worse  . Zocor [Simvastatin] Other (See Comments)    Trouble Walking, Leg weakness and cramping  . Latex Hives and Rash    Medications Prior to Admission  Medication Sig Dispense Refill  . aspirin EC 81 MG tablet Take 81 mg by mouth daily.    Marland Kitchen atorvastatin (LIPITOR) 20 MG tablet TAKE 1 TABLET EVERY DAY 90 tablet 1  . CALCIUM-VITAMIN D PO Take 1 tablet by mouth daily.    . clopidogrel (PLAVIX) 75 MG tablet TAKE 1 TABLET EVERY DAY 90 tablet 3  . ezetimibe (ZETIA) 10 MG tablet TAKE 1/2 TABLET EVERY DAY 45 tablet 3  . levothyroxine (SYNTHROID, LEVOTHROID) 100 MCG tablet Take 100 mcg by mouth daily before breakfast.    . losartan (COZAAR) 50 MG tablet Take 1 tablet (50 mg total) by mouth daily. 90 tablet 3  . Magnesium Oxide 250 MG TABS Take 1 tablet by mouth daily.    . metoprolol  tartrate (LOPRESSOR) 25 MG tablet Take 0.5 tablets (12.5 mg total) by mouth 2 (two) times daily. (Patient taking differently: Take 25 mg by mouth 2 (two) times daily. Take one tablet in the morning and one tablet at night) 90 tablet 3  . pantoprazole (PROTONIX) 20 MG tablet Take by mouth.    . nitroGLYCERIN (NITROSTAT) 0.4 MG SL tablet Place 1 tablet (0.4 mg total) under the tongue every 5 (five) minutes as needed for chest pain. 25 tablet 6    Results for orders placed or performed during the hospital encounter of 03/24/19 (from the past 48 hour(s))  CBC with Differential     Status: None   Collection Time: 03/24/19 12:03 PM  Result Value Ref Range   WBC 7.1 4.0 - 10.5 K/uL   RBC  4.45 3.87 - 5.11 MIL/uL   Hemoglobin 13.8 12.0 - 15.0 g/dL   HCT 42.3 36.0 - 46.0 %   MCV 95.1 80.0 - 100.0 fL   MCH 31.0 26.0 - 34.0 pg   MCHC 32.6 30.0 - 36.0 g/dL   RDW 13.4 11.5 - 15.5 %   Platelets 180 150 - 400 K/uL   nRBC 0.0 0.0 - 0.2 %   Neutrophils Relative % 68 %   Neutro Abs 4.8 1.7 - 7.7 K/uL   Lymphocytes Relative 21 %   Lymphs Abs 1.5 0.7 - 4.0 K/uL   Monocytes Relative 8 %   Monocytes Absolute 0.6 0.1 - 1.0 K/uL   Eosinophils Relative 3 %   Eosinophils Absolute 0.2 0.0 - 0.5 K/uL   Basophils Relative 0 %   Basophils Absolute 0.0 0.0 - 0.1 K/uL   Immature Granulocytes 0 %   Abs Immature Granulocytes 0.03 0.00 - 0.07 K/uL    Comment: Performed at [facility], [address] [place], [place] [address]  Basic metabolic panel     Status: Abnormal   Collection Time: 03/24/19 12:03 PM  Result Value Ref Range   Sodium 138 135 - 145 mmol/L   Potassium 4.1 3.5 - 5.1 mmol/L   Chloride 106 98 - 111 mmol/L   CO2 26 22 - 32 mmol/L   Glucose, Bld 125 (H) 70 - 99 mg/dL   BUN 18 8 - 23 mg/dL   Creatinine, Ser 0.80 0.44 - 1.00 mg/dL   Calcium 9.3 8.9 - 10.3 mg/dL   GFR calc non Af Amer >60 >60 mL/min   GFR calc Af Amer >60 >60 mL/min   Anion gap 6 5 - 15    Comment: Performed at [facility], [address] [place], [place] [address]  Protime-INR     Status: None   Collection Time: 03/24/19 12:03 PM  Result Value Ref Range   Prothrombin Time 12.4 11.4 - 15.2 seconds   INR 0.9 0.8 - 1.2    Comment: (NOTE) INR goal varies based on device and disease states. Performed at [facility], [address], [place], [place] [address]    Dg Chest 2 View  Result Date: 03/24/2019 CLINICAL DATA:  85 year old who fell today at home. Patient says that she was ironing, stood up and fell while turning, striking her face against a table. Patient currently anticoagulated on Plavix. Patient complains of neck pain, headache, BILATERAL shoulder pain, LEFT  small finger pain, RIGHT knee pain and RIGHT foot pain. Initial encounter. EXAM: CHEST - 2 VIEW COMPARISON:  High-resolution CT chest 12/22/2016. Chest x-ray 09/12/2016 and earlier. FINDINGS: Cardiac silhouette  normal in size, unchanged. Thoracic aorta atherosclerotic. Hilar and mediastinal contours otherwise unremarkable. Prominent bronchovascular markings diffusely, unchanged, shown on prior high-resolution CT to likely represent nonspecific interstitial pneumonitis (NSIP). Lungs otherwise clear. No localized airspace consolidation. No pleural effusions. No pneumothorax. Normal pulmonary vascularity. Degenerative changes involving the thoracic and visualized lumbar spine. Compression fracture of the UPPER endplate of T9 is unchanged since the prior CT. Calcified gallstones again noted in the RIGHT UPPER QUADRANT of the abdomen. IMPRESSION: 1. No acute cardiopulmonary disease. 2. Stable chronic interstitial lung disease shown on prior high-resolution CT to likely represent NSIP. 3. Cholelithiasis. 4.  Aortic Atherosclerosis (ICD10-170.0) Electronically Signed   By: Evangeline Dakin M.D.   On: 03/24/2019 12:49   Dg Shoulder Right  Result Date: 03/24/2019 CLINICAL DATA:  85 year old who fell today at home. Patient says that she was ironing, stood up and fell while turning, striking her face against a table. Patient currently anticoagulated on Plavix. Patient complains of neck pain, headache, BILATERAL shoulder pain, LEFT small finger pain, RIGHT knee pain and RIGHT foot pain. Initial encounter. EXAM: RIGHT SHOULDER - 2+ VIEW COMPARISON:  None. FINDINGS: No evidence of acute fracture. Glenohumeral joint anatomically aligned with well-preserved joint space. Acromioclavicular joint intact with minimal degenerative changes. Mild osseous demineralization. IMPRESSION: No acute osseous abnormality. Electronically Signed   By: Evangeline Dakin M.D.   On: 03/24/2019 12:50   Dg Wrist Complete Left  Result Date:  03/24/2019 CLINICAL DATA:  Left wrist pain after fall. EXAM: LEFT WRIST - COMPLETE 3+ VIEW COMPARISON:  Radiographs of March 24, 2019. FINDINGS: There is no evidence of fracture or dislocation. Severe narrowing osteophyte formation is seen involving the first carpometacarpal joint. Probable subchondral cyst formation is seen in the lunate. Soft tissues are unremarkable. IMPRESSION: Osteoarthritis of first carpometacarpal joint. No acute abnormality seen in the left wrist. Electronically Signed   By: Marijo Conception M.D.   On: 03/24/2019 12:59   Ct Head Wo Contrast  Result Date: 03/24/2019 CLINICAL DATA:  Pt reports fell today at home, was ironing , stood up and fell while turning, hit face against table, obvious upper laceration, two broken teeth and obvious left fifth finger deformity. No loss of consciousness. Alert and oriented. EXAM: CT HEAD WITHOUT CONTRAST CT MAXILLOFACIAL WITHOUT CONTRAST CT CERVICAL SPINE WITHOUT CONTRAST TECHNIQUE: Multidetector CT imaging of the head, cervical spine, and maxillofacial structures were performed using the standard protocol without intravenous contrast. Multiplanar CT image reconstructions of the cervical spine and maxillofacial structures were also generated. COMPARISON:  None. FINDINGS: CT HEAD FINDINGS Brain: No evidence of acute infarction, hemorrhage, extra-axial collection, ventriculomegaly, or mass effect. Small area of right frontal lobe low attenuation likely reflecting prior insult. Generalized cerebral atrophy. Periventricular white matter low attenuation likely secondary to microangiopathy. Vascular: Cerebrovascular atherosclerotic calcifications are noted. Skull: Negative for fracture or focal lesion. Sinuses/Orbits: Visualized portions of the orbits are unremarkable. Visualized portions of the paranasal sinuses and mastoid air cells are unremarkable. Other: None. CT MAXILLOFACIAL FINDINGS Osseous: No fracture or mandibular dislocation. No destructive  process. Orbits: Negative. No traumatic or inflammatory finding. Sinuses: Paranasal sinuses are clear. Mastoid sinuses are clear. Rightward deviation of the nasal septum. Soft tissues: Negative. CT CERVICAL SPINE FINDINGS Alignment: Normal. Skull base and vertebrae: No acute fracture. No primary bone lesion or focal pathologic process. Soft tissues and spinal canal: No prevertebral fluid or swelling. No visible canal hematoma. Disc levels: Degenerative disease with disc height loss at C5-6, C7-T1 and T1-2 severe left and  mild right facet arthropathy at C2-3. At C3-4 there is severe bilateral facet arthropathy. At C4-5 there is moderate bilateral facet arthropathy. At C5-6 there is a broad-based disc osteophyte complex, moderate bilateral facet arthropathy and bilateral foraminal stenosis. Upper chest: Hazy ground-glass opacity at the right lung apex. Other: No fluid collection or hematoma. Bilateral carotid artery atherosclerosis. IMPRESSION: 1. No acute intracranial pathology. 2.  No acute osseous injury of the maxillofacial bones. 3.  No acute osseous injury of the cervical spine. 4. Cervical spine spondylosis as described above. Electronically Signed   By: Kathreen Devoid   On: 03/24/2019 13:02   Ct Cervical Spine Wo Contrast  Result Date: 03/24/2019 CLINICAL DATA:  Pt reports fell today at home, was ironing , stood up and fell while turning, hit face against table, obvious upper laceration, two broken teeth and obvious left fifth finger deformity. No loss of consciousness. Alert and oriented. EXAM: CT HEAD WITHOUT CONTRAST CT MAXILLOFACIAL WITHOUT CONTRAST CT CERVICAL SPINE WITHOUT CONTRAST TECHNIQUE: Multidetector CT imaging of the head, cervical spine, and maxillofacial structures were performed using the standard protocol without intravenous contrast. Multiplanar CT image reconstructions of the cervical spine and maxillofacial structures were also generated. COMPARISON:  None. FINDINGS: CT HEAD FINDINGS  Brain: No evidence of acute infarction, hemorrhage, extra-axial collection, ventriculomegaly, or mass effect. Small area of right frontal lobe low attenuation likely reflecting prior insult. Generalized cerebral atrophy. Periventricular white matter low attenuation likely secondary to microangiopathy. Vascular: Cerebrovascular atherosclerotic calcifications are noted. Skull: Negative for fracture or focal lesion. Sinuses/Orbits: Visualized portions of the orbits are unremarkable. Visualized portions of the paranasal sinuses and mastoid air cells are unremarkable. Other: None. CT MAXILLOFACIAL FINDINGS Osseous: No fracture or mandibular dislocation. No destructive process. Orbits: Negative. No traumatic or inflammatory finding. Sinuses: Paranasal sinuses are clear. Mastoid sinuses are clear. Rightward deviation of the nasal septum. Soft tissues: Negative. CT CERVICAL SPINE FINDINGS Alignment: Normal. Skull base and vertebrae: No acute fracture. No primary bone lesion or focal pathologic process. Soft tissues and spinal canal: No prevertebral fluid or swelling. No visible canal hematoma. Disc levels: Degenerative disease with disc height loss at C5-6, C7-T1 and T1-2 severe left and mild right facet arthropathy at C2-3. At C3-4 there is severe bilateral facet arthropathy. At C4-5 there is moderate bilateral facet arthropathy. At C5-6 there is a broad-based disc osteophyte complex, moderate bilateral facet arthropathy and bilateral foraminal stenosis. Upper chest: Hazy ground-glass opacity at the right lung apex. Other: No fluid collection or hematoma. Bilateral carotid artery atherosclerosis. IMPRESSION: 1. No acute intracranial pathology. 2.  No acute osseous injury of the maxillofacial bones. 3.  No acute osseous injury of the cervical spine. 4. Cervical spine spondylosis as described above. Electronically Signed   By: Kathreen Devoid   On: 03/24/2019 13:02   Dg Shoulder Left  Result Date: 03/24/2019 CLINICAL DATA:   85 year old who fell today at home. Patient says that she was ironing, stood up and fell while turning, striking her face against a table. Patient currently anticoagulated on Plavix. Patient complains of neck pain, headache, BILATERAL shoulder pain, LEFT small finger pain, RIGHT knee pain and RIGHT foot pain. Initial encounter. EXAM: LEFT SHOULDER - 2+ VIEW COMPARISON:  None. FINDINGS: No evidence of acute fracture. Glenohumeral joint anatomically aligned with mild joint space narrowing. Acromioclavicular joint anatomically aligned with mild degenerative changes. Mild osseous demineralization. IMPRESSION: 1. No acute osseous abnormality. 2. Mild degenerative changes involving the acromioclavicular and glenohumeral joints. Electronically Signed   By: Marcello Moores  Lawrence M.D.   On: 03/24/2019 12:54   Dg Knee Complete 4 Views Right  Result Date: 03/24/2019 CLINICAL DATA:  85 year old who fell today at home. Patient says that she was ironing, stood up and fell while turning, striking her face against a table. Patient currently anticoagulated on Plavix. Patient complains of neck pain, headache, BILATERAL shoulder pain, LEFT small finger pain, RIGHT knee pain and RIGHT foot pain. Initial encounter. EXAM: RIGHT KNEE - COMPLETE 4+ VIEW COMPARISON:  None. FINDINGS: Marked prepatellar soft tissue swelling/hematoma. No evidence of acute fracture or dislocation. Moderate MEDIAL and LATERAL compartment joint space narrowing. Mild patellofemoral compartment joint space narrowing. Chondrocalcinosis involving the LATERAL and MEDIAL menisci. Mild osseous demineralization. Small joint effusion. IMPRESSION: 1. No acute osseous abnormality. 2. Marked prepatellar soft tissue swelling/hematoma. 3. 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 4. Small joint effusion. Electronically Signed   By: Evangeline Dakin M.D.   On: 03/24/2019 12:55   Dg Hand Complete Left  Result Date: 03/24/2019 CLINICAL DATA:   85 year old who fell today at home. Patient says that she was ironing, stood up and fell while turning, striking her face against a table. Patient currently anticoagulated on Plavix. Patient complains of neck pain, headache, BILATERAL shoulder pain, LEFT small finger pain, RIGHT knee pain and RIGHT foot pain. Initial encounter. EXAM: LEFT HAND - COMPLETE 3+ VIEW COMPARISON:  None. FINDINGS: Volar dislocation of the PIP joint of the small finger. Associated diffuse soft tissue swelling involving the small finger. No associated fracture is identified. Severe narrowing of the IP joint spaces of the fingers and thumb, most severe involving the DIP joint of the small, long and ring fingers. Moderate narrowing of the first MCP joint space. Remaining MCP joint spaces well-preserved. Severe narrowing of the trapezium-first metacarpal joint in the wrist. Cystic change involving the lunate bone of the wrist with associated sclerosis. Osseous demineralization. IMPRESSION: 1. Volar dislocation of the PIP joint of the small finger without associated fracture. 2. Severe osteoarthritis involving the IP joints of the fingers and thumb. 3. Severe osteoarthritis involving the trapezium-first metacarpal joint in the wrist. 4. Query osteonecrosis involving the lunate bone in the wrist. Electronically Signed   By: Evangeline Dakin M.D.   On: 03/24/2019 12:53   Dg Finger Little Left  Result Date: 03/24/2019 CLINICAL DATA:  Small finger dislocation postreduction. EXAM: LEFT LITTLE FINGER 2+V COMPARISON:  Radiographs earlier the same date. FINDINGS: There is no residual flexion at the PIP joint. There is an acute avulsion fracture involving the dorsal base of the middle phalanx, best seen on the lateral view. This is mildly displaced. There are mild underlying degenerative changes at the PIP and DIP joints with a nonacute appearing osteophyte posterior to the DIP joint. There is diffuse soft tissue swelling throughout the small finger.  IMPRESSION: Dorsal avulsion fracture from the base of 5th middle phalanx with interval realignment at the joint. Electronically Signed   By: Richardean Sale M.D.   On: 03/24/2019 15:42   Dg Foot Complete Right  Result Date: 03/24/2019 CLINICAL DATA:  85 year old who fell today at home. Patient says that she was ironing, stood up and fell while turning, striking her face against a table. Patient currently anticoagulated on Plavix. Patient complains of neck pain, headache, BILATERAL shoulder pain, LEFT small finger pain, RIGHT knee pain and RIGHT foot pain. Initial encounter. EXAM: RIGHT FOOT COMPLETE - 3+ VIEW COMPARISON:  None. FINDINGS: No evidence of acute fracture or dislocation. Mild osseous demineralization. Mild-to-moderate  narrowing of the IP joint spaces of the toes. Mild narrowing of the first MTP joint space. No associated erosions. Very small plantar calcaneal spur. IMPRESSION: 1. No acute osseous abnormality. 2. Mild to moderate osteoarthritis involving the IP joints of the toes. Mild osteoarthritis involving the first MTP joint. Electronically Signed   By: Evangeline Dakin M.D.   On: 03/24/2019 12:57   Ct Maxillofacial Wo Contrast  Result Date: 03/24/2019 CLINICAL DATA:  Pt reports fell today at home, was ironing , stood up and fell while turning, hit face against table, obvious upper laceration, two broken teeth and obvious left fifth finger deformity. No loss of consciousness. Alert and oriented. EXAM: CT HEAD WITHOUT CONTRAST CT MAXILLOFACIAL WITHOUT CONTRAST CT CERVICAL SPINE WITHOUT CONTRAST TECHNIQUE: Multidetector CT imaging of the head, cervical spine, and maxillofacial structures were performed using the standard protocol without intravenous contrast. Multiplanar CT image reconstructions of the cervical spine and maxillofacial structures were also generated. COMPARISON:  None. FINDINGS: CT HEAD FINDINGS Brain: No evidence of acute infarction, hemorrhage, extra-axial collection,  ventriculomegaly, or mass effect. Small area of right frontal lobe low attenuation likely reflecting prior insult. Generalized cerebral atrophy. Periventricular white matter low attenuation likely secondary to microangiopathy. Vascular: Cerebrovascular atherosclerotic calcifications are noted. Skull: Negative for fracture or focal lesion. Sinuses/Orbits: Visualized portions of the orbits are unremarkable. Visualized portions of the paranasal sinuses and mastoid air cells are unremarkable. Other: None. CT MAXILLOFACIAL FINDINGS Osseous: No fracture or mandibular dislocation. No destructive process. Orbits: Negative. No traumatic or inflammatory finding. Sinuses: Paranasal sinuses are clear. Mastoid sinuses are clear. Rightward deviation of the nasal septum. Soft tissues: Negative. CT CERVICAL SPINE FINDINGS Alignment: Normal. Skull base and vertebrae: No acute fracture. No primary bone lesion or focal pathologic process. Soft tissues and spinal canal: No prevertebral fluid or swelling. No visible canal hematoma. Disc levels: Degenerative disease with disc height loss at C5-6, C7-T1 and T1-2 severe left and mild right facet arthropathy at C2-3. At C3-4 there is severe bilateral facet arthropathy. At C4-5 there is moderate bilateral facet arthropathy. At C5-6 there is a broad-based disc osteophyte complex, moderate bilateral facet arthropathy and bilateral foraminal stenosis. Upper chest: Hazy ground-glass opacity at the right lung apex. Other: No fluid collection or hematoma. Bilateral carotid artery atherosclerosis. IMPRESSION: 1. No acute intracranial pathology. 2.  No acute osseous injury of the maxillofacial bones. 3.  No acute osseous injury of the cervical spine. 4. Cervical spine spondylosis as described above. Electronically Signed   By: Kathreen Devoid   On: 03/24/2019 13:02    Review of Systems  Constitutional: Negative.   HENT: Negative.   Respiratory: Negative.   Cardiovascular: Negative.     Blood  pressure (!) 160/72, pulse 64, temperature 97.6 F (36.4 C), temperature source Axillary, resp. rate 13, SpO2 97 %. Physical Exam  Constitutional: She appears well-developed and well-nourished.  HENT:  Upper lip laceration  Neck: Normal range of motion. Neck supple.     Assessment/Plan Adm for OP reconstruction of lip laceration  Jerrell Belfast, MD 03/24/2019, 5:02 PM

## 2019-03-24 NOTE — Anesthesia Procedure Notes (Signed)
Procedure Name: Intubation Performed by: Milford Cage, CRNA Pre-anesthesia Checklist: Patient identified, Emergency Drugs available, Suction available and Patient being monitored Patient Re-evaluated:Patient Re-evaluated prior to induction Oxygen Delivery Method: Circle System Utilized Preoxygenation: Pre-oxygenation with 100% oxygen Induction Type: IV induction and Rapid sequence Laryngoscope Size: Glidescope and 3 Grade View: Grade I Tube type: Oral Tube size: 6.5 mm Number of attempts: 1 Airway Equipment and Method: Stylet and Oral airway Placement Confirmation: ETT inserted through vocal cords under direct vision,  positive ETCO2 and breath sounds checked- equal and bilateral Secured at: 21 cm Tube secured with: Tape Dental Injury: Teeth and Oropharynx as per pre-operative assessment

## 2019-03-24 NOTE — ED Notes (Signed)
Fall risk band placed on pt

## 2019-03-24 NOTE — ED Notes (Signed)
Patient transported to X-ray 

## 2019-03-24 NOTE — ED Provider Notes (Addendum)
[facility] EMERGENCY DEPARTMENT Provider Note   CSN: [id number] Arrival date & time: 03/24/19  1132    History   Chief Complaint Chief Complaint  Patient presents with   Fall    HPI Kristin Shaffer is a 85 y.o. female.     HPI 85 year old female with past medical history as below including multiple history as is on dual antiplatelet therapy here with fall.  The patient currently lives alone.  She was reportedly ironing something at home when she turned to the side to pick something up.  She fell after losing her footing.  She landed directly on her face and bilateral shoulders.  She had to crawl to the phone to call someone.  Since then, she has had severe frontal facial pain including a sensation that her front 2 teeth are loose.  She sustained a large laceration to the lip.  She also has pain of her left small finger, right foot, and bilateral, but right greater than left, shoulder pain.  The pain is aching, throbbing, worse with any movement or palpation.  She denies any recent medication changes.  She was well prior to the fall.  She was not burned, fortunately, by the iron.  No other complaints.  Past Medical History:  Diagnosis Date   Anxiety    Arthritis    "different places; not bad" (03/20/2013)   Coronary artery disease    Exertional shortness of breath    GERD (gastroesophageal reflux disease)    High cholesterol    Hypertension    Hypothyroidism    Myocardial infarction [facility])    "Dr's saw evidence I might have had a heart attack" (03/20/2013)   NSTEMI (non-ST elevated myocardial infarction) ([place]) 03/20/2013   cath - mid RCA   Pneumonia 2012   Skin cancer    "both legs; right arm" (03/20/2013)    Patient Active Problem List   Diagnosis Date Noted   Coronary artery disease involving native coronary artery of native heart without angina pectoris 01/15/2017   Cough 12/29/2016   Intracranial hemorrhage (HCC)    AKI (acute kidney injury)  ([place])    Acute lower UTI    Sleep disturbance    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[place])    Labile blood pressure    Fall 09/13/2016   Syncope    Closed fracture of maxillary sinus (HCC)    Subarachnoid hemorrhage (HCC)    Subdural hematoma (HCC)    Seizure prophylaxis    Coronary artery disease involving coronary bypass graft of native heart without angina pectoris    Essential hypertension    Facial fracture ([place]) 09/09/2016   TBI (traumatic brain injury) ([place]) 09/09/2016   Pulmonary fibrosis ([place]) 05/22/2016   DOE (dyspnea on exertion) 04/14/2016   Right flank pain 04/14/2016   Pulmonary hypertension ([place]) 04/14/2016   Muscle spasm of back 09/15/2015   Myalgia 03/23/2015   Intermediate coronary syndrome ([place]) 12/07/2013   Mixed hyperlipidemia 12/03/2013   Presence of drug coated stent in right coronary artery 03/22/2013   NSTEMI (non-ST elevated myocardial infarction) ([place]) 03/21/2013   Unstable angina 03/20/2013   Hypertensive urgency -Accelerated Hypertension 03/20/2013   GERD (gastroesophageal reflux disease) 03/20/2013   Hypothyroidism 03/20/2013    Past Surgical History:  Procedure Laterality Date   APPENDECTOMY  2003   BREAST BIOPSY Bilateral 1990s   "total of 5; 2 on one side, 3 on the other; all benign" (03/20/2013)   CATARACT EXTRACTION W/ INTRAOCULAR LENS  IMPLANT, BILATERAL Bilateral ~ 2008   CORONARY ANGIOPLASTY  12/08/2013   CORONARY ANGIOPLASTY WITH STENT PLACEMENT  03/20/2013   NSTEMI - subtotal occlusion of mid RCA - PCI of mid RCA of long tubular 40-60% lesion - 2 tandem 95-99% subtotal occlusions - 3 overlapping Xience Xpedition DES (Dr. Roni Bread)    [place]?   LEFT HEART CATHETERIZATION WITH CORONARY ANGIOGRAM N/A 03/20/2013   Procedure: LEFT HEART CATHETERIZATION WITH CORONARY ANGIOGRAM;  Surgeon: Sanda Klein, MD;  Location: [facility] CATH LAB;  Service:  Cardiovascular;  Laterality: N/A;   LEFT HEART CATHETERIZATION WITH CORONARY ANGIOGRAM N/A 12/08/2013   Procedure: LEFT HEART CATHETERIZATION WITH CORONARY ANGIOGRAM;  Surgeon: Blane Ohara, MD;  Location: [facility] CATH LAB;  Service: Cardiovascular;  Laterality: N/A;   SKIN CANCER EXCISION     "1 off right arm; 2 off each leg" (03/20/2013)   TRANSTHORACIC ECHOCARDIOGRAM  03/2013   EF 16-10%, grade 1 diastolic dysfunction; mild MR; calcifed MV annulus; LA mildly dilated     OB History   No obstetric history on file.      Home Medications    Prior to Admission medications   Medication Sig Start Date End Date Taking? Authorizing Provider  aspirin EC 81 MG tablet Take 81 mg by mouth daily.    [provider]  atorvastatin (LIPITOR) 20 MG tablet TAKE 1 TABLET EVERY DAY 01/22/19   Hilty, Nadean Corwin, MD  CALCIUM-VITAMIN D PO Take 1 tablet by mouth daily.    [provider]  clopidogrel (PLAVIX) 75 MG tablet TAKE 1 TABLET EVERY DAY 02/24/19   Skeet Latch, MD  ezetimibe (ZETIA) 10 MG tablet TAKE 1/2 TABLET EVERY DAY 03/11/18   Hilty, Nadean Corwin, MD  levothyroxine (SYNTHROID, LEVOTHROID) 100 MCG tablet Take 100 mcg by mouth daily before breakfast.    [provider]  losartan (COZAAR) 50 MG tablet Take 1 tablet (50 mg total) by mouth daily. 10/01/18   Pixie Casino, MD  Magnesium Oxide 250 MG TABS Take 1 tablet by mouth daily.    [provider]  metoprolol tartrate (LOPRESSOR) 25 MG tablet Take 0.5 tablets (12.5 mg total) by mouth 2 (two) times daily. Patient taking differently: Take 25 mg by mouth 2 (two) times daily. Take one tablet in the morning and one tablet at night 11/15/17   Hilty, Nadean Corwin, MD  nitroGLYCERIN (NITROSTAT) 0.4 MG SL tablet Place 1 tablet (0.4 mg total) under the tongue every 5 (five) minutes as needed for chest pain. 04/27/17   Hilty, Nadean Corwin, MD  pantoprazole (PROTONIX) 20 MG tablet Take by mouth. 04/10/18   [provider]      Family History Family History  Problem Relation Age of Onset   Dementia Mother    Lung cancer Father    CAD Father 10   Cancer Brother     Social History Social History   Tobacco Use   Smoking status: Never Smoker   Smokeless tobacco: Never Used  Substance Use Topics   Alcohol use: No    Alcohol/week: 0.0 standard drinks   Drug use: No     Allergies   Hydrocodone-acetaminophen; Omeprazole; Zocor [simvastatin]; and Latex   Review of Systems Review of Systems  Constitutional: Negative for chills and fever.  HENT: Positive for dental problem and facial swelling. Negative for congestion, rhinorrhea and sore throat.   Eyes: Negative for visual disturbance.  Respiratory: Negative for cough, shortness of breath and wheezing.  Cardiovascular: Negative for chest pain and leg swelling.  Gastrointestinal: Negative for abdominal pain, diarrhea, nausea and vomiting.  Genitourinary: Negative for dysuria, flank pain, vaginal bleeding and vaginal discharge.  Musculoskeletal: Positive for arthralgias. Negative for neck pain.  Skin: Positive for wound. Negative for rash.  Allergic/Immunologic: Negative for immunocompromised state.  Neurological: Negative for syncope and headaches.  Hematological: Does not bruise/bleed easily.  All other systems reviewed and are negative.    Physical Exam Updated Vital Signs BP (!) 160/72 (BP Location: Right Arm)    Pulse 64    Temp 97.6 F (36.4 C) (Axillary)    Resp 13    SpO2 97%   Physical Exam Vitals signs and nursing note reviewed.  Constitutional:      General: She is not in acute distress.    Appearance: She is well-developed.  HENT:     Head: Normocephalic and atraumatic.     Comments: Significant bruising to the nasal bridge, worse on the right.  Dried blood nares.  There is significant tenderness along the nasal bridge and zygoma on right.  No midface instability.  Large, full-thickness, laceration through the philtrum,  with underlying chip to the inferior portion of the right central incisor.  No active bleeding. Eyes:     Conjunctiva/sclera: Conjunctivae normal.  Neck:     Musculoskeletal: Neck supple.     Comments: No midline or paraspinal tenderness Cardiovascular:     Rate and Rhythm: Normal rate and regular rhythm.     Heart sounds: Normal heart sounds. No murmur. No friction rub.  Pulmonary:     Effort: Pulmonary effort is normal. No respiratory distress.     Breath sounds: Normal breath sounds. No wheezing or rales.  Abdominal:     General: There is no distension.     Palpations: Abdomen is soft.     Tenderness: There is no abdominal tenderness.  Musculoskeletal:     Comments: Bruising and tenderness noted on the right greater than left anterior shoulders.  Obvious deformity to the left fifth finger, held in extension at the MCP he and flexion at the PIP and DIP.  Bruising to the right lateral foot and fifth toe.  Tenderness to the right anterior knee with some bruising.  No laxity.  Skin:    General: Skin is warm.     Capillary Refill: Capillary refill takes less than 2 seconds.  Neurological:     Mental Status: She is alert and oriented to person, place, and time.     Motor: No abnormal muscle tone.        ED Treatments / Results  Labs (all labs ordered are listed, but only abnormal results are displayed) Labs Reviewed  BASIC METABOLIC PANEL - Abnormal; Notable for the following components:      Result Value   Glucose, Bld 125 (*)    All other components within normal limits  CBC WITH DIFFERENTIAL/PLATELET  PROTIME-INR    EKG EKG Interpretation  Date/Time:  Monday March 24 2019 12:54:29 EDT Ventricular Rate:  66 PR Interval:    QRS Duration: 99 QT Interval:  413 QTC Calculation: 433 R Axis:   16 Text Interpretation:  Sinus rhythm Confirmed by Lennice Sites ([id number]) on 03/25/2019 9:49:21 AM   Radiology Dg Chest 2 View  Result Date: 03/24/2019 CLINICAL DATA:   85 year old who fell today at home. Patient says that she was ironing, stood up and fell while turning, striking her face against a table. Patient currently anticoagulated on Plavix. Patient  complains of neck pain, headache, BILATERAL shoulder pain, LEFT small finger pain, RIGHT knee pain and RIGHT foot pain. Initial encounter. EXAM: CHEST - 2 VIEW COMPARISON:  High-resolution CT chest 12/22/2016. Chest x-ray 09/12/2016 and earlier. FINDINGS: Cardiac silhouette normal in size, unchanged. Thoracic aorta atherosclerotic. Hilar and mediastinal contours otherwise unremarkable. Prominent bronchovascular markings diffusely, unchanged, shown on prior high-resolution CT to likely represent nonspecific interstitial pneumonitis (NSIP). Lungs otherwise clear. No localized airspace consolidation. No pleural effusions. No pneumothorax. Normal pulmonary vascularity. Degenerative changes involving the thoracic and visualized lumbar spine. Compression fracture of the UPPER endplate of T9 is unchanged since the prior CT. Calcified gallstones again noted in the RIGHT UPPER QUADRANT of the abdomen. IMPRESSION: 1. No acute cardiopulmonary disease. 2. Stable chronic interstitial lung disease shown on prior high-resolution CT to likely represent NSIP. 3. Cholelithiasis. 4.  Aortic Atherosclerosis (ICD10-170.0) Electronically Signed   By: Evangeline Dakin M.D.   On: 03/24/2019 12:49   Dg Shoulder Right  Result Date: 03/24/2019 CLINICAL DATA:  85 year old who fell today at home. Patient says that she was ironing, stood up and fell while turning, striking her face against a table. Patient currently anticoagulated on Plavix. Patient complains of neck pain, headache, BILATERAL shoulder pain, LEFT small finger pain, RIGHT knee pain and RIGHT foot pain. Initial encounter. EXAM: RIGHT SHOULDER - 2+ VIEW COMPARISON:  None. FINDINGS: No evidence of acute fracture. Glenohumeral joint anatomically aligned with well-preserved joint space.  Acromioclavicular joint intact with minimal degenerative changes. Mild osseous demineralization. IMPRESSION: No acute osseous abnormality. Electronically Signed   By: Evangeline Dakin M.D.   On: 03/24/2019 12:50   Dg Wrist Complete Left  Result Date: 03/24/2019 CLINICAL DATA:  Left wrist pain after fall. EXAM: LEFT WRIST - COMPLETE 3+ VIEW COMPARISON:  Radiographs of March 24, 2019. FINDINGS: There is no evidence of fracture or dislocation. Severe narrowing osteophyte formation is seen involving the first carpometacarpal joint. Probable subchondral cyst formation is seen in the lunate. Soft tissues are unremarkable. IMPRESSION: Osteoarthritis of first carpometacarpal joint. No acute abnormality seen in the left wrist. Electronically Signed   By: Marijo Conception M.D.   On: 03/24/2019 12:59   Ct Head Wo Contrast  Result Date: 03/24/2019 CLINICAL DATA:  Pt reports fell today at home, was ironing , stood up and fell while turning, hit face against table, obvious upper laceration, two broken teeth and obvious left fifth finger deformity. No loss of consciousness. Alert and oriented. EXAM: CT HEAD WITHOUT CONTRAST CT MAXILLOFACIAL WITHOUT CONTRAST CT CERVICAL SPINE WITHOUT CONTRAST TECHNIQUE: Multidetector CT imaging of the head, cervical spine, and maxillofacial structures were performed using the standard protocol without intravenous contrast. Multiplanar CT image reconstructions of the cervical spine and maxillofacial structures were also generated. COMPARISON:  None. FINDINGS: CT HEAD FINDINGS Brain: No evidence of acute infarction, hemorrhage, extra-axial collection, ventriculomegaly, or mass effect. Small area of right frontal lobe low attenuation likely reflecting prior insult. Generalized cerebral atrophy. Periventricular white matter low attenuation likely secondary to microangiopathy. Vascular: Cerebrovascular atherosclerotic calcifications are noted. Skull: Negative for fracture or focal lesion.  Sinuses/Orbits: Visualized portions of the orbits are unremarkable. Visualized portions of the paranasal sinuses and mastoid air cells are unremarkable. Other: None. CT MAXILLOFACIAL FINDINGS Osseous: No fracture or mandibular dislocation. No destructive process. Orbits: Negative. No traumatic or inflammatory finding. Sinuses: Paranasal sinuses are clear. Mastoid sinuses are clear. Rightward deviation of the nasal septum. Soft tissues: Negative. CT CERVICAL SPINE FINDINGS Alignment: Normal. Skull base and vertebrae: No  acute fracture. No primary bone lesion or focal pathologic process. Soft tissues and spinal canal: No prevertebral fluid or swelling. No visible canal hematoma. Disc levels: Degenerative disease with disc height loss at C5-6, C7-T1 and T1-2 severe left and mild right facet arthropathy at C2-3. At C3-4 there is severe bilateral facet arthropathy. At C4-5 there is moderate bilateral facet arthropathy. At C5-6 there is a broad-based disc osteophyte complex, moderate bilateral facet arthropathy and bilateral foraminal stenosis. Upper chest: Hazy ground-glass opacity at the right lung apex. Other: No fluid collection or hematoma. Bilateral carotid artery atherosclerosis. IMPRESSION: 1. No acute intracranial pathology. 2.  No acute osseous injury of the maxillofacial bones. 3.  No acute osseous injury of the cervical spine. 4. Cervical spine spondylosis as described above. Electronically Signed   By: Kathreen Devoid   On: 03/24/2019 13:02   Ct Cervical Spine Wo Contrast  Result Date: 03/24/2019 CLINICAL DATA:  Pt reports fell today at home, was ironing , stood up and fell while turning, hit face against table, obvious upper laceration, two broken teeth and obvious left fifth finger deformity. No loss of consciousness. Alert and oriented. EXAM: CT HEAD WITHOUT CONTRAST CT MAXILLOFACIAL WITHOUT CONTRAST CT CERVICAL SPINE WITHOUT CONTRAST TECHNIQUE: Multidetector CT imaging of the head, cervical spine, and  maxillofacial structures were performed using the standard protocol without intravenous contrast. Multiplanar CT image reconstructions of the cervical spine and maxillofacial structures were also generated. COMPARISON:  None. FINDINGS: CT HEAD FINDINGS Brain: No evidence of acute infarction, hemorrhage, extra-axial collection, ventriculomegaly, or mass effect. Small area of right frontal lobe low attenuation likely reflecting prior insult. Generalized cerebral atrophy. Periventricular white matter low attenuation likely secondary to microangiopathy. Vascular: Cerebrovascular atherosclerotic calcifications are noted. Skull: Negative for fracture or focal lesion. Sinuses/Orbits: Visualized portions of the orbits are unremarkable. Visualized portions of the paranasal sinuses and mastoid air cells are unremarkable. Other: None. CT MAXILLOFACIAL FINDINGS Osseous: No fracture or mandibular dislocation. No destructive process. Orbits: Negative. No traumatic or inflammatory finding. Sinuses: Paranasal sinuses are clear. Mastoid sinuses are clear. Rightward deviation of the nasal septum. Soft tissues: Negative. CT CERVICAL SPINE FINDINGS Alignment: Normal. Skull base and vertebrae: No acute fracture. No primary bone lesion or focal pathologic process. Soft tissues and spinal canal: No prevertebral fluid or swelling. No visible canal hematoma. Disc levels: Degenerative disease with disc height loss at C5-6, C7-T1 and T1-2 severe left and mild right facet arthropathy at C2-3. At C3-4 there is severe bilateral facet arthropathy. At C4-5 there is moderate bilateral facet arthropathy. At C5-6 there is a broad-based disc osteophyte complex, moderate bilateral facet arthropathy and bilateral foraminal stenosis. Upper chest: Hazy ground-glass opacity at the right lung apex. Other: No fluid collection or hematoma. Bilateral carotid artery atherosclerosis. IMPRESSION: 1. No acute intracranial pathology. 2.  No acute osseous injury of  the maxillofacial bones. 3.  No acute osseous injury of the cervical spine. 4. Cervical spine spondylosis as described above. Electronically Signed   By: Kathreen Devoid   On: 03/24/2019 13:02   Dg Shoulder Left  Result Date: 03/24/2019 CLINICAL DATA:  85 year old who fell today at home. Patient says that she was ironing, stood up and fell while turning, striking her face against a table. Patient currently anticoagulated on Plavix. Patient complains of neck pain, headache, BILATERAL shoulder pain, LEFT small finger pain, RIGHT knee pain and RIGHT foot pain. Initial encounter. EXAM: LEFT SHOULDER - 2+ VIEW COMPARISON:  None. FINDINGS: No evidence of acute fracture. Glenohumeral joint  anatomically aligned with mild joint space narrowing. Acromioclavicular joint anatomically aligned with mild degenerative changes. Mild osseous demineralization. IMPRESSION: 1. No acute osseous abnormality. 2. Mild degenerative changes involving the acromioclavicular and glenohumeral joints. Electronically Signed   By: Evangeline Dakin M.D.   On: 03/24/2019 12:54   Dg Knee Complete 4 Views Right  Result Date: 03/24/2019 CLINICAL DATA:  85 year old who fell today at home. Patient says that she was ironing, stood up and fell while turning, striking her face against a table. Patient currently anticoagulated on Plavix. Patient complains of neck pain, headache, BILATERAL shoulder pain, LEFT small finger pain, RIGHT knee pain and RIGHT foot pain. Initial encounter. EXAM: RIGHT KNEE - COMPLETE 4+ VIEW COMPARISON:  None. FINDINGS: Marked prepatellar soft tissue swelling/hematoma. No evidence of acute fracture or dislocation. Moderate MEDIAL and LATERAL compartment joint space narrowing. Mild patellofemoral compartment joint space narrowing. Chondrocalcinosis involving the LATERAL and MEDIAL menisci. Mild osseous demineralization. Small joint effusion. IMPRESSION: 1. No acute osseous abnormality. 2. Marked prepatellar soft tissue  swelling/hematoma. 3. 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 4. Small joint effusion. Electronically Signed   By: Evangeline Dakin M.D.   On: 03/24/2019 12:55   Dg Hand Complete Left  Result Date: 03/24/2019 CLINICAL DATA:  85 year old who fell today at home. Patient says that she was ironing, stood up and fell while turning, striking her face against a table. Patient currently anticoagulated on Plavix. Patient complains of neck pain, headache, BILATERAL shoulder pain, LEFT small finger pain, RIGHT knee pain and RIGHT foot pain. Initial encounter. EXAM: LEFT HAND - COMPLETE 3+ VIEW COMPARISON:  None. FINDINGS: Volar dislocation of the PIP joint of the small finger. Associated diffuse soft tissue swelling involving the small finger. No associated fracture is identified. Severe narrowing of the IP joint spaces of the fingers and thumb, most severe involving the DIP joint of the small, long and ring fingers. Moderate narrowing of the first MCP joint space. Remaining MCP joint spaces well-preserved. Severe narrowing of the trapezium-first metacarpal joint in the wrist. Cystic change involving the lunate bone of the wrist with associated sclerosis. Osseous demineralization. IMPRESSION: 1. Volar dislocation of the PIP joint of the small finger without associated fracture. 2. Severe osteoarthritis involving the IP joints of the fingers and thumb. 3. Severe osteoarthritis involving the trapezium-first metacarpal joint in the wrist. 4. Query osteonecrosis involving the lunate bone in the wrist. Electronically Signed   By: Evangeline Dakin M.D.   On: 03/24/2019 12:53   Dg Finger Little Left  Result Date: 03/24/2019 CLINICAL DATA:  Small finger dislocation postreduction. EXAM: LEFT LITTLE FINGER 2+V COMPARISON:  Radiographs earlier the same date. FINDINGS: There is no residual flexion at the PIP joint. There is an acute avulsion fracture involving the dorsal base of the middle  phalanx, best seen on the lateral view. This is mildly displaced. There are mild underlying degenerative changes at the PIP and DIP joints with a nonacute appearing osteophyte posterior to the DIP joint. There is diffuse soft tissue swelling throughout the small finger. IMPRESSION: Dorsal avulsion fracture from the base of 5th middle phalanx with interval realignment at the joint. Electronically Signed   By: Richardean Sale M.D.   On: 03/24/2019 15:42   Dg Foot Complete Right  Result Date: 03/24/2019 CLINICAL DATA:  85 year old who fell today at home. Patient says that she was ironing, stood up and fell while turning, striking her face against a table. Patient currently anticoagulated on Plavix. Patient complains of  neck pain, headache, BILATERAL shoulder pain, LEFT small finger pain, RIGHT knee pain and RIGHT foot pain. Initial encounter. EXAM: RIGHT FOOT COMPLETE - 3+ VIEW COMPARISON:  None. FINDINGS: No evidence of acute fracture or dislocation. Mild osseous demineralization. Mild-to-moderate narrowing of the IP joint spaces of the toes. Mild narrowing of the first MTP joint space. No associated erosions. Very small plantar calcaneal spur. IMPRESSION: 1. No acute osseous abnormality. 2. Mild to moderate osteoarthritis involving the IP joints of the toes. Mild osteoarthritis involving the first MTP joint. Electronically Signed   By: Evangeline Dakin M.D.   On: 03/24/2019 12:57   Ct Maxillofacial Wo Contrast  Result Date: 03/24/2019 CLINICAL DATA:  Pt reports fell today at home, was ironing , stood up and fell while turning, hit face against table, obvious upper laceration, two broken teeth and obvious left fifth finger deformity. No loss of consciousness. Alert and oriented. EXAM: CT HEAD WITHOUT CONTRAST CT MAXILLOFACIAL WITHOUT CONTRAST CT CERVICAL SPINE WITHOUT CONTRAST TECHNIQUE: Multidetector CT imaging of the head, cervical spine, and maxillofacial structures were performed using the standard  protocol without intravenous contrast. Multiplanar CT image reconstructions of the cervical spine and maxillofacial structures were also generated. COMPARISON:  None. FINDINGS: CT HEAD FINDINGS Brain: No evidence of acute infarction, hemorrhage, extra-axial collection, ventriculomegaly, or mass effect. Small area of right frontal lobe low attenuation likely reflecting prior insult. Generalized cerebral atrophy. Periventricular white matter low attenuation likely secondary to microangiopathy. Vascular: Cerebrovascular atherosclerotic calcifications are noted. Skull: Negative for fracture or focal lesion. Sinuses/Orbits: Visualized portions of the orbits are unremarkable. Visualized portions of the paranasal sinuses and mastoid air cells are unremarkable. Other: None. CT MAXILLOFACIAL FINDINGS Osseous: No fracture or mandibular dislocation. No destructive process. Orbits: Negative. No traumatic or inflammatory finding. Sinuses: Paranasal sinuses are clear. Mastoid sinuses are clear. Rightward deviation of the nasal septum. Soft tissues: Negative. CT CERVICAL SPINE FINDINGS Alignment: Normal. Skull base and vertebrae: No acute fracture. No primary bone lesion or focal pathologic process. Soft tissues and spinal canal: No prevertebral fluid or swelling. No visible canal hematoma. Disc levels: Degenerative disease with disc height loss at C5-6, C7-T1 and T1-2 severe left and mild right facet arthropathy at C2-3. At C3-4 there is severe bilateral facet arthropathy. At C4-5 there is moderate bilateral facet arthropathy. At C5-6 there is a broad-based disc osteophyte complex, moderate bilateral facet arthropathy and bilateral foraminal stenosis. Upper chest: Hazy ground-glass opacity at the right lung apex. Other: No fluid collection or hematoma. Bilateral carotid artery atherosclerosis. IMPRESSION: 1. No acute intracranial pathology. 2.  No acute osseous injury of the maxillofacial bones. 3.  No acute osseous injury of the  cervical spine. 4. Cervical spine spondylosis as described above. Electronically Signed   By: Kathreen Devoid   On: 03/24/2019 13:02    Procedures Procedures (including critical care time)  Medications Ordered in ED Medications  lidocaine (PF) (XYLOCAINE) 1 % injection 10 mL (10 mLs Intradermal Given 03/24/19 1403)  fentaNYL (SUBLIMAZE) injection 12.5 mcg (12.5 mcg Intravenous Given 03/24/19 1403)  Tdap (BOOSTRIX) injection 0.5 mL (0.5 mLs Intramuscular Given 03/24/19 1533)     Initial Impression / Assessment and Plan / ED Course  I have reviewed the triage vital signs and the nursing notes.  Pertinent labs & imaging results that were available during my care of the patient were reviewed by me and considered in my medical decision making (see chart for details).      85 yo F here w/ mechanical fall  and subsequent complex facial laceration as well as left fifth finger injury.  Re: laceration - wound cleansed. CT Head/C-Spine/Face fortunately w/o bony injury. Mild avulsion noted to right central incisor, no exposed pulp. Pt with significant bleeding from wound w/ hematoma formation likely due to DAPT use. Discussed with Dr. Wilburn Cornelia who has graciously accepted to [facility] for eval/possible OR or ED closure. Pt accepted in transfer by Dr. Alvino Chapel. She is NPO. Tetanus UTD.  Re: finger injury - superficial nail plate injury cleaned, no deep lac. Reduction performed following digital block and pt placed in splint. Dr. Grandville Silos consulted, appreciate recommendations. Following this discussion, splint reapplied with slight flexion (approx 30 deg) at PIP joint to promote healing w/o recurrent dislocation. Pt will be contacted by his office.  Re: fall - mechanical in nature. Family is aware and will monitor at home. Labs reassuring  Suspect that after lac is closed/repaired, pt could be safely d/c from medical standpoint with outpt Hand follow-up with Dr. Grandville Silos and f/u with ENT as per their  recommendations.  Final Clinical Impressions(s) / ED Diagnoses   Final diagnoses:  Fall, initial encounter  Lip laceration, initial encounter  Dislocation of finger PIP joint, initial encounter    ED Discharge Orders    None       Duffy Bruce, MD 03/24/19 1606    Duffy Bruce, MD 03/31/19 [phone]

## 2019-03-24 NOTE — Anesthesia Preprocedure Evaluation (Addendum)
Anesthesia Evaluation  Patient identified by MRN, date of birth, ID band Patient awake    Reviewed: Allergy & Precautions, NPO status , Patient's Chart, lab work & pertinent test results  Airway Mallampati: II       Dental  (+) Loose, Dental Advisory Given,    Pulmonary    breath sounds clear to auscultation       Cardiovascular hypertension,  Rhythm:Regular Rate:Normal     Neuro/Psych    GI/Hepatic   Endo/Other    Renal/GU      Musculoskeletal   Abdominal   Peds  Hematology   Anesthesia Other Findings   Reproductive/Obstetrics                            Anesthesia Physical Anesthesia Plan  ASA: III and emergent  Anesthesia Plan: General   Post-op Pain Management:    Induction: Intravenous  PONV Risk Score and Plan: Ondansetron and Dexamethasone  Airway Management Planned: Oral ETT  Additional Equipment:   Intra-op Plan:   Post-operative Plan: Extubation in OR  Informed Consent: I have reviewed the patients History and Physical, chart, labs and discussed the procedure including the risks, benefits and alternatives for the proposed anesthesia with the patient or authorized representative who has indicated his/her understanding and acceptance.     Dental advisory given  Plan Discussed with: CRNA and Anesthesiologist  Anesthesia Plan Comments:         Anesthesia Quick Evaluation

## 2019-03-24 NOTE — Op Note (Signed)
Operative Note: LIP LACERATION  Patient: Kristin Shaffer  Medical record number: [id number]  Date:03/24/2019  Pre-operative Indications: Complex upper lip laceration  Postoperative Indications: Same  Surgical Procedure: Reconstruction 5 cm complex upper lip laceration  Anesthesia: GET  Surgeon: Delsa Bern, M.D.  Assist: None  Complications: None  EBL: Less than 50 cc   Brief History: The patient is a 85 y.o. female with a history of lip laceration.  The patient was seen at the [facility] after suffering a fall where she struck her face, her upper body and wrists.  She had a complex 5 cm laceration through and through the upper lip with a partial avulsion of her right central upper incisor.  No other facial trauma or fractures on scan.. Given the patient's history and findings I recommended examination, debridement and reconstruction of complex upper lip laceration under general anesthesia, risks and benefits were discussed in detail with the patient and their family. They understand and agree with our plan for surgery which is scheduled at [facility] on an emergency basis.  Surgical Procedure: The patient is brought to the operating room on 03/24/2019 and placed in supine position on the operating table. General endotracheal anesthesia was established without difficulty. When the patient was adequately anesthetized, surgical timeout was performed and correct identification of the patient and the surgical procedure. The patient was positioned and prepped and draped in sterile fashion.  The patient had a 5 cm through and through complex upper lip laceration.  This was examined and then cleaned with half-strength hydrogen peroxide solution followed by Betadine.  The wound had several areas of tissue necrosis at the margin of the lip and there was an avulsed portion of lip tissue.  The necrotic tissue was debrided and the avulsed portion of the lip was reconstructed.   Beginning with the mucosal aspect of the laceration this was closed with interrupted 4-0 Chromic Gut suture which was then extended along the margin of the lip.  The orbicularis oris musculature layer was then reconstructed with interrupted 4-0 Vicryl suture.  The superior external component of the laceration was then closed with interrupted 5-0 Ethilon suture.  The mucosal surface of the lip was then closed with interrupted chromic gut suture.  Patient's wound was dressed with bacitracin ointment.  An orogastric tube was passed and stomach contents were aspirated. Patient was awakened from anesthetic and transferred from the operating room to the recovery room in stable condition. There were no complications and blood loss was minimal.   Delsa Bern, M.D. [facility] 03/24/2019

## 2019-03-24 NOTE — ED Notes (Signed)
[facility] arrived to transport pt to [facility] 

## 2019-03-24 NOTE — ED Triage Notes (Addendum)
Pt reports fell today at home, was ironing , stood up and  fell while turning, hit face against table, obvious upper laceration, two broken teeth and obvious left fifth finger deformity. Denies loc, alert and oriented x 4. On Plavix

## 2019-03-24 NOTE — ED Notes (Signed)
ED Provider at bedside. 

## 2019-03-24 NOTE — Transfer of Care (Signed)
Immediate Anesthesia Transfer of Care Note  Patient: Kristin Shaffer  Procedure(s) Performed: COMPLEX FACIAL LACERATIONS (Right Face)  Patient Location: PACU  Anesthesia Type:General  Level of Consciousness: drowsy  Airway & Oxygen Therapy: Patient Spontanous Breathing and Patient connected to face mask oxygen  Post-op Assessment: Report given to RN and Post -op Vital signs reviewed and unstable, Anesthesiologist notified  Post vital signs: Reviewed  Last Vitals:  Vitals Value Taken Time  BP 184/69   Temp    Pulse 66 03/24/2019  6:34 PM  Resp 17 03/24/2019  6:34 PM  SpO2 96 % 03/24/2019  6:34 PM  Vitals shown include unvalidated device data.  Last Pain:  Vitals:   03/24/19 1147  TempSrc:   PainSc: 6          Complications: No apparent anesthesia complications

## 2019-03-25 ENCOUNTER — Encounter (HOSPITAL_COMMUNITY): Payer: Self-pay | Admitting: Otolaryngology

## 2019-03-25 NOTE — Anesthesia Postprocedure Evaluation (Signed)
Anesthesia Post Note  Patient: Kristin Shaffer  Procedure(s) Performed: COMPLEX FACIAL LACERATIONS (Right Face)     Patient location during evaluation: PACU Anesthesia Type: General Level of consciousness: sedated and patient cooperative Pain management: pain level controlled Vital Signs Assessment: post-procedure vital signs reviewed and stable Respiratory status: spontaneous breathing Cardiovascular status: stable Anesthetic complications: no    Last Vitals:  Vitals:   03/24/19 1930 03/24/19 1945  BP: (!) 172/62 (!) 173/65  Pulse: 68 68  Resp: 16 19  Temp:  (!) 36.3 C  SpO2: 90% 95%    Last Pain:  Vitals:   03/24/19 1930  TempSrc:   PainSc: 0-No pain                 Nolon Nations

## 2019-03-28 ENCOUNTER — Other Ambulatory Visit: Payer: Self-pay | Admitting: Internal Medicine

## 2019-03-31 ENCOUNTER — Other Ambulatory Visit: Payer: Self-pay

## 2019-03-31 MED ORDER — NITROGLYCERIN 0.4 MG SL SUBL: 0.4000 mg | SUBLINGUAL_TABLET | SUBLINGUAL | 6 refills | Status: DC | PRN

## 2019-04-03 ENCOUNTER — Other Ambulatory Visit: Payer: Self-pay

## 2019-04-03 MED ORDER — NITROGLYCERIN 0.4 MG SL SUBL: 0.4000 mg | SUBLINGUAL_TABLET | SUBLINGUAL | 6 refills | Status: DC | PRN

## 2019-04-09 ENCOUNTER — Other Ambulatory Visit: Payer: Self-pay

## 2019-04-09 MED ORDER — NITROGLYCERIN 0.4 MG SL SUBL: 0.4000 mg | SUBLINGUAL_TABLET | SUBLINGUAL | 6 refills | Status: DC | PRN

## 2019-04-23 ENCOUNTER — Telehealth (HOSPITAL_COMMUNITY): Payer: Self-pay

## 2019-04-23 NOTE — Telephone Encounter (Signed)
04/23/19 Called patient to schedule Aorta/iliac she refused to have done feels it is not needed. Message sent to Dr.Hilty's nurse.

## 2019-05-22 ENCOUNTER — Other Ambulatory Visit: Payer: Self-pay | Admitting: Internal Medicine

## 2019-06-26 ENCOUNTER — Ambulatory Visit: Payer: Medicare Other | Admitting: Internal Medicine

## 2019-07-04 ENCOUNTER — Encounter (HOSPITAL_COMMUNITY): Payer: Medicare Other

## 2019-08-15 ENCOUNTER — Telehealth: Payer: Self-pay | Admitting: Pulmonary Disease

## 2019-08-15 NOTE — Telephone Encounter (Signed)
Returned call to who needs to schedule with a new provider former [facility] patient.  Pt needs Friday afternoon in Oct and PFT with appt. Pt does not drive so will need to arrange transportation.

## 2019-08-15 NOTE — Telephone Encounter (Signed)
Offered to schedule PFT on one Friday and the ov on next Friday. Pt declined she would like both on same Friday. Made aware recall would be placed and when she gets letter call office back to schedule. Nothing further needed.

## 2019-08-29 ENCOUNTER — Ambulatory Visit: Payer: Medicare Other | Admitting: Internal Medicine

## 2019-09-12 ENCOUNTER — Other Ambulatory Visit: Payer: Self-pay | Admitting: Internal Medicine

## 2019-09-30 ENCOUNTER — Other Ambulatory Visit: Payer: Self-pay | Admitting: Internal Medicine

## 2019-10-03 ENCOUNTER — Encounter: Payer: Self-pay | Admitting: Internal Medicine

## 2019-10-03 ENCOUNTER — Ambulatory Visit (INDEPENDENT_AMBULATORY_CARE_PROVIDER_SITE_OTHER): Payer: Medicare Other | Admitting: Internal Medicine

## 2019-10-03 ENCOUNTER — Other Ambulatory Visit: Payer: Self-pay

## 2019-10-03 VITALS — BP 140/64 | HR 58 | Temp 96.3°F | Ht 65.0 in | Wt 187.0 lb

## 2019-10-03 DIAGNOSIS — I2510 Atherosclerotic heart disease of native coronary artery without angina pectoris: Secondary | ICD-10-CM

## 2019-10-03 DIAGNOSIS — Z79899 Other long term (current) drug therapy: Secondary | ICD-10-CM

## 2019-10-03 DIAGNOSIS — E782 Mixed hyperlipidemia: Secondary | ICD-10-CM | POA: Diagnosis not present

## 2019-10-03 DIAGNOSIS — E039 Hypothyroidism, unspecified: Secondary | ICD-10-CM

## 2019-10-03 LAB — COMPREHENSIVE METABOLIC PANEL
ALT: 38 IU/L — ABNORMAL HIGH (ref 0–32)
AST: 70 IU/L — ABNORMAL HIGH (ref 0–40)
Albumin/Globulin Ratio: 1.7 (ref 1.2–2.2)
Albumin: 4.3 g/dL (ref 3.6–4.6)
Alkaline Phosphatase: 136 IU/L — ABNORMAL HIGH (ref 39–117)
BUN/Creatinine Ratio: 18 (ref 12–28)
BUN: 17 mg/dL (ref 8–27)
Bilirubin Total: 0.8 mg/dL (ref 0.0–1.2)
CO2: 26 mmol/L (ref 20–29)
Calcium: 10.3 mg/dL (ref 8.7–10.3)
Chloride: 100 mmol/L (ref 96–106)
Creatinine, Ser: 0.92 mg/dL (ref 0.57–1.00)
GFR calc Af Amer: 65 mL/min/{1.73_m2} (ref >59)
GFR calc non Af Amer: 57 mL/min/{1.73_m2} — ABNORMAL LOW (ref >59)
Globulin, Total: 2.5 g/dL (ref 1.5–4.5)
Glucose: 95 mg/dL (ref 65–99)
Potassium: 4.7 mmol/L (ref 3.5–5.2)
Sodium: 142 mmol/L (ref 134–144)
Total Protein: 6.8 g/dL (ref 6.0–8.5)

## 2019-10-03 LAB — LIPID PANEL
Chol/HDL Ratio: 2.4 ratio (ref 0.0–4.4)
Cholesterol, Total: 172 mg/dL (ref 100–199)
HDL: 73 mg/dL (ref >39)
LDL Chol Calc (NIH): 78 mg/dL (ref 0–99)
Triglycerides: 118 mg/dL (ref 0–149)
VLDL Cholesterol Cal: 21 mg/dL (ref 5–40)

## 2019-10-03 LAB — T4, FREE: Free T4: 1.23 ng/dL (ref 0.82–1.77)

## 2019-10-03 LAB — TSH: TSH: 7.63 u[IU]/mL — ABNORMAL HIGH (ref 0.450–4.500)

## 2019-10-03 NOTE — Patient Instructions (Signed)
Medication Instructions:  Your physician recommends that you continue on your current medications as directed. Please refer to the Current Medication list given to you today.  *If you need a refill on your cardiac medications before your next appointment, please call your pharmacy*  Lab Work: CMET, TSH, Free T4, lipid panel today If you have labs (blood work) drawn today and your tests are completely normal, you will receive your results only by: Marland Kitchen MyChart Message (if you have MyChart) OR . A paper copy in the mail If you have any lab test that is abnormal or we need to change your treatment, we will call you to review the results.  Testing/Procedures: NONE  Follow-Up: At [facility], you and your health needs are our priority.  As part of our continuing mission to provide you with exceptional heart care, we have created designated Provider Care Teams.  These Care Teams include your primary Cardiologist (physician) and Advanced Practice Providers (APPs -  Physician Assistants and Nurse Practitioners) who all work together to provide you with the care you need, when you need it.  Your next appointment:   12 months  The format for your next appointment:   In Person  Provider:   You may see Pixie Casino, MD or one of the following Advanced Practice Providers on your designated Care Team:    Almyra Deforest, PA-C  Fabian Sharp, PA-C or   Roby Lofts, [place]   Other Instructions

## 2019-10-03 NOTE — Progress Notes (Signed)
OFFICE NOTE  Chief Complaint:  Routine follow-up  Primary Care Physician: Nickola Major, MD  HPI:  Kristin Shaffer is an 85 year old Caucasian female with a history of hyperlipidemia and hypothyroidism. She was seen by myself on March 20, 2013, and at that time was found to be in hypertensive urgency with a blood pressure of 230/94. She was admitted to [facility] for blood pressure control and cardiac catheterization for unstable angina. She did in fact have a non-ST-elevation myocardial infarction with a mildly elevated troponin of 0.67. She underwent coronary angiography, which revealed a subtotal occlusion of the mid right coronary artery. She then underwent a successful complex PCI of the entire mid RCA encompassing a long tubular 40% to 60% lesion that preceded 2 tandem 95% and 99% subtotal occlusions. This was completed using 3 overlapping Xience Xpedition drug-eluting stents. She was placed on aspirin and Brilinta, and was discharged. She subsequently returned to [facility] on April 26 to the emergency room with chest pain. Patient states that it was a "sharp, stabbing, and burning" pain. It was 10 out of 10 in intensity. She took 4 baby aspirin at home, but did not have any nitroglycerin. EMS gave her one, which provided her some relief, and midway to the ER, they gave her another one, which essentially resolved the pain. Patient reports feeling weak, but that is improving compared to when she was discharged. She has had no chest pain since the episode on the 26th. She was provided with a prescription at the ER for sublingual nitroglycerin. She did undergo an echocardiogram which showed preserved LV systolic function and an EF of 60-65%. There was stage I diastolic dysfunction and aortic sclerosis with mild central aortic regurgitation. Otherwise no significant abnormalities.  She also had carotid and renal Dopplers. The carotid Dopplers indicated a small amount of bilateral  plaque. The renal Dopplers did not indicate any renal artery stenosis.  Recently she had lipid profile performed which showed a particle number of 1783, with LDL C. of 143.  Based on this I recommended increasing her Lipitor from 20-40 mg.  She did make this change, but as noted pain and weakness in her joints, especially when she wakes up in the morning which takes about 30 minutes to improve. This sounds a lot like arthritis, but she really feels it is related to her cholesterol medicine.  Overall, at her last office visit I felt she was doing fairly well. Unfortunately about 5 days after that visit she started to have acute onset chest discomfort with radiation of pain into her left arm. She presented to the emergency department and was found to have unstable angina. Cardiac catheterization was performed which demonstrated 99% in-stent stenosis at the previously placed mid RCA stent.  I am interested as to why she presented with acute unstable angina, and not progressive anginal symptoms with exertion. She certainly had absolutely no chest pain or arm pain just a few days prior to the presentation, which is more consistent with an acute coronary syndrome.  Kristin Shaffer returns today for follow-up. She reports that she has had to take nitroglycerin twice for chest discomfort. Wants while riding in a car and another time at rest. Both episodes sound more like reflux however she did have improvement after about 15 minutes. She recently is been having problems with bruising on aspirin and Effient and is concerned about being on strong blood thinners. Her last stent placement was in 11/2013. Dual antiplatelet therapy  was recommended for at least a year.  I saw Kristin Shaffer back in the office today. She is complaining of some weakness and fatigue. She also soreness in her muscles, particularly when she gets up in the morning. Some of it seems to be joint soreness which is concerning for osteoarthritis. She clearly  has signs of osteoarthritis in her DIP joints and there is some crepitus in her knees. It is however possible that some of her symptoms could be related to Lipitor. She is on combination Lipitor and fenofibrate, which is slightly higher risk of developing myalgias. Unfortunately, her cholesterol has been really well controlled.   Kristin Shaffer returns today for follow-up. She recently was planting some irises in her lawn and is complaining of some back pain and spasm. In the past she had some relief from Flexeril, however does not currently have any. She denies any cardiac chest pain. She does get some occasional reflux symptoms associated with certain foods.  Kristin Shaffer see him back in the office again today. She has some intermittent chest discomfort. The symptoms seem atypical and are not like the chest and left arm pain she had in the past. She recently saw her primary care provider who noted some possible fluid in the right lung base. I do hear some faint crackles today however it sounds to me more like atelectasis or even fibrosis. She reports some shortness of breath. She also thinks she may be having reflux symptoms but is hesitant to take omeprazole because she says that she feels that could cause her chest pain. I reassured her that it would not.  04/14/2016  Kristin Shaffer was seen back today in follow-up for review of her echocardiogram. This is essentially unchanged compared to her prior study in 2014. Her pulmonary pressure is elevated at 45 mmHg. LVEF is 60-65%. There is mild diastolic dysfunction. Although her primary pressure is elevated, this is not likely a significant cause of her shortness of breath given the mild elevation. I am concerned however that she may have underlying pulmonary fibrosis. There is evidence of this by chest x-ray in 2015 and she has some dry crackles on exam. I do not believe there is interstitial edema. She is also complaining now of right flank pain. She has some CVA  tenderness but has had no fever, chills or shaking chills. She recently had 2 UTIs apparently which eventually improved with Cipro. She denies any chest pain.  05/22/2016  Mrs. Forry returns today for follow-up. She underwent CT scan of the chest due to some interstitial changes on her chest x-ray and right flank/lower right posterior chest pain. She reports that this chest pain has improved and resolved although she still notes some shortness of breath with exertion. The CT scan shows a pattern of rather widespread groundglass attenuation most evident in the mid to lower lung fields. There are patchy areas of mild subpleural reticulation noted as well. This is concerning for either non-specific interstitial pneumonia or possibly IPF. Based on these findings, I would recommend a pulmonary evaluation. There is no evidence of a pneumonia. Her BNP is low at 40 and I did not suspect this is congestive heart failure or pulmonary edema. Finally she underwent abdominal ultrasound which demonstrated moderate aortoiliac atherosclerosis which will need follow-up annually.  01/15/2018  Mrs. Lutzke returns today for follow-up. She's recently had a number of medical problems include a hospitalization for intracerebral hemorrhage. This was a result of trauma falling down stairs. Fortunately this resolved completely.  She did see Dr. Curt Jews for possible nonspecific interstitial pneumonia but he feels that her radiographic findings an abnormal PFTs may be attributable to chronic aspiration with GERD. She does report shortness of breath with moderate exertion. Blood pressure today was initially elevated 188/71 however recheck was 120/68. She was switched from lisinopril to losartan for persistent cough however is noted no change. She is on over-the-counter H2 blocker for her reflux.  09/28/2017  Arliene was seen today in follow-up.  Overall she seems to be doing well.  She saw Dr. Lake Bells for pulmonary fibrosis.  He is  thought that this may be related to prior pesticide exposure or silent aspiration.  He does not feel like he has changed significantly and may not be IPF.  She denies any recurrent chest pain.  She is recovered from her intracerebral hemorrhage.  She is now back on aspirin and Plavix.  She has not had recent lipid testing.  She is n.p.o. today.  Blood pressure is top normal 140/78.  08/29/2018  Shaquela is seen today in follow-up.  Overall she seems to be doing well.  She had seen Dr. Lake Bells for pulmonary fibrosis and he felt that she did not need any further therapies but wanted to follow-up with her.  She does not have an appointment I encouraged her to reach out.  She denies any chest pain.  Blood pressure is well controlled today 122/64.  EKG shows sinus rhythm.  10/03/2019  Kamyiah returns today for follow-up.  In general she feels well.  She denies any worsening shortness of breath or chest pain.  Blood pressure was initially elevated today however came down to 140/64.  She had lipid testing last year which showed total cholesterol 171, triglycerides 144, HDL 65 and LDL 77.  Her target LDL is less than 70.  She is on dual antiplatelet therapy with aspirin and Plavix.  She also takes levothyroxine and has not had a recheck recently.  She is inquiring about getting lab work today.  EKG shows sinus rhythm at 60.  PMHx:  Past Medical History:  Diagnosis Date  . Anxiety   . Arthritis    "different places; not bad" (03/20/2013)  . Coronary artery disease   . Exertional shortness of breath   . GERD (gastroesophageal reflux disease)   . High cholesterol   . Hypertension   . Hypothyroidism   . Myocardial infarction [facility])    "Dr's saw evidence I might have had a heart attack" (03/20/2013)  . NSTEMI (non-ST elevated myocardial infarction) ([place]) 03/20/2013   cath - mid RCA  . Pneumonia 2012  . Skin cancer    "both legs; right arm" (03/20/2013)    Past Surgical History:  Procedure Laterality Date   . APPENDECTOMY  2003  . BREAST BIOPSY Bilateral 1990s   "total of 5; 2 on one side, 3 on the other; all benign" (03/20/2013)  . CATARACT EXTRACTION W/ INTRAOCULAR LENS  IMPLANT, BILATERAL Bilateral ~ 2008  . CORONARY ANGIOPLASTY  12/08/2013  . CORONARY ANGIOPLASTY WITH STENT PLACEMENT  03/20/2013   NSTEMI - subtotal occlusion of mid RCA - PCI of mid RCA of long tubular 40-60% lesion - 2 tandem 95-99% subtotal occlusions - 3 overlapping Xience Xpedition DES (Dr. Roni Bread)   . [place] OF UTERUS  1956?  Marland Kitchen LACERATION REPAIR Right 03/24/2019   Procedure: COMPLEX FACIAL LACERATIONS;  Surgeon: Jerrell Belfast, MD;  Location: [place];  Service: ENT;  Laterality: Right;  . LEFT HEART CATHETERIZATION  WITH CORONARY ANGIOGRAM N/A 03/20/2013   Procedure: LEFT HEART CATHETERIZATION WITH CORONARY ANGIOGRAM;  Surgeon: Sanda Klein, MD;  Location: [place] CATH LAB;  Service: Cardiovascular;  Laterality: N/A;  . LEFT HEART CATHETERIZATION WITH CORONARY ANGIOGRAM N/A 12/08/2013   Procedure: LEFT HEART CATHETERIZATION WITH CORONARY ANGIOGRAM;  Surgeon: Blane Ohara, MD;  Location: [facility] C[facility] LAB;  Service: Cardiovascular;  Laterality: N/A;  . SKIN CANCER EXCISION     "1 off right arm; 2 off each leg" (03/20/2013)  . TRANSTHORACIC ECHOCARDIOGRAM  03/2013   EF [id number], grade 1 diastolic dysfunction; mild MR; calcifed MV annulus; LA mildly dilated    FAMHx:  Family History  Problem Relation Age of Onset  . Dementia Mother   . Lung cancer Father   . CAD Father 59  . Cancer Brother     SOCHx:   reports that she has never smoked. She has never used smokeless tobacco. She reports that she does not drink alcohol or use drugs.  ALLERGIES:  Allergies  Allergen Reactions  . Hydrocodone-Acetaminophen Nausea And Vomiting  . Omeprazole Other (See Comments)    Made acid reflux worse  . Zocor [Simvastatin] Other (See Comments)    Trouble Walking, Leg weakness and cramping  . Latex Hives and Rash     ROS: Pertinent items noted in HPI and remainder of comprehensive ROS otherwise negative.  HOME MEDS: Current Outpatient Medications  Medication Sig Dispense Refill  . aspirin EC 81 MG tablet Take 81 mg by mouth daily.    Marland Kitchen atorvastatin (LIPITOR) 20 MG tablet TAKE 1 TABLET EVERY DAY 90 tablet 0  . CALCIUM-VITAMIN D PO Take 1 tablet by mouth daily.    . clopidogrel (PLAVIX) 75 MG tablet TAKE 1 TABLET EVERY DAY 90 tablet 3  . ezetimibe (ZETIA) 10 MG tablet TAKE 1/2 TABLET EVERY DAY 45 tablet 0  . levothyroxine (SYNTHROID, LEVOTHROID) 100 MCG tablet Take 100 mcg by mouth daily before breakfast.    . losartan (COZAAR) 50 MG tablet TAKE 1 TABLET EVERY DAY 90 tablet 3  . Magnesium Oxide 250 MG TABS Take 1 tablet by mouth daily.    . metoprolol tartrate (LOPRESSOR) 25 MG tablet Take 0.5 tablets (12.5 mg total) by mouth 2 (two) times daily. (Patient taking differently: Take 25 mg by mouth 2 (two) times daily. Take one tablet in the morning and one tablet at night) 90 tablet 3  . Multiple Vitamins-Minerals (MULTIVITAMIN ADULT PO) Take by mouth.    . nitroGLYCERIN (NITROSTAT) 0.4 MG SL tablet Place 1 tablet (0.4 mg total) under the tongue every 5 (five) minutes as needed for chest pain. 25 tablet 6  . pantoprazole (PROTONIX) 20 MG tablet Take by mouth.     No current facility-administered medications for this visit.     LABS/IMAGING: No results found for this or any previous visit (from the past 48 hour(s)). No results found.  VITALS: BP (!) 152/70   Pulse (!) 58   Temp (!) 96.3 F (35.7 C)   Ht 5\' 5"  (1.651 m)   Wt 187 lb (84.8 kg)   SpO2 96%   BMI 31.12 kg/m   EXAM: General appearance: alert and no distress Neck: no carotid bruit and no JVD Lungs: clear to auscultation bilaterally Heart: regular rate and rhythm, S1, S2 normal, no murmur, click, rub or gallop Abdomen: soft, non-tender; bowel sounds normal; no masses,  no organomegaly Extremities: extremities normal, atraumatic,  no cyanosis or edema Pulses: 2+ and symmetric Skin: Skin color, texture,  turgor normal. No rashes or lesions Neurologic: Grossly normal  EKG: Normal sinus rhythm 60-personally reviewed  ASSESSMENT: 1. Coronary artery disease status post recent and STEMI, PCI to the RCA with 4 overlapping Xience drug-eluting stents - now with recent UA and ISR (2015) 2. Hypertension 3. Hypothyroidism 4. GERD 5. Dyslipidemia - now on lipitor, zetia and fenofibrate 6. Low back spasm 7. Progressive DOE - possible pulmonary fibrosis on CT 8. Pulmonary hypertension  9. Right flank pain 10. Moderate aorto-iliac atherosclerosis 11. Recent traumatic ICH  PLAN: 1.   Mrs. Bullis continues to do well without any chest pain or worsening shortness of breath.  Her cholesterols been reasonably well-controlled last year but not at target less than 70.  I like to recheck that today as well as a metabolic profile, TSH and free T4 per her request.  She does have a coronary disease history but is not having any unstable angina symptoms.  She has had some instability with walking and falls but no further bleeding or traumatic bleeding that she had in the past.  Follow-up with me annually or sooner as necessary.  Pixie Casino, MD, [facility], [place] Director of the [facility] Lipid Disorders &  Cardiovascular Risk Reduction Clinic Diplomate of the American Board of Clinical Lipidology Attending Cardiologist  Direct Dial: [phone]  Fax: [phone]  Website:  [email or web address] Steffek 10/03/2019, 10:06 AM

## 2019-12-02 ENCOUNTER — Other Ambulatory Visit (HOSPITAL_COMMUNITY): Payer: Medicare Other

## 2019-12-05 ENCOUNTER — Ambulatory Visit: Payer: Medicare Other | Admitting: Primary Care

## 2019-12-25 ENCOUNTER — Other Ambulatory Visit: Payer: Self-pay | Admitting: Internal Medicine

## 2020-01-03 ENCOUNTER — Other Ambulatory Visit: Payer: Self-pay | Admitting: Internal Medicine

## 2020-01-05 NOTE — Telephone Encounter (Signed)
Rx has been sent to the pharmacy electronically. ° °

## 2020-03-24 ENCOUNTER — Other Ambulatory Visit: Payer: Self-pay | Admitting: Internal Medicine

## 2020-06-07 ENCOUNTER — Other Ambulatory Visit: Payer: Self-pay | Admitting: Internal Medicine

## 2020-07-14 ENCOUNTER — Other Ambulatory Visit: Payer: Self-pay | Admitting: Internal Medicine

## 2020-10-20 ENCOUNTER — Other Ambulatory Visit: Payer: Self-pay | Admitting: Internal Medicine

## 2020-12-17 ENCOUNTER — Ambulatory Visit: Payer: Medicare Other | Admitting: Pulmonary Disease

## 2020-12-27 ENCOUNTER — Encounter: Payer: Self-pay | Admitting: Internal Medicine

## 2020-12-27 ENCOUNTER — Other Ambulatory Visit: Payer: Self-pay

## 2020-12-27 ENCOUNTER — Ambulatory Visit (INDEPENDENT_AMBULATORY_CARE_PROVIDER_SITE_OTHER): Payer: Medicare Other | Admitting: Internal Medicine

## 2020-12-27 VITALS — BP 124/64 | HR 60 | Ht 64.0 in | Wt 182.6 lb

## 2020-12-27 DIAGNOSIS — I2510 Atherosclerotic heart disease of native coronary artery without angina pectoris: Secondary | ICD-10-CM | POA: Diagnosis not present

## 2020-12-27 DIAGNOSIS — I739 Peripheral vascular disease, unspecified: Secondary | ICD-10-CM

## 2020-12-27 DIAGNOSIS — E039 Hypothyroidism, unspecified: Secondary | ICD-10-CM

## 2020-12-27 DIAGNOSIS — E782 Mixed hyperlipidemia: Secondary | ICD-10-CM | POA: Diagnosis not present

## 2020-12-27 NOTE — Progress Notes (Signed)
OFFICE NOTE  Chief Complaint:  Routine follow-up  Primary Care Physician: Nickola Major, MD  HPI:  Kristin Shaffer is an 87 year old Caucasian female with a history of hyperlipidemia and hypothyroidism. She was seen by myself on March 20, 2013, and at that time was found to be in hypertensive urgency with a blood pressure of 230/94. She was admitted to [facility] for blood pressure control and cardiac catheterization for unstable angina. She did in fact have a non-ST-elevation myocardial infarction with a mildly elevated troponin of 0.67. She underwent coronary angiography, which revealed a subtotal occlusion of the mid right coronary artery. She then underwent a successful complex PCI of the entire mid RCA encompassing a long tubular 40% to 60% lesion that preceded 2 tandem 95% and 99% subtotal occlusions. This was completed using 3 overlapping Xience Xpedition drug-eluting stents. She was placed on aspirin and Brilinta, and was discharged. She subsequently returned to [facility] on April 26 to the emergency room with chest pain. Patient states that it was a "sharp, stabbing, and burning" pain. It was 10 out of 10 in intensity. She took 4 baby aspirin at home, but did not have any nitroglycerin. EMS gave her one, which provided her some relief, and midway to the ER, they gave her another one, which essentially resolved the pain. Patient reports feeling weak, but that is improving compared to when she was discharged. She has had no chest pain since the episode on the 26th. She was provided with a prescription at the ER for sublingual nitroglycerin. She did undergo an echocardiogram which showed preserved LV systolic function and an EF of 60-65%. There was stage I diastolic dysfunction and aortic sclerosis with mild central aortic regurgitation. Otherwise no significant abnormalities.  She also had carotid and renal Dopplers. The carotid Dopplers indicated a small amount of bilateral  plaque. The renal Dopplers did not indicate any renal artery stenosis.  Recently she had lipid profile performed which showed a particle number of 1783, with LDL C. of 143.  Based on this I recommended increasing her Lipitor from 20-40 mg.  She did make this change, but as noted pain and weakness in her joints, especially when she wakes up in the morning which takes about 30 minutes to improve. This sounds a lot like arthritis, but she really feels it is related to her cholesterol medicine.  Overall, at her last office visit I felt she was doing fairly well. Unfortunately about 5 days after that visit she started to have acute onset chest discomfort with radiation of pain into her left arm. She presented to the emergency department and was found to have unstable angina. Cardiac catheterization was performed which demonstrated 99% in-stent stenosis at the previously placed mid RCA stent.  I am interested as to why she presented with acute unstable angina, and not progressive anginal symptoms with exertion. She certainly had absolutely no chest pain or arm pain just a few days prior to the presentation, which is more consistent with an acute coronary syndrome.  Kristin Shaffer returns today for follow-up. She reports that she has had to take nitroglycerin twice for chest discomfort. Wants while riding in a car and another time at rest. Both episodes sound more like reflux however she did have improvement after about 15 minutes. She recently is been having problems with bruising on aspirin and Effient and is concerned about being on strong blood thinners. Her last stent placement was in 11/2013. Dual antiplatelet therapy  was recommended for at least a year.  I saw Kristin Shaffer back in the office today. She is complaining of some weakness and fatigue. She also soreness in her muscles, particularly when she gets up in the morning. Some of it seems to be joint soreness which is concerning for osteoarthritis. She clearly  has signs of osteoarthritis in her DIP joints and there is some crepitus in her knees. It is however possible that some of her symptoms could be related to Lipitor. She is on combination Lipitor and fenofibrate, which is slightly higher risk of developing myalgias. Unfortunately, her cholesterol has been really well controlled.   Kristin Shaffer returns today for follow-up. She recently was planting some irises in her lawn and is complaining of some back pain and spasm. In the past she had some relief from Flexeril, however does not currently have any. She denies any cardiac chest pain. She does get some occasional reflux symptoms associated with certain foods.  Kristin Shaffer see him back in the office again today. She has some intermittent chest discomfort. The symptoms seem atypical and are not like the chest and left arm pain she had in the past. She recently saw her primary care provider who noted some possible fluid in the right lung base. I do hear some faint crackles today however it sounds to me more like atelectasis or even fibrosis. She reports some shortness of breath. She also thinks she may be having reflux symptoms but is hesitant to take omeprazole because she says that she feels that could cause her chest pain. I reassured her that it would not.  04/14/2016  Kristin Shaffer was seen back today in follow-up for review of her echocardiogram. This is essentially unchanged compared to her prior study in 2014. Her pulmonary pressure is elevated at 45 mmHg. LVEF is 60-65%. There is mild diastolic dysfunction. Although her primary pressure is elevated, this is not likely a significant cause of her shortness of breath given the mild elevation. I am concerned however that she may have underlying pulmonary fibrosis. There is evidence of this by chest x-ray in 2015 and she has some dry crackles on exam. I do not believe there is interstitial edema. She is also complaining now of right flank pain. She has some CVA  tenderness but has had no fever, chills or shaking chills. She recently had 2 UTIs apparently which eventually improved with Cipro. She denies any chest pain.  05/22/2016  Kristin Shaffer returns today for follow-up. She underwent CT scan of the chest due to some interstitial changes on her chest x-ray and right flank/lower right posterior chest pain. She reports that this chest pain has improved and resolved although she still notes some shortness of breath with exertion. The CT scan shows a pattern of rather widespread groundglass attenuation most evident in the mid to lower lung fields. There are patchy areas of mild subpleural reticulation noted as well. This is concerning for either non-specific interstitial pneumonia or possibly IPF. Based on these findings, I would recommend a pulmonary evaluation. There is no evidence of a pneumonia. Her BNP is low at 40 and I did not suspect this is congestive heart failure or pulmonary edema. Finally she underwent abdominal ultrasound which demonstrated moderate aortoiliac atherosclerosis which will need follow-up annually.  01/15/2018  Kristin Shaffer returns today for follow-up. She's recently had a number of medical problems include a hospitalization for intracerebral hemorrhage. This was a result of trauma falling down stairs. Fortunately this resolved completely.  She did see Dr. Curt Jews for possible nonspecific interstitial pneumonia but he feels that her radiographic findings an abnormal PFTs may be attributable to chronic aspiration with GERD. She does report shortness of breath with moderate exertion. Blood pressure today was initially elevated 188/71 however recheck was 120/68. She was switched from lisinopril to losartan for persistent cough however is noted no change. She is on over-the-counter H2 blocker for her reflux.  09/28/2017  Kristin Shaffer was seen today in follow-up.  Overall she seems to be doing well.  She saw Dr. Lake Bells for pulmonary fibrosis.  He is  thought that this may be related to prior pesticide exposure or silent aspiration.  He does not feel like he has changed significantly and may not be IPF.  She denies any recurrent chest pain.  She is recovered from her intracerebral hemorrhage.  She is now back on aspirin and Plavix.  She has not had recent lipid testing.  She is n.p.o. today.  Blood pressure is top normal 140/78.  08/29/2018  Kristin Shaffer is seen today in follow-up.  Overall she seems to be doing well.  She had seen Dr. Lake Bells for pulmonary fibrosis and he felt that she did not need any further therapies but wanted to follow-up with her.  She does not have an appointment I encouraged her to reach out.  She denies any chest pain.  Blood pressure is well controlled today 122/64.  EKG shows sinus rhythm.  10/03/2019  Kristin Shaffer returns today for follow-up.  In general she feels well.  She denies any worsening shortness of breath or chest pain.  Blood pressure was initially elevated today however came down to 140/64.  She had lipid testing last year which showed total cholesterol 171, triglycerides 144, HDL 65 and LDL 77.  Her target LDL is less than 70.  She is on dual antiplatelet therapy with aspirin and Plavix.  She also takes levothyroxine and has not had a recheck recently.  She is inquiring about getting lab work today.  EKG shows sinus rhythm at 60.  12/27/2020  Kristin Shaffer is seen today in follow-up.  Overall she is doing fairly well.  She is describing arthritis in her right leg however is not improved after the injection.  She now has to walk with a cane.  She had one episode of chest discomfort but it was brief.  She said she took 2 nitroglycerin in bed without any improvement and then went to sleep.  Overall does not sound cardiac.  She follows with Dr. Vaughan Browner and pulmonary for fibrosis.  Blood pressure is good today.  She is overdue for repeat lipid and thyroid studies from her PCP.  PMHx:  Past Medical History:  Diagnosis Date  .  Anxiety   . Arthritis    "different places; not bad" (03/20/2013)  . Coronary artery disease   . Exertional shortness of breath   . GERD (gastroesophageal reflux disease)   . High cholesterol   . Hypertension   . Hypothyroidism   . Myocardial infarction [facility])    "Dr's saw evidence I might have had a heart attack" (03/20/2013)  . NSTEMI (non-ST elevated myocardial infarction) ([place]) 03/20/2013   cath - mid RCA  . Pneumonia 2012  . Skin cancer    "both legs; right arm" (03/20/2013)    Past Surgical History:  Procedure Laterality Date  . APPENDECTOMY  2003  . BREAST BIOPSY Bilateral 1990s   "total of 5; 2 on one side, 3 on the other; all benign" (  03/20/2013)  . CATARACT EXTRACTION W/ INTRAOCULAR LENS  IMPLANT, BILATERAL Bilateral ~ 2008  . CORONARY ANGIOPLASTY  12/08/2013  . CORONARY ANGIOPLASTY WITH STENT PLACEMENT  03/20/2013   NSTEMI - subtotal occlusion of mid RCA - PCI of mid RCA of long tubular 40-60% lesion - 2 tandem 95-99% subtotal occlusions - 3 overlapping Xience Xpedition DES (Dr. Roni Bread)   . [place] OF UTERUS  1956?  Marland Kitchen LACERATION REPAIR Right 03/24/2019   Procedure: COMPLEX FACIAL LACERATIONS;  Surgeon: Jerrell Belfast, MD;  Location: [facility];  Service: ENT;  Laterality: Right;  . LEFT HEART CATHETERIZATION WITH CORONARY ANGIOGRAM N/A 03/20/2013   Procedure: LEFT HEART CATHETERIZATION WITH CORONARY ANGIOGRAM;  Surgeon: Sanda Klein, MD;  Location: [facility] CATH LAB;  Service: Cardiovascular;  Laterality: N/A;  . LEFT HEART CATHETERIZATION WITH CORONARY ANGIOGRAM N/A 12/08/2013   Procedure: LEFT HEART CATHETERIZATION WITH CORONARY ANGIOGRAM;  Surgeon: Blane Ohara, MD;  Location[facility] C[facility] LAB;  Service: Cardiovascular;  Laterality: N/A;  . SKIN CANCER EXCISION     "1 off right arm; 2 off each leg" (03/20/2013)  . TRANSTHORACIC ECHOCARDIOGRAM  03/2013   EF 81-85%, grade 1 diastolic dysfunction; mild MR; calcifed MV annulus; LA mildly dilated    FAMHx:  Family  History  Problem Relation Age of Onset  . Dementia Mother   . Lung cancer Father   . CAD Father 19  . Cancer Brother     SOCHx:   reports that she has never smoked. She has never used smokeless tobacco. She reports that she does not drink alcohol and does not use drugs.  ALLERGIES:  Allergies  Allergen Reactions  . Hydrocodone-Acetaminophen Nausea And Vomiting  . Omeprazole Other (See Comments)    Made acid reflux worse  . Zocor [Simvastatin] Other (See Comments)    Trouble Walking, Leg weakness and cramping  . Latex Hives and Rash    ROS: Pertinent items noted in HPI and remainder of comprehensive ROS otherwise negative.  HOME MEDS: Current Outpatient Medications  Medication Sig Dispense Refill  . aspirin EC 81 MG tablet Take 81 mg by mouth daily.    Marland Kitchen atorvastatin (LIPITOR) 20 MG tablet TAKE 1 TABLET EVERY DAY 90 tablet 1  . CALCIUM-VITAMIN D PO Take 1 tablet by mouth daily.    . clopidogrel (PLAVIX) 75 MG tablet TAKE 1 TABLET EVERY DAY 90 tablet 3  . ezetimibe (ZETIA) 10 MG tablet TAKE 1/2 TABLET EVERY DAY 45 tablet 2  . levothyroxine (SYNTHROID) 125 MCG tablet Take 125 mcg by mouth daily.    Marland Kitchen losartan (COZAAR) 50 MG tablet TAKE 1 TABLET EVERY DAY 90 tablet 1  . Magnesium Oxide 250 MG TABS Take 1 tablet by mouth daily.    . metoprolol tartrate (LOPRESSOR) 25 MG tablet Take 0.5 tablets (12.5 mg total) by mouth 2 (two) times daily. (Patient taking differently: Take 25 mg by mouth 2 (two) times daily. Take one tablet in the morning and one tablet at night) 90 tablet 3  . Multiple Vitamins-Minerals (MULTIVITAMIN ADULT PO) Take by mouth.    . nitroGLYCERIN (NITROSTAT) 0.4 MG SL tablet Place 1 tablet (0.4 mg total) under the tongue every 5 (five) minutes as needed for chest pain. 25 tablet 6   No current facility-administered medications for this visit.    LABS/IMAGING: No results found for this or any previous visit (from the past 48 hour(s)). No results  found.  VITALS: BP 124/64 (BP Location: Left Arm, Patient Position: Sitting)  Pulse 60   Ht 5\' 4"  (1.626 m)   Wt 182 lb 9.6 oz (82.8 kg)   SpO2 97%   BMI 31.34 kg/m   EXAM: General appearance: alert and no distress Neck: no carotid bruit and no JVD Lungs: clear to auscultation bilaterally Heart: regular rate and rhythm, S1, S2 normal, no murmur, click, rub or gallop Abdomen: soft, non-tender; bowel sounds normal; no masses,  no organomegaly Extremities: extremities normal, atraumatic, no cyanosis or edema Pulses: 2+ and symmetric Skin: Skin color, texture, turgor normal. No rashes or lesions Neurologic: Grossly normal  EKG: Sinus rhythm at 60-personally reviewed  ASSESSMENT: 1. Coronary artery disease status post recent and STEMI, PCI to the RCA with 4 overlapping Xience drug-eluting stents - now with recent UA and ISR (2015) 2. Hypertension 3. Hypothyroidism 4. GERD 5. Dyslipidemia - now on lipitor, zetia and fenofibrate 6. Low back spasm 7. Progressive DOE - possible pulmonary fibrosis on CT 8. Pulmonary hypertension  9. Right flank pain 10. Moderate aorto-iliac atherosclerosis 11. Recent traumatic ICH  PLAN: 1.   Kristin Shaffer is to be stable with a noncardiac sounding episode of chest pain.  She has pulmonary fibrosis and is followed by Dr. Vaughan Browner.  Blood pressures well controlled.  Her cholesterol needs to be reassessed.  She also has hypothyroidism and we will check her TSH and free T4 as well in route results to her primary.  She denies any claudication.  She is noted to have extensive arthritis.  She has a history of moderate stenoses of the aorta and iliac arteries which have been stable by Dopplers in 2018.  Would recommend repeating this study to see if it may be contributing to her leg pain.  Pixie Casino, MD, [facility], [place] Director of the Advanced Lipid Disorders &  Cardiovascular Risk Reduction Clinic Diplomate of the  American Board of Clinical Lipidology Attending Cardiologist  Direct Dial: [phone]  Fax: [phone]  Website:  [email or web address] 12/27/2020, 2:57 PM

## 2020-12-27 NOTE — Patient Instructions (Signed)
Medication Instructions:  Your physician recommends that you continue on your current medications as directed. Please refer to the Current Medication list given to you today.  *If you need a refill on your cardiac medications before your next appointment, please call your pharmacy*   Lab Work: Lipid Panel, TSH, FreeT4  If you have labs (blood work) drawn today and your tests are completely normal, you will receive your results only by: Marland Kitchen MyChart Message (if you have MyChart) OR . A paper copy in the mail If you have any lab test that is abnormal or we need to change your treatment, we will call you to review the results.   Follow-Up: At [facility], you and your health needs are our priority.  As part of our continuing mission to provide you with exceptional heart care, we have created designated Provider Care Teams.  These Care Teams include your primary Cardiologist (physician) and Advanced Practice Providers (APPs -  Physician Assistants and Nurse Practitioners) who all work together to provide you with the care you need, when you need it.  We recommend signing up for the patient portal called "MyChart".  Sign up information is provided on this After Visit Summary.  MyChart is used to connect with patients for Virtual Visits (Telemedicine).  Patients are able to view lab/test results, encounter notes, upcoming appointments, etc.  Non-urgent messages can be sent to your provider as well.   To learn more about what you can do with MyChart, go to [email or web address].    Your next appointment:   12 month(s)  The format for your next appointment:   In Person  Provider:   You may see Pixie Casino, MD or one of the following Advanced Practice Providers on your designated Care Team:    Almyra Deforest, PA-C  Fabian Sharp, PA-C or   Roby Lofts, [place]    Other Instructions

## 2020-12-28 ENCOUNTER — Encounter: Payer: Self-pay | Admitting: Internal Medicine

## 2020-12-28 LAB — LIPID PANEL
Chol/HDL Ratio: 3 ratio (ref 0.0–4.4)
Cholesterol, Total: 175 mg/dL (ref 100–199)
HDL: 59 mg/dL (ref >39)
LDL Chol Calc (NIH): 90 mg/dL (ref 0–99)
Triglycerides: 154 mg/dL — ABNORMAL HIGH (ref 0–149)
VLDL Cholesterol Cal: 26 mg/dL (ref 5–40)

## 2020-12-28 LAB — TSH: TSH: 2.48 u[IU]/mL (ref 0.450–4.500)

## 2020-12-28 LAB — T4, FREE: Free T4: 1.47 ng/dL (ref 0.82–1.77)

## 2021-01-07 ENCOUNTER — Other Ambulatory Visit: Payer: Self-pay

## 2021-01-07 ENCOUNTER — Ambulatory Visit (HOSPITAL_COMMUNITY)
Admission: RE | Admit: 2021-01-07 | Discharge: 2021-01-07 | Disposition: A | Discharge status: Home / Self Care | Payer: Medicare Other | Source: Ambulatory Visit | Attending: Cardiovascular Disease | Admitting: Cardiovascular Disease

## 2021-01-07 DIAGNOSIS — I739 Peripheral vascular disease, unspecified: Secondary | ICD-10-CM | POA: Diagnosis present

## 2021-01-31 ENCOUNTER — Encounter: Payer: Self-pay | Admitting: Pulmonary Disease

## 2021-01-31 ENCOUNTER — Other Ambulatory Visit: Payer: Self-pay

## 2021-01-31 ENCOUNTER — Ambulatory Visit (INDEPENDENT_AMBULATORY_CARE_PROVIDER_SITE_OTHER): Payer: Medicare Other | Admitting: Pulmonary Disease

## 2021-01-31 VITALS — BP 132/70 | HR 63 | Temp 97.6°F | Ht 64.0 in | Wt 184.6 lb

## 2021-01-31 DIAGNOSIS — I2510 Atherosclerotic heart disease of native coronary artery without angina pectoris: Secondary | ICD-10-CM | POA: Diagnosis not present

## 2021-01-31 DIAGNOSIS — J849 Interstitial pulmonary disease, unspecified: Secondary | ICD-10-CM | POA: Diagnosis not present

## 2021-01-31 NOTE — Patient Instructions (Signed)
We will schedule you for high-resolution CT Pulmonary function test and follow-up in clinic on the same day at next available

## 2021-01-31 NOTE — Addendum Note (Signed)
Addended by: Merrilee Seashore on: 01/31/2021 12:15 PM   Modules accepted: Orders

## 2021-01-31 NOTE — Progress Notes (Signed)
Kristin Shaffer    [id number]    11-15-1931  Primary Care Physician:Eksir, Earnest Conroy, MD  Referring Physician: Nickola Major, MD [address] [place],  [place] [address]  Chief complaint:   Consult for interstitial lung disease  HPI: 87 year old with history of coronary artery disease, GERD, hypothyroidism, hypertension Previously evaluated by Dr. Lake Bells in the pulmonary clinic for mild CT findings of ILD suggestive of NSIP.  Serologies were negative. Conservative management advised  She has been referred back to pulmonary clinic for evaluation as her cardiologist and primary care noted crackles on lung auscultation.  She states that she is doing well, no issues with breathing.  Pets: Had a dog before.  No pets currently Occupation: Housewife Exposures: No mold, hot tub, Jacuzzi.  She had feather pillows in the house in the remote past but exposure was not significant Smoking history: Never smoker Travel history: No significant travel history Relevant family history: No family history of lung disease   Outpatient Encounter Medications as of 01/31/2021  Medication Sig  . aspirin EC 81 MG tablet Take 81 mg by mouth daily.  Marland Kitchen atorvastatin (LIPITOR) 20 MG tablet TAKE 1 TABLET EVERY DAY  . CALCIUM-VITAMIN D PO Take 1 tablet by mouth daily.  . clopidogrel (PLAVIX) 75 MG tablet TAKE 1 TABLET EVERY DAY  . ezetimibe (ZETIA) 10 MG tablet TAKE 1/2 TABLET EVERY DAY  . levothyroxine (SYNTHROID) 125 MCG tablet Take 125 mcg by mouth daily.  Marland Kitchen losartan (COZAAR) 50 MG tablet TAKE 1 TABLET EVERY DAY  . Magnesium Oxide 250 MG TABS Take 1 tablet by mouth daily.  . metoprolol tartrate (LOPRESSOR) 25 MG tablet Take 0.5 tablets (12.5 mg total) by mouth 2 (two) times daily. (Patient taking differently: Take 25 mg by mouth 2 (two) times daily. Take one tablet in the morning and one tablet at night)  . Multiple Vitamins-Minerals (MULTIVITAMIN ADULT PO) Take by mouth.  .  nitroGLYCERIN (NITROSTAT) 0.4 MG SL tablet Place 1 tablet (0.4 mg total) under the tongue every 5 (five) minutes as needed for chest pain.   No facility-administered encounter medications on file as of 01/31/2021.    Allergies as of 01/31/2021 - Review Complete 01/31/2021  Allergen Reaction Noted  . Hydrocodone-acetaminophen Nausea And Vomiting 09/09/2016  . Omeprazole Other (See Comments) 09/09/2016  . Zocor [simvastatin] Other (See Comments) 12/07/2013  . Latex Hives and Rash 03/20/2013    Past Medical History:  Diagnosis Date  . Anxiety   . Arthritis    "different places; not bad" (03/20/2013)  . Coronary artery disease   . Exertional shortness of breath   . GERD (gastroesophageal reflux disease)   . High cholesterol   . Hypertension   . Hypothyroidism   . Myocardial infarction [facility])    "Dr's saw evidence I might have had a heart attack" (03/20/2013)  . NSTEMI (non-ST elevated myocardial infarction) ([place]) 03/20/2013   cath - mid RCA  . Pneumonia 2012  . Skin cancer    "both legs; right arm" (03/20/2013)    Past Surgical History:  Procedure Laterality Date  . APPENDECTOMY  2003  . BREAST BIOPSY Bilateral 1990s   "total of 5; 2 on one side, 3 on the other; all benign" (03/20/2013)  . CATARACT EXTRACTION W/ INTRAOCULAR LENS  IMPLANT, BILATERAL Bilateral ~ 2008  . CORONARY ANGIOPLASTY  12/08/2013  . CORONARY ANGIOPLASTY WITH STENT PLACEMENT  03/20/2013   NSTEMI - subtotal occlusion of mid  RCA - PCI of mid RCA of long tubular 40-60% lesion - 2 tandem 95-99% subtotal occlusions - 3 overlapping Xience Xpedition DES (Dr. Roni Bread)   . [place] OF UTERUS  1956?  Marland Kitchen LACERATION REPAIR Right 03/24/2019   Procedure: COMPLEX FACIAL LACERATIONS;  Surgeon: Jerrell Belfast, MD;  Location: [facility];  Service: ENT;  Laterality: Right;  . LEFT HEART CATHETERIZATION WITH CORONARY ANGIOGRAM N/A 03/20/2013   Procedure: LEFT HEART CATHETERIZATION WITH CORONARY ANGIOGRAM;  Surgeon: Sanda Klein, MD;  Location: [facility] CATH LAB;  Service: Cardiovascular;  Laterality: N/A;  . LEFT HEART CATHETERIZATION WITH CORONARY ANGIOGRAM N/A 12/08/2013   Procedure: LEFT HEART CATHETERIZATION WITH CORONARY ANGIOGRAM;  Surgeon: Blane Ohara, MD;  Location[facility] C[facility] LAB;  Service: Cardiovascular;  Laterality: N/A;  . SKIN CANCER EXCISION     "1 off right arm; 2 off each leg" (03/20/2013)  . TRANSTHORACIC ECHOCARDIOGRAM  03/2013   EF 53-61%, grade 1 diastolic dysfunction; mild MR; calcifed MV annulus; LA mildly dilated    Family History  Problem Relation Age of Onset  . Dementia Mother   . Lung cancer Father   . CAD Father 42  . Cancer Brother     Social History   Socioeconomic History  . Marital status: Widowed    Spouse name: Not on file  . Number of children: Not on file  . Years of education: Not on file  . Highest education level: Not on file  Occupational History  . Occupation: retired   Tobacco Use  . Smoking status: Never Smoker  . Smokeless tobacco: Never Used  Substance and Sexual Activity  . Alcohol use: No    Alcohol/week: 0.0 standard drinks  . Drug use: No  . Sexual activity: Never  Other Topics Concern  . Not on file  Social History Narrative   Lives alone.    Social Determinants of Health   Financial Resource Strain: Not on file  Food Insecurity: Not on file  Transportation Needs: Not on file  Physical Activity: Not on file  Stress: Not on file  Social Connections: Not on file  Intimate Partner Violence: Not on file    Review of systems: Review of Systems  Constitutional: Negative for fever and chills.  HENT: Negative.   Eyes: Negative for blurred vision.  Respiratory: as per HPI  Cardiovascular: Negative for chest pain and palpitations.  Gastrointestinal: Negative for vomiting, diarrhea, blood per rectum. Genitourinary: Negative for dysuria, urgency, frequency and hematuria.  Musculoskeletal: Negative for myalgias, back pain and joint pain.   Skin: Negative for itching and rash.  Neurological: Negative for dizziness, tremors, focal weakness, seizures and loss of consciousness.  Endo/Heme/Allergies: Negative for environmental allergies.  Psychiatric/Behavioral: Negative for depression, suicidal ideas and hallucinations.  All other systems reviewed and are negative.  Physical Exam: Blood pressure 132/70, pulse 63, temperature 97.6 F (36.4 C), temperature source Temporal, height 5\' 4"  (1.626 m), weight 184 lb 9.6 oz (83.7 kg), SpO2 97 %. Gen:      No acute distress HEENT:  EOMI, sclera anicteric Neck:     No masses; no thyromegaly Lungs:   Mild bibasal crackles CV:         Regular rate and rhythm; no murmurs Abd:      + bowel sounds; soft, non-tender; no palpable masses, no distension Ext:    No edema; adequate peripheral perfusion Skin:      Warm and dry; no rash Neuro: alert and oriented x 3 Psych: normal  mood and affect  Data Reviewed: Imaging: CT high-resolution 12/22/2016-subtle changes of groundglass attenuation, bronchiectasis with moderate air trapping.  Alternate diagnosis suggestive of NSIP.  I have reviewed the images personally.  PFTs: 08/22/2018 FVC 1.94 [80%], FEV1 1.80 [100%], F/F 93, TLC 4.56 [88%], DLCO 17.34 [69%] Isolated reduction of diffusion capacity  Labs: CTD serologies 06/16/2016-negative  Assessment:  ILD Prior imaging noted with minimal subtle changes suggestive of NSIP, she does not have any symptoms of connective tissue disease, CTD serologies are negative Symptomatically she is doing well.  Schedule high-res CT and PFTs for reevaluation.  Plan/Recommendations: High-res CT, PFTs  Marshell Garfinkel MD [facility] 01/31/2021, 9:42 AM  CC: Nickola Major, MD

## 2021-02-03 ENCOUNTER — Telehealth: Payer: Self-pay | Admitting: Internal Medicine

## 2021-02-03 NOTE — Telephone Encounter (Signed)
If it was the statin, should be getting better. She can stop the zetia too, but that is unlikely to be the cause - if it continues to get worse, she needs to be seen by PCP or orthopedist again for more work-up.  Dr Lemmie Evens

## 2021-02-03 NOTE — Telephone Encounter (Signed)
   Pt said her leg is still in pain, she said she was asked by Dr. Debara Pickett to callback in 2 weeks if there's no improvement. She was taken off Atorvastatin for 2 weeks but she wanted to ask if she also have to stop taking zetia since her sister told her this is a cholesterol pill as well. She said her leg pain is getting worst and can't barely walk

## 2021-02-03 NOTE — Telephone Encounter (Signed)
Spoke with pt, aware of dr hilty's recommendations.

## 2021-02-03 NOTE — Telephone Encounter (Signed)
Spoke with pt, she has been off the atorvastatin for 3 weeks now and the pain is getting worse. She was seen by orthopedic and x-ray told them she has bad arthritis in that leg. She would like to know if dr hilty has any recommendations regarding the pain and medications.

## 2021-02-04 ENCOUNTER — Ambulatory Visit (HOSPITAL_BASED_OUTPATIENT_CLINIC_OR_DEPARTMENT_OTHER)
Admission: RE | Admit: 2021-02-04 | Discharge: 2021-02-04 | Disposition: A | Discharge status: Home / Self Care | Payer: Medicare Other | Source: Ambulatory Visit | Attending: Pulmonary Disease | Admitting: Pulmonary Disease

## 2021-02-04 ENCOUNTER — Other Ambulatory Visit: Payer: Self-pay

## 2021-02-04 DIAGNOSIS — J849 Interstitial pulmonary disease, unspecified: Secondary | ICD-10-CM | POA: Diagnosis not present

## 2021-02-07 ENCOUNTER — Other Ambulatory Visit: Payer: Self-pay | Admitting: Internal Medicine

## 2021-02-28 ENCOUNTER — Other Ambulatory Visit: Payer: Self-pay

## 2021-02-28 ENCOUNTER — Encounter: Payer: Self-pay | Admitting: Pulmonary Disease

## 2021-02-28 ENCOUNTER — Ambulatory Visit (INDEPENDENT_AMBULATORY_CARE_PROVIDER_SITE_OTHER): Payer: Medicare Other | Admitting: Pulmonary Disease

## 2021-02-28 VITALS — BP 110/68 | HR 65 | Temp 97.0°F | Ht 64.5 in | Wt 183.0 lb

## 2021-02-28 DIAGNOSIS — J849 Interstitial pulmonary disease, unspecified: Secondary | ICD-10-CM

## 2021-02-28 DIAGNOSIS — I2510 Atherosclerotic heart disease of native coronary artery without angina pectoris: Secondary | ICD-10-CM | POA: Diagnosis not present

## 2021-02-28 LAB — PULMONARY FUNCTION TEST
DL/VA % pred: 122 %
DL/VA: 4.9 ml/min/mmHg/L
DLCO cor % pred: 93 %
DLCO cor: 17.45 ml/min/mmHg
DLCO unc % pred: 93 %
DLCO unc: 17.45 ml/min/mmHg
FEF 25-75 Post: 2.86 L/sec
FEF 25-75 Pre: 2.56 L/sec
FEF2575-%Change-Post: 11 %
FEF2575-%Pred-Post: 281 %
FEF2575-%Pred-Pre: 252 %
FEV1-%Change-Post: 2 %
FEV1-%Pred-Post: 113 %
FEV1-%Pred-Pre: 110 %
FEV1-Post: 1.93 L
FEV1-Pre: 1.89 L
FEV1FVC-%Change-Post: 5 %
FEV1FVC-%Pred-Pre: 122 %
FEV6-%Change-Post: -3 %
FEV6-%Pred-Post: 95 %
FEV6-%Pred-Pre: 98 %
FEV6-Post: 2.06 L
FEV6-Pre: 2.13 L
FEV6FVC-%Pred-Post: 106 %
FEV6FVC-%Pred-Pre: 106 %
FVC-%Change-Post: -3 %
FVC-%Pred-Post: 89 %
FVC-%Pred-Pre: 92 %
FVC-Post: 2.06 L
FVC-Pre: 2.13 L
Post FEV1/FVC ratio: 93 %
Post FEV6/FVC ratio: 100 %
Pre FEV1/FVC ratio: 88 %
Pre FEV6/FVC Ratio: 100 %
RV % pred: 78 %
RV: 2.03 L
TLC % pred: 82 %
TLC: 4.21 L

## 2021-02-28 NOTE — Progress Notes (Signed)
Full PFT performed today. °

## 2021-02-28 NOTE — Patient Instructions (Signed)
Full PFT performed today. °

## 2021-02-28 NOTE — Addendum Note (Signed)
Addended by: Gavin Potters R on: 02/28/2021 10:33 AM   Modules accepted: Orders

## 2021-02-28 NOTE — Patient Instructions (Signed)
I have reviewed his CT scan which shows very minimal changes of scarring Your lung function tests are stable  Since you are doing well I do not recommend any treatment at this point  Schedule follow-up high-resolution CT in 1 year Follow-up in clinic in 1 year after CT

## 2021-02-28 NOTE — Progress Notes (Signed)
Kristin Shaffer    [id number]    03-29-1931  Primary Care Physician:Eksir, Earnest Conroy, MD  Referring Physician: Nickola Major, MD [address] [place],  [place] [address]  Chief complaint:   Follow-up for interstitial lung disease  HPI: 87 year old with history of coronary artery disease, GERD, hypothyroidism, hypertension Previously evaluated by Dr. Lake Bells in the pulmonary clinic for mild CT findings of ILD suggestive of NSIP.  Serologies were negative. Conservative management advised  She has been referred back to pulmonary clinic for evaluation as her cardiologist and primary care noted crackles on lung auscultation.  She states that she is doing well, no issues with breathing.  Pets: Had a dog before.  No pets currently Occupation: Housewife Exposures: No mold, hot tub, Jacuzzi.  She had feather pillows in the house in the remote past but exposure was not significant Smoking history: Never smoker Travel history: No significant travel history Relevant family history: No family history of lung disease  Interim history: She is doing well with no dyspnea Here for review of CT and PFTs   Outpatient Encounter Medications as of 02/28/2021  Medication Sig  . aspirin EC 81 MG tablet Take 81 mg by mouth daily.  Marland Kitchen CALCIUM-VITAMIN D PO Take 1 tablet by mouth daily.  . clopidogrel (PLAVIX) 75 MG tablet TAKE 1 TABLET EVERY DAY  . levothyroxine (SYNTHROID) 125 MCG tablet Take 125 mcg by mouth daily.  Marland Kitchen losartan (COZAAR) 50 MG tablet TAKE 1 TABLET EVERY DAY  . Magnesium Oxide 250 MG TABS Take 1 tablet by mouth daily.  . metoprolol tartrate (LOPRESSOR) 25 MG tablet Take 0.5 tablets (12.5 mg total) by mouth 2 (two) times daily. (Patient taking differently: Take 25 mg by mouth 2 (two) times daily. Take one tablet in the morning and one tablet at night)  . Multiple Vitamins-Minerals (MULTIVITAMIN ADULT PO) Take by mouth.  . nitroGLYCERIN (NITROSTAT) 0.4 MG SL tablet Place  1 tablet (0.4 mg total) under the tongue every 5 (five) minutes as needed for chest pain.   No facility-administered encounter medications on file as of 02/28/2021.    Physical Exam: Blood pressure 132/70, pulse 63, temperature 97.6 F (36.4 C), temperature source Temporal, height 5\' 4"  (1.626 m), weight 184 lb 9.6 oz (83.7 kg), SpO2 97 %. Gen:      No acute distress HEENT:  EOMI, sclera anicteric Neck:     No masses; no thyromegaly Lungs:   Mild bibasal crackles CV:         Regular rate and rhythm; no murmurs Abd:      + bowel sounds; soft, non-tender; no palpable masses, no distension Ext:    No edema; adequate peripheral perfusion Skin:      Warm and dry; no rash Neuro: alert and oriented x 3 Psych: normal mood and affect  Data Reviewed: Imaging: CT high-resolution 12/22/2016-subtle changes of groundglass attenuation, bronchiectasis with moderate air trapping.  Alternate diagnosis suggestive of NSIP.   High-res CT 02/04/2021-tracheobronchomalacia.  PFTs: 08/22/2018 FVC 1.94 [80%], FEV1 1.80 [100%], F/F 93, TLC 4.56 [88%], DLCO 17.34 [69%] Isolated reduction of diffusion capacity  Labs: CTD serologies 06/16/2016-negative  Assessment:  ILD Prior imaging noted with minimal subtle changes suggestive of NSIP, she does not have any symptoms of connective tissue disease, CTD serologies are negative Symptomatically she is doing well.  Repeat CT reviewed with minimal change in subtle changes, PFTs are normal I suspect her air trapping is due  to severe tracheobronchomalacia with no significant underlying interstitial lung disease Since she is doing well I would not recommend any treatment Continue monitoring with follow-up high-res CT in 1 year  Plan/Recommendations: High-res CT in 1 year  Marshell Garfinkel MD [facility] 02/28/2021, 10:20 AM  CC: Nickola Major, MD

## 2021-04-15 ENCOUNTER — Telehealth: Payer: Self-pay | Admitting: Internal Medicine

## 2021-04-15 MED ORDER — METOPROLOL TARTRATE 25 MG PO TABS: 12.5000 mg | ORAL_TABLET | Freq: Two times a day (BID) | ORAL | 3 refills | Status: DC

## 2021-04-15 NOTE — Telephone Encounter (Signed)
*  STAT* If patient is at the pharmacy, call can be transferred to refill team.   1. Which medications need to be refilled? (please list name of each medication and dose if known) metoprolol tartrate (LOPRESSOR) 25 MG tablet  2. Which pharmacy/location (including street and city if local pharmacy) is medication to be sent to?[place], [place]  3. Do they need a 30 day or 90 day supply? 90 day supply  Pt is all out of this medication

## 2021-04-15 NOTE — Telephone Encounter (Signed)
RN called patient to confirm direction and how she is taking medication.  patient states she is taking Metoprolol tartrate 12.5 mg ( 1/2 tablet of 25 mg) twice a day . Patient states she spoke to [facility]  And asked the mail order to send refill request to Dr Debara Pickett. Previously it had been filled by her primary in [place]  E-sent prescription to [facility] - with corrected directions.- Metoprolol tartrate 25 mg  Take 0.5 tablet ( 12.5 mg) twice a day .

## 2021-05-09 ENCOUNTER — Other Ambulatory Visit: Payer: Self-pay | Admitting: Internal Medicine

## 2021-09-13 ENCOUNTER — Telehealth: Payer: Self-pay | Admitting: Internal Medicine

## 2021-09-13 NOTE — Telephone Encounter (Signed)
*  STAT* If patient is at the pharmacy, call can be transferred to refill team.   1. Which medications need to be refilled? (please list name of each medication and dose if known) levothyroxine (SYNTHROID) 125 MCG tablet  2. Which pharmacy/location (including street and city if local pharmacy) is medication to be sent to? [id number] - [place], [place] - [address]  3. Do they need a 30 day or 90 day supply? 90 DS

## 2021-09-13 NOTE — Telephone Encounter (Signed)
Called pharmacy- advised it comes from patients primary care doctor.  They will send to them for refill.

## 2021-09-14 ENCOUNTER — Telehealth: Payer: Self-pay | Admitting: Internal Medicine

## 2021-09-14 MED ORDER — NITROGLYCERIN 0.4 MG SL SUBL: 0.4000 mg | SUBLINGUAL_TABLET | SUBLINGUAL | 6 refills | Status: DC | PRN

## 2021-09-14 NOTE — Telephone Encounter (Signed)
*  STAT* If patient is at the pharmacy, call can be transferred to refill team.   1. Which medications need to be refilled? (please list name of each medication and dose if known)   nitroGLYCERIN (NITROSTAT) 0.4 MG SL tablet    2. Which pharmacy/location (including street and city if local pharmacy) is medication to be sent to?  [facility] - [place], [place] - [address]. Do they need a 30 day or 90 day supply?  90 day  

## 2021-11-29 ENCOUNTER — Other Ambulatory Visit: Payer: Self-pay | Admitting: Internal Medicine

## 2021-11-29 MED ORDER — METOPROLOL TARTRATE 25 MG PO TABS: 12.5000 mg | ORAL_TABLET | Freq: Two times a day (BID) | ORAL | 0 refills | Status: DC

## 2021-12-12 ENCOUNTER — Other Ambulatory Visit: Payer: Self-pay | Admitting: Internal Medicine

## 2021-12-28 ENCOUNTER — Other Ambulatory Visit: Payer: Self-pay | Admitting: Internal Medicine

## 2022-01-20 ENCOUNTER — Other Ambulatory Visit: Payer: Self-pay

## 2022-01-20 ENCOUNTER — Encounter: Payer: Self-pay | Admitting: Pulmonary Disease

## 2022-01-20 ENCOUNTER — Ambulatory Visit (INDEPENDENT_AMBULATORY_CARE_PROVIDER_SITE_OTHER): Payer: Medicare Other | Admitting: Pulmonary Disease

## 2022-01-20 VITALS — BP 136/64 | HR 57 | Temp 97.7°F | Ht 64.5 in | Wt 183.0 lb

## 2022-01-20 DIAGNOSIS — J849 Interstitial pulmonary disease, unspecified: Secondary | ICD-10-CM | POA: Diagnosis not present

## 2022-01-20 NOTE — Addendum Note (Signed)
Addended by: Elton Sin on: 01/20/2022 10:58 AM   Modules accepted: Orders

## 2022-01-20 NOTE — Patient Instructions (Signed)
We will see if he can get the CT scan scheduled sooner as I want her reassessment of the lung Schedule PFTs and follow-up in clinic at next available

## 2022-01-20 NOTE — Progress Notes (Signed)
Kristin Shaffer    [id number]    14-Dec-1930  Primary Care Physician:Eksir, Earnest Conroy, MD  Referring Physician: Nickola Major, MD [address] [place],  [place] [address]  Chief complaint:   Follow-up for interstitial lung disease lung disease  HPI: 88 year old with history of coronary artery disease, GERD, hypothyroidism, hypertension Previously evaluated by Dr. Lake Bells in the pulmonary clinic for mild CT findings of ILD suggestive of NSIP.  Serologies were negative. Conservative management advised  She has been referred back to pulmonary clinic for evaluation as her cardiologist and primary care noted crackles on lung auscultation.  She states that she is doing well, no issues with breathing.  Pets: Had a dog before.  No pets currently Occupation: Housewife Exposures: No mold, hot tub, Jacuzzi.  She had feather pillows in the house in the remote past but exposure was not significant Smoking history: Never smoker Travel history: No significant travel history Relevant family history: No family history of lung disease  Interim history: States that breathing is stable Recently diagnosed with psoriasis of the scalp on the occipital area and is using triamcinolone cream  Outpatient Encounter Medications as of 01/20/2022  Medication Sig   aspirin EC 81 MG tablet Take 81 mg by mouth daily.   CALCIUM-VITAMIN D PO Take 1 tablet by mouth daily.   chlorthalidone (HYGROTON) 25 MG tablet TAKE 1 TABLET(25 MG) BY MOUTH DAILY FOR BLOOD PRESSURE   clopidogrel (PLAVIX) 75 MG tablet TAKE 1 TABLET EVERY DAY   losartan (COZAAR) 50 MG tablet Take 1 tablet (50 mg total) by mouth daily. Keep upcoming appointment   Magnesium Oxide 250 MG TABS Take 1 tablet by mouth daily.   metoprolol tartrate (LOPRESSOR) 25 MG tablet Take 0.5 tablets (12.5 mg total) by mouth 2 (two) times daily.   Multiple Vitamins-Minerals (MULTIVITAMIN ADULT PO) Take by mouth.   triamcinolone cream (KENALOG)  0.1 % Apply topically.   nitroGLYCERIN (NITROSTAT) 0.4 MG SL tablet Place 1 tablet (0.4 mg total) under the tongue every 5 (five) minutes as needed for chest pain. (Patient not taking: Reported on 01/20/2022)   No facility-administered encounter medications on file as of 01/20/2022.    Physical Exam: Blood pressure 136/64, pulse (!) 57, temperature 97.7 F (36.5 C), temperature source Oral, height 5' 4.5" (1.638 m), weight 183 lb (83 kg), SpO2 98 %. Gen:      No acute distress HEENT:  EOMI, sclera anicteric Neck:     No masses; no thyromegaly Lungs:    Bibasal crackles CV:         Regular rate and rhythm; no murmurs Abd:      + bowel sounds; soft, non-tender; no palpable masses, no distension Ext:    No edema; adequate peripheral perfusion Skin:      Warm and dry; no rash Neuro: alert and oriented x 3 Psych: normal mood and affect   Data Reviewed: Imaging: CT high-resolution 12/22/2016-subtle changes of groundglass attenuation, bronchiectasis with moderate air trapping.  Alternate diagnosis suggestive of NSIP.   High-res CT 02/04/2021-tracheobronchomalacia.,  Stable findings of ILD I reviewed the images personally.  PFTs: 08/22/2018 FVC 1.94 [80%], FEV1 1.80 [100%], F/F 93, TLC 4.56 [88%], DLCO 17.34 [69%] Isolated reduction of diffusion capacity  02/28/2021 FVC 2.06 [89%], FEV1 1.93 [113%], F/F 93, TLC 4.21 [82%], DLCO 17.45 [93%] Normal pulmonary function test  Labs: CTD serologies 06/16/2016-negative  Assessment:  ILD Prior imaging noted with minimal subtle changes suggestive  of NSIP, she does not have any symptoms of connective tissue disease, CTD serologies are negative Symptomatically she is doing well.  Symptomatically she is doing well and PFTs are normal however she will need a reassessment given new diagnosis of psoriasis.  She is due for high-res CT and PFTs follow-up  We will review scan and possibly discuss at multidisciplinary  conference  Plan/Recommendations: High-res CT, PFTs  Marshell Garfinkel MD [facility] 01/20/2022, 10:39 AM  CC: Nickola Major, MD

## 2022-02-07 ENCOUNTER — Other Ambulatory Visit: Payer: Self-pay

## 2022-02-07 ENCOUNTER — Ambulatory Visit (HOSPITAL_COMMUNITY)
Admission: RE | Admit: 2022-02-07 | Discharge: 2022-02-07 | Disposition: A | Discharge status: Home / Self Care | Payer: Medicare Other | Source: Ambulatory Visit | Attending: Pulmonary Disease | Admitting: Pulmonary Disease

## 2022-02-07 ENCOUNTER — Encounter (HOSPITAL_COMMUNITY): Payer: Self-pay

## 2022-02-07 ENCOUNTER — Other Ambulatory Visit (HOSPITAL_COMMUNITY): Payer: Medicare Other

## 2022-02-07 DIAGNOSIS — J849 Interstitial pulmonary disease, unspecified: Secondary | ICD-10-CM | POA: Insufficient documentation

## 2022-02-10 ENCOUNTER — Telehealth: Payer: Self-pay | Admitting: Pulmonary Disease

## 2022-02-10 NOTE — Telephone Encounter (Signed)
Called and spoke to patient. She is wanting to know if results are ready from CT scan she had done on 3/15. I did let her know that Dr. Vaughan Browner had not commented on results of CT yet but that I would send him a message and let him know she was asking about results. Patient voiced understanding and is aware we will call her back with results when they are ready.  ? ?Dr. Vaughan Browner please advise.  ?Please route back to triage.  ? ?Thanks! ?

## 2022-02-13 NOTE — Telephone Encounter (Signed)
Spoke with Kristin Shaffer and review CT results as dictated by Dr. Vaughan Browner. Kristin Shaffer stated understanding. Nothing further needed at this time.  ?

## 2022-02-13 NOTE — Telephone Encounter (Signed)
I have compared the CT to prior scans dating back to 2018.  It looks stable with no change which is good news ?

## 2022-02-15 ENCOUNTER — Telehealth: Payer: Self-pay | Admitting: Pulmonary Disease

## 2022-02-15 NOTE — Telephone Encounter (Signed)
Called and spoke with pt letting her know to keep appts as scheduled and she verbalized understanding. Nothing further needed. ?

## 2022-02-17 ENCOUNTER — Other Ambulatory Visit: Payer: Self-pay

## 2022-02-17 ENCOUNTER — Encounter: Payer: Self-pay | Admitting: Internal Medicine

## 2022-02-17 ENCOUNTER — Ambulatory Visit (INDEPENDENT_AMBULATORY_CARE_PROVIDER_SITE_OTHER): Payer: Medicare Other | Admitting: Internal Medicine

## 2022-02-17 VITALS — BP 140/72 | HR 59 | Ht 62.0 in | Wt 183.6 lb

## 2022-02-17 DIAGNOSIS — I2510 Atherosclerotic heart disease of native coronary artery without angina pectoris: Secondary | ICD-10-CM

## 2022-02-17 DIAGNOSIS — M79604 Pain in right leg: Secondary | ICD-10-CM

## 2022-02-17 DIAGNOSIS — I739 Peripheral vascular disease, unspecified: Secondary | ICD-10-CM

## 2022-02-17 DIAGNOSIS — E782 Mixed hyperlipidemia: Secondary | ICD-10-CM

## 2022-02-17 DIAGNOSIS — M79605 Pain in left leg: Secondary | ICD-10-CM

## 2022-02-17 NOTE — Progress Notes (Signed)
? ? ?OFFICE NOTE ? ?Chief Complaint:  ?Routine follow-up ? ?Primary Care Physician: ?Nickola Major, MD ? ?HPI:  ?Kristin Shaffer is an 88 year old Caucasian female with a history of hyperlipidemia and hypothyroidism. She was seen by myself on March 20, 2013, and at that time was found to be in hypertensive urgency with a blood pressure of 230/94. She was admitted to [facility] for blood pressure control and cardiac catheterization for unstable angina. She did in fact have a non-ST-elevation myocardial infarction with a mildly elevated troponin of 0.67. She underwent coronary angiography, which revealed a subtotal occlusion of the mid right coronary artery. She then underwent a successful complex PCI of the entire mid RCA encompassing a long tubular 40% to 60% lesion that preceded 2 tandem 95% and 99% subtotal occlusions. This was completed using 3 overlapping Xience Xpedition drug-eluting stents. She was placed on aspirin and Brilinta, and was discharged. She subsequently returned to [facility] on April 26 to the emergency room with chest pain. Patient states that it was a "sharp, stabbing, and burning" pain. It was 10 out of 10 in intensity. She took 4 baby aspirin at home, but did not have any nitroglycerin. EMS gave her one, which provided her some relief, and midway to the ER, they gave her another one, which essentially resolved the pain. Patient reports feeling weak, but that is improving compared to when she was discharged. She has had no chest pain since the episode on the 26th. She was provided with a prescription at the ER for sublingual nitroglycerin. She did undergo an echocardiogram which showed preserved LV systolic function and an EF of 60-65%. There was stage I diastolic dysfunction and aortic sclerosis with mild central aortic regurgitation. Otherwise no significant abnormalities.  She also had carotid and renal Dopplers. The carotid Dopplers indicated a small amount of bilateral  plaque. The renal Dopplers did not indicate any renal artery stenosis. ? ?Recently she had lipid profile performed which showed a particle number of 1783, with LDL C. of 143.  Based on this I recommended increasing her Lipitor from 20-40 mg.  She did make this change, but as noted pain and weakness in her joints, especially when she wakes up in the morning which takes about 30 minutes to improve. This sounds a lot like arthritis, but she really feels it is related to her cholesterol medicine. ? ?Overall, at her last office visit I felt she was doing fairly well. Unfortunately about 5 days after that visit she started to have acute onset chest discomfort with radiation of pain into her left arm. She presented to the emergency department and was found to have unstable angina. Cardiac catheterization was performed which demonstrated 99% in-stent stenosis at the previously placed mid RCA stent.  I am interested as to why she presented with acute unstable angina, and not progressive anginal symptoms with exertion. She certainly had absolutely no chest pain or arm pain just a few days prior to the presentation, which is more consistent with an acute coronary syndrome. ? ?Kristin Shaffer returns today for follow-up. She reports that she has had to take nitroglycerin twice for chest discomfort. Wants while riding in a car and another time at rest. Both episodes sound more like reflux however she did have improvement after about 15 minutes. She recently is been having problems with bruising on aspirin and Effient and is concerned about being on strong blood thinners. Her last stent placement was in 11/2013. Dual antiplatelet therapy  was recommended for at least a year. ? ?I saw Kristin Shaffer back in the office today. She is complaining of some weakness and fatigue. She also soreness in her muscles, particularly when she gets up in the morning. Some of it seems to be joint soreness which is concerning for osteoarthritis. She clearly  has signs of osteoarthritis in her DIP joints and there is some crepitus in her knees. It is however possible that some of her symptoms could be related to Lipitor. She is on combination Lipitor and fenofibrate, which is slightly higher risk of developing myalgias. Unfortunately, her cholesterol has been really well controlled. ? ? Kristin Shaffer returns today for follow-up. She recently was planting some irises in her lawn and is complaining of some back pain and spasm. In the past she had some relief from Flexeril, however does not currently have any. She denies any cardiac chest pain. She does get some occasional reflux symptoms associated with certain foods. ? ?Kristin Shaffer see him back in the office again today. She has some intermittent chest discomfort. The symptoms seem atypical and are not like the chest and left arm pain she had in the past. She recently saw her primary care provider who noted some possible fluid in the right lung base. I do hear some faint crackles today however it sounds to me more like atelectasis or even fibrosis. She reports some shortness of breath. She also thinks she may be having reflux symptoms but is hesitant to take omeprazole because she says that she feels that could cause her chest pain. I reassured her that it would not. ? ?04/14/2016 ? ?Kristin Shaffer was seen back today in follow-up for review of her echocardiogram. This is essentially unchanged compared to her prior study in 2014. Her pulmonary pressure is elevated at 45 mmHg. LVEF is 60-65%. There is mild diastolic dysfunction. Although her primary pressure is elevated, this is not likely a significant cause of her shortness of breath given the mild elevation. I am concerned however that she may have underlying pulmonary fibrosis. There is evidence of this by chest x-ray in 2015 and she has some dry crackles on exam. I do not believe there is interstitial edema. She is also complaining now of right flank pain. She has some CVA  tenderness but has had no fever, chills or shaking chills. She recently had 2 UTIs apparently which eventually improved with Cipro. She denies any chest pain. ? ?05/22/2016 ? ?Kristin Shaffer returns today for follow-up. She underwent CT scan of the chest due to some interstitial changes on her chest x-ray and right flank/lower right posterior chest pain. She reports that this chest pain has improved and resolved although she still notes some shortness of breath with exertion. The CT scan shows a pattern of rather widespread groundglass attenuation most evident in the mid to lower lung fields. There are patchy areas of mild subpleural reticulation noted as well. This is concerning for either non-specific interstitial pneumonia or possibly IPF. Based on these findings, I would recommend a pulmonary evaluation. There is no evidence of a pneumonia. Her BNP is low at 40 and I did not suspect this is congestive heart failure or pulmonary edema. Finally she underwent abdominal ultrasound which demonstrated moderate aortoiliac atherosclerosis which will need follow-up annually. ? ?01/15/2018 ? ?Kristin Shaffer returns today for follow-up. She's recently had a number of medical problems include a hospitalization for intracerebral hemorrhage. This was a result of trauma falling down stairs. Fortunately this resolved completely.  She did see Dr. Curt Jews for possible nonspecific interstitial pneumonia but he feels that her radiographic findings an abnormal PFTs may be attributable to chronic aspiration with GERD. She does report shortness of breath with moderate exertion. Blood pressure today was initially elevated 188/71 however recheck was 120/68. She was switched from lisinopril to losartan for persistent cough however is noted no change. She is on over-the-counter H2 blocker for her reflux. ? ?09/28/2017 ? ?Kristin Shaffer was seen today in follow-up.  Overall she seems to be doing well.  She saw Dr. Lake Bells for pulmonary fibrosis.  He is  thought that this may be related to prior pesticide exposure or silent aspiration.  He does not feel like he has changed significantly and may not be IPF.  She denies any recurrent chest pain.  She is recovered

## 2022-02-17 NOTE — Patient Instructions (Addendum)
Medication Instructions:  ?Your physician recommends that you continue on your current medications as directed. Please refer to the Current Medication list given to you today. ? ?*If you need a refill on your cardiac medications before your next appointment, please call your pharmacy* ? ? ?Testing/Procedures: ?Dr. Debara Pickett has ordered an ultrasound study -- aorta/IVC/iliac ? ?This can be done at Dr. Lysbeth Penner office or [facility] ([address] [place], [place] [address] ? ? ? ?Follow-Up: ?At [facility], you and your health needs are our priority.  As part of our continuing mission to provide you with exceptional heart care, we have created designated Provider Care Teams.  These Care Teams include your primary Cardiologist (physician) and Advanced Practice Providers (APPs -  Physician Assistants and Nurse Practitioners) who all work together to provide you with the care you need, when you need it. ? ?We recommend signing up for the patient portal called "MyChart".  Sign up information is provided on this After Visit Summary.  MyChart is used to connect with patients for Virtual Visits (Telemedicine).  Patients are able to view lab/test results, encounter notes, upcoming appointments, etc.  Non-urgent messages can be sent to your provider as well.   ?To learn more about what you can do with MyChart, go to [email or web address].   ? ?Your next appointment:   ?12 month(s) ? ?The format for your next appointment:   ?In Person ? ?Provider:   ?Pixie Casino, MD { ? ? ?Other Instructions ? ? ?

## 2022-02-24 ENCOUNTER — Other Ambulatory Visit (HOSPITAL_BASED_OUTPATIENT_CLINIC_OR_DEPARTMENT_OTHER): Payer: Self-pay | Admitting: Internal Medicine

## 2022-02-24 DIAGNOSIS — I739 Peripheral vascular disease, unspecified: Secondary | ICD-10-CM

## 2022-02-27 ENCOUNTER — Ambulatory Visit (HOSPITAL_COMMUNITY): Payer: Medicare Other

## 2022-03-03 ENCOUNTER — Ambulatory Visit (INDEPENDENT_AMBULATORY_CARE_PROVIDER_SITE_OTHER): Payer: Medicare Other

## 2022-03-03 DIAGNOSIS — M79604 Pain in right leg: Secondary | ICD-10-CM

## 2022-03-03 DIAGNOSIS — M79605 Pain in left leg: Secondary | ICD-10-CM

## 2022-03-03 DIAGNOSIS — I739 Peripheral vascular disease, unspecified: Secondary | ICD-10-CM | POA: Diagnosis not present

## 2022-03-09 ENCOUNTER — Telehealth: Payer: Self-pay | Admitting: Internal Medicine

## 2022-03-09 NOTE — Telephone Encounter (Signed)
Pt informed of providers result & recommendations. Pt verbalized understanding. Informed Dr Debara Pickett is out of town thru Monday. She will await his return. No further questions. She is wondering what to do next because of her leg pain she "can barely walk" please advise next steps for her pain. She will call PCP as well.  ?

## 2022-03-09 NOTE — Telephone Encounter (Signed)
Follow Up: ? ? ? ?Patient would like to know the results from her doppler on 03-03-22 ?

## 2022-03-13 NOTE — Telephone Encounter (Signed)
The LE arterial dopplers do not explain her leg pain - I would advise PCP follow-up or referral from PCP to orthopedics. ? ?Dr Lemmie Evens ?

## 2022-03-14 NOTE — Telephone Encounter (Signed)
Pt informed of providers recommendations. Pt verbalized understanding. Forwarded to PCP via EPIC fax function ?She will call PCP to discuss ? ?

## 2022-03-20 ENCOUNTER — Ambulatory Visit: Payer: Medicare Other | Admitting: Pulmonary Disease

## 2022-03-21 ENCOUNTER — Telehealth: Payer: Self-pay | Admitting: Internal Medicine

## 2022-03-21 DIAGNOSIS — M79606 Pain in leg, unspecified: Secondary | ICD-10-CM

## 2022-03-21 DIAGNOSIS — Q251 Coarctation of aorta: Secondary | ICD-10-CM

## 2022-03-21 NOTE — Telephone Encounter (Signed)
?  Pt is returning call regarding his aorta result ?

## 2022-03-21 NOTE — Telephone Encounter (Signed)
Spoke to patient results of aorta US given with Dr.Hilty's recommendation.Stated she does not want any more test done.She would like to talk  with Dr.Hilty.Advised I will send message to Dr.Hilty. ?

## 2022-03-22 NOTE — Telephone Encounter (Signed)
Spoke with Ms. Buttery - she is agreeable to the contrast CT aorta abdomen/pelvis with runoff to look for distal aortic obstruction - please order. ? ?Dr. Lemmie Evens ?

## 2022-03-23 ENCOUNTER — Other Ambulatory Visit: Payer: Self-pay | Admitting: *Deleted

## 2022-03-23 NOTE — Telephone Encounter (Signed)
CT test + BMET ordered ?Staff message to scheduling team to assist with appointment ?

## 2022-03-24 NOTE — Telephone Encounter (Signed)
CT test 04/18/22 ?

## 2022-04-02 ENCOUNTER — Other Ambulatory Visit: Payer: Self-pay | Admitting: Internal Medicine

## 2022-04-13 ENCOUNTER — Other Ambulatory Visit: Payer: Self-pay

## 2022-04-13 MED ORDER — EZETIMIBE 10 MG PO TABS: 10.0000 mg | ORAL_TABLET | Freq: Every day | ORAL | 1 refills | Status: DC

## 2022-04-18 ENCOUNTER — Inpatient Hospital Stay: Admission: RE | Admit: 2022-04-18 | Payer: Medicare Other | Source: Ambulatory Visit

## 2022-04-18 ENCOUNTER — Ambulatory Visit
Admission: RE | Admit: 2022-04-18 | Discharge: 2022-04-18 | Disposition: A | Discharge status: Home / Self Care | Payer: Medicare Other | Source: Ambulatory Visit | Attending: Internal Medicine | Admitting: Internal Medicine

## 2022-04-18 DIAGNOSIS — Q251 Coarctation of aorta: Secondary | ICD-10-CM

## 2022-04-18 DIAGNOSIS — M79606 Pain in leg, unspecified: Secondary | ICD-10-CM

## 2022-04-18 MED ORDER — IOPAMIDOL (ISOVUE-370) INJECTION 76%
100.0000 mL | Freq: Once | INTRAVENOUS | Status: AC | PRN
  Administered 2022-04-18: 100 mL via INTRAVENOUS

## 2022-04-21 ENCOUNTER — Telehealth: Payer: Self-pay | Admitting: Internal Medicine

## 2022-04-21 DIAGNOSIS — I739 Peripheral vascular disease, unspecified: Secondary | ICD-10-CM

## 2022-04-21 DIAGNOSIS — Q251 Coarctation of aorta: Secondary | ICD-10-CM

## 2022-04-21 NOTE — Telephone Encounter (Signed)
Follow Up:    Patient wants to know if her CT results are ready from Tuesday(04-18-22)

## 2022-04-21 NOTE — Telephone Encounter (Signed)
Kristin Casino, MD  04/20/2022  3:22 PM EDT     Please refer to Dr. Gwenlyn Found to evaluate left SFA stenosis in the setting of claudication for possible intervention.   Dr Magdalen Spatz patient and informed her that Dr. Debara Pickett wants her to be referred to Dr. Gwenlyn Found due to her results. Placed order for referral and sent message to [facility] to schedule.

## 2022-04-26 ENCOUNTER — Telehealth: Payer: Self-pay | Admitting: Internal Medicine

## 2022-04-26 NOTE — Telephone Encounter (Signed)
Returned call to pt discussed referral, referral process. Scheduled appt for 05-16-22 @ 2pm. She will arrive early, verbalized understanding. She was questioning "stents" and acqutiy. Informed pt that this is an outpatient procedure and to discuss this at scheduled appointment, verbalized understanding.

## 2022-04-26 NOTE — Telephone Encounter (Signed)
Follow Up:      Patient is calling to find out her CT results from 04-18-22. She said her phone was out of order all last week.

## 2022-05-16 ENCOUNTER — Encounter: Payer: Self-pay | Admitting: Cardiovascular Disease

## 2022-05-16 ENCOUNTER — Ambulatory Visit (INDEPENDENT_AMBULATORY_CARE_PROVIDER_SITE_OTHER): Payer: Medicare Other | Admitting: Cardiovascular Disease

## 2022-05-16 DIAGNOSIS — I739 Peripheral vascular disease, unspecified: Secondary | ICD-10-CM

## 2022-05-16 NOTE — Patient Instructions (Signed)
Medication Instructions:  Your physician recommends that you continue on your current medications as directed. Please refer to the Current Medication list given to you today.  *If you need a refill on your cardiac medications before your next appointment, please call your pharmacy*   Testing/Procedures: Your physician has requested that you have a lower extremity arterial duplex. This test is an ultrasound of the arteries in the legs. It looks at arterial blood flow in the legs. Allow one hour for Lower Arterial scans. There are no restrictions or special instructions. This procedure will be done at [facility] 2nd [facility]    Follow-Up: At [facility], you and your health needs are our priority.  As part of our continuing mission to provide you with exceptional heart care, we have created designated Provider Care Teams.  These Care Teams include your primary Cardiologist (physician) and Advanced Practice Providers (APPs -  Physician Assistants and Nurse Practitioners) who all work together to provide you with the care you need, when you need it.  We recommend signing up for the patient portal called "MyChart".  Sign up information is provided on this After Visit Summary.  MyChart is used to connect with patients for Virtual Visits (Telemedicine).  Patients are able to view lab/test results, encounter notes, upcoming appointments, etc.  Non-urgent messages can be sent to your provider as well.   To learn more about what you can do with MyChart, go to [email or web address].    Your next appointment:   We will see you on an as needed basis.  Provider:   Quay Burow, MD

## 2022-05-16 NOTE — Assessment & Plan Note (Signed)
Kristin Shaffer was referred to me by Dr. Debara Pickett for dilation of symptomatic lower extremity discomfort thought to be claudication.  She does have a history of CAD as well as other cardiovascular risk factors.  She began to notice pain in her legs approxi-1 year and 1/2 years ago.  She also notices some instability on her feet requiring a walker.  She has pain when she walks short distances.  She did have aortoiliac Doppler studies in our office on/7/23 revealing a right ABI of 0.80 and left of 0.70 with no significant evidence of aortoiliac disease.  She has 2+ pedal pulses on exam.  I am not convinced that her symptoms are true claudication.  I am going to get lower extremity arterial Doppler studies to further evaluate.

## 2022-05-16 NOTE — Progress Notes (Signed)
Shortly    05/16/2022 Kristin Shaffer   22-Apr-1931  [id number]  Primary Physician Nickola Major, MD Primary Cardiologist: Lorretta Harp MD Gar[address], [place], [place]  HPI:  Kristin Shaffer is a 88 y.o. mild to moderately overweight widowed Caucasian female mother of 2 children, grandmother of 4 grandchildren who is accompanied by her son Kristin Shaffer today.  She was referred by her primary cardiologist, Dr. Debara Pickett for symptomatic lower extremity discomfort thought to be related to claudication.  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  Her risk factors include treated hypertension and hyperlipidemia.  Her father did have a myocardial infarction in her sister who is 2 years younger than her recently had CABG.  She had a myocardial infarction in 2014 and had RCA stenting with reintervention for "in-stent restenosis".  She is minimally ambulatory, lives alone and drives short distances.  She says that her legs began to hurt her about a year and a half ago and she points to her hips and knees.  She did have Doppler studies performed in our office/7/23 revealing a right ABI of 0.80, left 0.70 without evidence of significant aortoiliac disease.  Current Meds  Medication Sig   aspirin EC 81 MG tablet Take 81 mg by mouth daily.   atorvastatin (LIPITOR) 20 MG tablet Take by mouth.   CALCIUM-VITAMIN D PO Take 1 tablet by mouth daily.   chlorthalidone (HYGROTON) 25 MG tablet TAKE 1 TABLET(25 MG) BY MOUTH DAILY FOR BLOOD PRESSURE   clopidogrel (PLAVIX) 75 MG tablet TAKE 1 TABLET EVERY DAY   ezetimibe (ZETIA) 10 MG tablet Take 1 tablet (10 mg total) by mouth daily.   levothyroxine (SYNTHROID) 137 MCG tablet Take by mouth.   losartan (COZAAR) 50 MG tablet Take 1 tablet (50 mg total) by mouth daily. Keep upcoming appointment   Magnesium Oxide 250 MG TABS Take 1 tablet by mouth daily.   metoprolol tartrate (LOPRESSOR) 25 MG tablet TAKE 1/2 TABLET TWICE DAILY   Multiple  Vitamins-Minerals (MULTIVITAMIN ADULT PO) Take by mouth.   nitroGLYCERIN (NITROSTAT) 0.4 MG SL tablet Place 1 tablet (0.4 mg total) under the tongue every 5 (five) minutes as needed for chest pain.   triamcinolone cream (KENALOG) 0.1 % Apply topically.     Allergies  Allergen Reactions   Hydrocodone-Acetaminophen Nausea And Vomiting   Omeprazole Other (See Comments)    Made acid reflux worse   Zocor [Simvastatin] Other (See Comments)    Trouble Walking, Leg weakness and cramping   Latex Hives and Rash    Social History   Socioeconomic History   Marital status: Widowed    Spouse name: Not on file   Number of children: Not on file   Years of education: Not on file   Highest education Shaffer: Not on file  Occupational History   Occupation: retired   Tobacco Use   Smoking status: Never   Smokeless tobacco: Never  Substance and Sexual Activity   Alcohol use: No    Alcohol/week: 0.0 standard drinks of alcohol   Drug use: No   Sexual activity: Never  Other Topics Concern   Not on file  Social History Narrative   Lives alone.    Social Determinants of Health   Financial Resource Strain: Not on file  Food Insecurity: Not on file  Transportation Needs: Not on file  Physical Activity: Not on file  Stress: Not on file  Social Connections: Not on file  Intimate Partner  Violence: Not on file     Review of Systems: General: negative for chills, fever, night sweats or weight changes.  Cardiovascular: negative for chest pain, dyspnea on exertion, edema, orthopnea, palpitations, paroxysmal nocturnal dyspnea or shortness of breath Dermatological: negative for rash Respiratory: negative for cough or wheezing Urologic: negative for hematuria Abdominal: negative for nausea, vomiting, diarrhea, bright red blood per rectum, melena, or hematemesis Neurologic: negative for visual changes, syncope, or dizziness All other systems reviewed and are otherwise negative except as noted  above.    Blood pressure 128/66, pulse 62, height '5\' 3"'$  (1.6 m), weight 176 lb 12.8 oz (80.2 kg), SpO2 96 %.  General appearance: alert and no distress Neck: no adenopathy, no carotid bruit, no JVD, supple, symmetrical, trachea midline, and thyroid not enlarged, symmetric, no tenderness/mass/nodules Lungs: clear to auscultation bilaterally Heart: regular rate and rhythm, S1, S2 normal, no murmur, click, rub or gallop Extremities: extremities normal, atraumatic, no cyanosis or edema Pulses: 2+ and symmetric Skin: Skin color, texture, turgor normal. No rashes or lesions Neurologic: Grossly normal  EKG sinus rhythm at 62 with LVH voltage and Q waves in lead III with early R wave transition.  She had nonspecific ST and T wave changes.  I personally reviewed this EKG.  ASSESSMENT AND PLAN:   Peripheral arterial disease ([place]) Ms. Hietpas was referred to me by Dr. Debara Pickett for dilation of symptomatic lower extremity discomfort thought to be claudication.  She does have a history of CAD as well as other cardiovascular risk factors.  She began to notice pain in her legs approxi-1 year and 1/2 years ago.  She also notices some instability on her feet requiring a walker.  She has pain when she walks short distances.  She did have aortoiliac Doppler studies in our office on/7/23 revealing a right ABI of 0.80 and left of 0.70 with no significant evidence of aortoiliac disease.  She has 2+ pedal pulses on exam.  I am not convinced that her symptoms are true claudication.  I am going to get lower extremity arterial Doppler studies to further evaluate.     Lorretta Harp MD FACP,FACC,FAHA, [facility] 05/16/2022 2:07 PM

## 2022-05-17 NOTE — Addendum Note (Signed)
Addended by: Merri Ray A on: 05/17/2022 01:57 PM   Modules accepted: Orders

## 2022-05-18 ENCOUNTER — Ambulatory Visit (INDEPENDENT_AMBULATORY_CARE_PROVIDER_SITE_OTHER): Payer: Medicare Other | Admitting: Pulmonary Disease

## 2022-05-18 ENCOUNTER — Encounter: Payer: Self-pay | Admitting: Pulmonary Disease

## 2022-05-18 VITALS — BP 140/60 | HR 64 | Temp 97.6°F | Ht 64.5 in | Wt 177.4 lb

## 2022-05-18 DIAGNOSIS — I2510 Atherosclerotic heart disease of native coronary artery without angina pectoris: Secondary | ICD-10-CM

## 2022-05-18 DIAGNOSIS — J849 Interstitial pulmonary disease, unspecified: Secondary | ICD-10-CM

## 2022-05-18 LAB — PULMONARY FUNCTION TEST
DL/VA % pred: 89 %
DL/VA: 3.58 ml/min/mmHg/L
DLCO cor % pred: 73 %
DLCO cor: 13.69 ml/min/mmHg
DLCO unc % pred: 73 %
DLCO unc: 13.69 ml/min/mmHg
FEF 25-75 Post: 2.99 L/sec
FEF 25-75 Pre: 2.95 L/sec
FEF2575-%Change-Post: 1 %
FEF2575-%Pred-Post: 310 %
FEF2575-%Pred-Pre: 306 %
FEV1-%Change-Post: 1 %
FEV1-%Pred-Post: 111 %
FEV1-%Pred-Pre: 109 %
FEV1-Post: 1.85 L
FEV1-Pre: 1.83 L
FEV1FVC-%Change-Post: 0 %
FEV1FVC-%Pred-Pre: 129 %
FEV6-%Change-Post: 2 %
FEV6-%Pred-Post: 95 %
FEV6-%Pred-Pre: 93 %
FEV6-Post: 2.01 L
FEV6-Pre: 1.96 L
FEV6FVC-%Pred-Post: 107 %
FEV6FVC-%Pred-Pre: 107 %
FVC-%Change-Post: 2 %
FVC-%Pred-Post: 88 %
FVC-%Pred-Pre: 86 %
FVC-Post: 2.01 L
FVC-Pre: 1.96 L
Post FEV1/FVC ratio: 92 %
Post FEV6/FVC ratio: 100 %
Pre FEV1/FVC ratio: 93 %
Pre FEV6/FVC Ratio: 100 %
RV % pred: 84 %
RV: 2.2 L
TLC % pred: 84 %
TLC: 4.31 L

## 2022-05-18 NOTE — Progress Notes (Signed)
Full PFT Performed Today  

## 2022-05-18 NOTE — Patient Instructions (Signed)
Full PFT Performed Today  

## 2022-05-18 NOTE — Patient Instructions (Signed)
Your CT and PFTs are largely stable.  Since it is mild disease we will continue to monitor without aggressive work-up I will advise you to get rid of your down comforter We will check labs today including ANA, CCP, rheumatoid factor and hypersensitivity panel Follow-up in 90-month

## 2022-05-22 LAB — CYCLIC CITRUL PEPTIDE ANTIBODY, IGG: Cyclic Citrullin Peptide Ab: <16 UNITS

## 2022-05-22 LAB — RHEUMATOID FACTOR: Rheumatoid fact SerPl-aCnc: <14 IU/mL (ref <14)

## 2022-05-22 LAB — ANTI-NUCLEAR AB-TITER (ANA TITER): ANA Titer 1: 1:40 {titer} — ABNORMAL HIGH

## 2022-05-22 LAB — ANA: Anti Nuclear Antibody (ANA): POSITIVE — AB

## 2022-05-24 LAB — HYPERSENSITIVITY PNEUMONITIS
A. Pullulans Abs: NEGATIVE
A.Fumigatus #1 Abs: NEGATIVE
Micropolyspora faeni, IgG: NEGATIVE
Pigeon Serum Abs: NEGATIVE
Thermoact. Saccharii: NEGATIVE
Thermoactinomyces vulgaris, IgG: NEGATIVE

## 2022-05-29 ENCOUNTER — Other Ambulatory Visit: Payer: Self-pay | Admitting: Internal Medicine

## 2022-06-06 ENCOUNTER — Ambulatory Visit (INDEPENDENT_AMBULATORY_CARE_PROVIDER_SITE_OTHER): Payer: Medicare Other

## 2022-06-06 DIAGNOSIS — I739 Peripheral vascular disease, unspecified: Secondary | ICD-10-CM

## 2022-06-12 ENCOUNTER — Telehealth: Payer: Self-pay | Admitting: Cardiovascular Disease

## 2022-06-12 NOTE — Telephone Encounter (Signed)
Patient called to follow-up on results of testing.

## 2022-06-12 NOTE — Telephone Encounter (Signed)
Lorretta Harp, MD  06/06/2022  8:02 PM EDT     Return office visit with me to discuss results at next available     Discussed results with patient, appt scheduled 8/2 with Almyra Deforest PA to discuss.

## 2022-06-28 ENCOUNTER — Ambulatory Visit (INDEPENDENT_AMBULATORY_CARE_PROVIDER_SITE_OTHER): Payer: Medicare Other | Admitting: Physician Assistant

## 2022-06-28 ENCOUNTER — Encounter: Payer: Self-pay | Admitting: Physician Assistant

## 2022-06-28 VITALS — BP 142/52 | HR 54 | Ht 63.0 in | Wt 177.2 lb

## 2022-06-28 DIAGNOSIS — I2510 Atherosclerotic heart disease of native coronary artery without angina pectoris: Secondary | ICD-10-CM

## 2022-06-28 DIAGNOSIS — J8410 Pulmonary fibrosis, unspecified: Secondary | ICD-10-CM

## 2022-06-28 DIAGNOSIS — E785 Hyperlipidemia, unspecified: Secondary | ICD-10-CM

## 2022-06-28 DIAGNOSIS — I10 Essential (primary) hypertension: Secondary | ICD-10-CM | POA: Diagnosis not present

## 2022-06-28 DIAGNOSIS — I739 Peripheral vascular disease, unspecified: Secondary | ICD-10-CM | POA: Diagnosis not present

## 2022-06-28 MED ORDER — CILOSTAZOL 50 MG PO TABS: 50.0000 mg | ORAL_TABLET | Freq: Two times a day (BID) | ORAL | 3 refills | Status: DC

## 2022-06-28 NOTE — Progress Notes (Unsigned)
Cardiology Office Note:    Date:  06/29/2022   ID:  Kristin Shaffer, DOB 05/24/1931, MRN [id number]  PCP:  Nickola Major, MD   [place] Prov[facility] Cardiologist:  Pixie Casino, MD     Referring MD: Nickola Major, MD   Chief Complaint  Patient presents with   Follow-up    Seen for Dr. Debara Pickett    History of Present Illness:    Kristin Shaffer is a 88 y.o. female with a hx of hypertension, hyperlipidemia, hypothyroidism, pulmonary fibrosis, CAD and PAD.  She has significant family history of CAD.  She had a myocardial infarction in 2014 and had 3 overlapping stents placed.  Postprocedure, she was discharged on aspirin and Brilinta.  Cardiac catheterization performed in January 2015 revealed 99% in-stent restenosis in the proximal RCA stent treated with balloon angioplasty.    Recent high-resolution CT of chest obtained on 02/07/2022 showed mild pulmonary fibrosis.  CT angio of abdomen and bifemoral showed a focal high-grade stenosis in the left distal superficial femoral artery, 40% narrowing in the infrarenal abdominal aorta, cholelithiasis, age-indeterminate L1 vertebral body fracture with approximately 20% height loss, colonic diverticular disease without active inflammation, multilevel degenerative disc disease and extensive groundglass attenuation airspace opacities throughout the lower lungs.  Recent PFT obtained on 05/18/2022 was normal.  She is minimally ambulatory, lives alone and drives short distance.  She was seen by Dr. Debara Pickett who was concerned of claudication symptoms and referred to Dr. Alvester Chou for further evaluation.  She was recently seen by Dr. Alvester Chou on 05/16/2022 due to leg pain and hip pain.  Subsequent ABI was abnormal revealing mostly triphasic and biphasic waveform with a minimal plaque in the right lower extremity, monophasic waveform in the left common femoral artery, monophasic waveform in the distal left SFA with 75 to 99% lesion, monophasic waveforms in  proximal to distal popliteal artery, proximal left anterior tibial artery, and entire left peroneal artery.   Patient presents today accompanied by her son Darnell Level to discuss her recent finding.  Talking with the patient, she is quite sedentary and only walks short distance with her walker.  Total walking distance throughout the entire day is usually less than 50 yards even counting the distance she walked to the church.  She says she will wake up with bilateral upper and lower leg pain.  She has to take 2 Tylenol in the morning before she can start walking.  Her leg pain is worse in the right lower extremity compared to the left lower extremity.  She has bilateral knee pain.  She cannot walk long distance before she has to stop.  She has no open ulcers and no discoloration in the lower extremity.  We discussed signs and symptoms of claudication.  Her symptom is somewhat atypical given the fact that she has more pain in the right lower extremity compared to the left side.  However recent Doppler suggest she has mild plaque in the right lower extremity and severe disease in the left lower extremity.  Her pain actually starts in the morning as soon as she stands up before she even begin to walk.  She has pain whenever she tries to lift up her legs.  I suspect a lot of her pain is more arthritic in nature rather than vascular.  We discussed signs and symptom of critical limb ischemia including significant skin discoloration, pain out of proportion, open ulcers, and gangrene in the toes.  She is aware to  seek urgent medical attention if she does develop those signs and symptoms.  However given her advanced age, lack of significant functional ability at baseline, and atypical nature of lower extremity pain, I did not think lower extremity angiography intervention is current give her significant benefit.  I do not think intervention will improve her functional ability.  I discussed the case with Dr. Gwenlyn Found who agrees with  my assessment.  We will start the patient on course of Pletal 50 mg twice a day for PAD.  In the future, we can also consider discontinuing the Plavix and give her 2.5 mg Xarelto. (Compass trial showing benefit of combination of ASA and low dose Xarelto in PAD patients)   Per patient's request, we will forward her recent work-up to her PCP.   Past Medical History:  Diagnosis Date   Anxiety    Arthritis    "different places; not bad" (03/20/2013)   Coronary artery disease    Exertional shortness of breath    GERD (gastroesophageal reflux disease)    High cholesterol    Hypertension    Hypothyroidism    Myocardial infarction [facility])    "Dr's saw evidence I might have had a heart attack" (03/20/2013)   NSTEMI (non-ST elevated myocardial infarction) ([place]) 03/20/2013   cath - mid RCA   Pneumonia 2012   Skin cancer    "both legs; right arm" (03/20/2013)    Past Surgical History:  Procedure Laterality Date   APPENDECTOMY  2003   BREAST BIOPSY Bilateral 1990s   "total of 5; 2 on one side, 3 on the other; all benign" (03/20/2013)   CATARACT EXTRACTION W/ INTRAOCULAR LENS  IMPLANT, BILATERAL Bilateral ~ 2008   CORONARY ANGIOPLASTY  12/08/2013   CORONARY ANGIOPLASTY WITH STENT PLACEMENT  03/20/2013   NSTEMI - subtotal occlusion of mid RCA - PCI of mid RCA of long tubular 40-60% lesion - 2 tandem 95-99% subtotal occlusions - 3 overlapping Xience Xpedition DES (Dr. Roni Bread)    [place]?   LACERATION REPAIR Right 03/24/2019   Procedure: COMPLEX FACIAL LACERATIONS;  Surgeon: Jerrell Belfast, MD;  Location: [facility];  Service: ENT;  Laterality: Right;   LEFT HEART CATHETERIZATION WITH CORONARY ANGIOGRAM N/A 03/20/2013   Procedure: LEFT HEART CATHETERIZATION WITH CORONARY ANGIOGRAM;  Surgeon: Sanda Klein, MD;  Location: [facility] CATH LAB;  Service: Cardiovascular;  Laterality: N/A;   LEFT HEART CATHETERIZATION WITH CORONARY ANGIOGRAM N/A 12/08/2013   Procedure: LEFT HEART  CATHETERIZATION WITH CORONARY ANGIOGRAM;  Surgeon: Blane Ohara, MD;  Location: [facility] CATH LAB;  Service: Cardiovascular;  Laterality: N/A;   SKIN CANCER EXCISION     "1 off right arm; 2 off each leg" (03/20/2013)   TRANSTHORACIC ECHOCARDIOGRAM  03/2013   EF 52-77%, grade 1 diastolic dysfunction; mild MR; calcifed MV annulus; LA mildly dilated    Current Medications: Current Meds  Medication Sig   aspirin EC 81 MG tablet Take 81 mg by mouth daily.   atorvastatin (LIPITOR) 20 MG tablet Take by mouth.   CALCIUM-VITAMIN D PO Take 1 tablet by mouth daily.   chlorthalidone (HYGROTON) 25 MG tablet TAKE 1 TABLET(25 MG) BY MOUTH DAILY FOR BLOOD PRESSURE   cilostazol (PLETAL) 50 MG tablet Take 1 tablet (50 mg total) by mouth 2 (two) times daily.   clopidogrel (PLAVIX) 75 MG tablet TAKE 1 TABLET EVERY DAY   ezetimibe (ZETIA) 10 MG tablet Take 1 tablet (10 mg total) by mouth daily.   levothyroxine (SYNTHROID)  137 MCG tablet Take by mouth.   losartan (COZAAR) 50 MG tablet Take 1 tablet (50 mg total) by mouth daily.   Magnesium Oxide 250 MG TABS Take 1 tablet by mouth daily.   metoprolol tartrate (LOPRESSOR) 25 MG tablet TAKE 1/2 TABLET TWICE DAILY   Multiple Vitamins-Minerals (MULTIVITAMIN ADULT PO) Take by mouth.   nitroGLYCERIN (NITROSTAT) 0.4 MG SL tablet Place 1 tablet (0.4 mg total) under the tongue every 5 (five) minutes as needed for chest pain.   triamcinolone cream (KENALOG) 0.1 % Apply topically.     Allergies:   Hydrocodone-acetaminophen, Omeprazole, Zocor [simvastatin], and Latex   Social History   Socioeconomic History   Marital status: Widowed    Spouse name: Not on file   Number of children: Not on file   Years of education: Not on file   Highest education level: Not on file  Occupational History   Occupation: retired   Tobacco Use   Smoking status: Never   Smokeless tobacco: Never  Substance and Sexual Activity   Alcohol use: No    Alcohol/week: 0.0 standard drinks of  alcohol   Drug use: No   Sexual activity: Never  Other Topics Concern   Not on file  Social History Narrative   Lives alone.    Social Determinants of Health   Financial Resource Strain: Not on file  Food Insecurity: Not on file  Transportation Needs: Not on file  Physical Activity: Not on file  Stress: Not on file  Social Connections: Not on file     Family History: The patient's family history includes CAD (age of onset: 28) in her father; Cancer in her brother; Dementia in her mother; Lung cancer in her father.  ROS:   Please see the history of present illness.     All other systems reviewed and are negative.  EKGs/Labs/Other Studies Reviewed:    The following studies were reviewed today:  Echo 09/11/2016 LV EF: 65% -   70%   -------------------------------------------------------------------  Indications:      Syncope 780.2.   -------------------------------------------------------------------  History:   PMH:   Dyspnea.  Coronary artery disease.  PMH:  Myocardial infarction.  Risk factors:  Hypertension.   -------------------------------------------------------------------  Study Conclusions   - Left ventricle: Hyperdynamic LV witth small mid cavitary gradient    The cavity size was normal. Wall thickness was normal. Systolic    function was vigorous. The estimated ejection fraction was in the    range of 65% to 70%. Wall motion was normal; there were no    regional wall motion abnormalities. Doppler parameters are    consistent with both elevated ventricular end-diastolic filling    pressure and elevated left atrial filling pressure.  - Aortic valve: There was trivial regurgitation.  - Mitral valve: Calcified annulus.  - Atrial septum: No defect or patent foramen ovale was identified.  - Pulmonary arteries: PA peak pressure: 37 mm Hg (S).   EKG:  EKG is not ordered today.    Recent Labs: No results found for requested labs within last 365 days.  Recent  Lipid Panel    Component Value Date/Time   CHOL 175 12/27/2020 1531   CHOL 249 (H) 04/27/2015 0917   TRIG 154 (H) 12/27/2020 1531   TRIG 147 04/27/2015 0917   HDL 59 12/27/2020 1531   HDL 64 04/27/2015 0917   CHOLHDL 3.0 12/27/2020 1531   CHOLHDL 4.0 03/21/2013 0615   VLDL 35 03/21/2013 0615   LDLCALC 90 12/27/2020 1531  LDLCALC 156 (H) 04/27/2015 0917     Risk Assessment/Calculations:           Physical Exam:    VS:  BP (!) 142/52   Pulse (!) 54   Ht '5\' 3"'$  (1.6 m)   Wt 177 lb 3.2 oz (80.4 kg)   SpO2 97%   BMI 31.39 kg/m        Wt Readings from Last 3 Encounters:  06/28/22 177 lb 3.2 oz (80.4 kg)  05/18/22 177 lb 6.4 oz (80.5 kg)  05/16/22 176 lb 12.8 oz (80.2 kg)     GEN:  Well nourished, well developed in no acute distress HEENT: Normal NECK: No JVD; No carotid bruits LYMPHATICS: No lymphadenopathy CARDIAC: RRR, no murmurs, rubs, gallops RESPIRATORY:  Clear to auscultation without rales, wheezing or rhonchi  ABDOMEN: Soft, non-tender, non-distended MUSCULOSKELETAL:  No edema; No deformity  SKIN: Warm and dry NEUROLOGIC:  Alert and oriented x 3 PSYCHIATRIC:  Normal affect   ASSESSMENT:    1. PAD (peripheral artery disease) ([place])   2. Coronary artery disease involving native coronary artery of native heart without angina pectoris   3. Essential hypertension   4. Hyperlipidemia LDL goal <70   5. Pulmonary fibrosis (HCC)    PLAN:    In order of problems listed above:  PAD: Recent arterial Doppler demonstrated significant disease in the left SFA, proximal anterior tibial artery and peroneal artery.  Patient presents today to discuss lower extremity disease to see if she may benefit from lower extremity angiography.  Talking with the patient, her functional ability is extremely limited.  Even with her going to church, her daily walking distance is less than 50 yards.  Her symptom is also quite atypical, she complains more of right lower extremity pain  rather than left lower extremity pain.  Even though arterial Doppler showed she had only mild plaque in the right lower extremity.  This is inconsistent with her symptoms.  Given her advanced age, limited functional ability, and atypical symptom, I am not confident that lower extremity angiography will really improve her overall quality of life.  I discussed the case with Dr. Gwenlyn Found, we will start a trial of medical therapy with additional Pletal 50 mg twice a day.  CAD: Denies any recent chest pain  Hypertension: Blood pressure stable  Hyperlipidemia: On Lipitor  Pulmonary fibrosis: Despite prior history of pulmonary fibrosis seen on high-resolution CT of the chest, however her PFT was completely normal.             Medication Adjustments/Labs and Tests Ordered: Current medicines are reviewed at length with the patient today.  Concerns regarding medicines are outlined above.  No orders of the defined types were placed in this encounter.  Meds ordered this encounter  Medications   cilostazol (PLETAL) 50 MG tablet    Sig: Take 1 tablet (50 mg total) by mouth 2 (two) times daily.    Dispense:  90 tablet    Refill:  3    Patient Instructions  Medication Instructions:  START PLETAL 50 mg take 1 tablet twice at day.  *If you need a refill on your cardiac medications before your next appointment, please call your pharmacy*  Follow-Up: At [facility], you and your health needs are our priority.  As part of our continuing mission to provide you with exceptional heart care, we have created designated Provider Care Teams.  These Care Teams include your primary Cardiologist (physician) and Advanced Practice Providers (APPs -  Physician Assistants and Nurse Practitioners) who all work together to provide you with the care you need, when you need it.  We recommend signing up for the patient portal called "MyChart".  Sign up information is provided on this After Visit Summary.  MyChart is  used to connect with patients for Virtual Visits (Telemedicine).  Patients are able to view lab/test results, encounter notes, upcoming appointments, etc.  Non-urgent messages can be sent to your provider as well.   To learn more about what you can do with MyChart, go to [email or web address].    Your next appointment:   7 month(s)  The format for your next appointment:   In Person  Provider:   Pixie Casino, MD {  Important Information About Sugar         Hilbert Corrigan, [place]  06/29/2022 4:31 PM    [place]

## 2022-06-28 NOTE — Patient Instructions (Addendum)
Medication Instructions:  START PLETAL 50 mg take 1 tablet twice at day.  *If you need a refill on your cardiac medications before your next appointment, please call your pharmacy*  Follow-Up: At [facility], you and your health needs are our priority.  As part of our continuing mission to provide you with exceptional heart care, we have created designated Provider Care Teams.  These Care Teams include your primary Cardiologist (physician) and Advanced Practice Providers (APPs -  Physician Assistants and Nurse Practitioners) who all work together to provide you with the care you need, when you need it.  We recommend signing up for the patient portal called "MyChart".  Sign up information is provided on this After Visit Summary.  MyChart is used to connect with patients for Virtual Visits (Telemedicine).  Patients are able to view lab/test results, encounter notes, upcoming appointments, etc.  Non-urgent messages can be sent to your provider as well.   To learn more about what you can do with MyChart, go to [email or web address].    Your next appointment:   7 month(s)  The format for your next appointment:   In Person  Provider:   Pixie Casino, MD {  Important Information About [place]

## 2022-06-28 NOTE — Telephone Encounter (Signed)
This encounter was created in error - please disregard.

## 2022-06-29 ENCOUNTER — Telehealth: Payer: Self-pay | Admitting: Cardiovascular Disease

## 2022-06-29 ENCOUNTER — Encounter: Payer: Self-pay | Admitting: Physician Assistant

## 2022-06-29 NOTE — Telephone Encounter (Signed)
Patient called stating she was returning a call about her medication change.

## 2022-06-29 NOTE — Telephone Encounter (Signed)
Returned the call to the patient. She stated that she was returning a phone call. Did not see where anyone had called her.

## 2022-08-03 ENCOUNTER — Telehealth: Payer: Self-pay | Admitting: Cardiovascular Disease

## 2022-08-03 NOTE — Telephone Encounter (Signed)
Pt c/o medication issue:  1. Name of Medication: Cilostazol  2. How are you currently taking this medication (dosage and times per day)?   2 times a day 3. Are you having a reaction (difficulty breathing--STAT)?   4. What is your medication issue? Patient says she have taken it for a month and it have not helped at all- she wants to try something else

## 2022-08-03 NOTE — Telephone Encounter (Signed)
Returned call to pt she states that her Pletal is not helping at all. She cannot tell any differences. She is still having problems getting up and down, she cannot walk very far. She would like something else if there is anything. She will continue for now. Please advise.

## 2022-08-04 NOTE — Telephone Encounter (Signed)
Returned call to pt, informed pt of Berry's comment. She states that she has been taking medication for "1 month and 6 days". Scheduled appt 09-18-22, she states that she cannot make "just any appt because she does not have transportation". Informed pt that this is over a month away and she can call back and cancel but if she calls back later her appt will be further in the future. Verbalized understanding. She will discuss with her son and call back if she needs to cancel.

## 2022-08-15 ENCOUNTER — Other Ambulatory Visit (HOSPITAL_BASED_OUTPATIENT_CLINIC_OR_DEPARTMENT_OTHER): Payer: Self-pay | Admitting: Cardiovascular Disease

## 2022-08-15 DIAGNOSIS — I739 Peripheral vascular disease, unspecified: Secondary | ICD-10-CM

## 2022-09-04 ENCOUNTER — Other Ambulatory Visit: Payer: Self-pay | Admitting: Internal Medicine

## 2022-09-18 ENCOUNTER — Ambulatory Visit: Payer: Medicare Other | Admitting: Cardiovascular Disease

## 2022-11-09 ENCOUNTER — Other Ambulatory Visit: Payer: Self-pay | Admitting: Internal Medicine

## 2022-11-14 ENCOUNTER — Ambulatory Visit: Payer: Medicare Other | Admitting: Cardiovascular Disease

## 2022-11-29 ENCOUNTER — Other Ambulatory Visit: Payer: Self-pay | Admitting: Physician Assistant

## 2023-01-02 ENCOUNTER — Telehealth: Payer: Self-pay | Admitting: Internal Medicine

## 2023-01-02 NOTE — Telephone Encounter (Signed)
*  STAT* If patient is at the pharmacy, call can be transferred to refill team.   1. Which medications need to be refilled? (please list name of each medication and dose if known) nitroGLYCERIN (NITROSTAT) 0.4 MG SL tablet   2. Which pharmacy/location (including street and city if local pharmacy) is medication to be sent to? [place], [place]   3. Do they need a 30 day or 90 day supply? [place]

## 2023-01-03 MED ORDER — NITROGLYCERIN 0.4 MG SL SUBL: 0.4000 mg | SUBLINGUAL_TABLET | SUBLINGUAL | 6 refills | Status: AC | PRN

## 2023-01-04 ENCOUNTER — Other Ambulatory Visit: Payer: Self-pay | Admitting: Internal Medicine

## 2023-01-10 ENCOUNTER — Encounter: Payer: Self-pay | Admitting: Internal Medicine

## 2023-01-10 NOTE — Telephone Encounter (Signed)
Error

## 2023-01-11 ENCOUNTER — Telehealth: Payer: Self-pay | Admitting: Internal Medicine

## 2023-01-11 NOTE — Telephone Encounter (Signed)
*  STAT* If patient is at the pharmacy, call can be transferred to refill team.   1. Which medications need to be refilled? (please list name of each medication and dose if known)   clopidogrel (PLAVIX) 75 MG tablet    2. Which pharmacy/location (including street and city if local pharmacy) is medication to be sent to?  [place], [place]     3. Do they need a 30 day or 90 day supply?  90 day   Pharmacy states that they did not receive refill request. Pt out of medication

## 2023-01-15 ENCOUNTER — Other Ambulatory Visit: Payer: Self-pay

## 2023-01-17 ENCOUNTER — Telehealth: Payer: Self-pay | Admitting: Hematology and Oncology

## 2023-01-17 NOTE — Telephone Encounter (Signed)
Scheduled appt per 2/21 referral. Pt is aware of appt date and time. Pt is aware to arrive 15 mins prior to appt time and to bring and updated insurance card. Pt is aware of appt location.   ?

## 2023-02-09 ENCOUNTER — Inpatient Hospital Stay: Payer: Medicare Other | Attending: Hematology and Oncology | Admitting: Hematology and Oncology

## 2023-02-09 ENCOUNTER — Other Ambulatory Visit: Payer: Self-pay

## 2023-02-09 ENCOUNTER — Inpatient Hospital Stay: Payer: Medicare Other

## 2023-02-09 VITALS — BP 221/59 | HR 58 | Temp 97.9°F | Resp 14 | Wt 171.6 lb

## 2023-02-09 DIAGNOSIS — Z801 Family history of malignant neoplasm of trachea, bronchus and lung: Secondary | ICD-10-CM | POA: Diagnosis not present

## 2023-02-09 DIAGNOSIS — G8929 Other chronic pain: Secondary | ICD-10-CM | POA: Diagnosis not present

## 2023-02-09 DIAGNOSIS — R768 Other specified abnormal immunological findings in serum: Secondary | ICD-10-CM

## 2023-02-09 LAB — CBC WITH DIFFERENTIAL (CANCER CENTER ONLY)
Abs Immature Granulocytes: 0.01 10*3/uL (ref 0.00–0.07)
Basophils Absolute: 0 10*3/uL (ref 0.0–0.1)
Basophils Relative: 1 %
Eosinophils Absolute: 0.2 10*3/uL (ref 0.0–0.5)
Eosinophils Relative: 3 %
HCT: 37.6 % (ref 36.0–46.0)
Hemoglobin: 12.6 g/dL (ref 12.0–15.0)
Immature Granulocytes: 0 %
Lymphocytes Relative: 32 %
Lymphs Abs: 1.9 10*3/uL (ref 0.7–4.0)
MCH: 32.5 pg (ref 26.0–34.0)
MCHC: 33.5 g/dL (ref 30.0–36.0)
MCV: 96.9 fL (ref 80.0–100.0)
Monocytes Absolute: 0.7 10*3/uL (ref 0.1–1.0)
Monocytes Relative: 11 %
Neutro Abs: 3.2 10*3/uL (ref 1.7–7.7)
Neutrophils Relative %: 53 %
Platelet Count: 231 10*3/uL (ref 150–400)
RBC: 3.88 MIL/uL (ref 3.87–5.11)
RDW: 13.6 % (ref 11.5–15.5)
WBC Count: 6 10*3/uL (ref 4.0–10.5)
nRBC: 0 % (ref 0.0–0.2)

## 2023-02-09 LAB — CMP (CANCER CENTER ONLY)
ALT: 18 U/L (ref 0–44)
AST: 44 U/L — ABNORMAL HIGH (ref 15–41)
Albumin: 4.5 g/dL (ref 3.5–5.0)
Alkaline Phosphatase: 81 U/L (ref 38–126)
Anion gap: 6 (ref 5–15)
BUN: 26 mg/dL — ABNORMAL HIGH (ref 8–23)
CO2: 31 mmol/L (ref 22–32)
Calcium: 9.6 mg/dL (ref 8.9–10.3)
Chloride: 105 mmol/L (ref 98–111)
Creatinine: 0.87 mg/dL (ref 0.44–1.00)
GFR, Estimated: >60 mL/min (ref >60)
Glucose, Bld: 91 mg/dL (ref 70–99)
Potassium: 3.8 mmol/L (ref 3.5–5.1)
Sodium: 142 mmol/L (ref 135–145)
Total Bilirubin: 0.5 mg/dL (ref 0.3–1.2)
Total Protein: 7.4 g/dL (ref 6.5–8.1)

## 2023-02-09 LAB — LACTATE DEHYDROGENASE: LDH: 173 U/L (ref 98–192)

## 2023-02-09 NOTE — Progress Notes (Signed)
[place] [phone]   Fax:([phone] [place] NOTE  Patient Care Team: Nickola Major, MD as PCP - General (Family Medicine) Debara Pickett Nadean Corwin, MD as PCP - Cardiology (Cardiology)  Hematological/Oncological History # Elevated Serum Free Light Chains  12/09/2021: SPEP ordered showed no M protein, IFE unremarkable. SFLC showed kappa 19.75, lambda 10.49, ratio 1.88 01/12/2023: SFLC showed kappa 28.65, lambda 14.29, ratio 1.87 02/09/2023: establish care with Dr. [facility] Courier   CHIEF COMPLAINTS/PURPOSE OF CONSULTATION:  " Elevated Serum Free Light Chains  "  HISTORY OF PRESENTING ILLNESS:  Kristin Shaffer 89 y.o. female with medical history significant for GERD, HTN, HLD, hypothyroidism, and MI who presents for evaluation of an elevated SFLC ratio.   On review of the previous records Kristin Shaffer had labs collected on 12/09/2021 which showed no M protein, however Protein were elevated at 19.75 with a lambda of 10.49.  Ratio was 1.88.  Most recently on 01/12/2023 the patient have this lab repeated with a kappa of 28.65, lambda 14.29, with a ratio of 1.87.  Due to an elevated serum free light chain ratio the patient was referred to hematology for further evaluation and management.  On exam today Kristin Shaffer is accompanied by family.  She reports that her main concern is that she has chronic leg pain.  She notes that has been extensively worked up and no clear etiology has been found.  She notes that she think she is here today due to a "low blood count".  She notes that she has undergone ultrasounds and CT scan of her lower extremity without any clear problem having been identified.  On further discussion she reports that her mother had Alzheimer's disease and her father had a heart attack.  She notes that there is no history of cancer in her family.  She is a never smoker never drinker and previously did clerical work for seizures.  She notes that she does have  some occasional dark stools, some of which are tarry and sticky.  She notes that she is undergone stool card testing with her primary care provider and no evidence of blood in the stool has been identified.  She notes that she did have a heart attack in 2014.  She notes that otherwise she is not having any trouble with fevers, chills, sweats, nausea, vomiting or diarrhea.  A full 10 point ROS was otherwise negative.  MEDICAL HISTORY:  Past Medical History:  Diagnosis Date   Anxiety    Arthritis    "different places; not bad" (03/20/2013)   Coronary artery disease    Exertional shortness of breath    GERD (gastroesophageal reflux disease)    High cholesterol    Hypertension    Hypothyroidism    Myocardial infarction [facility])    "Dr's saw evidence I might have had a heart attack" (03/20/2013)   NSTEMI (non-ST elevated myocardial infarction) ([place]) 03/20/2013   cath - mid RCA   Pneumonia 2012   Skin cancer    "both legs; right arm" (03/20/2013)    SURGICAL HISTORY: Past Surgical History:  Procedure Laterality Date   APPENDECTOMY  2003   BREAST BIOPSY Bilateral 1990s   "total of 5; 2 on one side, 3 on the other; all benign" (03/20/2013)   CATARACT EXTRACTION W/ INTRAOCULAR LENS  IMPLANT, BILATERAL Bilateral ~ 2008   CORONARY ANGIOPLASTY  12/08/2013   CORONARY ANGIOPLASTY WITH STENT PLACEMENT  03/20/2013   NSTEMI - subtotal occlusion of mid RCA -  PCI of mid RCA of long tubular 40-60% lesion - 2 tandem 95-99% subtotal occlusions - 3 overlapping Xience Xpedition DES (Dr. Roni Bread)    [place]?   LACERATION REPAIR Right 03/24/2019   Procedure: COMPLEX FACIAL LACERATIONS;  Surgeon: Jerrell Belfast, MD;  Location: [facility];  Service: ENT;  Laterality: Right;   LEFT HEART CATHETERIZATION WITH CORONARY ANGIOGRAM N/A 03/20/2013   Procedure: LEFT HEART CATHETERIZATION WITH CORONARY ANGIOGRAM;  Surgeon: Sanda Klein, MD;  Location: [place] CATH LAB;  Service: Cardiovascular;   Laterality: N/A;   LEFT HEART CATHETERIZATION WITH CORONARY ANGIOGRAM N/A 12/08/2013   Procedure: LEFT HEART CATHETERIZATION WITH CORONARY ANGIOGRAM;  Surgeon: Blane Ohara, MD;  Location[facility] C[facility] LAB;  Service: Cardiovascular;  Laterality: N/A;   SKIN CANCER EXCISION     "1 off right arm; 2 off each leg" (03/20/2013)   TRANSTHORACIC ECHOCARDIOGRAM  03/2013   EF [id number], grade 1 diastolic dysfunction; mild MR; calcifed MV annulus; LA mildly dilated    SOCIAL HISTORY: Social History   Socioeconomic History   Marital status: Widowed    Spouse name: Not on file   Number of children: Not on file   Years of education: Not on file   Highest education level: Not on file  Occupational History   Occupation: retired   Tobacco Use   Smoking status: Never   Smokeless tobacco: Never  Substance and Sexual Activity   Alcohol use: No    Alcohol/week: 0.0 standard drinks of alcohol   Drug use: No   Sexual activity: Never  Other Topics Concern   Not on file  Social History Narrative   Lives alone.    Social Determinants of Health   Financial Resource Strain: Not on file  Food Insecurity: Not on file  Transportation Needs: Not on file  Physical Activity: Not on file  Stress: Not on file  Social Connections: Not on file  Intimate Partner Violence: Not on file    FAMILY HISTORY: Family History  Problem Relation Age of Onset   Dementia Mother    Lung cancer Father    CAD Father 28   Cancer Brother     ALLERGIES:  is allergic to hydrocodone-acetaminophen, omeprazole, zocor [simvastatin], and latex.  MEDICATIONS:  Current Outpatient Medications  Medication Sig Dispense Refill   aspirin EC 81 MG tablet Take 81 mg by mouth daily.     atorvastatin (LIPITOR) 20 MG tablet Take by mouth.     CALCIUM-VITAMIN D PO Take 1 tablet by mouth daily.     chlorthalidone (HYGROTON) 25 MG tablet TAKE 1 TABLET(25 MG) BY MOUTH DAILY FOR BLOOD PRESSURE     clopidogrel (PLAVIX) 75 MG tablet TAKE 1  TABLET EVERY DAY 90 tablet 3   ezetimibe (ZETIA) 10 MG tablet TAKE 1 TABLET EVERY DAY 90 tablet 10   levothyroxine (SYNTHROID) 125 MCG tablet Take by mouth.     losartan (COZAAR) 50 MG tablet Take 1 tablet (50 mg total) by mouth daily. 90 tablet 3   Magnesium Oxide 250 MG TABS Take 1 tablet by mouth daily.     metoprolol tartrate (LOPRESSOR) 25 MG tablet TAKE 1/2 TABLET TWICE DAILY 90 tablet 0   Multiple Vitamins-Minerals (MULTIVITAMIN ADULT PO) Take by mouth.     nitroGLYCERIN (NITROSTAT) 0.4 MG SL tablet Place 1 tablet (0.4 mg total) under the tongue every 5 (five) minutes as needed for chest pain. 25 tablet 6   cilostazol (PLETAL) 50 MG tablet TAKE 1  TABLET TWICE DAILY (Patient not taking: Reported on 02/09/2023) 180 tablet 3   No current facility-administered medications for this visit.    REVIEW OF SYSTEMS:   Constitutional: ( - ) fevers, ( - )  chills , ( - ) night sweats Eyes: ( - ) blurriness of vision, ( - ) double vision, ( - ) watery eyes Ears, nose, mouth, throat, and face: ( - ) mucositis, ( - ) sore throat Respiratory: ( - ) cough, ( - ) dyspnea, ( - ) wheezes Cardiovascular: ( - ) palpitation, ( - ) chest discomfort, ( - ) lower extremity swelling Gastrointestinal:  ( - ) nausea, ( - ) heartburn, ( - ) change in bowel habits Skin: ( - ) abnormal skin rashes Lymphatics: ( - ) new lymphadenopathy, ( - ) easy bruising Neurological: ( - ) numbness, ( - ) tingling, ( - ) new weaknesses Behavioral/Psych: ( - ) mood change, ( - ) new changes  All other systems were reviewed with the patient and are negative.  PHYSICAL EXAMINATION:  Vitals:   02/09/23 1356  BP: (!) 221/59  Pulse: (!) 58  Resp: 14  Temp: 97.9 F (36.6 C)  SpO2: 99%   Filed Weights   02/09/23 1356  Weight: 171 lb 9.6 oz (77.8 kg)    GENERAL: well appearing elderly Caucasian female in NAD  SKIN: skin color, texture, turgor are normal, no rashes or significant lesions EYES: conjunctiva are pink and  non-injected, sclera clear LUNGS: clear to auscultation and percussion with normal breathing effort HEART: regular rate & rhythm and no murmurs and no lower extremity edema Musculoskeletal: no cyanosis of digits and no clubbing  PSYCH: alert & oriented x 3, fluent speech NEURO: no focal motor/sensory deficits  LABORATORY DATA:  I have reviewed the data as listed    Latest Ref Rng & Units 02/09/2023    2:51 PM 03/24/2019   12:03 PM 09/20/2016    3:48 AM  CBC  WBC 4.0 - 10.5 K/uL 6.0  7.1  10.4   Hemoglobin 12.0 - 15.0 g/dL 12.6  13.8  12.7   Hematocrit 36.0 - 46.0 % 37.6  42.3  37.8   Platelets 150 - 400 K/uL 231  180  321        Latest Ref Rng & Units 02/09/2023    2:51 PM 10/03/2019   10:43 AM 03/24/2019   12:03 PM  CMP  Glucose 70 - 99 mg/dL 91  95  125   BUN 8 - 23 mg/dL 26  17  18    Creatinine 0.44 - 1.00 mg/dL 0.87  0.92  0.80   Sodium 135 - 145 mmol/L 142  142  138   Potassium 3.5 - 5.1 mmol/L 3.8  4.7  4.1   Chloride 98 - 111 mmol/L 105  100  106   CO2 22 - 32 mmol/L 31  26  26    Calcium 8.9 - 10.3 mg/dL 9.6  10.3  9.3   Total Protein 6.5 - 8.1 g/dL 7.4  6.8    Total Bilirubin 0.3 - 1.2 mg/dL 0.5  0.8    Alkaline Phos 38 - 126 U/L 81  136    AST 15 - 41 U/L 44  70    ALT 0 - 44 U/L 18  38       ASSESSMENT & PLAN Yevonne Pax 89 y.o. female with medical history significant for GERD, HTN, HLD, hypothyroidism, and MI who presents for evaluation of an  elevated SFLC ratio.   After review of the labs, review of the records, and discussion with the patient the patients findings are most consistent with an elevated serum free light chain ratio with no evidence of a monoclonal protein.  In the event our workup does not reveal a monoclonal protein there is no need for further evaluation.  We will evaluate this with an SPEP as well as a UPEP.  Additionally we will complete a metastatic bone survey.  In the event no abnormalities are noted would recommend turning her back over  to the care of her primary care provider.  There is no need for routine screening of an elevated serum free light chain ratio without a monoclonal component.  #Elevated Serum Free Light Chain Ratio  --today will order an SPEP, UPEP, SFLC and beta 2 microglobulin --additionally will collect new baseline CBC, CMP, and LDH --recommend a metastatic bone survey to assess for lytic lesions --will consider the need for a bone marrow biopsy pending the above results --RTC pending results of the above studies.    Orders Placed This Encounter  Procedures   DG Bone Survey Met    Standing Status:   Future    Standing Expiration Date:   02/11/2024    Order Specific Question:   Reason for Exam (SYMPTOM  OR DIAGNOSIS REQUIRED)    Answer:   MGUS, assess for lytic lesions    Order Specific Question:   Preferred imaging location?    Answer:   [facility]   CBC with Differential (Cancer Center Only)    Standing Status:   Future    Number of Occurrences:   1    Standing Expiration Date:   02/09/2024   CMP ([place] only)    Standing Status:   Future    Number of Occurrences:   1    Standing Expiration Date:   02/09/2024   Lactate dehydrogenase (LDH)    Standing Status:   Future    Number of Occurrences:   1    Standing Expiration Date:   02/09/2024   Multiple Myeloma Panel (SPEP&IFE w/QIG)    Standing Status:   Future    Number of Occurrences:   1    Standing Expiration Date:   02/09/2024   Kappa/lambda light chains    Standing Status:   Future    Number of Occurrences:   1    Standing Expiration Date:   02/09/2024   Beta 2 microglobulin    Standing Status:   Future    Number of Occurrences:   1    Standing Expiration Date:   02/09/2024   24-Hr Ur UPEP/UIFE/Light Chains/TP    Standing Status:   Future    Standing Expiration Date:   02/09/2024    All questions were answered. The patient knows to call the clinic with any problems, questions or concerns.  A total of more than 60  minutes were spent on this encounter with face-to-face time and non-face-to-face time, including preparing to see the patient, ordering tests and/or medications, counseling the patient and coordination of care as outlined above.   Ledell Peoples, MD Department of Hematology/Oncology [place] at [facility] Phone: [phone] Pager: [phone] Email: [email or web address]  02/11/2023 12:43 PM

## 2023-02-10 LAB — BETA 2 MICROGLOBULIN, SERUM: Beta-2 Microglobulin: 2.3 mg/L (ref 0.6–2.4)

## 2023-02-12 LAB — KAPPA/LAMBDA LIGHT CHAINS
Kappa free light chain: 20.6 mg/L — ABNORMAL HIGH (ref 3.3–19.4)
Kappa, lambda light chain ratio: 1.81 — ABNORMAL HIGH (ref 0.26–1.65)
Lambda free light chains: 11.4 mg/L (ref 5.7–26.3)

## 2023-02-14 LAB — MULTIPLE MYELOMA PANEL, SERUM
Albumin SerPl Elph-Mcnc: 3.7 g/dL (ref 2.9–4.4)
Albumin/Glob SerPl: 1.3 (ref 0.7–1.7)
Alpha 1: 0.2 g/dL (ref 0.0–0.4)
Alpha2 Glob SerPl Elph-Mcnc: 0.9 g/dL (ref 0.4–1.0)
B-Globulin SerPl Elph-Mcnc: 1 g/dL (ref 0.7–1.3)
Gamma Glob SerPl Elph-Mcnc: 0.8 g/dL (ref 0.4–1.8)
Globulin, Total: 2.9 g/dL (ref 2.2–3.9)
IgA: 81 mg/dL (ref 64–422)
IgG (Immunoglobin G), Serum: 891 mg/dL (ref 586–1602)
IgM (Immunoglobulin M), Srm: 31 mg/dL (ref 26–217)
Total Protein ELP: 6.6 g/dL (ref 6.0–8.5)

## 2023-02-15 ENCOUNTER — Other Ambulatory Visit: Payer: Self-pay | Admitting: *Deleted

## 2023-02-15 ENCOUNTER — Ambulatory Visit (HOSPITAL_COMMUNITY)
Admission: RE | Admit: 2023-02-15 | Discharge: 2023-02-15 | Disposition: A | Discharge status: Home / Self Care | Payer: Medicare Other | Source: Ambulatory Visit | Attending: Hematology and Oncology | Admitting: Hematology and Oncology

## 2023-02-15 DIAGNOSIS — R768 Other specified abnormal immunological findings in serum: Secondary | ICD-10-CM | POA: Diagnosis present

## 2023-02-20 ENCOUNTER — Telehealth: Payer: Self-pay | Admitting: *Deleted

## 2023-02-20 LAB — UPEP/UIFE/LIGHT CHAINS/TP, 24-HR UR
% BETA, Urine: 44.5 %
ALPHA 1 URINE: 4.9 %
Albumin, U: 16.1 %
Alpha 2, Urine: 11.4 %
Free Kappa Lt Chains,Ur: 10.71 mg/L (ref 1.17–86.46)
Free Kappa/Lambda Ratio: 1.41 — ABNORMAL LOW (ref 1.83–14.26)
Free Lambda Lt Chains,Ur: 7.57 mg/L (ref 0.27–15.21)
GAMMA GLOBULIN URINE: 23 %
Total Protein, Urine-Ur/day: 115 mg/24 hr (ref 30–150)
Total Protein, Urine: 10 mg/dL
Total Volume: 1150

## 2023-02-20 NOTE — Telephone Encounter (Addendum)
Notified of message below  ----- Message from Orson Slick, MD sent at 02/20/2023  8:34 AM EDT ----- Please let Kristin Shaffer know that her Urine studies and bloodwork did not show any concerning elevations in protein. There is no evidence of multiple myeloma. Additionally the bone X-ray did not show any concerning abnormalities. There is no need for routine f/u in our clinic.   ----- Message ----- From: [place]: 02/09/2023   3:07 PM EDT To: Orson Slick, MD

## 2023-03-08 ENCOUNTER — Other Ambulatory Visit: Payer: Self-pay | Admitting: Internal Medicine

## 2023-03-22 ENCOUNTER — Encounter: Payer: Self-pay | Admitting: Internal Medicine

## 2023-03-22 ENCOUNTER — Ambulatory Visit: Payer: Medicare Other | Attending: Internal Medicine | Admitting: Internal Medicine

## 2023-03-22 VITALS — BP 192/73 | HR 58 | Ht 65.5 in | Wt 172.4 lb

## 2023-03-22 DIAGNOSIS — I2510 Atherosclerotic heart disease of native coronary artery without angina pectoris: Secondary | ICD-10-CM | POA: Insufficient documentation

## 2023-03-22 DIAGNOSIS — E782 Mixed hyperlipidemia: Secondary | ICD-10-CM | POA: Diagnosis present

## 2023-03-22 DIAGNOSIS — I10 Essential (primary) hypertension: Secondary | ICD-10-CM | POA: Diagnosis present

## 2023-03-22 DIAGNOSIS — I739 Peripheral vascular disease, unspecified: Secondary | ICD-10-CM | POA: Diagnosis present

## 2023-03-22 MED ORDER — AMLODIPINE BESYLATE 5 MG PO TABS: 5.0000 mg | ORAL_TABLET | Freq: Every day | ORAL | 3 refills | Status: DC

## 2023-03-22 MED ORDER — AMLODIPINE BESYLATE 5 MG PO TABS: 5.0000 mg | ORAL_TABLET | Freq: Every day | ORAL | 2 refills | Status: DC

## 2023-03-22 MED ORDER — AMLODIPINE BESYLATE 5 MG PO TABS: 5.0000 mg | ORAL_TABLET | Freq: Every day | ORAL | 0 refills | Status: DC

## 2023-03-22 NOTE — Patient Instructions (Signed)
Medication Instructions:  START amlodipine 5 mg daily  *If you need a refill on your cardiac medications before your next appointment, please call your pharmacy*  Follow-Up: At [facility], you and your health needs are our priority.  As part of our continuing mission to provide you with exceptional heart care, we have created designated Provider Care Teams.  These Care Teams include your primary Cardiologist (physician) and Advanced Practice Providers (APPs -  Physician Assistants and Nurse Practitioners) who all work together to provide you with the care you need, when you need it.  We recommend signing up for the patient portal called "MyChart".  Sign up information is provided on this After Visit Summary.  MyChart is used to connect with patients for Virtual Visits (Telemedicine).  Patients are able to view lab/test results, encounter notes, upcoming appointments, etc.  Non-urgent messages can be sent to your provider as well.   To learn more about what you can do with MyChart, go to [email or web address].    Your next appointment:   2 month(s)  Provider:   Azalee Course PA

## 2023-03-22 NOTE — Addendum Note (Signed)
Addended by: Johney Frame A on: 03/22/2023 09:42 AM   Modules accepted: Orders

## 2023-03-22 NOTE — Progress Notes (Signed)
OFFICE NOTE  Chief Complaint:  Routine follow-up  Primary Care Physician: Gwenlyn Found, MD  HPI:  Kristin Shaffer is an 89 year old Caucasian female with a history of hyperlipidemia and hypothyroidism. She was seen by myself on March 20, 2013, and at that time was found to be in hypertensive urgency with a blood pressure of 230/94. She was admitted to [facility] for blood pressure control and cardiac catheterization for unstable angina. She did in fact have a non-ST-elevation myocardial infarction with a mildly elevated troponin of 0.67. She underwent coronary angiography, which revealed a subtotal occlusion of the mid right coronary artery. She then underwent a successful complex PCI of the entire mid RCA encompassing a long tubular 40% to 60% lesion that preceded 2 tandem 95% and 99% subtotal occlusions. This was completed using 3 overlapping Xience Xpedition drug-eluting stents. She was placed on aspirin and Brilinta, and was discharged. She subsequently returned to [facility] on April 26 to the emergency room with chest pain. Patient states that it was a "sharp, stabbing, and burning" pain. It was 10 out of 10 in intensity. She took 4 baby aspirin at home, but did not have any nitroglycerin. EMS gave her one, which provided her some relief, and midway to the ER, they gave her another one, which essentially resolved the pain. Patient reports feeling weak, but that is improving compared to when she was discharged. She has had no chest pain since the episode on the 26th. She was provided with a prescription at the ER for sublingual nitroglycerin. She did undergo an echocardiogram which showed preserved LV systolic function and an EF of 60-65%. There was stage I diastolic dysfunction and aortic sclerosis with mild central aortic regurgitation. Otherwise no significant abnormalities.  She also had carotid and renal Dopplers. The carotid Dopplers indicated a small amount of bilateral  plaque. The renal Dopplers did not indicate any renal artery stenosis.  Recently she had lipid profile performed which showed a particle number of 1783, with LDL C. of 143.  Based on this I recommended increasing her Lipitor from 20-40 mg.  She did make this change, but as noted pain and weakness in her joints, especially when she wakes up in the morning which takes about 30 minutes to improve. This sounds a lot like arthritis, but she really feels it is related to her cholesterol medicine.  Overall, at her last office visit I felt she was doing fairly well. Unfortunately about 5 days after that visit she started to have acute onset chest discomfort with radiation of pain into her left arm. She presented to the emergency department and was found to have unstable angina. Cardiac catheterization was performed which demonstrated 99% in-stent stenosis at the previously placed mid RCA stent.  I am interested as to why she presented with acute unstable angina, and not progressive anginal symptoms with exertion. She certainly had absolutely no chest pain or arm pain just a few days prior to the presentation, which is more consistent with an acute coronary syndrome.  [name] returns today for follow-up. She reports that she has had to take nitroglycerin twice for chest discomfort. Wants while riding in a car and another time at rest. Both episodes sound more like reflux however she did have improvement after about 15 minutes. She recently is been having problems with bruising on aspirin and Effient and is concerned about being on strong blood thinners. Her last stent placement was in 11/2013. Dual antiplatelet therapy was  recommended for at least a year.  I saw [name] back in the office today. She is complaining of some weakness and fatigue. She also soreness in her muscles, particularly when she gets up in the morning. Some of it seems to be joint soreness which is concerning for osteoarthritis. She clearly  has signs of osteoarthritis in her DIP joints and there is some crepitus in her knees. It is however possible that some of her symptoms could be related to Lipitor. She is on combination Lipitor and fenofibrate, which is slightly higher risk of developing myalgias. Unfortunately, her cholesterol has been really well controlled.   [name] returns today for follow-up. She recently was planting some irises in her lawn and is complaining of some back pain and spasm. In the past she had some relief from Flexeril, however does not currently have any. She denies any cardiac chest pain. She does get some occasional reflux symptoms associated with certain foods.  [name] see him back in the office again today. She has some intermittent chest discomfort. The symptoms seem atypical and are not like the chest and left arm pain she had in the past. She recently saw her primary care provider who noted some possible fluid in the right lung base. I do hear some faint crackles today however it sounds to me more like atelectasis or even fibrosis. She reports some shortness of breath. She also thinks she may be having reflux symptoms but is hesitant to take omeprazole because she says that she feels that could cause her chest pain. I reassured her that it would not.  04/14/2016  [name] was seen back today in follow-up for review of her echocardiogram. This is essentially unchanged compared to her prior study in 2014. Her pulmonary pressure is elevated at 45 mmHg. LVEF is 60-65%. There is mild diastolic dysfunction. Although her primary pressure is elevated, this is not likely a significant cause of her shortness of breath given the mild elevation. I am concerned however that she may have underlying pulmonary fibrosis. There is evidence of this by chest x-ray in 2015 and she has some dry crackles on exam. I do not believe there is interstitial edema. She is also complaining now of right flank pain. She has some CVA  tenderness but has had no fever, chills or shaking chills. She recently had 2 UTIs apparently which eventually improved with Cipro. She denies any chest pain.  05/22/2016  Mrs. Troost returns today for follow-up. She underwent CT scan of the chest due to some interstitial changes on her chest x-ray and right flank/lower right posterior chest pain. She reports that this chest pain has improved and resolved although she still notes some shortness of breath with exertion. The CT scan shows a pattern of rather widespread groundglass attenuation most evident in the mid to lower lung fields. There are patchy areas of mild subpleural reticulation noted as well. This is concerning for either non-specific interstitial pneumonia or possibly IPF. Based on these findings, I would recommend a pulmonary evaluation. There is no evidence of a pneumonia. Her BNP is low at 40 and I did not suspect this is congestive heart failure or pulmonary edema. Finally she underwent abdominal ultrasound which demonstrated moderate aortoiliac atherosclerosis which will need follow-up annually.  01/15/2018  Mrs. Berti returns today for follow-up. She's recently had a number of medical problems include a hospitalization for intracerebral hemorrhage. This was a result of trauma falling down stairs. Fortunately this resolved completely. She  did see Dr. Kathrin Penner for possible nonspecific interstitial pneumonia but he feels that her radiographic findings an abnormal PFTs may be attributable to chronic aspiration with GERD. She does report shortness of breath with moderate exertion. Blood pressure today was initially elevated 188/71 however recheck was 120/68. She was switched from lisinopril to losartan for persistent cough however is noted no change. She is on over-the-counter H2 blocker for her reflux.  09/28/2017  Markeeta was seen today in follow-up.  Overall she seems to be doing well.  She saw Dr. Kendrick Fries for pulmonary fibrosis.  He is  thought that this may be related to prior pesticide exposure or silent aspiration.  He does not feel like he has changed significantly and may not be IPF.  She denies any recurrent chest pain.  She is recovered from her intracerebral hemorrhage.  She is now back on aspirin and Plavix.  She has not had recent lipid testing.  She is n.p.o. today.  Blood pressure is top normal 140/78.  08/29/2018  Hong is seen today in follow-up.  Overall she seems to be doing well.  She had seen Dr. Kendrick Fries for pulmonary fibrosis and he felt that she did not need any further therapies but wanted to follow-up with her.  She does not have an appointment I encouraged her to reach out.  She denies any chest pain.  Blood pressure is well controlled today 122/64.  EKG shows sinus rhythm.  10/03/2019  Stephana returns today for follow-up.  In general she feels well.  She denies any worsening shortness of breath or chest pain.  Blood pressure was initially elevated today however came down to 140/64.  She had lipid testing last year which showed total cholesterol 171, triglycerides 144, HDL 65 and LDL 77.  Her target LDL is less than 70.  She is on dual antiplatelet therapy with aspirin and Plavix.  She also takes levothyroxine and has not had a recheck recently.  She is inquiring about getting lab work today.  EKG shows sinus rhythm at 60.  12/27/2020  Lisandra is seen today in follow-up.  Overall she is doing fairly well.  She is describing arthritis in her right leg however is not improved after the injection.  She now has to walk with a cane.  She had one episode of chest discomfort but it was brief.  She said she took 2 nitroglycerin in bed without any improvement and then went to sleep.  Overall does not sound cardiac.  She follows with Dr. Isaiah Serge and pulmonary for fibrosis.  Blood pressure is good today.  She is overdue for repeat lipid and thyroid studies from her PCP.  02/17/2022  Shyah returns today for follow-up.  She  has been having some issues walking.  She says is difficult for her to get around.  She does have some lower extremity arthritis but notes some pain in her legs when walking certain distances that improves at rest.  She does have a history of moderate aortoiliac disease back in 2018.  Recent Dopplers have not been very good quality.  She is however overdue for reimaging of that.  She has had stable pulmonary fibrosis and follows with Dr. Isaiah Serge.  Her lipids in January were not well controlled, this was due to a statin holiday which was supposed to be for 2 weeks but ended up being over a year.  Her cholesterol had then gone up to a total 309, triglycerides 203, HDL 65 and LDL 205.  At that  point she was noted to not have had any improvement in her leg pain and therefore was restarted on her medications by her PCP.  03/22/2023  Sheba is seen today in follow-up.  She saw Azalee Course, PA-C in the fall.  Repeat lower extremity arterial Doppler showed severe left SFA stenosis.  There was a discussion between him and Dr. Allyson Sabal about possible intervention.  It was felt that she was not a good candidate for that and most of her symptoms were right leg pain not left leg.  They started Pletal but she noted no difference in the medicine and then she stopped it.  She still is having difficulty getting around.  She reportedly has diffuse arthritis.  She is scheduled to see Dr. Dierdre Forth soon.  Her blood pressure was quite elevated today 192/73.  She says at home and generally it is around 170 systolic which is actually poorly controlled.  She is on a combination of chlorthalidone and losartan.  PMHx:  Past Medical History:  Diagnosis Date   Anxiety    Arthritis    "different places; not bad" (03/20/2013)   Coronary artery disease    Exertional shortness of breath    GERD (gastroesophageal reflux disease)    High cholesterol    Hypertension    Hypothyroidism    Myocardial infarction    "Dr's saw evidence I might  have had a heart attack" (03/20/2013)   NSTEMI (non-ST elevated myocardial infarction) 03/20/2013   cath - mid RCA   Pneumonia 2012   Skin cancer    "both legs; right arm" (03/20/2013)    Past Surgical History:  Procedure Laterality Date   APPENDECTOMY  2003   BREAST BIOPSY Bilateral 1990s   "total of 5; 2 on one side, 3 on the other; all benign" (03/20/2013)   CATARACT EXTRACTION W/ INTRAOCULAR LENS  IMPLANT, BILATERAL Bilateral ~ 2008   CORONARY ANGIOPLASTY  12/08/2013   CORONARY ANGIOPLASTY WITH STENT PLACEMENT  03/20/2013   NSTEMI - subtotal occlusion of mid RCA - PCI of mid RCA of long tubular 40-60% lesion - 2 tandem 95-99% subtotal occlusions - 3 overlapping Xience Xpedition DES (Dr. Ranae Palms)    DILATION AND CURETTAGE OF UTERUS  1956?   LACERATION REPAIR Right 03/24/2019   Procedure: COMPLEX FACIAL LACERATIONS;  Surgeon: Osborn Coho, MD;  Location: [facility] OR;  Service: ENT;  Laterality: Right;   LEFT HEART CATHETERIZATION WITH CORONARY ANGIOGRAM N/A 03/20/2013   Procedure: LEFT HEART CATHETERIZATION WITH CORONARY ANGIOGRAM;  Surgeon: Thurmon Fair, MD;  Location: [facility] CATH LAB;  Service: Cardiovascular;  Laterality: N/A;   LEFT HEART CATHETERIZATION WITH CORONARY ANGIOGRAM N/A 12/08/2013   Procedure: LEFT HEART CATHETERIZATION WITH CORONARY ANGIOGRAM;  Surgeon: Micheline Chapman, MD;  Location: [facility] CATH LAB;  Service: Cardiovascular;  Laterality: N/A;   SKIN CANCER EXCISION     "1 off right arm; 2 off each leg" (03/20/2013)   TRANSTHORACIC ECHOCARDIOGRAM  03/2013   EF 60-65%, grade 1 diastolic dysfunction; mild MR; calcifed MV annulus; LA mildly dilated    FAMHx:  Family History  Problem Relation Age of Onset   Dementia Mother    Lung cancer Father    CAD Father 71   Cancer Brother     SOCHx:   reports that she has never smoked. She has never used smokeless tobacco. She reports that she does not drink alcohol and does not use drugs.  ALLERGIES:  Allergies  Allergen Reactions    Hydrocodone-Acetaminophen Nausea And Vomiting  Omeprazole Other (See Comments)    Made acid reflux worse   Zocor [Simvastatin] Other (See Comments)    Trouble Walking, Leg weakness and cramping   Latex Hives and Rash    ROS: Pertinent items noted in HPI and remainder of comprehensive ROS otherwise negative.  HOME MEDS: Current Outpatient Medications  Medication Sig Dispense Refill   aspirin EC 81 MG tablet Take 81 mg by mouth daily.     atorvastatin (LIPITOR) 20 MG tablet Take by mouth.     CALCIUM-VITAMIN D PO Take 1 tablet by mouth daily.     chlorthalidone (HYGROTON) 25 MG tablet TAKE 1 TABLET(25 MG) BY MOUTH DAILY FOR BLOOD PRESSURE     cilostazol (PLETAL) 50 MG tablet TAKE 1 TABLET TWICE DAILY 180 tablet 3   clopidogrel (PLAVIX) 75 MG tablet TAKE 1 TABLET EVERY DAY 90 tablet 3   ezetimibe (ZETIA) 10 MG tablet TAKE 1 TABLET EVERY DAY 90 tablet 10   levothyroxine (SYNTHROID) 125 MCG tablet Take by mouth.     losartan (COZAAR) 50 MG tablet Take 1 tablet (50 mg total) by mouth daily. 90 tablet 3   Magnesium Oxide 250 MG TABS Take 1 tablet by mouth daily.     metoprolol tartrate (LOPRESSOR) 25 MG tablet TAKE 1/2 TABLET TWICE DAILY 90 tablet 0   Multiple Vitamins-Minerals (MULTIVITAMIN ADULT PO) Take by mouth.     nitroGLYCERIN (NITROSTAT) 0.4 MG SL tablet Place 1 tablet (0.4 mg total) under the tongue every 5 (five) minutes as needed for chest pain. 25 tablet 6   No current facility-administered medications for this visit.    LABS/IMAGING: No results found for this or any previous visit (from the past 48 hour(s)). No results found.  VITALS: BP (!) 192/73   Pulse (!) 58   Ht 5' 5.5" (1.664 m)   Wt 172 lb 6.4 oz (78.2 kg)   SpO2 98%   BMI 28.25 kg/m   EXAM: General appearance: alert and no distress Neck: no carotid bruit and no JVD Lungs: clear to auscultation bilaterally Heart: regular rate and rhythm, S1, S2 normal, no murmur, click, rub or gallop Abdomen: soft,  non-tender; bowel sounds normal; no masses,  no organomegaly Extremities: extremities normal, atraumatic, no cyanosis or edema Pulses: 2+ and symmetric Skin: Skin color, texture, turgor normal. No rashes or lesions Neurologic: Grossly normal  EKG: Sinus bradycardia at 58-personally reviewed  ASSESSMENT: Coronary artery disease status post recent and STEMI, PCI to the RCA with 4 overlapping Xience drug-eluting stents - now with recent UA and ISR (2015) Hypertension Hypothyroidism GERD Dyslipidemia - now on lipitor, zetia and fenofibrate Low back spasm Progressive DOE - possible pulmonary fibrosis on CT Pulmonary hypertension  Right flank pain Moderate aorto-iliac atherosclerosis Recent traumatic ICH  PLAN: 1.   Mrs. Koppel seems to have poorly controlled hypertension.  She is on 2 agents and will likely need a third agent for better control.  Recommend starting amlodipine 5 mg daily.  She primarily has right leg pain and difficulty ambulating but was noted to have left SFA stenosis.  She was not thought to be a good candidate for intervention and was trialed on Pletal with no improvement.  She has now stopped that medicine.  She has an upcoming appointment with rheumatology.  She denies any chest pain.  Plan follow-up in about 2 to 3 months with an APP for blood pressure monitoring.  Chrystie Nose, MD, [facility], FACP  [place]  [facility]  Medical Director  of the Advanced Lipid Disorders &  Cardiovascular Risk Reduction Clinic Diplomate of the American Board of Clinical Lipidology Attending Cardiologist  Direct Dial: [phone]  Fax: [phone]  Website:  [email or web address] Nicely Drianna Chandran 03/22/2023, 9:24 AM

## 2023-05-28 ENCOUNTER — Other Ambulatory Visit: Payer: Self-pay | Admitting: Internal Medicine

## 2023-06-07 ENCOUNTER — Encounter: Payer: Self-pay | Admitting: Physician Assistant

## 2023-06-07 ENCOUNTER — Ambulatory Visit: Payer: Medicare Other | Attending: Physician Assistant | Admitting: Physician Assistant

## 2023-06-07 VITALS — BP 124/84 | HR 56 | Ht 65.0 in | Wt 172.2 lb

## 2023-06-07 DIAGNOSIS — I739 Peripheral vascular disease, unspecified: Secondary | ICD-10-CM | POA: Diagnosis not present

## 2023-06-07 DIAGNOSIS — E785 Hyperlipidemia, unspecified: Secondary | ICD-10-CM | POA: Diagnosis not present

## 2023-06-07 DIAGNOSIS — I2510 Atherosclerotic heart disease of native coronary artery without angina pectoris: Secondary | ICD-10-CM

## 2023-06-07 DIAGNOSIS — I10 Essential (primary) hypertension: Secondary | ICD-10-CM | POA: Insufficient documentation

## 2023-06-07 DIAGNOSIS — R300 Dysuria: Secondary | ICD-10-CM | POA: Diagnosis not present

## 2023-06-07 MED ORDER — LOSARTAN POTASSIUM 50 MG PO TABS: 50.0000 mg | ORAL_TABLET | Freq: Every day | ORAL | Status: DC

## 2023-06-07 MED ORDER — AMLODIPINE BESYLATE 5 MG PO TABS: 2.5000 mg | ORAL_TABLET | Freq: Every day | ORAL | Status: DC

## 2023-06-07 NOTE — Progress Notes (Signed)
Cardiology Office Note:  .   Date:  06/07/2023  ID:  Kristin Shaffer, DOB [birth year], MRN [id number] PCP: Kristin Found, MD  Kristin Shaffer Providers Cardiologist:  Kristin Nose, MD     History of Present Illness: .   Kristin Shaffer is a 89 y.o. female with past medical history of CAD, hyperlipidemia and hypothyroidism.  She had subtotal occlusion of mid RCA in April 2014 and underwent stenting using 3 overlapping drug-eluting stents.  Renal artery Doppler obtained in May 2014 showed no significant renal artery stenosis. Cardiac catheterization performed in January 2015 revealed 99% in-stent restenosis in the proximal RCA stent treated with balloon angioplasty. High-resolution CT of chest obtained on 02/07/2022 showed mild pulmonary fibrosis.  CT angio of abdomen and bifemoral showed a focal high-grade stenosis in the left distal superficial femoral artery, 40% narrowing in the infrarenal abdominal aorta, cholelithiasis, age-indeterminate L1 vertebral body fracture with approximately 20% height loss, colonic diverticular disease without active inflammation, multilevel degenerative disc disease and extensive groundglass attenuation airspace opacities throughout the lower lungs.  PFT obtained on 05/18/2022 was normal.  She is minimally ambulatory, lives alone and drives short distance.  She was seen by Dr. Rennis Shaffer who was concerned of claudication symptoms and referred to Dr. Allyson Shaffer for further evaluation.  She was seen by Dr. Allyson Shaffer on 05/16/2022 due to leg pain and hip pain.  Subsequent ABI was abnormal revealing mostly triphasic and biphasic waveform with a minimal plaque in the right lower extremity, monophasic waveform in the left common femoral artery, monophasic waveform in the distal left SFA with 75 to 99% lesion, monophasic waveforms in proximal to distal popliteal artery, proximal left anterior tibial artery, and entire left peroneal artery.   I last saw the patient in August 2023 to  discuss the vascular ultrasound results.  Patient was quite sedentary at the time walking less than 50 yard.  Her symptom was somewhat atypical for claudication.  I suspect a lot of her pain was more arthritic rather than vascular.  Given her advanced age and the lack of significant functional ability at baseline and atypical nature of the lower extremity pain, I ended up discussing the case with Dr. Gery Shaffer who did not feel that lower extremity angiography intervention would significantly improve her current state.  She was started on Pletal, however did not notice significant difference after starting on the medication therefore she stopped it.  She was last seen by Dr. Rennis Shaffer in April 2024 at which time her blood pressure was very high.  Amlodipine was added to her medical regimen.  Since last visit, blood pressure has been borderline low at home running around high 90s to 120s according to the patient.  She is on 4 blood pressure medication, amlodipine, losartan, chlorthalidone and metoprolol tartrate.  She takes all blood pressure medications in the morning.  She continued to have leg/knee pain, right worse than left, even though left leg has more peripheral arterial disease.  She is getting injections in her knee.  I recommended reduce the dose of amlodipine down to 2.5 mg daily and move the losartan from morning dose to nighttime to make the blood pressure more given.  Will aim for systolic blood pressure of 120-130 systolic in this 89 year old patient who is very frail.  She can follow-up in 6 months.  She does complain of dysuria, will obtain urinalysis.  ROS:   She denies chest pain, palpitations, dyspnea, pnd, orthopnea, n, v, dizziness, syncope, edema, weight gain,  or early satiety. All other systems reviewed and are otherwise negative except as noted above.   Patient does complain of dysuria.  Studies Reviewed: .        Cardiac Studies & Procedures       ECHOCARDIOGRAM  ECHOCARDIOGRAM  COMPLETE 09/11/2016  Narrative *[place]* *[facility]* [address] [place], [place] [address] [phone]  ------------------------------------------------------------------- Transthoracic Echocardiography  Patient:    Kristin, Shaffer MR #:       [id number] Study Date: 09/11/2016 Gender:     F Age:        46 Height:     167.6 cm Weight:     81.2 kg BSA:        1.97 m^2 Pt. Status: Room:       [facility]  Nicole Cella, M.D. REFERRING    Marca Ancona, M.D. Betha Loa [id number] ATTENDING    Rancour, Jeannett Senior [id number] PERFORMING   [facility], Inpatient SONOGRAPHER  Lysbeth Galas, RDCS  cc:  ------------------------------------------------------------------- LV EF: 65% -   70%  ------------------------------------------------------------------- Indications:      Syncope 780.2.  ------------------------------------------------------------------- History:   PMH:   Dyspnea.  Coronary artery disease.  PMH: Myocardial infarction.  Risk factors:  Hypertension.  ------------------------------------------------------------------- Study Conclusions  - Left ventricle: Hyperdynamic LV witth small mid cavitary gradient The cavity size was normal. Wall thickness was normal. Systolic function was vigorous. The estimated ejection fraction was in the range of 65% to 70%. Wall motion was normal; there were no regional wall motion abnormalities. Doppler parameters are consistent with both elevated ventricular end-diastolic filling pressure and elevated left atrial filling pressure. - Aortic valve: There was trivial regurgitation. - Mitral valve: Calcified annulus. - Atrial septum: No defect or patent foramen ovale was identified. - Pulmonary arteries: PA peak pressure: 37 mm Hg (S).  ------------------------------------------------------------------- Study data:  Comparison was made to the study of 03/30/2016.  Study status:  Routine.  Procedure:   The patient reported no pain pre or post test. Transthoracic echocardiography. Image quality was suboptimal. The study was technically difficult, as a result of poor patient compliance and restricted patient mobility.  Study completion:  There were no complications.          Transthoracic echocardiography.  M-mode, complete 2D, spectral Doppler, and color Doppler.  Birthdate:  Patient birthdate: 1931/07/12.  Age:  Patient is 89 yr old.  Sex:  Gender: female.    BMI: 28.9 kg/m^2.  Blood pressure:     121/57  Patient status:  Inpatient.  Study date: Study date: 09/11/2016. Study time: 11:01 AM.  Location:  ICU/CCU  -------------------------------------------------------------------  ------------------------------------------------------------------- Left ventricle:  Hyperdynamic LV witth small mid cavitary gradient The cavity size was normal. Wall thickness was normal. Systolic function was vigorous. The estimated ejection fraction was in the range of 65% to 70%. Wall motion was normal; there were no regional wall motion abnormalities. Doppler parameters are consistent with both elevated ventricular end-diastolic filling pressure and elevated left atrial filling pressure.  ------------------------------------------------------------------- Aortic valve:   Trileaflet; normal thickness, mildly calcified leaflets. Mobility was not restricted.  Doppler:  Transvalvular velocity was within the normal range. There was no stenosis. There was trivial regurgitation.  ------------------------------------------------------------------- Aorta:  The aorta was normal, not dilated, and non-diseased. Aortic root: The aortic root was normal in size.  ------------------------------------------------------------------- Mitral valve:   Calcified annulus. Mobility was not restricted. Doppler:  Transvalvular velocity was within the normal range. There was  no evidence for stenosis. There was trivial  regurgitation. Peak gradient (D): 5 mm Hg.  ------------------------------------------------------------------- Left atrium:  The atrium was normal in size.  ------------------------------------------------------------------- Atrial septum:  No defect or patent foramen ovale was identified.  ------------------------------------------------------------------- Right ventricle:  The cavity size was normal. Wall thickness was normal. Systolic function was normal.  ------------------------------------------------------------------- Pulmonic valve:    Doppler:  Transvalvular velocity was within the normal range. There was no evidence for stenosis. There was trivial regurgitation.  ------------------------------------------------------------------- Tricuspid valve:   Structurally normal valve.    Doppler: Transvalvular velocity was within the normal range. There was mild regurgitation.  ------------------------------------------------------------------- Pulmonary artery:   The main pulmonary artery was normal-sized. Systolic pressure was within the normal range.  ------------------------------------------------------------------- Right atrium:  The atrium was normal in size.  ------------------------------------------------------------------- Pericardium:  The pericardium was normal in appearance. There was no pericardial effusion.  ------------------------------------------------------------------- Systemic veins: Inferior vena cava: The vessel was normal in size. The respirophasic diameter changes were in the normal range (>= 50%), consistent with normal central venous pressure.  ------------------------------------------------------------------- Post procedure conclusions Ascending Aorta:  - The aorta was normal, not dilated, and non-diseased.  ------------------------------------------------------------------- Measurements  Left ventricle                           Value         Reference LV ID, ED, PLAX chordal          (L)     29.1  mm     43 - 52 LV ID, ES, PLAX chordal          (L)     19.3  mm     23 - 38 LV fx shortening, PLAX chordal           34    %      >=29 LV PW thickness, ED                      9.81  mm     --------- IVS/LV PW ratio, ED                      0.95         <=1.3 Stroke volume, 2D                        95    ml     --------- Stroke volume/bsa, 2D                    48    ml/m^2 --------- LV e&', lateral                           6.64  cm/s   --------- LV E/e&', lateral                         16.72        --------- LV e&', medial                            5.22  cm/s   --------- LV E/e&', medial                          21.26        ---------  LV e&', average                           5.93  cm/s   --------- LV E/e&', average                         18.72        ---------  Ventricular septum                       Value        Reference IVS thickness, ED                        9.29  mm     ---------  LVOT                                     Value        Reference LVOT ID, S                               20    mm     --------- LVOT area                                3.14  cm^2   --------- LVOT peak velocity, S                    142   cm/s   --------- LVOT mean velocity, S                    96.3  cm/s   --------- LVOT VTI, S                              30.3  cm     --------- LVOT peak gradient, S                    8     mm Hg  ---------  Aortic valve                             Value        Reference Aortic regurg pressure half-time         300   ms     ---------  Aorta                                    Value        Reference Aortic root ID, ED                       33    mm     ---------  Left atrium                              Value        Reference LA ID, A-P, ES  22    mm     --------- LA ID/bsa, A-P                           1.12  cm/m^2 <=2.2 LA volume, ES, 1-p A4C                   70.7   ml     --------- LA volume/bsa, ES, 1-p A4C               36    ml/m^2 --------- LA volume, ES, 1-p A2C                   39.7  ml     --------- LA volume/bsa, ES, 1-p A2C               20.2  ml/m^2 ---------  Mitral valve                             Value        Reference Mitral E-wave peak velocity              111   cm/s   --------- Mitral A-wave peak velocity              163   cm/s   --------- Mitral deceleration time         (H)     259   ms     150 - 230 Mitral peak gradient, D                  5     mm Hg  --------- Mitral E/A ratio, peak                   0.7          ---------  Pulmonary arteries                       Value        Reference PA pressure, S, DP               (H)     37    mm Hg  <=30  Tricuspid valve                          Value        Reference Tricuspid regurg peak velocity           293   cm/s   --------- Tricuspid peak RV-RA gradient            34    mm Hg  ---------  Systemic veins                           Value        Reference Estimated CVP                            3     mm Hg  ---------  Right ventricle                          Value        Reference TAPSE  23.6  mm     --------- RV pressure, S, DP               (H)     37    mm Hg  <=30 RV s&', lateral, S                        22.9  cm/s   ---------  Legend: (L)  and  (H)  mark values outside specified reference range.  ------------------------------------------------------------------- Prepared and Electronically Authenticated by  Charlton Haws, M.D. 2017-10-16T12:00:08             Risk Assessment/Calculations:             Physical Exam:   VS:  BP 124/84   Pulse (!) 56   Ht 5\' 5"  (1.651 m)   Wt 172 lb 3.2 oz (78.1 kg)   SpO2 96%   BMI 28.66 kg/m    Wt Readings from Last 3 Encounters:  06/07/23 172 lb 3.2 oz (78.1 kg)  03/22/23 172 lb 6.4 oz (78.2 kg)  02/09/23 171 lb 9.6 oz (77.8 kg)    GEN: Well nourished, well developed in no acute  distress NECK: No JVD; No carotid bruits CARDIAC: RRR, no murmurs, rubs, gallops RESPIRATORY:  Clear to auscultation without rales, wheezing or rhonchi  ABDOMEN: Soft, non-tender, non-distended EXTREMITIES:  No edema; No deformity   ASSESSMENT AND PLAN: .    Dysuria: Patient complains of dysuria.  Will obtain urinalysis  CAD: Denies any chest pain.  On aspirin and Lipitor  Hyperlipidemia: On Lipitor 20 mg daily  PAD: Previous ABI and LEA showed distal left SFA 75 to 99% lesion, however she has more right sided pain rather than left-sided pain.  Most of her pain is in her knees.  Therefore, we suspected her symptom is more associated with arthritic pain rather than claudication.  Hypertension: She has been noticing some low blood pressure after additional 5 mg amlodipine.  Will reduce amlodipine to 2.5 mg daily and move losartan from the morning time to nighttime.       Dispo: Follow-up with Dr. Allyson Shaffer in 54-month.  Signed, Azalee Course, PA

## 2023-06-07 NOTE — Patient Instructions (Signed)
Medication Instructions:  Your physician has recommended you make the following change in your medication:   REDUCE the Amlodipine to 5 mg taking 1/2 tablet daily  CHANGE the Losartan to taking at night   *If you need a refill on your cardiac medications before your next appointment, please call your pharmacy*   Lab Work: TODAY:  URINALYSIS  If you have labs (blood work) drawn today and your tests are completely normal, you will receive your results only by: MyChart Message (if you have MyChart) OR A paper copy in the mail If you have any lab test that is abnormal or we need to change your treatment, we will call you to review the results.   Testing/Procedures: None ordered   Follow-Up: At [facility], you and your health needs are our priority.  As part of our continuing mission to provide you with exceptional heart care, we have created designated Provider Care Teams.  These Care Teams include your primary Cardiologist (physician) and Advanced Practice Providers (APPs -  Physician Assistants and Nurse Practitioners) who all work together to provide you with the care you need, when you need it.  We recommend signing up for the patient portal called "MyChart".  Sign up information is provided on this After Visit Summary.  MyChart is used to connect with patients for Virtual Visits (Telemedicine).  Patients are able to view lab/test results, encounter notes, upcoming appointments, etc.  Non-urgent messages can be sent to your provider as well.   To learn more about what you can do with MyChart, go to [email or web address].    Your next appointment:   6 month(s)  Provider:   Chrystie Nose, MD     Other Instructions

## 2023-06-08 ENCOUNTER — Other Ambulatory Visit: Payer: Self-pay | Admitting: Physician Assistant

## 2023-06-08 DIAGNOSIS — N390 Urinary tract infection, site not specified: Secondary | ICD-10-CM

## 2023-06-08 LAB — URINALYSIS
Bilirubin, UA: NEGATIVE
Glucose, UA: NEGATIVE
Ketones, UA: NEGATIVE
Nitrite, UA: POSITIVE — AB
RBC, UA: NEGATIVE
Specific Gravity, UA: 1.025 (ref 1.005–1.030)
Urobilinogen, Ur: 0.2 mg/dL (ref 0.2–1.0)
pH, UA: 5 (ref 5.0–7.5)

## 2023-06-08 MED ORDER — AMOXICILLIN-POT CLAVULANATE 500-125 MG PO TABS: 1.0000 | ORAL_TABLET | Freq: Two times a day (BID) | ORAL | 0 refills | Status: DC

## 2023-06-11 ENCOUNTER — Other Ambulatory Visit: Payer: Self-pay

## 2023-06-20 ENCOUNTER — Other Ambulatory Visit: Payer: Self-pay

## 2023-06-20 ENCOUNTER — Telehealth: Payer: Self-pay | Admitting: Physician Assistant

## 2023-06-20 DIAGNOSIS — N390 Urinary tract infection, site not specified: Secondary | ICD-10-CM

## 2023-06-20 NOTE — Telephone Encounter (Signed)
*  STAT* If patient is at the pharmacy, call can be transferred to refill team.   1. Which medications need to be refilled? (please list name of each medication and dose if known)   amoxicillin-clavulanate (AUGMENTIN) 500-125 MG tablet    2. Which pharmacy/location (including street and city if local pharmacy) is medication to be sent to?  [id number] - [place], [place] - [address]      3. Do they need a 30 day or 90 day supply? 14 tablets  Pt is completely out of this medication and states she needs a refill to help with her burning symptoms until her culture labs come back.

## 2023-06-21 LAB — UA/M W/RFLX CULTURE, ROUTINE
Glucose, UA: NEGATIVE
Ketones, UA: NEGATIVE
Protein,UA: NEGATIVE
RBC, UA: NEGATIVE
Specific Gravity, UA: 1.009 (ref 1.005–1.030)
Urobilinogen, Ur: 0.2 mg/dL (ref 0.2–1.0)

## 2023-06-21 LAB — MICROSCOPIC EXAMINATION
Bacteria, UA: NONE SEEN
Casts: NONE SEEN /lpf
RBC, Urine: NONE SEEN /hpf (ref 0–2)

## 2023-06-21 LAB — URINE CULTURE, REFLEX

## 2023-06-21 MED ORDER — AMOXICILLIN-POT CLAVULANATE 500-125 MG PO TABS: 1.0000 | ORAL_TABLET | Freq: Two times a day (BID) | ORAL | 0 refills | Status: AC

## 2023-06-21 NOTE — Telephone Encounter (Signed)
Pharmacy states they didn't get the prescription. Please send another one. Please advise

## 2023-06-21 NOTE — Telephone Encounter (Signed)
Her UA actually looks good. If she is still symptomatic, reasonable to give another 14 tablets to take twice a day.

## 2023-06-21 NOTE — Telephone Encounter (Signed)
Called pharmacy and pharmacy stated that pt pick up medication on 06/08/23, but pt states that she is still having problems. Please address

## 2023-06-21 NOTE — Telephone Encounter (Signed)
Patient's daughter is calling to follow up on this medication. Patient's daughter is requesting we call the patient back.

## 2023-06-21 NOTE — Telephone Encounter (Signed)
Call to patient.  Advised information.  RX sent to pharmacy

## 2023-06-21 NOTE — Telephone Encounter (Signed)
Patient states still having urinary burning. No spasms nor frequency.   Completed ATB last Friday. She states it did improve while on antibiotics but never went completely away.  Felt heaviness and burning throughout the ATB course.  She still has the burning. Heaviness is gone.  She will take AZO today until she can get response from culture response and if new ATB or repeat necessary

## 2023-06-24 LAB — MICROSCOPIC EXAMINATION

## 2023-06-26 NOTE — Progress Notes (Signed)
Thank you :)

## 2023-07-06 NOTE — Progress Notes (Signed)
Great. Thank you Marcelino Duster

## 2023-09-20 ENCOUNTER — Other Ambulatory Visit: Payer: Self-pay

## 2023-09-20 MED ORDER — AMLODIPINE BESYLATE 5 MG PO TABS: 2.5000 mg | ORAL_TABLET | Freq: Every day | ORAL | 2 refills | Status: AC

## 2023-10-09 ENCOUNTER — Other Ambulatory Visit: Payer: Self-pay | Admitting: Cardiovascular Disease

## 2023-10-17 ENCOUNTER — Telehealth: Payer: Self-pay | Admitting: Internal Medicine

## 2023-10-17 MED ORDER — LOSARTAN POTASSIUM 50 MG PO TABS: 50.0000 mg | ORAL_TABLET | Freq: Every day | ORAL | 1 refills | Status: DC

## 2023-10-17 NOTE — Telephone Encounter (Signed)
Refills have been faxed to the patient's requested pharmacy.

## 2023-10-17 NOTE — Telephone Encounter (Signed)
*  STAT* If patient is at the pharmacy, call can be transferred to refill team.   1. Which medications need to be refilled? (please list name of each medication and dose if known)   losartan (COZAAR) 50 MG tablet (Expired)    2. Would you like to learn more about the convenience, safety, & potential cost savings by using the [facility]?   3. Are you open to using the [facility] (Type [facility]. ).  4. Which pharmacy/location (including street and city if local pharmacy) is medication to be sent to?  [facility] - [place], [place] - [address]   5. Do they need a 30 day or 90 day supply?  90 day  Patient stated she still has some of this medication.

## 2023-12-17 ENCOUNTER — Ambulatory Visit: Payer: Medicare Other | Admitting: Pulmonary Disease

## 2023-12-27 ENCOUNTER — Ambulatory Visit: Payer: Medicare Other | Admitting: Pulmonary Disease

## 2024-01-17 ENCOUNTER — Other Ambulatory Visit: Payer: Self-pay | Admitting: Internal Medicine

## 2024-01-30 ENCOUNTER — Ambulatory Visit: Payer: Medicare Other | Admitting: Pulmonary Disease

## 2024-02-14 ENCOUNTER — Encounter: Payer: Self-pay | Admitting: Internal Medicine

## 2024-02-14 ENCOUNTER — Ambulatory Visit: Payer: Medicare Other | Attending: Internal Medicine | Admitting: Internal Medicine

## 2024-02-14 VITALS — BP 122/50 | HR 61 | Ht 62.5 in | Wt 155.0 lb

## 2024-02-14 DIAGNOSIS — I739 Peripheral vascular disease, unspecified: Secondary | ICD-10-CM | POA: Insufficient documentation

## 2024-02-14 DIAGNOSIS — I10 Essential (primary) hypertension: Secondary | ICD-10-CM | POA: Insufficient documentation

## 2024-02-14 DIAGNOSIS — I2510 Atherosclerotic heart disease of native coronary artery without angina pectoris: Secondary | ICD-10-CM | POA: Diagnosis present

## 2024-02-14 DIAGNOSIS — E782 Mixed hyperlipidemia: Secondary | ICD-10-CM | POA: Diagnosis present

## 2024-02-14 NOTE — Patient Instructions (Addendum)
 Medication Instructions:   No changes   *If you need a refill on your cardiac medications before your next appointment, please call your pharmacy*   Lab Work:  Not needed  If you have labs (blood work) drawn today and your tests are completely normal, you will receive your results only by: MyChart Message (if you have MyChart) OR A paper copy in the mail If you have any lab test that is abnormal or we need to change your treatment, we will call you to review the results.   Testing/Procedures:    Follow-Up: At [facility], you and your health needs are our priority.  As part of our continuing mission to provide you with exceptional heart care, we have created designated Provider Care Teams.  These Care Teams include your primary Cardiologist (physician) and Advanced Practice Providers (APPs -  Physician Assistants and Nurse Practitioners) who all work together to provide you with the care you need, when you need it.  We recommend signing up for the patient portal called "MyChart".  Sign up information is provided on this After Visit Summary.  MyChart is used to connect with patients for Virtual Visits (Telemedicine).  Patients are able to view lab/test results, encounter notes, upcoming appointments, etc.  Non-urgent messages can be sent to your provider as well.   To learn more about what you can do with MyChart, go to [email or web address].    Your next appointment:   12 month(s)  The format for your next appointment:   In Person  Provider:   Chrystie Nose, MD    s

## 2024-02-14 NOTE — Progress Notes (Signed)
 OFFICE NOTE  Chief Complaint:  Routine follow-up  Primary Care Physician: Gwenlyn Found, MD  HPI:  Kristin Shaffer is an 89 year old Caucasian female with a history of hyperlipidemia and hypothyroidism. She was seen by myself on March 20, 2013, and at that time was found to be in hypertensive urgency with a blood pressure of 230/94. She was admitted to [facility] for blood pressure control and cardiac catheterization for unstable angina. She did in fact have a non-ST-elevation myocardial infarction with a mildly elevated troponin of 0.67. She underwent coronary angiography, which revealed a subtotal occlusion of the mid right coronary artery. She then underwent a successful complex PCI of the entire mid RCA encompassing a long tubular 40% to 60% lesion that preceded 2 tandem 95% and 99% subtotal occlusions. This was completed using 3 overlapping Xience Xpedition drug-eluting stents. She was placed on aspirin and Brilinta, and was discharged. She subsequently returned to [facility] on April 26 to the emergency room with chest pain. Patient states that it was a "sharp, stabbing, and burning" pain. It was 10 out of 10 in intensity. She took 4 baby aspirin at home, but did not have any nitroglycerin. EMS gave her one, which provided her some relief, and midway to the ER, they gave her another one, which essentially resolved the pain. Patient reports feeling weak, but that is improving compared to when she was discharged. She has had no chest pain since the episode on the 26th. She was provided with a prescription at the ER for sublingual nitroglycerin. She did undergo an echocardiogram which showed preserved LV systolic function and an EF of 60-65%. There was stage I diastolic dysfunction and aortic sclerosis with mild central aortic regurgitation. Otherwise no significant abnormalities.  She also had carotid and renal Dopplers. The carotid Dopplers indicated a small amount of bilateral  plaque. The renal Dopplers did not indicate any renal artery stenosis.  Recently she had lipid profile performed which showed a particle number of 1783, with LDL C. of 143.  Based on this I recommended increasing her Lipitor from 20-40 mg.  She did make this change, but as noted pain and weakness in her joints, especially when she wakes up in the morning which takes about 30 minutes to improve. This sounds a lot like arthritis, but she really feels it is related to her cholesterol medicine.  Overall, at her last office visit I felt she was doing fairly well. Unfortunately about 5 days after that visit she started to have acute onset chest discomfort with radiation of pain into her left arm. She presented to the emergency department and was found to have unstable angina. Cardiac catheterization was performed which demonstrated 99% in-stent stenosis at the previously placed mid RCA stent.  I am interested as to why she presented with acute unstable angina, and not progressive anginal symptoms with exertion. She certainly had absolutely no chest pain or arm pain just a few days prior to the presentation, which is more consistent with an acute coronary syndrome.  Kristin Shaffer returns today for follow-up. She reports that she has had to take nitroglycerin twice for chest discomfort. Wants while riding in a car and another time at rest. Both episodes sound more like reflux however she did have improvement after about 15 minutes. She recently is been having problems with bruising on aspirin and Effient and is concerned about being on strong blood thinners. Her last stent placement was in 11/2013. Dual antiplatelet therapy was  recommended for at least a year.  I saw Kristin Shaffer back in the office today. She is complaining of some weakness and fatigue. She also soreness in her muscles, particularly when she gets up in the morning. Some of it seems to be joint soreness which is concerning for osteoarthritis. She clearly  has signs of osteoarthritis in her DIP joints and there is some crepitus in her knees. It is however possible that some of her symptoms could be related to Lipitor. She is on combination Lipitor and fenofibrate, which is slightly higher risk of developing myalgias. Unfortunately, her cholesterol has been really well controlled.   Kristin Shaffer returns today for follow-up. She recently was planting some irises in her lawn and is complaining of some back pain and spasm. In the past she had some relief from Flexeril, however does not currently have any. She denies any cardiac chest pain. She does get some occasional reflux symptoms associated with certain foods.  Kristin Shaffer see him back in the office again today. She has some intermittent chest discomfort. The symptoms seem atypical and are not like the chest and left arm pain she had in the past. She recently saw her primary care provider who noted some possible fluid in the right lung base. I do hear some faint crackles today however it sounds to me more like atelectasis or even fibrosis. She reports some shortness of breath. She also thinks she may be having reflux symptoms but is hesitant to take omeprazole because she says that she feels that could cause her chest pain. I reassured her that it would not.  04/14/2016  Kristin Shaffer was seen back today in follow-up for review of her echocardiogram. This is essentially unchanged compared to her prior study in 2014. Her pulmonary pressure is elevated at 45 mmHg. LVEF is 60-65%. There is mild diastolic dysfunction. Although her primary pressure is elevated, this is not likely a significant cause of her shortness of breath given the mild elevation. I am concerned however that she may have underlying pulmonary fibrosis. There is evidence of this by chest x-ray in 2015 and she has some dry crackles on exam. I do not believe there is interstitial edema. She is also complaining now of right flank pain. She has some CVA  tenderness but has had no fever, chills or shaking chills. She recently had 2 UTIs apparently which eventually improved with Cipro. She denies any chest pain.  05/22/2016  Kristin Shaffer returns today for follow-up. She underwent CT scan of the chest due to some interstitial changes on her chest x-ray and right flank/lower right posterior chest pain. She reports that this chest pain has improved and resolved although she still notes some shortness of breath with exertion. The CT scan shows a pattern of rather widespread groundglass attenuation most evident in the mid to lower lung fields. There are patchy areas of mild subpleural reticulation noted as well. This is concerning for either non-specific interstitial pneumonia or possibly IPF. Based on these findings, I would recommend a pulmonary evaluation. There is no evidence of a pneumonia. Her BNP is low at 40 and I did not suspect this is congestive heart failure or pulmonary edema. Finally she underwent abdominal ultrasound which demonstrated moderate aortoiliac atherosclerosis which will need follow-up annually.  01/15/2018  Kristin Shaffer returns today for follow-up. She's recently had a number of medical problems include a hospitalization for intracerebral hemorrhage. This was a result of trauma falling down stairs. Fortunately this resolved completely. She  did see Dr. Kathrin Penner for possible nonspecific interstitial pneumonia but he feels that her radiographic findings an abnormal PFTs may be attributable to chronic aspiration with GERD. She does report shortness of breath with moderate exertion. Blood pressure today was initially elevated 188/71 however recheck was 120/68. She was switched from lisinopril to losartan for persistent cough however is noted no change. She is on over-the-counter H2 blocker for her reflux.  09/28/2017  Kristin Shaffer was seen today in follow-up.  Overall she seems to be doing well.  She saw Dr. Kendrick Fries for pulmonary fibrosis.  He is  thought that this may be related to prior pesticide exposure or silent aspiration.  He does not feel like he has changed significantly and may not be IPF.  She denies any recurrent chest pain.  She is recovered from her intracerebral hemorrhage.  She is now back on aspirin and Plavix.  She has not had recent lipid testing.  She is n.p.o. today.  Blood pressure is top normal 140/78.  08/29/2018  Kristin Shaffer is seen today in follow-up.  Overall she seems to be doing well.  She had seen Dr. Kendrick Fries for pulmonary fibrosis and he felt that she did not need any further therapies but wanted to follow-up with her.  She does not have an appointment I encouraged her to reach out.  She denies any chest pain.  Blood pressure is well controlled today 122/64.  EKG shows sinus rhythm.  10/03/2019  Kristin Shaffer returns today for follow-up.  In general she feels well.  She denies any worsening shortness of breath or chest pain.  Blood pressure was initially elevated today however came down to 140/64.  She had lipid testing last year which showed total cholesterol 171, triglycerides 144, HDL 65 and LDL 77.  Her target LDL is less than 70.  She is on dual antiplatelet therapy with aspirin and Plavix.  She also takes levothyroxine and has not had a recheck recently.  She is inquiring about getting lab work today.  EKG shows sinus rhythm at 60.  12/27/2020  Kristin Shaffer is seen today in follow-up.  Overall she is doing fairly well.  She is describing arthritis in her right leg however is not improved after the injection.  She now has to walk with a cane.  She had one episode of chest discomfort but it was brief.  She said she took 2 nitroglycerin in bed without any improvement and then went to sleep.  Overall does not sound cardiac.  She follows with Dr. Isaiah Serge and pulmonary for fibrosis.  Blood pressure is good today.  She is overdue for repeat lipid and thyroid studies from her PCP.  02/17/2022  Kristin Shaffer returns today for follow-up.  She  has been having some issues walking.  She says is difficult for her to get around.  She does have some lower extremity arthritis but notes some pain in her legs when walking certain distances that improves at rest.  She does have a history of moderate aortoiliac disease back in 2018.  Recent Dopplers have not been very good quality.  She is however overdue for reimaging of that.  She has had stable pulmonary fibrosis and follows with Dr. Isaiah Serge.  Her lipids in January were not well controlled, this was due to a statin holiday which was supposed to be for 2 weeks but ended up being over a year.  Her cholesterol had then gone up to a total 309, triglycerides 203, HDL 65 and LDL 205.  At that  point she was noted to not have had any improvement in her leg pain and therefore was restarted on her medications by her PCP.  03/22/2023  Kristin Shaffer is seen today in follow-up.  She saw Azalee Course, PA-C in the fall.  Repeat lower extremity arterial Doppler showed severe left SFA stenosis.  There was a discussion between him and Dr. Allyson Sabal about possible intervention.  It was felt that she was not a good candidate for that and most of her symptoms were right leg pain not left leg.  They started Pletal but she noted no difference in the medicine and then she stopped it.  She still is having difficulty getting around.  She reportedly has diffuse arthritis.  She is scheduled to see Dr. Dierdre Forth soon.  Her blood pressure was quite elevated today 192/73.  She says at home and generally it is around 170 systolic which is actually poorly controlled.  She is on a combination of chlorthalidone and losartan.  02/14/2024  Kristin Shaffer returns today for follow-up.  Overall she is doing fairly well.  Last summer she saw Azalee Course, PA-C again.  Blood pressure actually was a little low and her amlodipine was decreased to 2.5 mg daily.  She has been having issues with UTIs.  In fact they had recurred frequently enough that she is now on suppressive  therapy.  She has also developed worsening knee pain and noted to have no meniscus in the right knee.  She is now on some chronic pain treatment with Cymbalta and seems to be doing better with that.  Blood pressure is excellent today 122/50.  No chest pain or worsening shortness of breath.  No symptoms concerning for claudication.  EKG shows a normal sinus rhythm.   PMHx:  Past Medical History:  Diagnosis Date   Anxiety    Arthritis    "different places; not bad" (03/20/2013)   Coronary artery disease    Exertional shortness of breath    GERD (gastroesophageal reflux disease)    High cholesterol    Hypertension    Hypothyroidism    Myocardial infarction [facility])    "Dr's saw evidence I might have had a heart attack" (03/20/2013)   NSTEMI (non-ST elevated myocardial infarction) (HCC) 03/20/2013   cath - mid RCA   Pneumonia 2012   Skin cancer    "both legs; right arm" (03/20/2013)    Past Surgical History:  Procedure Laterality Date   APPENDECTOMY  2003   BREAST BIOPSY Bilateral 1990s   "total of 5; 2 on one side, 3 on the other; all benign" (03/20/2013)   CATARACT EXTRACTION W/ INTRAOCULAR LENS  IMPLANT, BILATERAL Bilateral ~ 2008   CORONARY ANGIOPLASTY  12/08/2013   CORONARY ANGIOPLASTY WITH STENT PLACEMENT  03/20/2013   NSTEMI - subtotal occlusion of mid RCA - PCI of mid RCA of long tubular 40-60% lesion - 2 tandem 95-99% subtotal occlusions - 3 overlapping Xience Xpedition DES (Dr. Ranae Palms)    DILATION AND CURETTAGE OF UTERUS  1956?   LACERATION REPAIR Right 03/24/2019   Procedure: COMPLEX FACIAL LACERATIONS;  Surgeon: Osborn Coho, MD;  Location: [facility] OR;  Service: ENT;  Laterality: Right;   LEFT HEART CATHETERIZATION WITH CORONARY ANGIOGRAM N/A 03/20/2013   Procedure: LEFT HEART CATHETERIZATION WITH CORONARY ANGIOGRAM;  Surgeon: Thurmon Fair, MD;  Location: [facility] CATH LAB;  Service: Cardiovascular;  Laterality: N/A;   LEFT HEART CATHETERIZATION WITH CORONARY ANGIOGRAM N/A 12/08/2013    Procedure: LEFT HEART CATHETERIZATION WITH CORONARY ANGIOGRAM;  Surgeon: Casimiro Needle  Karren Burly, MD;  Location: [facility] CATH LAB;  Service: Cardiovascular;  Laterality: N/A;   SKIN CANCER EXCISION     "1 off right arm; 2 off each leg" (03/20/2013)   TRANSTHORACIC ECHOCARDIOGRAM  03/2013   EF 60-65%, grade 1 diastolic dysfunction; mild MR; calcifed MV annulus; LA mildly dilated    FAMHx:  Family History  Problem Relation Age of Onset   Dementia Mother    Lung cancer Father    CAD Father 30   Cancer Brother     SOCHx:   reports that she has never smoked. She has never used smokeless tobacco. She reports that she does not drink alcohol and does not use drugs.  ALLERGIES:  Allergies  Allergen Reactions   Hydrocodone-Acetaminophen Nausea And Vomiting   Omeprazole Other (See Comments)    Made acid reflux worse   Zocor [Simvastatin] Other (See Comments)    Trouble Walking, Leg weakness and cramping   Latex Hives and Rash    ROS: Pertinent items noted in HPI and remainder of comprehensive ROS otherwise negative.  HOME MEDS: Current Outpatient Medications  Medication Sig Dispense Refill   amLODipine (NORVASC) 5 MG tablet Take 0.5 tablets (2.5 mg total) by mouth daily. 45 tablet 2   amoxicillin-clavulanate (AUGMENTIN) 500-125 MG tablet Take 1 tablet by mouth in the morning and at bedtime. 14 tablet 0   aspirin EC 81 MG tablet Take 81 mg by mouth daily.     atorvastatin (LIPITOR) 20 MG tablet Take by mouth.     betamethasone dipropionate 0.05 % cream Apply 1 Application topically 2 (two) times daily.     CALCIUM-VITAMIN D PO Take 1 tablet by mouth daily.     cefaclor (CECLOR) 250 MG capsule Take by mouth.     chlorthalidone (HYGROTON) 25 MG tablet TAKE 1 TABLET(25 MG) BY MOUTH DAILY FOR BLOOD PRESSURE     cilostazol (PLETAL) 50 MG tablet TAKE 1 TABLET TWICE DAILY 180 tablet 3   clopidogrel (PLAVIX) 75 MG tablet TAKE 1 TABLET EVERY DAY 90 tablet 1   DULoxetine (CYMBALTA) 20 MG capsule Take 20  mg by mouth daily.     ezetimibe (ZETIA) 10 MG tablet TAKE 1 TABLET EVERY DAY 90 tablet 10   levothyroxine (SYNTHROID) 125 MCG tablet Take by mouth.     LUTEIN PO Take by mouth.     Magnesium Oxide 250 MG TABS Take 1 tablet by mouth daily.     metoprolol tartrate (LOPRESSOR) 25 MG tablet TAKE 1/2 TABLET TWICE DAILY 90 tablet 3   Multiple Vitamins-Minerals (MULTIVITAMIN ADULT PO) Take by mouth.     nitroGLYCERIN (NITROSTAT) 0.4 MG SL tablet Place 1 tablet (0.4 mg total) under the tongue every 5 (five) minutes as needed for chest pain. 25 tablet 6   predniSONE (DELTASONE) 10 MG tablet Take 10 mg by mouth 2 (two) times daily.     losartan (COZAAR) 50 MG tablet Take 1 tablet (50 mg total) by mouth at bedtime. 90 tablet 1   No current facility-administered medications for this visit.    LABS/IMAGING: No results found for this or any previous visit (from the past 48 hours). No results found.  VITALS: BP (!) 122/50   Pulse 61   Ht 5' 2.5" (1.588 m)   Wt 155 lb (70.3 kg)   SpO2 98%   BMI 27.90 kg/m   EXAM: General appearance: alert and no distress Neck: no carotid bruit and no JVD Lungs: clear to auscultation bilaterally Heart: regular rate  and rhythm, S1, S2 normal, no murmur, click, rub or gallop Abdomen: soft, non-tender; bowel sounds normal; no masses,  no organomegaly Extremities: extremities normal, atraumatic, no cyanosis or edema Pulses: 2+ and symmetric Skin: Skin color, texture, turgor normal. No rashes or lesions Neurologic: Grossly normal  EKG: EKG Interpretation Date/Time:  Thursday February 14 2024 14:48:16 EDT Ventricular Rate:  61 PR Interval:  142 QRS Duration:  100 QT Interval:  400 QTC Calculation: 402 R Axis:   1  Text Interpretation: Normal sinus rhythm Moderate voltage criteria for LVH, may be normal variant ( R in aVL , Cornell product ) When compared with ECG of 24-Mar-2019 12:54, No significant change since last tracing Confirmed by Zoila Shutter [phone])  on 02/14/2024 3:00:17 PM    ASSESSMENT: Coronary artery disease status post recent and STEMI, PCI to the RCA with 4 overlapping Xience drug-eluting stents - now with recent UA and ISR (2015) Hypertension Hypothyroidism GERD Dyslipidemia - now on lipitor, zetia and fenofibrate Low back spasm Progressive Shaffer - possible pulmonary fibrosis on CT Pulmonary hypertension  Right flank pain Moderate aorto-iliac atherosclerosis Recent traumatic ICH  PLAN: 1.   Kristin Shaffer is doing better with better pain control and I think a lot of her symptoms are related to neuropathy.  She is not on Cymbalta and sleeping better as well.  Blood pressure is well-controlled.  No chest pain, no claudication symptoms.  Shortness of breath is fairly stable and not worsened.  Overall she is doing fairly well.  Will plan to continue her current therapies.  Follow-up with me or APP in 1 year or sooner as necessary.  Kristin Nose, MD, [facility], FACP  [place]  [facility]  Medical Director of the [facility]  Card[facility]ovascular [facility] Diplomate of the American Board of Clinical Lipidology Attending Cardiologist  Direct Dial: [phone]  Fax: [phone]  Website:  [email or web address]   Lisette Abu Suezette Lafave 02/14/2024, 3:00 PM

## 2024-04-05 ENCOUNTER — Other Ambulatory Visit: Payer: Self-pay | Admitting: Internal Medicine

## 2024-06-19 ENCOUNTER — Other Ambulatory Visit: Payer: Self-pay | Admitting: Internal Medicine

## 2024-10-02 ENCOUNTER — Ambulatory Visit (INDEPENDENT_AMBULATORY_CARE_PROVIDER_SITE_OTHER): Admitting: Pulmonary Disease

## 2024-10-02 VITALS — BP 110/80 | HR 69 | Temp 97.6°F | Ht 64.0 in | Wt 168.0 lb

## 2024-10-02 DIAGNOSIS — J849 Interstitial pulmonary disease, unspecified: Secondary | ICD-10-CM

## 2024-10-02 NOTE — Progress Notes (Signed)
 Kristin Shaffer    [id number]    [birth year]  Primary Care Physician:Eksir, Lucie LABOR, MD  Referring Physician: Leila Lucie LABOR, MD [id number] [place],  [place] [address]  Chief complaint:   Follow-up for interstitial lung disease lung disease  HPI: 89 year old with history of coronary artery disease, GERD, hypothyroidism, hypertension Previously evaluated by Dr. Alaine in the pulmonary clinic for mild CT findings of ILD suggestive of NSIP.  Serologies were negative. Conservative management advised  She has been referred back to pulmonary clinic for evaluation as her cardiologist and primary care noted crackles on lung auscultation.  She states that she is doing well, no issues with breathing. Diagnosed with psoriasis of the scalp on the occipital area and is using triamcinolone cream  Interim history: Discussed the use of AI scribe software for clinical note transcription with the patient, who gave verbal consent to proceed. History of Present Illness Kristin Shaffer is a 89 year old female with interstitial lung disease who presents with chest tightness. She is accompanied by her daughter. She was referred by her heart doctor for evaluation of her lung condition.  Chest tightness - Intermittent chest tightness, predominantly occurring at night - Partial relief with drinking water  Interstitial lung disease - Diagnosed in 2023 - Lung function tests were normal at time of diagnosis - CT scan demonstrated subtle interstitial changes - Unable to follow up due to COVID-19 and other health issues  Environmental exposures - Use of a down comforter, which may contribute to hypersensitivity reactions affecting her lung condition   Relevant pulmonary history Pets: Had a dog before.  No pets currently Occupation: Housewife Exposures: No mold, hot tub, Jacuzzi.  She had feather pillows in the house in the remote past but exposure was not significant Smoking history:  Never smoker Travel history: No significant travel history Relevant family history: No family history of lung disease  Outpatient Encounter Medications as of 10/02/2024  Medication Sig   amLODipine  (NORVASC ) 5 MG tablet Take 0.5 tablets (2.5 mg total) by mouth daily.   amoxicillin -clavulanate (AUGMENTIN ) 500-125 MG tablet Take 1 tablet by mouth in the morning and at bedtime.   aspirin  EC 81 MG tablet Take 81 mg by mouth daily.   atorvastatin  (LIPITOR) 20 MG tablet Take by mouth.   betamethasone dipropionate 0.05 % cream Apply 1 Application topically 2 (two) times daily.   CALCIUM -VITAMIN D PO Take 1 tablet by mouth daily.   cefaclor (CECLOR) 250 MG capsule Take by mouth.   chlorthalidone (HYGROTON) 25 MG tablet TAKE 1 TABLET(25 MG) BY MOUTH DAILY FOR BLOOD PRESSURE   cilostazol  (PLETAL ) 50 MG tablet TAKE 1 TABLET TWICE DAILY   clopidogrel  (PLAVIX ) 75 MG tablet TAKE 1 TABLET EVERY DAY   DULoxetine (CYMBALTA) 20 MG capsule Take 20 mg by mouth daily.   ezetimibe  (ZETIA ) 10 MG tablet TAKE 1 TABLET EVERY DAY   levothyroxine  (SYNTHROID ) 125 MCG tablet Take by mouth.   losartan  (COZAAR ) 50 MG tablet TAKE 1 TABLET AT BEDTIME   LUTEIN PO Take by mouth.   Magnesium  Oxide 250 MG TABS Take 1 tablet by mouth daily.   metoprolol  tartrate (LOPRESSOR ) 25 MG tablet TAKE 1/2 TABLET TWICE DAILY   Multiple Vitamins-Minerals (MULTIVITAMIN ADULT PO) Take by mouth.   nitroGLYCERIN  (NITROSTAT ) 0.4 MG SL tablet Place 1 tablet (0.4 mg total) under the tongue every 5 (five) minutes as needed for chest pain.   predniSONE (DELTASONE) 10  MG tablet Take 10 mg by mouth 2 (two) times daily.   No facility-administered encounter medications on file as of 10/02/2024.    Vitals:   10/02/24 1335  BP: 110/80  Pulse: 69  Temp: 97.6 F (36.4 C)  Height: 5' 4 (1.626 m)  Weight: 168 lb (76.2 kg)  SpO2: 94%  TempSrc: Oral  BMI (Calculated): 28.82     Physical Exam GEN: No acute distress. CV: Regular rate and rhythm,  no murmurs. LUNGS: Clear to auscultation bilaterally, normal respiratory effort. SKIN JOINTS: Warm and dry, no rash.    Data Reviewed: Imaging: CT high-resolution 12/22/2016-subtle changes of groundglass attenuation, bronchiectasis with moderate air trapping.  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 I have reviewed the images personally.  PFTs: 08/22/2018 FVC 1.94 [80%], FEV1 1.80 [100%], F/F 93, TLC 4.56 [88%], DLCO 17.34 [69%] Isolated reduction of diffusion capacity  02/28/2021 FVC 2.06 [89%], FEV1 1.93 [113%], F/F 93, TLC 4.21 [82%], DLCO 17.45 [93%] Normal pulmonary function test  05/18/2022 FVC 2.01 [88%], FEV1 1.85 [111%], F/F92, TLC 4.31 [84%], DLCO 13.69 [73%] Normal test  Labs: CTD serologies 06/16/2016- negative CTD serologies 05/18/2022-ANA 1: 40, nuclear speckled.  Negative CCP, rheumatoid factor.  Negative hypersensitivity pneumonitis panel  Assessment & Plan Interstitial lung disease Mild interstitial lung disease with subtle changes in alternate pattern on CT in 2023. She does not have any symptoms of connective tissue disease, CTD serologies are negative except for borderline elevation in ANA. This may represent hypersensitivity pneumonitis and she tells me that she has a down comforter that she uses during the winter months.  At last visit we told her to get rid of it but has kept it since she likes the warmth.  Positive exposure to agricultural allergens noted. No aggressive treatments like biopsy recommended due to age. - Ordered chest CT to evaluate current status of interstitial lung disease - Will communicate CT results via MyChart or telephone - Will schedule follow-up appointment in three months  Plan/Recommendations: High-res CT chest  Lonna Coder MD [facility] 10/02/2024, 2:01 PM  CC:  Leila Lucie LABOR, MD

## 2024-10-02 NOTE — Patient Instructions (Signed)
  VISIT SUMMARY: You visited us  today due to chest tightness and to evaluate your interstitial lung disease. We discussed your symptoms and environmental exposures that might be affecting your condition.  YOUR PLAN: INTERSTITIAL LUNG DISEASE: You have mild interstitial lung disease with subtle changes on your CT scan from 2023. Your lung function tests were normal at that time. We suspect that your use of a down comforter or past exposure to agricultural allergens might be contributing to your condition. -We have ordered a chest CT to evaluate the current status of your interstitial lung disease. -We will communicate the CT results to you via MyChart or telephone. -We will schedule a follow-up appointment in three months.  CHEST TIGHTNESS: You have been experiencing intermittent chest tightness, especially at night, which is partially relieved by drinking water. There was no acute distress during your examination today. -We have ordered a chest CT to evaluate the cause of your chest tightness. -We will communicate the CT results to you via MyChart or telephone. -We will schedule a follow-up appointment in three months.

## 2024-10-09 ENCOUNTER — Ambulatory Visit (HOSPITAL_COMMUNITY)

## 2024-10-14 ENCOUNTER — Ambulatory Visit (HOSPITAL_BASED_OUTPATIENT_CLINIC_OR_DEPARTMENT_OTHER)
Admission: RE | Admit: 2024-10-14 | Discharge: 2024-10-14 | Disposition: A | Discharge status: Home / Self Care | Source: Ambulatory Visit | Attending: Pulmonary Disease | Admitting: Pulmonary Disease

## 2024-10-14 DIAGNOSIS — J849 Interstitial pulmonary disease, unspecified: Secondary | ICD-10-CM | POA: Diagnosis present

## 2024-10-21 ENCOUNTER — Ambulatory Visit: Payer: Self-pay | Admitting: Pulmonary Disease

## 2025-01-14 ENCOUNTER — Ambulatory Visit: Admitting: Pulmonary Disease
